# Patient Record
Sex: Male | Born: 1945
Health system: Southern US, Community
[De-identification: ages and names within clinical notes are randomized; demographics above are authoritative.]

## PROBLEM LIST (undated history)

## (undated) DIAGNOSIS — R059 Cough, unspecified: Secondary | ICD-10-CM

## (undated) DIAGNOSIS — K219 Gastro-esophageal reflux disease without esophagitis: Secondary | ICD-10-CM

## (undated) DIAGNOSIS — J45909 Unspecified asthma, uncomplicated: Secondary | ICD-10-CM

## (undated) DIAGNOSIS — K469 Unspecified abdominal hernia without obstruction or gangrene: Secondary | ICD-10-CM

## (undated) DIAGNOSIS — K2270 Barrett's esophagus without dysplasia: Secondary | ICD-10-CM

## (undated) DIAGNOSIS — G4730 Sleep apnea, unspecified: Secondary | ICD-10-CM

## (undated) DIAGNOSIS — D649 Anemia, unspecified: Secondary | ICD-10-CM

## (undated) DIAGNOSIS — C801 Malignant (primary) neoplasm, unspecified: Secondary | ICD-10-CM

## (undated) DIAGNOSIS — G7000 Myasthenia gravis without (acute) exacerbation: Secondary | ICD-10-CM

## (undated) DIAGNOSIS — I10 Essential (primary) hypertension: Secondary | ICD-10-CM

## (undated) DIAGNOSIS — Z8719 Personal history of other diseases of the digestive system: Secondary | ICD-10-CM

## (undated) DIAGNOSIS — R05 Cough: Secondary | ICD-10-CM

## (undated) DIAGNOSIS — R600 Localized edema: Secondary | ICD-10-CM

## (undated) DIAGNOSIS — H269 Unspecified cataract: Secondary | ICD-10-CM

## (undated) DIAGNOSIS — R0602 Shortness of breath: Secondary | ICD-10-CM

## (undated) HISTORY — DX: Localized edema: R60.0

## (undated) HISTORY — PX: BACK SURGERY: SHX140

## (undated) HISTORY — DX: Myasthenia gravis without (acute) exacerbation: G70.00

## (undated) HISTORY — DX: Sleep apnea, unspecified: G47.30

## (undated) HISTORY — PX: EYE SURGERY: SHX253

## (undated) HISTORY — DX: Barrett's esophagus without dysplasia: K22.70

## (undated) HISTORY — DX: Personal history of other diseases of the digestive system: Z87.19

## (undated) HISTORY — DX: Unspecified cataract: H26.9

## (undated) HISTORY — DX: Unspecified asthma, uncomplicated: J45.909

## (undated) HISTORY — DX: Cough, unspecified: R05.9

## (undated) HISTORY — PX: COLONOSCOPY W/ POLYPECTOMY: SHX1380

## (undated) HISTORY — DX: Cough: R05

## (undated) HISTORY — DX: Shortness of breath: R06.02

## (undated) HISTORY — PX: LUMBAR LAMINECTOMY: SHX95

## (undated) HISTORY — DX: Gastro-esophageal reflux disease without esophagitis: K21.9

---

## 2001-02-08 ENCOUNTER — Ambulatory Visit (HOSPITAL_COMMUNITY): Admission: RE | Admit: 2001-02-08 | Discharge: 2001-02-08 | Payer: Self-pay | Admitting: Gastroenterology

## 2001-02-08 ENCOUNTER — Encounter (INDEPENDENT_AMBULATORY_CARE_PROVIDER_SITE_OTHER): Payer: Self-pay | Admitting: *Deleted

## 2004-08-03 ENCOUNTER — Ambulatory Visit: Payer: Self-pay | Admitting: Gastroenterology

## 2006-03-09 ENCOUNTER — Ambulatory Visit: Payer: Self-pay | Admitting: Internal Medicine

## 2007-03-26 ENCOUNTER — Ambulatory Visit: Payer: Self-pay | Admitting: Gastroenterology

## 2007-04-04 ENCOUNTER — Ambulatory Visit: Payer: Self-pay | Admitting: Gastroenterology

## 2007-05-09 ENCOUNTER — Ambulatory Visit: Payer: Self-pay | Admitting: Gastroenterology

## 2007-05-09 LAB — CONVERTED CEMR LAB
Albumin: 3.7 g/dL (ref 3.5–5.2)
Basophils Relative: 1.1 % — ABNORMAL HIGH (ref 0.0–1.0)
Bilirubin, Direct: 0.1 mg/dL (ref 0.0–0.3)
Hemoglobin: 15 g/dL (ref 13.0–17.0)
Lymphocytes Relative: 28 % (ref 12.0–46.0)
MCHC: 35.2 g/dL (ref 30.0–36.0)
Monocytes Absolute: 0.5 10*3/uL (ref 0.2–0.7)
Monocytes Relative: 9.8 % (ref 3.0–11.0)
Neutro Abs: 3.2 10*3/uL (ref 1.4–7.7)
Neutrophils Relative %: 57.7 % (ref 43.0–77.0)
Total Bilirubin: 1 mg/dL (ref 0.3–1.2)

## 2007-05-10 ENCOUNTER — Ambulatory Visit: Payer: Self-pay | Admitting: Gastroenterology

## 2007-05-10 ENCOUNTER — Encounter: Payer: Self-pay | Admitting: Gastroenterology

## 2007-05-16 ENCOUNTER — Ambulatory Visit (HOSPITAL_COMMUNITY): Admission: RE | Admit: 2007-05-16 | Discharge: 2007-05-16 | Payer: Self-pay | Admitting: Gastroenterology

## 2007-06-01 ENCOUNTER — Ambulatory Visit: Payer: Self-pay | Admitting: Gastroenterology

## 2008-05-08 ENCOUNTER — Ambulatory Visit: Payer: Self-pay | Admitting: Gastroenterology

## 2008-05-13 ENCOUNTER — Encounter: Payer: Self-pay | Admitting: Gastroenterology

## 2008-05-13 ENCOUNTER — Ambulatory Visit: Payer: Self-pay | Admitting: Gastroenterology

## 2008-05-16 ENCOUNTER — Encounter: Payer: Self-pay | Admitting: Gastroenterology

## 2008-05-22 ENCOUNTER — Telehealth: Payer: Self-pay | Admitting: Gastroenterology

## 2009-05-08 ENCOUNTER — Ambulatory Visit: Payer: Self-pay | Admitting: Gastroenterology

## 2009-05-08 DIAGNOSIS — K219 Gastro-esophageal reflux disease without esophagitis: Secondary | ICD-10-CM | POA: Insufficient documentation

## 2009-05-08 DIAGNOSIS — R141 Gas pain: Secondary | ICD-10-CM | POA: Insufficient documentation

## 2009-05-08 DIAGNOSIS — K2270 Barrett's esophagus without dysplasia: Secondary | ICD-10-CM

## 2009-05-08 DIAGNOSIS — R143 Flatulence: Secondary | ICD-10-CM

## 2009-05-08 DIAGNOSIS — K5730 Diverticulosis of large intestine without perforation or abscess without bleeding: Secondary | ICD-10-CM | POA: Insufficient documentation

## 2009-05-08 DIAGNOSIS — R142 Eructation: Secondary | ICD-10-CM

## 2009-05-11 ENCOUNTER — Ambulatory Visit: Payer: Self-pay | Admitting: Gastroenterology

## 2009-05-14 ENCOUNTER — Encounter: Payer: Self-pay | Admitting: Gastroenterology

## 2009-05-18 ENCOUNTER — Ambulatory Visit (HOSPITAL_COMMUNITY): Admission: RE | Admit: 2009-05-18 | Discharge: 2009-05-18 | Payer: Self-pay | Admitting: Gastroenterology

## 2009-05-19 ENCOUNTER — Encounter: Payer: Self-pay | Admitting: Gastroenterology

## 2009-06-15 ENCOUNTER — Ambulatory Visit: Payer: Self-pay | Admitting: Gastroenterology

## 2009-06-16 ENCOUNTER — Telehealth: Payer: Self-pay | Admitting: Gastroenterology

## 2009-06-23 ENCOUNTER — Telehealth: Payer: Self-pay | Admitting: Gastroenterology

## 2010-06-01 NOTE — Progress Notes (Signed)
Summary: Medication refill  Medications Added DEXILANT 60 MG CPDR (DEXLANSOPRAZOLE) 1 by mouth once daily       Phone Note Call from Patient Call back at [phone]   Caller: Patient Call For: Dr. Arlyce Dice Reason for Call: Refill Medication Summary of Call: Pt is needing his Dexliant refilled Initial call taken by: Karna Christmas,  June 23, 2009 3:12 PM  Follow-up for Phone Call        Called pt to inform, he wanted 90 day supply sent to his mail order pharmacy Follow-up by: Merri Ray CMA [name]),  June 23, 2009 3:47 PM    New/Updated Medications: DEXILANT 60 MG CPDR (DEXLANSOPRAZOLE) 1 by mouth once daily Prescriptions: DEXILANT 60 MG CPDR (DEXLANSOPRAZOLE) 1 by mouth once daily  #90 x 4   Entered by:   Merri Ray CMA (AAMA)   Authorized by:   Louis Meckel MD   Signed by:   Merri Ray CMA (AAMA) on 06/23/2009   Method used:   Electronically to        [facility]* (mail-order)       [address] [place], [place]  [address]       Ph: [phone]       Fax: [phone]   RxID:   [id number]

## 2010-06-01 NOTE — Letter (Signed)
Summary: Generic Letter  [facility]  [address] [place], [place] [address]   Phone: [phone]  Fax: [phone]    05/19/2009  LELDON STEEGE [address] [place], [place]  [address]  Dear Mr. NGO,  Your gastric emptying scan did not show any remarkable findings.  Please continue with the recommendations previously discussed.  Should you have any further questions or immediate concers, feel free to contact me.  Sincerely,  Barbette Hair. Arlyce Dice, M.D., [facility]            Sincerely,   Melvia Heaps MD  Appended Document: Generic Letter letter mailed

## 2010-06-01 NOTE — Letter (Signed)
Summary: Patient Notice-Barrett's [facility]  [address] [place], [place] [address]   Phone: [phone]  Fax: [phone]        May 14, 2009 MRN: [id number]    Philip [name] [address] [place], [place]  [address]    Dear Mr. YOUNGE,  I am pleased to inform you that the biopsies taken during your recent endoscopic examination did not show any evidence of cancer upon pathologic examination.  However, your biopsies indicate you have a condition known as Barrett's esophagus. While not cancer, it is pre-cancerous (can progress to cancer) and needs to be monitored with repeat endoscopic examination and biopsies.  Fortunately, it is quite rare that this develops into cancer, but careful monitoring of the condition along with taking your medication as prescribed is important in reducing the risk of developing cancer.  It is my recommendation that you have a repeat upper gastrointestinal endoscopic examination in 2_ years.  Additional information/recommendations:  __Please call [phone] to schedule a return visit to further      evaluate your condition.  _x_Continue with treatment plan as outlined the day of your exam.  Please call us if you have or develop heartburn, reflux symptoms, any swallowing problems, or if you have questions about your condition that have not been fully answered at this time.  Sincerely,  Louis Meckel MD  This letter has been electronically signed by your physician.

## 2010-06-01 NOTE — Assessment & Plan Note (Signed)
Summary: F/U ABOUT MEDICATION--CH    History of Present Illness Visit Type: Follow-up Visit Primary GI MD: Melvia Heaps MD [facility] Primary Provider: Jackie Plum, MD Chief Complaint: Med refills/possible endo History of Present Illness:   Philip [name] has returned for followup of his Barrett's esophagus and  esophageal reflux.  Endoscopy one year ago did not demonstrate dysplasia.  His main complaint is chronic coughing due to regurgitation of gastric contents.  This occurs throughout the day.  He denies pyrosis, hoarseness or sore throat.  He remains on Nexium 40 mg QD.   He has requested followup endoscopy this year within the next 2 months since his insurance is going to run out and he will be uninsured for the next 2 years.   GI Review of Systems    Reports acid reflux.      Denies abdominal pain, belching, bloating, chest pain, dysphagia with liquids, dysphagia with solids, heartburn, loss of appetite, nausea, vomiting, vomiting blood, weight loss, and  weight gain.        Denies anal fissure, black tarry stools, change in bowel habit, constipation, diarrhea, diverticulosis, fecal incontinence, heme positive stool, hemorrhoids, irritable bowel syndrome, jaundice, light color stool, liver problems, rectal bleeding, and  rectal pain. Preventive Screening-Counseling & Management  Alcohol-Tobacco     Smoking Status: quit      Drug Use:  no.      Current Medications (verified): 1)  Nexium 40 Mg Cpdr (Esomeprazole Magnesium) .[name].. 1 Capsule By Mouth Once Daily 30 Minutes  Before 1st Meal 2)  Aspirin 81 Mg Tbec (Aspirin) .[name].. 1 By Mouth Once Daily 3)  Amitriptyline Hcl 25 Mg Tabs (Amitriptyline Hcl) .... Once Daily 4)  Fish Oil 1000 Mg Caps (Omega-3 Fatty Acids) .... Once Daily  Allergies (verified): No Known Drug Allergies  Past History:  Past Medical History: Reviewed history from 05/05/2009 and no changes required. Barretts Esophagus Diverticulosis  2002 erosive gastritis  1999  Past Surgical History: Reviewed history from 05/05/2009 and no changes required. Back Surgery  1988  Family History: Reviewed history from 05/05/2009 and no changes required. Family History of Heart Disease: mother  Social History: Occupation: Retired Patient is a former smoker.  Alcohol Use - yes Daily Caffeine Use Illicit Drug Use - no Smoking Status:  quit Drug Use:  no  Review of Systems       The patient complains of arthritis/joint pain, back pain, and cough.    Vital Signs:  Patient profile:   65 year old male Height:      72 inches Weight:      304.25 pounds BMI:     41.41 Pulse rate:   80 / minute Pulse rhythm:   irregular BP sitting:   138 / 76  (left arm) Cuff size:   large  Vitals Entered By: [name] CMA [name]) (May 08, 2009 1:43 PM)  Physical Exam  Additional Exam:  He is a well-developed well-nourished male  skin: anicteric HEENT: normocephalic; PEERLA; no nasal or pharyngeal abnormalities neck: supple nodes: no cervical lymphadenopathy chest: clear to ausculatation and percussion heart: no murmurs, gallops, or rubs abd: soft, nontender; BS normoactive; no abdominal masses, tenderness, organomegaly rectal: deferred ext: no cynanosis, clubbing, edema skeletal: no deformities neuro: oriented x 3; no focal abnormalities    Impression & Recommendations:  Problem # 1:  BARRETTS ESOPHAGUS (ICD-530.85)  Plan repeat endoscopy for surveillance at this time since he would not be able to afford procedure for the next 3 years  Orders: EGD (EGD)  Problem # 2:  ESOPHAGEAL REFLUX (ICD-530.81)  He  has a chronic cough which is likely related to reflux.  Recommendations #1 increase Nexium to b.i.d. #2 gastric emptying scan to rule out gastroparesis  Orders: EGD (EGD)  Other Orders: Gastric Emptying Scan (GES)  Patient Instructions: 1)  cc [name]

## 2010-06-01 NOTE — Progress Notes (Signed)
Summary: Epi-pen  Medications Added EPIPEN 2-PAK 0.3 MG/0.3ML DEVI (EPINEPHRINE) as directed       Phone Note Call from Patient Call back at [phone]   Caller: Patient Call For: Dr. Arlyce Dice Reason for Call: Refill Medication Summary of Call: pt says he was to have an Epipen called into his pharmacy per Dr. Arlyce Dice at yesterday's appt... [facility] [place] Initial call taken by: Vallarie Mare,  June 16, 2009 10:16 AM    New/Updated Medications: EPIPEN 2-PAK 0.3 MG/0.3ML DEVI (EPINEPHRINE) as directed Prescriptions: EPIPEN 2-PAK 0.3 MG/0.3ML DEVI (EPINEPHRINE) as directed  #2 x 0   Entered by:   Merri Ray CMA (AAMA)   Authorized by:   Louis Meckel MD   Signed by:   Merri Ray CMA (AAMA) on 06/16/2009   Method used:   [phone][facility] (retail)       [address]       [place], [place]  [address]       Ph: [phone]       Fax: [phone]   RxID:   [id number]  ---- Converted from flag ---- ---- 06/16/2009 11:27 AM, Louis Meckel MD wrote: I forgot.  Please call one in  ---- 06/16/2009 10:29 AM, Merri Ray CMA (AAMA) wrote: Pt calling saying he was to have an Eppipen called in yesterday?? ------------------------------

## 2010-06-01 NOTE — Procedures (Signed)
Summary: colonoscopy   Colonoscopy  Procedure date:  04/04/2007  Findings:      Results: Diverticulosis.       Location:  [facility].    Comments:      Repeat colonoscopy in 10 years.   Patient Name: Philip, [name] MRN: [id number] Procedure Procedures: Colonoscopy CPT: 29562.  Personnel: Endoscopist: Barbette Hair. Arlyce Dice, MD.  Exam Location: Outpatient  Patient Consent: Procedure, Alternatives, Risks and Benefits discussed, consent obtained, from patient.  Indications  Average Risk Screening Routine.  History  Current Medications: Patient is not currently taking Coumadin.  Pre-Exam Physical: Performed Apr 04, 2007. Cardio-pulmonary exam, HEENT exam , Abdominal exam, Mental status exam WNL.  Comments: Patient history reviewed/updated, physical performed prior to initiation of sedation?YES Exam Exam: Extent of exam reached: Cecum, extent intended: Cecum.  The cecum was identified by IC valve. Time to Cecum: 00:04:16. Time for Withdrawl: 00:11:32. Colon retroflexion performed. ASA Classification: II. Tolerance: good.  Monitoring: Pulse and BP monitoring, Oximetry used. Supplemental O2 given. at 2 Liters.  Colon Prep Used Moviprep for colon prep. Prep results: good.  Sedation Meds: Patient assessed and found to be appropriate for moderate (conscious) sedation. Sedation was managed by the Endoscopist. Fentanyl 100 mcg. given IV. Versed 10 mg. given IV.  Findings - DIVERTICULOSIS: Descending Colon to Sigmoid Colon. ICD9: Diverticulosis: 562.10. Comments: Diffuse diverticulosis.  - NORMAL EXAM: Cecum to Descending Colon.  NORMAL EXAM: Cecum.  - NORMAL EXAM: Sigmoid Colon to Rectum.   Assessment Abnormal examination, see findings above.  Diagnoses: 562.10: Diverticulosis.   Events  Unplanned Interventions: No intervention was required.  Unplanned Events: There were no complications. Plans  Post Exam Instructions: Post sedation instructions  given.  Patient Education: Patient given standard instructions for: Diverticulosis.  Disposition: After procedure patient sent to recovery. After recovery patient sent home.  Scheduling/Referral: Colonoscopy, to Barbette Hair. Arlyce Dice, MD, around Apr 03, 2017.    This report was created from the original endoscopy report, which was reviewed and signed by the above listed endoscopist.

## 2010-06-01 NOTE — Letter (Signed)
Summary: Patient Notice-Barrett's [facility]  [address] [place], [place] [address]   Phone: [phone]  Fax: [phone]        May 14, 2009 MRN: [id number]    [facility] [address] [place], [place]  [address]    Dear Mr. BADIE,  I am pleased to inform you that the biopsies taken during your recent endoscopic examination did not show any evidence of cancer upon pathologic examination.  However, your biopsies indicate you have a condition known as Barrett's esophagus. While not cancer, it is pre-cancerous (can progress to cancer) and needs to be monitored with repeat endoscopic examination and biopsies.  Fortunately, it is quite rare that this develops into cancer, but careful monitoring of the condition along with taking your medication as prescribed is important in reducing the risk of developing cancer.  It is my recommendation that you have a repeat upper gastrointestinal endoscopic examination in 3_ years.  Additional information/recommendations:  __Please call [phone] to schedule a return visit to further      evaluate your condition.  _x_Continue with treatment plan as outlined the day of your exam.  Please call us if you have or develop heartburn, reflux symptoms, any swallowing problems, or if you have questions about your condition that have not been fully answered at this time.  Sincerely,  Louis Meckel MD  This letter has been electronically signed by your physician.  Appended Document: Patient Notice-Barrett's Esopghagus Letter mailed 01.13.11

## 2010-06-01 NOTE — Procedures (Signed)
Summary: Upper Endoscopy  Patient: Philip [name] Note: All result statuses are Final unless otherwise noted.  Tests: (1) Upper Endoscopy (EGD)   EGD Upper Endoscopy       DONE     [facility]     [address]     [place], [place]  [address]           ENDOSCOPY PROCEDURE REPORT           PATIENT:  Philip [name], Philip [name]  MR#:  [id number]     BIRTHDATE:  24-Nov-1945, 63 yrs. old  GENDER:  male           ENDOSCOPIST:  Barbette Hair. Arlyce Dice, MD     Referred by:           PROCEDURE DATE:  05/11/2009     PROCEDURE:  EGD with biopsy     ASA CLASS:  Class II     INDICATIONS:  h/o Barrett's Esophagus           MEDICATIONS:   Fentanyl 75 mcg IV, Versed 7 mg IV, glycopyrrolate     (Robinal) 0.2 mg IV, 0.6cc simethancone 0.6 cc PO     TOPICAL ANESTHETIC:  Exactacain Spray           DESCRIPTION OF PROCEDURE:   After the risks benefits and     alternatives of the procedure were thoroughly explained, informed     consent was obtained.  The LB [id number]-H180 [id number] endoscope was     introduced through the mouth and advanced to the third portion of     the duodenum, without limitations.  The instrument was slowly     withdrawn as the mucosa was fully examined.     <<PROCEDUREIMAGES>>           There were multiple polyps identified. in the antrum. Multiple     5-51mm sessile polyps with erosions at tip (see image1 and     image2). Bx taken  Barrett's esophagus was found at the     gastroesophageal junction. Short segment Barrett's esophagus.     Multiple biopsies were obtained and sent to pathology (see image3     and image4).  Otherwise the examination was normal.    Retroflexed     views revealed no abnormalities.    The scope was then withdrawn     from the patient and the procedure completed.           COMPLICATIONS:  None           ENDOSCOPIC IMPRESSION:     1) Polyps, multiple in the antrum     2) Barrett's esophagus at the gastroesophageal junction     3) Otherwise normal examination    RECOMMENDATIONS:     1) await biopsy results     2) continue current medications     3) Call office next 2-3 days to schedule an office appointment     for 1 month           REPEAT EXAM:  In 3 year(s) for EGD.           ______________________________     Barbette Hair. Arlyce Dice, MD           CC:  Jackie Plum MD           n.     Rosalie DoctorBarbette Hair. Majel Giel at 05/11/2009 03:46 PM           Durrell, Laural Benes, [phone]  Note: An exclamation mark (!) indicates a result that was not dispersed into the flowsheet. Document Creation Date: 05/11/2009 3:46 PM _______________________________________________________________________  (1) Order result status: Final Collection or observation date-time: 05/11/2009 15:39 Requested date-time:  Receipt date-time:  Reported date-time:  Referring Physician:   Ordering Physician: Melvia Heaps [phone]) Specimen Source:  Source: Launa Grill Order Number: [id number] Lab site:   Appended Document: Upper Endoscopy     Procedures Next Due Date:    Colonoscopy: 05/2012

## 2010-06-01 NOTE — Assessment & Plan Note (Signed)
Summary: FOLLOW UP FROM EGD--CH.    History of Present Illness Visit Type: Follow-up Visit Primary GI MD: Melvia Heaps MD [facility] Primary Provider: Jackie Plum, MD Chief Complaint: F/U EGD, patient's  symptoms have  improved History of Present Illness:   Mr. Hollar past return for followup of his reflux.  On twice a day Nexium his cough is fairly well controlled.  Biopsies of his Barrett's esophagus did not reveal dysplasia.  Gastric emptying scan was normal.   GI Review of Systems      Denies abdominal pain, acid reflux, belching, bloating, chest pain, dysphagia with liquids, dysphagia with solids, heartburn, loss of appetite, nausea, vomiting, vomiting blood, weight loss, and  weight gain.        Denies anal fissure, black tarry stools, change in bowel habit, constipation, diarrhea, diverticulosis, fecal incontinence, heme positive stool, hemorrhoids, irritable bowel syndrome, jaundice, light color stool, liver problems, rectal bleeding, and  rectal pain.    Current Medications (verified): 1)  Nexium 40 Mg Cpdr (Esomeprazole Magnesium) .[name].. 1 Capsule By Mouth Two Times A Day 2)  Aspirin 81 Mg Tbec (Aspirin) .[name].. 1 By Mouth Once Daily 3)  Amitriptyline Hcl 25 Mg Tabs (Amitriptyline Hcl) .... Once Daily 4)  Fish Oil 1000 Mg Caps (Omega-3 Fatty Acids) .... Once Daily 5)  Doxycycline Hyclate 100 Mg Caps (Doxycycline Hyclate) .... Take 1 Capsule Two Times A Day For Seven Days 6)  Hydrochlorothiazide 25 Mg Tabs (Hydrochlorothiazide) .... Once Daily 7)  Hydrocodone-Acetaminophen 5-500 Mg Tabs (Hydrocodone-Acetaminophen) .... As Needed  Allergies (verified): No Known Drug Allergies  Past History:  Past Medical History: Reviewed history from 05/05/2009 and no changes required. Barretts Esophagus Diverticulosis  2002 erosive gastritis 1999  Past Surgical History: Reviewed history from 05/05/2009 and no changes required. Back Surgery  1988  Family History: Reviewed history  from 05/05/2009 and no changes required. Family History of Heart Disease: mother  Social History: Reviewed history from 05/08/2009 and no changes required. Occupation: Retired Patient is a former smoker.  Alcohol Use - yes Daily Caffeine Use Illicit Drug Use - no  Review of Systems       The patient complains of back pain, cough, muscle pains/cramps, shortness of breath, sore throat, and voice change.    Vital Signs:  Patient profile:   65 year old male Height:      72 inches Weight:      309 pounds BMI:     42.06 Pulse rate:   64 / minute Pulse rhythm:   irregular BP sitting:   112 / 68  (left arm) Cuff size:   regular  Vitals Entered By: [name] CMA [name]) (June 15, 2009 3:17 PM)    Impression & Recommendations:  Problem # 1:  ESOPHAGEAL REFLUX (ICD-530.81) Symptoms are well-controlled on b.i.d. Nexium.  Recommendations #1 I have given the patient samples of DEXILANT to try once daily in lieu of a twice a day PPI.  Patient will decide which he prefers.  Problem # 2:  BARRETTS ESOPHAGUS (ICD-530.85) Plan followup endoscopy in 3 years  Anticoagulation Management Assessment/Plan:            Patient Instructions: 1)  Dexilant samples given 2)  The medication list was reviewed and reconciled.  All changed / newly prescribed medications were explained.  A complete medication list was provided to the patient / caregiver. 3)  cc [name]

## 2010-06-01 NOTE — Letter (Signed)
Summary: EGD Instructions  [facility]  [address] [place], [place] [address]   Phone: [phone]  Fax: [phone]       Philip [name]    Oct 25, 1945    MRN: [id number]       Procedure Day /Date:MONDAY 05/11/2009     Arrival Time: 2:00PM     Procedure Time:3:00PM     Location of Procedure:                    [facility] (4th Floor)   PREPARATION FOR ENDOSCOPY   On 05/11/2009 THE DAY OF THE PROCEDURE:  1.   No solid foods, milk or milk products are allowed after midnight the night before your procedure.  2.   Do not drink anything colored red or purple.  Avoid juices with pulp.  No orange juice.  3.  You may drink clear liquids until1:00PM, which is 2 hours before your procedure.                                                                                                CLEAR LIQUIDS INCLUDE: Water Jello Ice Popsicles Tea (sugar ok, no milk/cream) Powdered fruit flavored drinks Coffee (sugar ok, no milk/cream) Gatorade Juice: apple, white grape, white cranberry  Lemonade Clear bullion, consomm, broth Carbonated beverages (any kind) Strained chicken noodle soup Hard Candy   MEDICATION INSTRUCTIONS  Unless otherwise instructed, you should take regular prescription medications with a small sip of water as early as possible the morning of your procedure.               OTHER INSTRUCTIONS  You will need a responsible adult at least 64 years of age to accompany you and drive you home.   This person must remain in the waiting room during your procedure.  Wear loose fitting clothing that is easily removed.  Leave jewelry and other valuables at home.  However, you may wish to bring a book to read or an iPod/MP3 player to listen to music as you wait for your procedure to start.  Remove all body piercing jewelry and leave at home.  Total time from sign-in until discharge is approximately 2-3 hours.  You should go home  directly after your procedure and rest.  You can resume normal activities the day after your procedure.  The day of your procedure you should not:   Drive   Make legal decisions   Operate machinery   Drink alcohol   Return to work  You will receive specific instructions about eating, activities and medications before you leave.    The above instructions have been reviewed and explained to me by   _______________________    I fully understand and can verbalize these instructions _____________________________ Date _________

## 2010-09-14 NOTE — Assessment & Plan Note (Signed)
[facility]                         GASTROENTEROLOGY OFFICE NOTE   NAME:Messinger, DAISHON CHUI                      MRN:          [id number]  DATE:05/09/2007                            DOB:          [birth year]    PROBLEM:  Abdominal pain.   REASON:  Mr. Goswami has returned for reevaluation.  He has been  complaining of right upper quadrant pain.  This is a fairly constant  problem.  It is lessened with eating.  He has frequent pyrosis unless he  takes his daily Nexium.  He occasionally has regurgitation of gastric  contents.  He denies nausea.  Recent colonoscopy demonstrated  diverticulosis.  He describes the pain as a fairly sharp pain without  radiation.   MEDICATIONS:  Amitriptyline, Nexium and baby aspirin.   PHYSICAL EXAMINATION:  Pulse 100, blood pressure 164/76, weight 306.  HEENT:  EOMI.  PERRLA.  Sclerae are anicteric.  Conjunctivae are pink.  NECK:  Supple without thyromegaly, adenopathy or carotid bruits.  CHEST:  Clear to auscultation and percussion without adventitious  sounds.  CARDIAC:  Regular rhythm; normal S1 S2.  There are no murmurs, gallops  or rubs.  ABDOMEN:  He has mild right upper quadrant tenderness to palpation  without guarding or rebound.  There are no abdominal masses or  organomegaly.  EXTREMITIES:  Full range of motion.  No cyanosis, clubbing or edema.  RECTAL:  Deferred.   IMPRESSION:  1. Persistent upper abdominal pain, mostly in the right upper      quadrant.  The pain could be due to chronic cholecystitis.  Ulcer      and non-ulcer dyspepsia are other considerations.  2. Gastroesophageal reflux disease.  Barrett's esophagus ought to be      ruled out.   RECOMMENDATION:  1. The patient is instructed to take his Nexium before dinner instead      of at bedtime.  2. Upper endoscopy.  3. Abdominal ultrasound, if endoscopy is not diagnostic for source for      pain.  4. Check LFTs.     Barbette Hair. Arlyce Dice,  MD,FACG  Electronically Signed    RDK/MedQ  DD: 05/09/2007  DT: 05/09/2007  Job #: [id number]   cc:   Dr. Maryella Shivers

## 2010-09-14 NOTE — Assessment & Plan Note (Signed)
[facility]                         GASTROENTEROLOGY OFFICE NOTE   NAME:Philip [name], Philip [name]                      MRN:          [id number]  DATE:06/01/2007                            DOB:          1945/07/17    PROBLEM:  Abdominal pain.   [name] has returned for scheduled GI followup.  Upper abdominal pain  has essentially subsided.  He is taking Nexium 40 mg a day.  Endoscopy  demonstrated erosive gastritis and changes in the esophagus that proved  to be Barrett's esophagus.  No dysplasia was seen.   ON EXAM:  Pulse 84, blood pressure 118/68, weight 306.   IMPRESSION:  1. Abdominal pain, likely secondary to erosive gastritis, resolved.  2. Barrett's esophagus.   RECOMMENDATIONS:  1. Continue PPI therapy indefinitely.  2. Followup endoscopy in approximately one year.     Barbette Hair. Arlyce Dice, MD,FACG  Electronically Signed    RDK/MedQ  DD: 06/01/2007  DT: 06/01/2007  Job #: [id number]

## 2010-09-17 NOTE — Assessment & Plan Note (Signed)
[facility]                               PULMONARY OFFICE NOTE   NAME:[name] [name] SIMPSON                      MRN:          [id number]  DATE:03/09/2006                            DOB:          07-19-1945    REFERRING PHYSICIAN:  Maryella Shivers, M.D.   CHIEF COMPLAINT:  Dyspnea.   HISTORY:  This is a 65 year old white male who quit smoking in 1978 and  weighed approximately 220 pounds and with no respiratory complaints. Since  that time, he has had a progressive weight gain, but over the last two years  especially, another 20 pounds onto has been added onto his baseline and is  up to 300 pounds now. He had an episode of severe dyspnea that occurred  while in [place] after doing some cave diving and carrying his heavy diving  equipment over a parking lot. This led to a hospitalization in [place] where  he was evaluated for clots in his legs, because he also at the same time had  a swollen left leg with chills, and turned he had a leg infection and  has been treated there. After a trip to [place], he was evaluated at [facility]. However, subsequently, he continues to be short of breath just  walking across a parking lot to work and this he feels is much different  than his baseline and therefore, is seen at Dr. Lynnea Ferrier request.   The patient also has noticed some minimal dry cough associated with dyspnea  with intermittent heartburn symptoms, no overt sinus symptoms or excess  sputum production. Chills, sweats, orthopnea PND and leg swelling is no  longer a problem.   PAST MEDICAL HISTORY:  Significant for obesity and gastroesophageal reflux  disease. He is also status post remote lumbar surgery.   ALLERGIES:  None known.   MEDICATIONS:  Amitriptyline 25 mg daily.   SOCIAL HISTORY:  He quit smoking in 1978. He works as a Naval architect.   FAMILY HISTORY:  Positive for heart disease in his mother and lung cancer in  his father.   REVIEW OF  SYSTEMS:  Taken in detail on the worksheet, significant for the  problems outlined above.   PHYSICAL EXAMINATION:  GENERAL: This is an obese, ambulatory white male in  no acute distress.  VITAL SIGNS: Stable.  HEENT: Reveals upper dentures, airway is adequate, nasal turbinates normal,  ear canals clear bilaterally.  NECK: Supple without cervical adenopathy or tenderness.  Trachea is midline.  No thyromegaly.  LUNGS: Fields reveal minimal pseudo-wheeze.  Air movement is adequate.  CARDIAC: Regular rate and rhythm without murmur, gallop or rub present.  ABDOMEN: Soft and benign, but obese with an end-inspiratory Hoover sign.  EXTREMITIES: Warm without calf tenderness, cyanosis, clubbing, or edema.   IMPRESSION:  Morbid obesity with approximately 80 pounds of weight gain  since he stopped smoking with no symptoms in 1978. Although he may well have  chronic obstructive pulmonary disease, I do not think it is his main problem  and doubt that aggressive treatment directed at chronic obstructive  pulmonary disease will really help  him. I do think that many patients with  morbid obesity develop secondary reflux and this is easily confused with  lower respiratory symptoms. Therefore, I recommended that he take his Nexium  perfectly regularly, 30 minutes to 60 minutes before breakfast and then  return here for pulmonary function tests at 4-6 weeks.   In the meantime, I have reviewed with him an exercise program with 30  minutes of walking or bicycling daily at a level where he is short of breath  but never out of breath. He says he does not think he can do this with his  schedule, but will take it into consideration.   I will note parenthetically, that the differential diagnosis does include  heart disease, as well as well deep vein thrombosis or pulmonary embolism,  but I assumed that when he said he had every test in the book at [facility],  he means it and will obtain, therefore, all the  studies done there before  considering additional studies here.    ______________________________  Charlaine Dalton. Sherene Sires, MD, [facility]    MBW/MedQ  DD: 03/09/2006  DT: 03/10/2006  Job #: [id number]

## 2011-02-09 ENCOUNTER — Telehealth: Payer: Self-pay | Admitting: Gastroenterology

## 2011-02-10 NOTE — Telephone Encounter (Signed)
Pt aware.

## 2011-02-10 NOTE — Telephone Encounter (Signed)
Pt is taking Nexium 40mg  daily and states it is to expensive for him and would like to start taking prilosec. He is wanting to take the OTC prilosec but wants to know how much/dosage he should take. Please advise.

## 2011-02-10 NOTE — Telephone Encounter (Signed)
He should take omeprazole 20mg  qd

## 2011-11-13 ENCOUNTER — Other Ambulatory Visit: Payer: Self-pay | Admitting: Gastroenterology

## 2012-05-10 ENCOUNTER — Encounter: Payer: Self-pay | Admitting: Gastroenterology

## 2012-06-01 ENCOUNTER — Encounter: Payer: Self-pay | Admitting: Gastroenterology

## 2012-06-12 ENCOUNTER — Telehealth: Payer: Self-pay

## 2012-06-12 ENCOUNTER — Ambulatory Visit (AMBULATORY_SURGERY_CENTER): Payer: Medicare Other

## 2012-06-12 DIAGNOSIS — K2270 Barrett's esophagus without dysplasia: Secondary | ICD-10-CM

## 2012-06-12 NOTE — Telephone Encounter (Signed)
Pt thinks he is also due for colonoscopy.  5 yr f/u.

## 2012-06-12 NOTE — Telephone Encounter (Signed)
Negative colon in 2008.  If no family history of colon ca and no symptoms he does not need routine f/u colo until 2018.

## 2012-06-26 ENCOUNTER — Ambulatory Visit (AMBULATORY_SURGERY_CENTER): Payer: Medicare Other | Admitting: Gastroenterology

## 2012-06-26 ENCOUNTER — Encounter: Payer: Self-pay | Admitting: Gastroenterology

## 2012-06-26 VITALS — BP 153/89 | HR 82 | Temp 98.2°F | Resp 18 | Ht 72.0 in | Wt 309.0 lb

## 2012-06-26 DIAGNOSIS — K319 Disease of stomach and duodenum, unspecified: Secondary | ICD-10-CM

## 2012-06-26 DIAGNOSIS — K2270 Barrett's esophagus without dysplasia: Secondary | ICD-10-CM

## 2012-06-26 DIAGNOSIS — K2970 Gastritis, unspecified, without bleeding: Secondary | ICD-10-CM

## 2012-06-26 MED ORDER — SODIUM CHLORIDE 0.9 % IV SOLN: 500.0000 mL | INTRAVENOUS | Status: DC

## 2012-06-26 NOTE — Op Note (Signed)
[facility] [address] [place], [address]   ENDOSCOPY PROCEDURE REPORT  PATIENT: Philip [name], Philip [name]  MR#: [id number] BIRTHDATE: 11/25/1945 , 67  yrs. old GENDER: Male ENDOSCOPIST: Louis Meckel, MD REFERRED BY:  Olivia Canter, M.D. PROCEDURE DATE:  06/26/2012 PROCEDURE:  EGD w/ biopsy ASA CLASS:     Class II INDICATIONS:  follow up of Barrett's esophagus. MEDICATIONS: MAC sedation, administered by CRNA, propofol (Diprivan) 150mg  IV, Robinul 0.2 mg IV, and Simethicone 0.6cc PO TOPICAL ANESTHETIC: Cetacaine Spray  DESCRIPTION OF PROCEDURE: After the risks benefits and alternatives of the procedure were thoroughly explained, informed consent was obtained.  The LB GIF-H180 [id number] endoscope was introduced through the mouth and advanced to the third portion of the duodenum. Without limitations.  The instrument was slowly withdrawn as the mucosa was fully examined.      In the second portion of the duodenal bulb there is a 4-a5 mm submucosal nodule with no normal overlying mucosa. Again noted in the distal esophagus wasn't irregular E.  junction with prior biopsies demonstrating Barrett's esophagus.  Multiple biopsies were taken to rule out dysplasia.   In the second portion of the duodenal bulb there is a 4-a5 mm submucosal nodule with no normal overlying mucosa. Again noted in the distal esophagus wasn't irregular E.  junction with prior biopsies demonstrating Barrett's esophagus.  Multiple biopsies were taken to rule out dysplasia.   The remainder of the upper endoscopy exam was otherwise normal.  Retroflexed views revealed no abnormalities.     The scope was then withdrawn from the patient and the procedure completed.  COMPLICATIONS: There were no complications. ENDOSCOPIC IMPRESSION: 1.  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  gastritis 3.  Barrett's esophagus  RECOMMENDATIONS: Await biopsy results and REPEAT  EXAM:  eSigned:  Louis Meckel, MD 06/26/2012 2:10 PM   CC:

## 2012-06-26 NOTE — Patient Instructions (Addendum)
YOU HAD AN ENDOSCOPIC PROCEDURE TODAY AT THE [facility]: Refer to the procedure report that was given to you for any specific questions about what was found during the examination.  If the procedure report does not answer your questions, please call your gastroenterologist to clarify.  If you requested that your care partner not be given the details of your procedure findings, then the procedure report has been included in a sealed envelope for you to review at your convenience later.  YOU SHOULD EXPECT: Some feelings of bloating in the abdomen. Passage of more gas than usual.  Walking can help get rid of the air that was put into your GI tract during the procedure and reduce the bloating. If you had a lower endoscopy (such as a colonoscopy or flexible sigmoidoscopy) you may notice spotting of blood in your stool or on the toilet paper. If you underwent a bowel prep for your procedure, then you may not have a normal bowel movement for a few days.  DIET: Your first meal following the procedure should be a light meal and then it is ok to progress to your normal diet.  A half-sandwich or bowl of soup is an example of a good first meal.  Heavy or fried foods are harder to digest and may make you feel nauseous or bloated.  Likewise meals heavy in dairy and vegetables can cause extra gas to form and this can also increase the bloating.  Drink plenty of fluids but you should avoid alcoholic beverages for 24 hours.  ACTIVITY: Your care partner should take you home directly after the procedure.  You should plan to take it easy, moving slowly for the rest of the day.  You can resume normal activity the day after the procedure however you should NOT DRIVE or use heavy machinery for 24 hours (because of the sedation medicines used during the test).    SYMPTOMS TO REPORT IMMEDIATELY: A gastroenterologist can be reached at any hour.  During normal business hours, 8:30 AM to 5:00 PM Monday through Friday,  call [phone].  After hours and on weekends, please call the GI answering service at [phone] who will take a message and have the physician on call contact you.  Following upper endoscopy (EGD)  Vomiting of blood or coffee ground material  New chest pain or pain under the shoulder blades  Painful or persistently difficult swallowing  New shortness of breath  Fever of 100F or higher  Black, tarry-looking stools  FOLLOW UP: If any biopsies were taken you will be contacted by phone or by letter within the next 1-3 weeks.  Call your gastroenterologist if you have not heard about the biopsies in 3 weeks.  Our staff will call the home number listed on your records the next business day following your procedure to check on you and address any questions or concerns that you may have at that time regarding the information given to you following your procedure. This is a courtesy call and so if there is no answer at the home number and we have not heard from you through the emergency physician on call, we will assume that you have returned to your regular daily activities without incident.  SIGNATURES/CONFIDENTIALITY: You and/or your care partner have signed paperwork which will be entered into your electronic medical record.  These signatures attest to the fact that that the information above on your After Visit Summary has been reviewed and is understood.  Full responsibility of   the confidentiality of this discharge information lies with you and/or your care-partner. 

## 2012-06-26 NOTE — Progress Notes (Signed)
Lidocaine-40mg IV prior to Propofol InductionPropofol given over incremental dosages 

## 2012-06-26 NOTE — Progress Notes (Addendum)
Patient did not have preoperative order for IV antibiotic SSI prophylaxis. [phone])  Patient did not experience any of the following events: a burn prior to discharge; a fall within the facility; wrong site/side/patient/procedure/implant event; or a hospital transfer or hospital admission upon discharge from the facility. [phone])  Kindle given to patient as well as dentures and glasses.

## 2012-06-27 ENCOUNTER — Telehealth: Payer: Self-pay

## 2012-06-27 NOTE — Telephone Encounter (Signed)
  Follow up Call-  Call back number 06/26/2012  Post procedure Call Back phone  # [phone]  Permission to leave phone message Yes     Patient questions:  Do you have a fever, pain , or abdominal swelling? no Pain Score  0 *  Have you tolerated food without any problems? yes  Have you been able to return to your normal activities? yes  Do you have any questions about your discharge instructions: Diet   no Medications  no Follow up visit  no  Do you have questions or concerns about your Care? no  Actions: * If pain score is 4 or above: No action needed, pain <4.

## 2012-07-04 ENCOUNTER — Encounter: Payer: Self-pay | Admitting: Gastroenterology

## 2014-12-11 ENCOUNTER — Encounter (HOSPITAL_BASED_OUTPATIENT_CLINIC_OR_DEPARTMENT_OTHER): Payer: Self-pay

## 2014-12-11 ENCOUNTER — Emergency Department (HOSPITAL_BASED_OUTPATIENT_CLINIC_OR_DEPARTMENT_OTHER)
Admission: EM | Admit: 2014-12-11 | Discharge: 2014-12-11 | Disposition: A | Discharge status: Home / Self Care | Payer: Medicare Other | Attending: Emergency Medicine | Admitting: Emergency Medicine

## 2014-12-11 DIAGNOSIS — Z8719 Personal history of other diseases of the digestive system: Secondary | ICD-10-CM | POA: Diagnosis not present

## 2014-12-11 DIAGNOSIS — I83891 Varicose veins of right lower extremities with other complications: Secondary | ICD-10-CM | POA: Diagnosis present

## 2014-12-11 DIAGNOSIS — Z79899 Other long term (current) drug therapy: Secondary | ICD-10-CM | POA: Diagnosis not present

## 2014-12-11 HISTORY — DX: Unspecified abdominal hernia without obstruction or gangrene: K46.9

## 2014-12-11 NOTE — ED Notes (Signed)
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

## 2014-12-11 NOTE — Discharge Instructions (Signed)
Bleeding Varicose Veins Varicose veins are veins that have become enlarged and twisted. Valves in the veins help return blood from the leg to the heart. If these valves are damaged, blood flows backwards and backs up into the veins in the leg near the skin. This causes the veins to become larger because of increased pressure within. Sometimes these veins bleed. CAUSES  Factors that can lead to bleeding varicose veins include:  Thinning of the skin that covers the veins. This skin is stretched as the veins enlarge.  Weak and thinning walls of the varicose veins. These thin walls are part of the reason why blood is not flowing normally to the heart.  Having high pressure in the veins. This high pressure occurs because the blood is not flowing freely back up to the heart.  Injury. Even a small injury to a varicose vein can cause bleeding.  Open wounds. A sore may develop near a varicose vein and not heal. This makes bleeding more likely.  Taking medicine that thins the blood. These medicines may include aspirin, anti-inflammatory medicine, and other blood thinners. SYMPTOMS  If bleeding is on the outside surface of the skin, blood can be seen. Sometimes, the bleeding stays under the skin. If this happens, the blue or purple area will spread beyond the vein. This discoloration may be visible. DIAGNOSIS  To decide if you have a bleeding varicose vein, your caregiver may:  Ask about your symptoms. This will include when you first saw bleeding.  Ask about how long you have had varicose veins and if they cause you problems.  Ask about your overall health.  Ask about possible causes, like recent cuts or if the area near the varicose veins was bumped or injured.  Examine the skin or leg that concerns you. Your caregiver will probably feel the veins.  Order imaging tests. These create detailed pictures of the veins. TREATMENT  The first goal of treating bleeding varicose veins is to stop the  bleeding. Then, the aim is to keep any bleeding from happening again. Treatment will depend on the cause of the bleeding and how bad it is. Ask your caregiver about what would be best for you. Options include:  Raising (elevating) your leg. Lie down with your leg propped up on a pillow or cushion. Your foot should be above your heart.  Applying pressure to the spot that is bleeding. The bleeding should stop in a short time.  Wearing elastic stocking that "compress" your legs (compression stockings). An elastic bandage may do the same thing.  Applying an antibiotic cream on sores that are not healing.  Surgically removing or closing off the bleeding varicose veins. HOME CARE INSTRUCTIONS   Apply any creams that your caregiver prescribed. Follow the directions carefully.  Wear compression stockings or any special wraps that were prescribed. Make sure you know:  If you should wear them every day.  How long you should wear them.  If veins were removed or closed, a bandage (dressing) will probably cover the area. Make sure you know:  How often the dressing should be changed.  Whether the area can get wet.  When you can leave the skin uncovered.  Check your skin every day. Look for new sores and signs of bleeding.  To prevent future bleeding:  Use extra care in situations where you could cut your legs. Shaving, for example, or working outside in the garden.  Try to keep your legs elevated as much as possible. Lie down  when you can. SEEK MEDICAL CARE IF:   You have any questions about how to wear compression stockings or elastic bandages.  Your veins continue to bleed.  Sores develop near your varicose veins.  You have a sore that does not heal or gets bigger.  Pain increases in your leg.  The area around a varicose vein becomes warm, red, or tender to the touch.  You notice a yellowish fluid that smells bad coming from a spot where there was bleeding.  You develop a  fever of more than 100.5 F (38.1 C). SEEK IMMEDIATE MEDICAL CARE IF:   You develop a fever of more than 102 F (38.9 C). Document Released: 09/04/2008 Document Revised: 07/11/2011 Document Reviewed: 08/20/2013 [facility] Patient Information 2015 [place], [place]. This information is not intended to replace advice given to you by your health care provider. Make sure you discuss any questions you have with your health care provider.

## 2014-12-11 NOTE — ED Provider Notes (Addendum)
CSN: [id number]     Arrival date & time 12/11/14  0446 History   First MD Initiated Contact with Patient 12/11/14 0501     Chief Complaint  Patient presents with  . Bleeding Varicose Vein      (Consider location/radiation/quality/duration/timing/severity/associated sxs/prior Treatment) HPI  This is a 69 year old male who was working in his shop this morning. When he finished and was taking off issues he noticed he had a bleeding varicose vein on his right lower shin. Bleeding was brisk and was flowing in a stream. EMS placed a pressure dressing at his house and he came to the ED by private vehicle. The wound has subsequently stopped bleeding. He denies injury or pain associated with this.  Past Medical History  Diagnosis Date  . Fluid retention in legs   . Abdominal hernia    Past Surgical History  Procedure Laterality Date  . Lumbar laminectomy      1980's   Family History  Problem Relation Age of Onset  . Colon cancer Neg Hx   . Heart disease Mother    Social History  Substance Use Topics  . Smoking status: Never Smoker   . Smokeless tobacco: Never Used  . Alcohol Use: Yes     Comment: GLASS WINE A NIGHT    Review of Systems  All other systems reviewed and are negative.   Allergies  Review of patient's allergies indicates no known allergies.  Home Medications   Prior to Admission medications   Medication Sig Start Date End Date Taking? Authorizing Provider  amitriptyline (ELAVIL) 25 MG tablet  04/01/12   Historical Provider, MD  diclofenac (VOLTAREN) 75 MG EC tablet  06/12/12   Historical Provider, MD  hydrochlorothiazide (HYDRODIURIL) 25 MG tablet Take 25 mg by mouth daily.    Historical Provider, MD  lisinopril-hydrochlorothiazide (PRINZIDE,ZESTORETIC) 20-25 MG per tablet  03/23/12   Historical Provider, MD  NEXIUM 40 MG capsule TAKE ONE CAPSULE BY MOUTH EVERY DAY 11/13/11   Inda Castle, MD  traMADol (ULTRAM) 50 MG tablet Take 50 mg by mouth every 6 (six)  hours as needed for pain.    Historical Provider, MD  VENTOLIN HFA 108 (90 BASE) MCG/ACT inhaler  03/23/12   Historical Provider, MD   BP 129/69 mmHg  Pulse 104  Temp(Src) 98.6 F (37 C) (Oral)  Resp 20  Ht 6' (1.829 m)  Wt 293 lb 9 oz (133.159 kg)  BMI 39.81 kg/m2  SpO2 99%   Physical Exam  General: Well-developed, well-nourished male in no acute distress; appearance consistent with age of record HENT: normocephalic; atraumatic Eyes: Normal appearance Neck: supple Heart: regular rate and rhythm Lungs: Normal respiratory effort and excursion Abdomen: soft; nondistended Extremities: No deformity; full range of motion; pulses normal Neurologic: Awake, alert and oriented; motor function intact in all extremities and symmetric; no facial droop Skin: Warm and dry; there are skinof lower legs, puncture wound of varicose vein right lower shin that is hemostatic Psychiatric: Normal mood and affect    ED Course  Procedures (including critical care time)  The puncture site was sealed with Dermabond and a pressure dressing applied. He was advised to keep his leg elevated for at least 24 hours.  MDM    Shanon Rosser, MD 12/11/14 [place], MD 12/11/14 [phone]

## 2015-04-29 ENCOUNTER — Encounter: Payer: Self-pay | Admitting: Gastroenterology

## 2015-06-12 ENCOUNTER — Encounter: Payer: Self-pay | Admitting: Gastroenterology

## 2015-07-14 ENCOUNTER — Ambulatory Visit (AMBULATORY_SURGERY_CENTER): Payer: Self-pay

## 2015-07-14 VITALS — Ht 72.0 in | Wt 298.6 lb

## 2015-07-14 DIAGNOSIS — K2270 Barrett's esophagus without dysplasia: Secondary | ICD-10-CM

## 2015-07-14 NOTE — Progress Notes (Signed)
Per pt, no allergies to soy or egg products.Pt not taking any weight loss meds or using  O2 at home. 

## 2015-07-29 ENCOUNTER — Ambulatory Visit (AMBULATORY_SURGERY_CENTER): Payer: Medicare Other | Admitting: Gastroenterology

## 2015-07-29 ENCOUNTER — Encounter: Payer: Self-pay | Admitting: Gastroenterology

## 2015-07-29 VITALS — BP 125/70 | HR 75 | Temp 97.6°F | Resp 15 | Ht 72.0 in | Wt 298.0 lb

## 2015-07-29 DIAGNOSIS — K2270 Barrett's esophagus without dysplasia: Secondary | ICD-10-CM

## 2015-07-29 MED ORDER — SODIUM CHLORIDE 0.9 % IV SOLN: 500.0000 mL | INTRAVENOUS | Status: DC

## 2015-07-29 NOTE — Patient Instructions (Signed)
Biopsies taken today. Handout given on Barrett's. Result letter will come to your mail in about 2-3 weeks.  Resume current medications. Call us with any questions or concerns. Thank you!!  YOU HAD AN ENDOSCOPIC PROCEDURE TODAY AT [facility]:   Refer to the procedure report that was given to you for any specific questions about what was found during the examination.  If the procedure report does not answer your questions, please call your gastroenterologist to clarify.  If you requested that your care partner not be given the details of your procedure findings, then the procedure report has been included in a sealed envelope for you to review at your convenience later.  YOU SHOULD EXPECT: Some feelings of bloating in the abdomen. Passage of more gas than usual.  Walking can help get rid of the air that was put into your GI tract during the procedure and reduce the bloating. If you had a lower endoscopy (such as a colonoscopy or flexible sigmoidoscopy) you may notice spotting of blood in your stool or on the toilet paper. If you underwent a bowel prep for your procedure, you may not have a normal bowel movement for a few days.  Please Note:  You might notice some irritation and congestion in your nose or some drainage.  This is from the oxygen used during your procedure.  There is no need for concern and it should clear up in a day or so.  SYMPTOMS TO REPORT IMMEDIATELY:    Following upper endoscopy (EGD)  Vomiting of blood or coffee ground material  New chest pain or pain under the shoulder blades  Painful or persistently difficult swallowing  New shortness of breath  Fever of 100F or higher  Black, tarry-looking stools  For urgent or emergent issues, a gastroenterologist can be reached at any hour by calling [phone].   DIET: Your first meal following the procedure should be a small meal and then it is ok to progress to your normal diet. Heavy or fried foods are  harder to digest and may make you feel nauseous or bloated.  Likewise, meals heavy in dairy and vegetables can increase bloating.  Drink plenty of fluids but you should avoid alcoholic beverages for 24 hours.  ACTIVITY:  You should plan to take it easy for the rest of today and you should NOT DRIVE or use heavy machinery until tomorrow (because of the sedation medicines used during the test).    FOLLOW UP: Our staff will call the number listed on your records the next business day following your procedure to check on you and address any questions or concerns that you may have regarding the information given to you following your procedure. If we do not reach you, we will leave a message.  However, if you are feeling well and you are not experiencing any problems, there is no need to return our call.  We will assume that you have returned to your regular daily activities without incident.  If any biopsies were taken you will be contacted by phone or by letter within the next 1-3 weeks.  Please call us at [phone] if you have not heard about the biopsies in 3 weeks.    SIGNATURES/CONFIDENTIALITY: You and/or your care partner have signed paperwork which will be entered into your electronic medical record.  These signatures attest to the fact that that the information above on your After Visit Summary has been reviewed and is understood.  Full responsibility of  the confidentiality of this discharge information lies with you and/or your care-partner. 

## 2015-07-29 NOTE — Progress Notes (Signed)
A/ox3, pleased with MAC, report to RN 

## 2015-07-29 NOTE — Op Note (Signed)
[facility] Patient Name: Philip [name] Procedure Date: 07/29/2015 2:22 PM MRN: [id number] Endoscopist: Mallie Mussel L. Loletha Carrow , MD Age: 70 Referring MD:  Date of Birth: February 21, 1946 Gender: Male Procedure:                Upper GI endoscopy Indications:              Follow-up of Barrett's esophagus, first diagnosed                            in 2008, always short segment. 2014 Bx was                            indeterminate for dysplasia Medicines:                Monitored Anesthesia Care Procedure:                Pre-Anesthesia Assessment:                           - Prior to the procedure, a History and Physical                            was performed, and patient medications and                            allergies were reviewed. The patient's tolerance of                            previous anesthesia was also reviewed. The risks                            and benefits of the procedure and the sedation                            options and risks were discussed with the patient.                            All questions were answered, and informed consent                            was obtained. Prior Anticoagulants: The patient has                            taken no previous anticoagulant or antiplatelet                            agents. ASA Grade Assessment: III - A patient with                            severe systemic disease. After reviewing the risks                            and benefits, the patient was deemed in  satisfactory condition to undergo the procedure.                           After obtaining informed consent, the endoscope was                            passed under direct vision. Throughout the                            procedure, the patient's blood pressure, pulse, and                            oxygen saturations were monitored continuously. The                            Model GIF-HQ190 [id number]) scope was introduced                         through the mouth, and advanced to the second part                            of duodenum. The upper GI endoscopy was                            accomplished without difficulty. The patient                            tolerated the procedure fairly well. Scope In: Scope Out: Findings:      Two islands of salmon-colored mucosa were present at 39 cm, with the EGJ       at 40 cm. No other visible abnormalities were present. Biopsies were       taken with a cold forceps for histology.      Multiple papules (nodules) were found in the gastric antrum. This was       present on prior EGD (at which time it was biopsied), and is stable by       description.      A single 8 mm submucosal nodule was found in the second portion of the       duodenum. This was present on prior EGD, and is stable by endoscopic       photo appearance.      The cardia and gastric fundus were normal on retroflexion. Complications:            No immediate complications. Estimated Blood Loss:     Estimated blood loss: none. Impression:               - Salmon-colored mucosa. Biopsied.                           - Multiple papules (nodules) found in the stomach.                           - Submucosal nodule found in the duodenum. Recommendation:           - Patient has a contact number available for  emergencies. The signs and symptoms of potential                            delayed complications were discussed with the                            patient. Return to normal activities tomorrow.                            Written discharge instructions were provided to the                            patient.                           - Resume previous diet.                           - Continue present medications.                           - Await pathology results.                           - Repeat upper endoscopy for surveillance based on                            pathology  results. Procedure Code(s):        --- Professional ---                           [phone], Esophagogastroduodenoscopy, flexible,                            transoral; with biopsy, single or multiple CPT copyright 2016 [employer]. All rights reserved. Esias Mory L. Loletha Carrow, MD 07/29/2015 2:49:47 PM This report has been signed electronically. Number of Addenda: 0 Referring MD:      Olin Hauser

## 2015-07-29 NOTE — Progress Notes (Signed)
Called to room to assist during endoscopic procedure.  Patient ID and intended procedure confirmed with present staff. Received instructions for my participation in the procedure from the performing physician.  

## 2015-07-30 ENCOUNTER — Telehealth: Payer: Self-pay | Admitting: Emergency Medicine

## 2015-07-30 NOTE — Telephone Encounter (Signed)
Left message, no identifier, f/u 

## 2015-08-05 ENCOUNTER — Encounter: Payer: Self-pay | Admitting: Gastroenterology

## 2016-10-09 ENCOUNTER — Emergency Department (INDEPENDENT_AMBULATORY_CARE_PROVIDER_SITE_OTHER)
Admission: EM | Admit: 2016-10-09 | Discharge: 2016-10-09 | Disposition: A | Discharge status: Home / Self Care | Payer: Medicare Other | Source: Home / Self Care | Attending: Family Medicine | Admitting: Family Medicine

## 2016-10-09 ENCOUNTER — Encounter: Payer: Self-pay | Admitting: Emergency Medicine

## 2016-10-09 ENCOUNTER — Emergency Department (INDEPENDENT_AMBULATORY_CARE_PROVIDER_SITE_OTHER): Payer: Medicare Other

## 2016-10-09 DIAGNOSIS — G8929 Other chronic pain: Secondary | ICD-10-CM

## 2016-10-09 DIAGNOSIS — M545 Low back pain, unspecified: Secondary | ICD-10-CM

## 2016-10-09 MED ORDER — PREDNISONE 20 MG PO TABS: ORAL_TABLET | ORAL | 0 refills | Status: DC

## 2016-10-09 NOTE — ED Provider Notes (Signed)
[name]    CSN: [id number] Arrival date & time: 10/09/16  1739     History   Chief Complaint Chief Complaint  Patient presents with  . Back Pain    HPI DEIONTAE Philip [name] is a 71 y.o. male.   Patient has a history of chronic lower back pain, followed by a pain clinic, and usually controlled with tramadol.  He has a past history of  L5 diskectomy.  He has had increased right back pain for about 3 days.  He recalls no injury.   He denies bowel or bladder dysfunction, and no saddle numbness.    The history is provided by the patient.  Back Pain  Location:  Lumbar spine Quality:  Aching Radiates to:  Does not radiate Pain severity:  Moderate Pain is:  Same all the time Onset quality:  Gradual Duration:  3 days Timing:  Constant Progression:  Unchanged Chronicity:  Chronic Context: not lifting heavy objects and not recent injury   Worsened by:  Ambulation Ineffective treatments: tramadol. Associated symptoms: no abdominal pain, no abdominal swelling, no bladder incontinence, no bowel incontinence, no dysuria, no fever, no leg pain, no numbness, no paresthesias, no perianal numbness, no tingling, no weakness and no weight loss   Risk factors: lack of exercise and obesity     Past Medical History:  Diagnosis Date  . Abdominal hernia   . Asthma   . Cough    over 2 weeks  . Fluid retention in legs   . H/O hiatal hernia   . SOB (shortness of breath)     Patient Active Problem List   Diagnosis Date Noted  . ESOPHAGEAL REFLUX 05/08/2009  . BARRETTS ESOPHAGUS 05/08/2009  . DIVERTICULOSIS OF COLON 05/08/2009  . ABDOMINAL BLOATING 05/08/2009    Past Surgical History:  Procedure Laterality Date  . LUMBAR LAMINECTOMY     1980's       Home Medications    Prior to Admission medications   Medication Sig Start Date End Date Taking? Authorizing Provider  amitriptyline (ELAVIL) 25 MG tablet Take 3-4 tablets prn 04/01/12  Yes [provider]    hydrochlorothiazide (HYDRODIURIL) 25 MG tablet Take 25 mg by mouth daily. Reported on 07/14/2015   Yes [provider]  lisinopril-hydrochlorothiazide (PRINZIDE,ZESTORETIC) 20-25 MG per tablet  03/23/12  Yes [provider]  Multiple Vitamin (MULTIVITAMIN) tablet Take 1 tablet by mouth daily.   Yes [provider]  traMADol (ULTRAM) 50 MG tablet Take 50 mg by mouth every 6 (six) hours as needed for pain.   Yes [provider]  predniSONE (DELTASONE) 20 MG tablet Take one tab by mouth twice daily for 5 days, then one daily for 3 days. Take with food. 10/09/16   Kandra Nicolas, MD    Family History Family History  Problem Relation Age of Onset  . Heart disease Mother   . Lung cancer Father   . Colon cancer Neg Hx     Social History Social History  Substance Use Topics  . Smoking status: Never Smoker  . Smokeless tobacco: Never Used  . Alcohol use 1.2 - 2.4 oz/week    2 - 4 Standard drinks or equivalent per week     Comment: GLASS WINE A NIGHT     Allergies   Patient has no known allergies.   Review of Systems Review of Systems  Constitutional: Negative for fever and weight loss.  Gastrointestinal: Negative for abdominal pain and bowel incontinence.  Genitourinary:  Negative for bladder incontinence and dysuria.  Musculoskeletal: Positive for back pain.  Neurological: Negative for tingling, weakness, numbness and paresthesias.     Physical Exam Triage Vital Signs ED Triage Vitals  Enc Vitals Group     BP 10/09/16 1753 127/78     Pulse Rate 10/09/16 1753 89     Resp --      Temp 10/09/16 1753 98.5 F (36.9 C)     Temp Source 10/09/16 1753 Oral     SpO2 10/09/16 1753 93 %     Weight 10/09/16 1754 280 lb (127 kg)     Height 10/09/16 1754 6' (1.829 m)     Head Circumference --      Peak Flow --      Pain Score 10/09/16 1754 2     Pain Loc --      Pain Edu? --      Excl. in [place]? --    No data found.   Updated Vital Signs BP  127/78 (BP Location: Left Arm)   Pulse 89   Temp 98.5 F (36.9 C) (Oral)   Ht 6' (1.829 m)   Wt 280 lb (127 kg)   SpO2 93%   BMI 37.97 kg/m   Visual Acuity Right Eye Distance:   Left Eye Distance:   Bilateral Distance:    Right Eye Near:   Left Eye Near:    Bilateral Near:     Physical Exam  Constitutional: He appears well-developed and well-nourished. No distress.  HENT:  Head: Normocephalic.  Right Ear: External ear normal.  Left Ear: External ear normal.  Nose: Nose normal.  Mouth/Throat: Oropharynx is clear and moist.  Eyes: EOM are normal. Pupils are equal, round, and reactive to light.  Neck: Normal range of motion.  Cardiovascular: Normal heart sounds.   Pulmonary/Chest: Breath sounds normal.  Abdominal: There is no tenderness.  Musculoskeletal: He exhibits no edema.       Back:  Back:    Can heel/toe walk and squat without difficulty.  Decreased forward flexion.  Tenderness in   right paraspinous muscles from L3 to L4.  Straight leg raising test is negative.  Sitting knee extension test is negative.  Strength and sensation in the lower extremities is normal.  Patellar and achilles reflexes are present     Neurological: He is alert.  Skin: Skin is warm and dry. No rash noted.  Nursing note and vitals reviewed.    UC Treatments / Results  Labs (all labs ordered are listed, but only abnormal results are displayed) Labs Reviewed - No data to display  EKG  EKG Interpretation None       Radiology Dg Lumbar Spine Complete  Result Date: 10/09/2016 CLINICAL DATA:  Chronic low back pain EXAM: LUMBAR SPINE - COMPLETE 4+ VIEW COMPARISON:  None. FINDINGS: Five lumbar-type vertebral bodies. Normal lumbar lordosis. No evidence of fracture or dislocation. Vertebral body heights are maintained. Moderate multilevel degenerative changes. Visualized bony pelvis appears intact. IMPRESSION: No fracture or dislocation is seen. Moderate multilevel degenerative changes.  Electronically Signed   By: Julian Hy M.D.   On: 10/09/2016 18:18    Procedures Procedures (including critical care time)  Medications Ordered in UC Medications - No data to display   Initial Impression / Assessment and Plan / UC Course  I have reviewed the triage vital signs and the nursing notes.  Pertinent labs & imaging results that were available during my care of the patient were reviewed by me  and considered in my medical decision making (see chart for details).    Begin prednisone burst/taper. Apply ice pack for 20 to 30 minutes, 3 to 4 times daily  Continue until pain and swelling decrease.  Followup with pain clinic early if not improved in 10 to 14 days.   Final Clinical Impressions(s) / UC Diagnoses   Final diagnoses:  Recurrent low back pain    New Prescriptions New Prescriptions   PREDNISONE (DELTASONE) 20 MG TABLET    Take one tab by mouth twice daily for 5 days, then one daily for 3 days. Take with food.     Kandra Nicolas, MD 10/10/16 1622

## 2016-10-09 NOTE — ED Triage Notes (Signed)
Patient presents to [facility] with complaint of lower back pain for 3 days.

## 2016-10-09 NOTE — Discharge Instructions (Signed)
Apply ice pack for 20 to 30 minutes, 3 to 4 times daily  Continue until pain and swelling decrease.  °

## 2016-10-17 ENCOUNTER — Encounter (HOSPITAL_BASED_OUTPATIENT_CLINIC_OR_DEPARTMENT_OTHER): Payer: Self-pay | Admitting: *Deleted

## 2016-10-17 ENCOUNTER — Emergency Department (HOSPITAL_BASED_OUTPATIENT_CLINIC_OR_DEPARTMENT_OTHER)
Admission: EM | Admit: 2016-10-17 | Discharge: 2016-10-18 | Disposition: A | Discharge status: Home / Self Care | Payer: Medicare Other | Attending: Emergency Medicine | Admitting: Emergency Medicine

## 2016-10-17 DIAGNOSIS — J45909 Unspecified asthma, uncomplicated: Secondary | ICD-10-CM | POA: Diagnosis not present

## 2016-10-17 DIAGNOSIS — R1312 Dysphagia, oropharyngeal phase: Secondary | ICD-10-CM | POA: Diagnosis not present

## 2016-10-17 DIAGNOSIS — K222 Esophageal obstruction: Secondary | ICD-10-CM | POA: Diagnosis present

## 2016-10-17 DIAGNOSIS — Z79899 Other long term (current) drug therapy: Secondary | ICD-10-CM | POA: Insufficient documentation

## 2016-10-17 LAB — CBC WITH DIFFERENTIAL/PLATELET
BASOS ABS: 0 10*3/uL (ref 0.0–0.1)
Basophils Relative: 0 %
Eosinophils Absolute: 0.1 10*3/uL (ref 0.0–0.7)
Eosinophils Relative: 1 %
HEMATOCRIT: 40 % (ref 39.0–52.0)
HEMOGLOBIN: 13.8 g/dL (ref 13.0–17.0)
Lymphocytes Relative: 10 %
Lymphs Abs: 1 10*3/uL (ref 0.7–4.0)
MCH: 32.5 pg (ref 26.0–34.0)
MCHC: 34.5 g/dL (ref 30.0–36.0)
MCV: 94.1 fL (ref 78.0–100.0)
Monocytes Absolute: 0.4 10*3/uL (ref 0.1–1.0)
Monocytes Relative: 4 %
NEUTROS ABS: 8.2 10*3/uL — AB (ref 1.7–7.7)
NEUTROS PCT: 85 %
Platelets: 206 10*3/uL (ref 150–400)
RBC: 4.25 MIL/uL (ref 4.22–5.81)
RDW: 13.8 % (ref 11.5–15.5)
WBC: 9.6 10*3/uL (ref 4.0–10.5)

## 2016-10-17 LAB — BASIC METABOLIC PANEL
ANION GAP: 7 (ref 5–15)
BUN: 18 mg/dL (ref 6–20)
CHLORIDE: 106 mmol/L (ref 101–111)
CO2: 25 mmol/L (ref 22–32)
Calcium: 10.8 mg/dL — ABNORMAL HIGH (ref 8.9–10.3)
Creatinine, Ser: 0.87 mg/dL (ref 0.61–1.24)
GFR calc non Af Amer: >60 mL/min (ref >60)
Glucose, Bld: 129 mg/dL — ABNORMAL HIGH (ref 65–99)
Potassium: 4.2 mmol/L (ref 3.5–5.1)
Sodium: 138 mmol/L (ref 135–145)

## 2016-10-17 NOTE — ED Triage Notes (Signed)
States his food gets stuck in his esophagus. Started 3 days ago.

## 2016-10-18 ENCOUNTER — Emergency Department (HOSPITAL_BASED_OUTPATIENT_CLINIC_OR_DEPARTMENT_OTHER): Payer: Medicare Other

## 2016-10-18 ENCOUNTER — Telehealth: Payer: Self-pay | Admitting: Gastroenterology

## 2016-10-18 ENCOUNTER — Ambulatory Visit (HOSPITAL_COMMUNITY)
Admission: RE | Admit: 2016-10-18 | Discharge: 2016-10-18 | Disposition: A | Discharge status: Home / Self Care | Payer: Medicare Other | Source: Ambulatory Visit | Attending: Emergency Medicine | Admitting: Emergency Medicine

## 2016-10-18 DIAGNOSIS — K224 Dyskinesia of esophagus: Secondary | ICD-10-CM

## 2016-10-18 DIAGNOSIS — R1310 Dysphagia, unspecified: Secondary | ICD-10-CM

## 2016-10-18 DIAGNOSIS — K449 Diaphragmatic hernia without obstruction or gangrene: Secondary | ICD-10-CM | POA: Insufficient documentation

## 2016-10-18 DIAGNOSIS — R1312 Dysphagia, oropharyngeal phase: Secondary | ICD-10-CM | POA: Diagnosis not present

## 2016-10-18 NOTE — ED Provider Notes (Signed)
[facility] Provider Note: Georgena Spurling, MD, FACEP  CSN: [id number] MRN: [id number] ARRIVAL: 10/17/16 at 2028 ROOM: MH12/MH12   CHIEF COMPLAINT  Esophageal Stricture   HISTORY OF PRESENT ILLNESS  Philip [name] is a 70 y.o. male with a history of obstructive sleep apnea BiPAP and Barrett's esophagus. He is here with a three-day history of difficulty swallowing. He is able to swallow liquids without difficulty. When he tries to swallow solids he feels like they sometimes get caught in his upper esophagus. He sometimes has to "cough out" the food and try swallowing again. He denies airway occlusion or difficulty breathing. He denies pain associated with this. He is a patient of [facility].   Past Medical History:  Diagnosis Date  . Abdominal hernia   . Asthma   . Cough    over 2 weeks  . Fluid retention in legs   . H/O hiatal hernia   . SOB (shortness of breath)     Past Surgical History:  Procedure Laterality Date  . LUMBAR LAMINECTOMY     1980's    Family History  Problem Relation Age of Onset  . Heart disease Mother   . Lung cancer Father   . Colon cancer Neg Hx     Social History  Substance Use Topics  . Smoking status: Never Smoker  . Smokeless tobacco: Never Used  . Alcohol use 1.2 - 2.4 oz/week    2 - 4 Standard drinks or equivalent per week     Comment: GLASS WINE A NIGHT    Prior to Admission medications   Medication Sig Start Date End Date Taking? Authorizing Provider  amitriptyline (ELAVIL) 25 MG tablet Take 3-4 tablets prn 04/01/12   [provider]  hydrochlorothiazide (HYDRODIURIL) 25 MG tablet Take 25 mg by mouth daily. Reported on 07/14/2015    [provider]  lisinopril-hydrochlorothiazide Reita May) 20-25 MG per tablet  03/23/12   [provider]  Multiple Vitamin (MULTIVITAMIN) tablet Take 1 tablet by mouth daily.    [provider]  predniSONE (DELTASONE) 20 MG tablet  Take one tab by mouth twice daily for 5 days, then one daily for 3 days. Take with food. 10/09/16   Kandra Nicolas, MD  traMADol (ULTRAM) 50 MG tablet Take 50 mg by mouth every 6 (six) hours as needed for pain.    [provider]    Allergies Patient has no known allergies.   REVIEW OF SYSTEMS  Negative except as noted here or in the History of Present Illness.   PHYSICAL EXAMINATION  Initial Vital Signs Blood pressure 139/90, pulse 76, temperature 97.5 F (36.4 C), temperature source Oral, resp. rate 20, height 6' (1.829 m), weight 127 kg (280 lb), SpO2 99 %.  Examination General: Well-developed, well-nourished male in no acute distress; appearance consistent with age of record HENT: normocephalic; atraumatic; pharynx normal; no stridor; no dysphonia Eyes: pupils equal, round and reactive to light; extraocular muscles intact Neck: supple Heart: regular rate and rhythm Lungs: clear to auscultation bilaterally Abdomen: soft; nondistended; nontender; bowel sounds present Extremities: No deformity; full range of motion; +1 edema of lower legs Neurologic: Awake, alert and oriented; motor function intact in all extremities and symmetric; no facial droop Skin: Warm and dry Psychiatric: Normal mood and affect   RESULTS  Summary of this visit's results, reviewed by myself:   EKG Interpretation  Date/Time:  Monday October 17 2016 21:01:09 EDT Ventricular Rate:  89 PR Interval:  188 QRS  Duration: 112 QT Interval:  360 QTC Calculation: 438 R Axis:   -25 Text Interpretation:  Normal sinus rhythm Incomplete right bundle branch block Borderline ECG No previous ECGs available Confirmed by Fredia Sorrow [phone]) on 10/17/2016 9:10:30 PM      Laboratory Studies: Results for orders placed or performed during the hospital encounter of 10/17/16 (from the past 24 hour(s))  CBC with Differential     Status: Abnormal   Collection Time: 10/17/16  9:29 PM  Result Value Ref Range    WBC 9.6 4.0 - 10.5 K/uL   RBC 4.25 4.22 - 5.81 MIL/uL   Hemoglobin 13.8 13.0 - 17.0 g/dL   HCT 40.0 39.0 - 52.0 %   MCV 94.1 78.0 - 100.0 fL   MCH 32.5 26.0 - 34.0 pg   MCHC 34.5 30.0 - 36.0 g/dL   RDW 13.8 11.5 - 15.5 %   Platelets 206 150 - 400 K/uL   Neutrophils Relative % 85 %   Neutro Abs 8.2 (H) 1.7 - 7.7 K/uL   Lymphocytes Relative 10 %   Lymphs Abs 1.0 0.7 - 4.0 K/uL   Monocytes Relative 4 %   Monocytes Absolute 0.4 0.1 - 1.0 K/uL   Eosinophils Relative 1 %   Eosinophils Absolute 0.1 0.0 - 0.7 K/uL   Basophils Relative 0 %   Basophils Absolute 0.0 0.0 - 0.1 K/uL  Basic metabolic panel     Status: Abnormal   Collection Time: 10/17/16  9:29 PM  Result Value Ref Range   Sodium 138 135 - 145 mmol/L   Potassium 4.2 3.5 - 5.1 mmol/L   Chloride 106 101 - 111 mmol/L   CO2 25 22 - 32 mmol/L   Glucose, Bld 129 (H) 65 - 99 mg/dL   BUN 18 6 - 20 mg/dL   Creatinine, Ser 0.87 0.61 - 1.24 mg/dL   Calcium 10.8 (H) 8.9 - 10.3 mg/dL   GFR calc non Af Amer >60 >60 mL/min   GFR calc Af Amer >60 >60 mL/min   Anion gap 7 5 - 15   Imaging Studies: Dg Neck Soft Tissue  Result Date: 10/18/2016 CLINICAL DATA:  50-year-old male with sensation of food getting stuck in throat for 3 days. No pain or swelling. EXAM: NECK SOFT TISSUES - 1+ VIEW COMPARISON:  None. FINDINGS: There is no evidence of retropharyngeal soft tissue swelling or epiglottic enlargement. The cervical airway is unremarkable and no radio-opaque foreign body identified. There is mild anterior ossified at C5-C6. IMPRESSION: Unremarkable soft tissues and patent airways. No radiopaque foreign object. Small anterior osteophyte at C5-C6. Electronically Signed   By: Anner [place] M.D.   On: 10/18/2016 01:43    ED COURSE  Nursing notes and initial vitals signs, including pulse oximetry, reviewed.  Vitals:   10/17/16 2033 10/17/16 2036 10/18/16 0020  BP:  (!) 143/80 139/90  Pulse:  92 76  Resp:  (!) 22 20  Temp:  97.5 F (36.4  C)   TempSrc:  Oral   SpO2:  100% 99%  Weight: 127 kg (280 lb)    Height: 6' (1.829 m)     1:46 AM We'll arrange for outpatient swallowing study and have him follow-up with [facility]ology.  PROCEDURES    ED DIAGNOSES     ICD-10-CM   1. Oropharyngeal dysphagia R13.12        Shanon Rosser, MD 10/18/16 [phone]

## 2016-10-18 NOTE — Discharge Instructions (Signed)
Call [facility] Radiology Scheduling [phone]) later this morning to schedule your swallowing study. An order for the study has already been entered into the computer.

## 2016-10-18 NOTE — Telephone Encounter (Signed)
Patient was seen in ED yesterday, having difficulty swallowing. Patient states he is able to drink liquids, swallow his medication. He takes OTC Nexium 40 mg day. He has an office visit with APP on 6/28. Patient wants to know what else he should be doing until appointment.

## 2016-10-18 NOTE — Telephone Encounter (Addendum)
I spoke to him this evening. He can get down liquids.  Cannot keep down solids.  Seemed to come on a few days ago. "maybe" having trouble prior to that to a lesser degree.  [facility] (where he was last evening) sent him for Barium swallow today.  Tablet hung up at EGJ. Limited study, no clear food impaction by my review.  I told him to ben NPO after MN except for his AM BP meds, which he should take by 6AM with water.  I will have to look at my partners' schedule tomorrow (esp hospital doc) and see if someone can do an EGD/dilation. I am not in the endo lab at all Wednesday.

## 2016-10-19 ENCOUNTER — Ambulatory Visit (HOSPITAL_COMMUNITY)
Admission: RE | Admit: 2016-10-19 | Discharge: 2016-10-19 | Disposition: A | Discharge status: Home / Self Care | Payer: Medicare Other | Source: Ambulatory Visit | Attending: Gastroenterology | Admitting: Gastroenterology

## 2016-10-19 ENCOUNTER — Ambulatory Visit (HOSPITAL_COMMUNITY): Payer: Medicare Other | Admitting: Anesthesiology

## 2016-10-19 ENCOUNTER — Encounter (HOSPITAL_COMMUNITY): Payer: Self-pay | Admitting: Anesthesiology

## 2016-10-19 ENCOUNTER — Other Ambulatory Visit: Payer: Self-pay

## 2016-10-19 ENCOUNTER — Encounter (HOSPITAL_COMMUNITY)
Admission: RE | Disposition: A | Discharge status: Home / Self Care | Payer: Self-pay | Source: Ambulatory Visit | Attending: Gastroenterology

## 2016-10-19 DIAGNOSIS — K222 Esophageal obstruction: Secondary | ICD-10-CM

## 2016-10-19 DIAGNOSIS — R933 Abnormal findings on diagnostic imaging of other parts of digestive tract: Secondary | ICD-10-CM

## 2016-10-19 DIAGNOSIS — K3189 Other diseases of stomach and duodenum: Secondary | ICD-10-CM | POA: Insufficient documentation

## 2016-10-19 DIAGNOSIS — K449 Diaphragmatic hernia without obstruction or gangrene: Secondary | ICD-10-CM | POA: Diagnosis not present

## 2016-10-19 DIAGNOSIS — K2270 Barrett's esophagus without dysplasia: Secondary | ICD-10-CM

## 2016-10-19 DIAGNOSIS — Z79899 Other long term (current) drug therapy: Secondary | ICD-10-CM | POA: Diagnosis not present

## 2016-10-19 DIAGNOSIS — Z6837 Body mass index (BMI) 37.0-37.9, adult: Secondary | ICD-10-CM | POA: Diagnosis not present

## 2016-10-19 DIAGNOSIS — I10 Essential (primary) hypertension: Secondary | ICD-10-CM | POA: Diagnosis not present

## 2016-10-19 DIAGNOSIS — R1310 Dysphagia, unspecified: Secondary | ICD-10-CM

## 2016-10-19 DIAGNOSIS — G4730 Sleep apnea, unspecified: Secondary | ICD-10-CM | POA: Diagnosis not present

## 2016-10-19 DIAGNOSIS — K2960 Other gastritis without bleeding: Secondary | ICD-10-CM

## 2016-10-19 DIAGNOSIS — Z9989 Dependence on other enabling machines and devices: Secondary | ICD-10-CM | POA: Diagnosis not present

## 2016-10-19 DIAGNOSIS — K219 Gastro-esophageal reflux disease without esophagitis: Secondary | ICD-10-CM | POA: Diagnosis not present

## 2016-10-19 HISTORY — PX: BALLOON DILATION: SHX5330

## 2016-10-19 HISTORY — PX: ESOPHAGOGASTRODUODENOSCOPY: SHX5428

## 2016-10-19 SURGERY — EGD (ESOPHAGOGASTRODUODENOSCOPY)
Anesthesia: Monitor Anesthesia Care

## 2016-10-19 MED ORDER — PROPOFOL 10 MG/ML IV BOLUS
INTRAVENOUS | Status: AC
  Filled 2016-10-19: qty 20

## 2016-10-19 MED ORDER — PROPOFOL 500 MG/50ML IV EMUL
INTRAVENOUS | Status: DC | PRN
  Administered 2016-10-19: 50 mg via INTRAVENOUS

## 2016-10-19 MED ORDER — SODIUM CHLORIDE 0.9 % IV SOLN
INTRAVENOUS | Status: DC
Start: 2016-10-19 — End: 2016-10-19

## 2016-10-19 MED ORDER — PROPOFOL 500 MG/50ML IV EMUL
INTRAVENOUS | Status: DC | PRN
  Administered 2016-10-19: 125 ug/kg/min via INTRAVENOUS

## 2016-10-19 MED ORDER — LACTATED RINGERS IV SOLN
INTRAVENOUS | Status: DC
  Administered 2016-10-19: 14:00:00 via INTRAVENOUS
  Administered 2016-10-19: 1000 mL via INTRAVENOUS

## 2016-10-19 NOTE — Anesthesia Preprocedure Evaluation (Signed)
Anesthesia Evaluation  Patient identified by MRN, date of birth, ID band Patient awake    Reviewed: Allergy & Precautions, NPO status , Patient's Chart, lab work & pertinent test results  History of Anesthesia Complications Negative for: history of anesthetic complications  Airway Mallampati: II  TM Distance: >3 FB Neck ROM: Full    Dental  (+) Teeth Intact   Pulmonary asthma , sleep apnea and Continuous Positive Airway Pressure Ventilation ,    breath sounds clear to auscultation       Cardiovascular hypertension, Pt. on medications (-) angina(-) Past MI and (-) CHF  Rhythm:Regular     Neuro/Psych negative neurological ROS  negative psych ROS   GI/Hepatic Neg liver ROS, hiatal hernia, GERD  Medicated,  Endo/Other  Morbid obesity  Renal/GU negative Renal ROS     Musculoskeletal   Abdominal   Peds  Hematology negative hematology ROS (+)   Anesthesia Other Findings   Reproductive/Obstetrics                             Anesthesia Physical Anesthesia Plan  ASA: III  Anesthesia Plan: MAC   Post-op Pain Management:    Induction:   PONV Risk Score and Plan: 1 and Treatment may vary due to age or medical condition  Airway Management Planned: Nasal Cannula, Simple Face Mask and Natural Airway  Additional Equipment: None  Intra-op Plan:   Post-operative Plan:   Informed Consent: I have reviewed the patients History and Physical, chart, labs and discussed the procedure including the risks, benefits and alternatives for the proposed anesthesia with the patient or authorized representative who has indicated his/her understanding and acceptance.   Dental advisory given  Plan Discussed with: Surgeon and CRNA  Anesthesia Plan Comments:         Anesthesia Quick Evaluation

## 2016-10-19 NOTE — Op Note (Signed)
[facility] Patient Name: Philip [name] Procedure Date: 10/19/2016 MRN: [id number] Attending MD: Ladene Artist , MD Date of Birth: 01-11-1946 CSN: [id number] Age: 71 Admit Type: Outpatient Procedure:                Upper GI endoscopy Indications:              Dysphagia, Abnormal cine-esophagram Providers:                Pricilla Riffle. Fuller Plan, MD, Elmer Ramp. Hinson, RN, Cherylynn Ridges, Technician, [employer], CRNA Referring MD:              Medicines:                Monitored Anesthesia Care Complications:            No immediate complications. Estimated Blood Loss:     Estimated blood loss was minimal. Procedure:                Pre-Anesthesia Assessment:                           - Prior to the procedure, a History and Physical                            was performed, and patient medications and                            allergies were reviewed. The patient's tolerance of                            previous anesthesia was also reviewed. The risks                            and benefits of the procedure and the sedation                            options and risks were discussed with the patient.                            All questions were answered, and informed consent                            was obtained. Prior Anticoagulants: The patient has                            taken no previous anticoagulant or antiplatelet                            agents. ASA Grade Assessment: II - A patient with                            mild systemic disease. After reviewing the risks  and benefits, the patient was deemed in                            satisfactory condition to undergo the procedure.                           After obtaining informed consent, the endoscope was                            passed under direct vision. Throughout the                            procedure, the patient's blood pressure, pulse, and                  oxygen saturations were monitored continuously. The                            Endoscope was introduced through the mouth, and                            advanced to the second part of duodenum. The upper                            GI endoscopy was accomplished without difficulty.                            The patient tolerated the procedure well. Scope In: Scope Out: Findings:      There were esophageal mucosal changes secondary to established       short-segment Barrett's disease present in the distal esophagus with 2       islands noted at 39 cm. The maximum longitudinal extent of these mucosal       changes was 1 cm in length. Mucosa was biopsied with a cold forceps for       histology. One specimen bottle was sent to pathology.      One mild benign-appearing, intrinsic stenosis was found at the       gastroesophageal junction. This measured 1.4 cm (inner diameter) and was       traversed. This area appear to be inflammed possibly following a recent       food or pill impaction. Mucosa was biopsied with a cold forcepts for       histology and submitted with adjancent Barrett's biopsies. A guidewire       was placed and the scope was withdrawn. Dilation was performed with a       Savary dilator with no resistance at 17 mm.      The exam of the esophagus was otherwise normal.      Diffuse moderate inflammation characterized by nodularity, friability,       granularity was found in the gastric antrum. Biopsies were taken with a       cold forceps for histology.      The exam of the stomach was otherwise normal.      There was a small submucosal nodule, possible lipoma, 8 mm in diameter,       in the second portion of the duodenum.      The exam  of the duodenum was otherwise normal. Impression:               - Esophageal mucosal changes secondary to                            established short-segment Barrett's disease.                            Biopsied.                            - Benign-appearing EGJ stenosis and inflammation.                            Dilated.                           - Nodular gastritis. Biopsied.                           - Duodenal submucosal nodule. Moderate Sedation:      N/A- Per Anesthesia Care Recommendation:           - Clear liquid diet for 2 hours, then advance as                            tolerated to full liquid diet today and soft diet                            as tolerated tomorrow.                           - Continue present medications including Nexium 40                            mg po qam.                           - Await pathology results.                           - Return to GI office in 1 month.                           - Patient has a contact number available for                            emergencies. The signs and symptoms of potential                            delayed complications were discussed with the                            patient. Return to normal activities tomorrow.                            Written discharge instructions were provided to the  patient. Procedure Code(s):        --- Professional ---                           [phone], Esophagogastroduodenoscopy, flexible,                            transoral; with insertion of guide wire followed by                            passage of dilator(s) through esophagus over guide                            wire                           [id number], Esophagogastroduodenoscopy, flexible,                            transoral; with biopsy, single or multiple Diagnosis Code(s):        --- Professional ---                           K22.70, Barrett's esophagus without dysplasia                           K22.2, Esophageal obstruction                           K29.60, Other gastritis without bleeding                           D17.5, Benign lipomatous neoplasm of                            intra-abdominal organs                            R13.10, Dysphagia, unspecified                           R93.3, Abnormal findings on diagnostic imaging of                            other parts of digestive tract CPT copyright 2016 American [employer] Association. All rights reserved. The codes documented in this report are preliminary and upon coder review may  be revised to meet current compliance requirements. Ladene Artist, MD 10/19/2016 2:40:56 PM This report has been signed electronically. Number of Addenda: 0

## 2016-10-19 NOTE — Telephone Encounter (Signed)
Spoke to patient he is able to come to [facility] hospital today, procedure scheduled for 2:00 today, arriving at hospital 12:30. Patient has not taken his BP med at 6:00 am as directed, instructed to take it now per Dr. Loletha Carrow. He will take with very small sip of water, after that nothing to eat or drink.

## 2016-10-19 NOTE — Transfer of Care (Signed)
Immediate Anesthesia Transfer of Care Note  Patient: Philip [name]  Procedure(s) Performed: Procedure(s): ESOPHAGOGASTRODUODENOSCOPY (EGD) (N/A) BALLOON DILATION (N/A)  Patient Location: PACU  Anesthesia Type:MAC  Level of Consciousness:  sedated, patient cooperative and responds to stimulation  Airway & Oxygen Therapy:Patient Spontanous Breathing and Patient connected to face mask oxgen  Post-op Assessment:  Report given to PACU RN and Post -op Vital signs reviewed and stable  Post vital signs:  Reviewed and stable  Last Vitals:  Vitals:   10/19/16 1245  BP: (!) 144/76  Pulse: 84  Resp: 17  Temp: 62.1 C    Complications: No apparent anesthesia complications

## 2016-10-19 NOTE — H&P (Signed)
ADMISSION NOTE  Primary Care Physician:  Olin Hauser, MD Primary Gastroenterologist:  Wilfrid Lund,  MD  CHIEF COMPLAINT:  dysphagia  HPI: Philip [name] is a 71 y.o. male with short segment Barrett's who noted solid food dysphagia for the past several days. Liquids are ok. Presented to [facility] yesterday and was sent home for barium esophagram and further outpatient follow up. Dr. Loletha Carrow asked me to perform his EGD today at [facility].   BA esophagram form 10/18/2016.   *Moderate esophageal dysmotility. *Short segment narrowing/ stricture at the distal esophagus just above the GE junction. A 13 mm barium tablet lodged at this location and did not pass. *Small hiatal hernia  Past Medical History:  Diagnosis Date  . Abdominal hernia   . Asthma   . Cough    over 2 weeks  . Fluid retention in legs   . H/O hiatal hernia   . SOB (shortness of breath)     Past Surgical History:  Procedure Laterality Date  . LUMBAR LAMINECTOMY     1980's    Prior to Admission medications   Medication Sig Start Date End Date Taking? Authorizing Provider  amitriptyline (ELAVIL) 25 MG tablet Take 3-4 tablets prn 04/01/12  Yes [provider]  lisinopril-hydrochlorothiazide (PRINZIDE,ZESTORETIC) 20-25 MG per tablet  03/23/12  Yes [provider]  Multiple Vitamin (MULTIVITAMIN) tablet Take 1 tablet by mouth daily.   Yes [provider]  predniSONE (DELTASONE) 20 MG tablet Take one tab by mouth twice daily for 5 days, then one daily for 3 days. Take with food. 10/09/16  Yes Kandra Nicolas, MD  traMADol (ULTRAM) 50 MG tablet Take 50 mg by mouth every 6 (six) hours as needed for pain.   Yes [provider]  hydrochlorothiazide (HYDRODIURIL) 25 MG tablet Take 25 mg by mouth daily. Reported on 07/14/2015    [provider]    Current Facility-Administered Medications  Medication Dose Route Frequency Provider Last Rate Last Dose  . 0.9 %  sodium chloride infusion    Intravenous Continuous Ladene Artist, MD      . lactated ringers infusion   Intravenous Continuous Ladene Artist, MD        Allergies as of 10/19/2016  . (No Known Allergies)    Family History  Problem Relation Age of Onset  . Heart disease Mother   . Lung cancer Father   . Colon cancer Neg Hx     Social History   Social History  . Marital status: Single    Spouse name: N/A  . Number of children: N/A  . Years of education: N/A   Occupational History  . Not on file.   Social History Main Topics  . Smoking status: Never Smoker  . Smokeless tobacco: Never Used  . Alcohol use 1.2 - 2.4 oz/week    2 - 4 Standard drinks or equivalent per week     Comment: GLASS WINE A NIGHT  . Drug use: No  . Sexual activity: Not on file   Other Topics Concern  . Not on file   Social History Narrative  . No narrative on file    Review of Systems:  All systems reviewed an negative except where noted in HPI.  Gen: Denies any fever, chills, sweats, anorexia, fatigue, weakness, malaise, weight loss, and sleep disorder CV: Denies chest pain, angina, palpitations, syncope, orthopnea, PND, peripheral edema, and claudication. Resp: Denies dyspnea at rest, dyspnea with exercise, cough, sputum, wheezing, coughing up  blood, and pleurisy. GI: Denies vomiting blood, jaundice, and fecal incontinence.    GU : Denies urinary burning, blood in urine, urinary frequency, urinary hesitancy, nocturnal urination, and urinary incontinence. MS: Denies joint pain, limitation of movement, and swelling, stiffness, low back pain, extremity pain. Denies muscle weakness, cramps, atrophy.  Derm: Denies rash, itching, dry skin, hives, moles, warts, or unhealing ulcers.  Psych: Denies depression, anxiety, memory loss, suicidal ideation, hallucinations, paranoia, and confusion. Heme: Denies bruising, bleeding, and enlarged lymph nodes. Neuro:  Denies any headaches, dizziness, paresthesias. Endo:  Denies any  problems with DM, thyroid, adrenal function.  Physical Exam: Vital signs in last 24 hours: Temp:  [98.2 F (36.8 C)] 98.2 F (36.8 C) (06/20 1245) Pulse Rate:  [84] 84 (06/20 1245) Resp:  [17] 17 (06/20 1245) BP: (144)/(76) 144/76 (06/20 1245) SpO2:  [97 %] 97 % (06/20 1245) Weight:  [280 lb (127 kg)] 280 lb (127 kg) (06/20 1245)   General:  Alert, well-developed, in NAD Head:  Normocephalic and atraumatic. Eyes:  Sclera clear, no icterus.   Conjunctiva pink. Ears:  Normal auditory acuity. Mouth:  No deformity or lesions.  Neck:  Supple; no masses . Lungs:  Clear throughout to auscultation.   No wheezes, crackles, or rhonchi. No acute distress. Heart:  Regular rate and rhythm; no murmurs. Abdomen:  Soft, nondistended, nontender. No masses, hepatomegaly. No obvious masses.  Normal bowel .    Rectal: not done Msk:  Symmetrical without gross deformities.. Pulses:  Normal pulses noted. Extremities:  Without edema. Neurologic:  Alert and  oriented x4;  grossly normal neurologically. Skin:  Intact without significant lesions or rashes. Cervical Nodes:  No significant cervical adenopathy. Psych:  Alert and cooperative. Normal mood and affect.  Lab Results:  Recent Labs  10/17/16 2129  WBC 9.6  HGB 13.8  HCT 40.0  PLT 206   BMET  Recent Labs  10/17/16 2129  NA 138  K 4.2  CL 106  CO2 25  GLUCOSE 129*  BUN 18  CREATININE 0.87  CALCIUM 10.8*    Studies/Results: Dg Neck Soft Tissue  Result Date: 10/18/2016 CLINICAL DATA:  56-year-old male with sensation of food getting stuck in throat for 3 days. No pain or swelling. EXAM: NECK SOFT TISSUES - 1+ VIEW COMPARISON:  None. FINDINGS: There is no evidence of retropharyngeal soft tissue swelling or epiglottic enlargement. The cervical airway is unremarkable and no radio-opaque foreign body identified. There is mild anterior ossified at C5-C6. IMPRESSION: Unremarkable soft tissues and patent airways. No radiopaque foreign  object. Small anterior osteophyte at C5-C6. Electronically Signed   By: Anner Crete M.D.   On: 10/18/2016 01:43   Dg Esophagus  Result Date: 10/18/2016 CLINICAL DATA:  History of gastroesophageal reflux, Barrett's esophagus, hiatal hernia, dysphagia x3 days, chicken salad lodged in throat yesterday EXAM: ESOPHOGRAM / BARIUM SWALLOW / BARIUM TABLET STUDY TECHNIQUE: Combined double contrast and single contrast examination performed using effervescent crystals, thick barium liquid, and thin barium liquid. The patient was observed with fluoroscopy swallowing a 13 mm barium sulphate tablet. FLUOROSCOPY TIME:  Fluoroscopy Time: 2 minutes low-dose pulsed fluoroscopy Radiation Exposure Index (if provided by the fluoroscopic device): 55.4 mGy Number of Acquired Spot Images: 13 COMPARISON:  None. FINDINGS: Moderate esophageal dysmotility. Short segment narrowing/ stricture at the distal esophagus just above the GE junction. A 13 mm barium tablet lodged at this location and did not pass. Small hiatal hernia. IMPRESSION: Short segment narrowing/ stricture of the distal esophagus just above the  GE junction. The 13 mm barium tablet lodged at this location and did not pass. Small hiatal hernia. Moderate esophageal dysmotility. Electronically Signed   By: Julian Hy M.D.   On: 10/18/2016 11:33    Impression / Plan:   1. Dysphagia and new esophageal stricture. EGD/possible dilation today for further evaluation. The risks (including bleeding, perforation, infection, missed lesions, medication reactions and possible hospitalization or surgery if complications occur), benefits, and alternatives to endoscopy with possible biopsy and possible dilation were discussed with the patient and they consent to proceed.     LOS: 0 days   Malcolm T. Fuller Plan MD 10/19/2016, 1:17 PM [phone] Mon-Fri 8a-5p  [phone] after 5p, weekends, holidays

## 2016-10-19 NOTE — Discharge Instructions (Signed)
YOU HAD AN ENDOSCOPIC PROCEDURE TODAY: Refer to the procedure report and other information in the discharge instructions given to you for any specific questions about what was found during the examination. If this information does not answer your questions, please call [facility] office at [phone] to clarify.  ° °YOU SHOULD EXPECT: Some feelings of bloating in the abdomen. Passage of more gas than usual. Walking can help get rid of the air that was put into your GI tract during the procedure and reduce the bloating. If you had a lower endoscopy (such as a colonoscopy or flexible sigmoidoscopy) you may notice spotting of blood in your stool or on the toilet paper. Some abdominal soreness may be present for a day or two, also. ° °DIET: Your first meal following the procedure should be a light meal and then it is ok to progress to your normal diet. A half-sandwich or bowl of soup is an example of a good first meal. Heavy or fried foods are harder to digest and may make you feel nauseous or bloated. Drink plenty of fluids but you should avoid alcoholic beverages for 24 hours. If you had a esophageal dilation, please see attached instructions for diet.   ° °ACTIVITY: Your care partner should take you home directly after the procedure. You should plan to take it easy, moving slowly for the rest of the day. You can resume normal activity the day after the procedure however YOU SHOULD NOT DRIVE, use power tools, machinery or perform tasks that involve climbing or major physical exertion for 24 hours (because of the sedation medicines used during the test).  ° °SYMPTOMS TO REPORT IMMEDIATELY: °A gastroenterologist can be reached at any hour. Please call [phone]  for any of the following symptoms:  °Following lower endoscopy (colonoscopy, flexible sigmoidoscopy) °Excessive amounts of blood in the stool  °Significant tenderness, worsening of abdominal pains  °Swelling of the abdomen that is new, acute  °Fever of 100° or  higher  °Following upper endoscopy (EGD, EUS, ERCP, esophageal dilation) °Vomiting of blood or coffee ground material  °New, significant abdominal pain  °New, significant chest pain or pain under the shoulder blades  °Painful or persistently difficult swallowing  °New shortness of breath  °Black, tarry-looking or red, bloody stools ° °FOLLOW UP:  °If any biopsies were taken you will be contacted by phone or by letter within the next 1-3 weeks. Call [phone]  if you have not heard about the biopsies in 3 weeks.  °Please also call with any specific questions about appointments or follow up tests. ° °

## 2016-10-24 ENCOUNTER — Encounter (HOSPITAL_COMMUNITY): Payer: Self-pay | Admitting: Gastroenterology

## 2016-10-24 NOTE — Anesthesia Postprocedure Evaluation (Signed)
Anesthesia Post Note  Patient: Philip [name]  Procedure(s) Performed: Procedure(s) (LRB): ESOPHAGOGASTRODUODENOSCOPY (EGD) (N/A) BALLOON DILATION (N/A)     Patient location during evaluation: Endoscopy Anesthesia Type: MAC Level of consciousness: awake Pain management: pain level controlled Vital Signs Assessment: post-procedure vital signs reviewed and stable Respiratory status: spontaneous breathing, nonlabored ventilation, respiratory function stable and patient connected to nasal cannula oxygen Cardiovascular status: stable and blood pressure returned to baseline Anesthetic complications: no    Last Vitals:  Vitals:   10/19/16 1500 10/19/16 1505  BP: 118/65   Pulse: 91 88  Resp: 19 18  Temp:      Last Pain:  Vitals:   10/20/16 1619  TempSrc:   PainSc: 0-No pain                 Tiawana Forgy

## 2016-10-27 ENCOUNTER — Ambulatory Visit (INDEPENDENT_AMBULATORY_CARE_PROVIDER_SITE_OTHER): Payer: Medicare Other | Admitting: Gastroenterology

## 2016-10-27 ENCOUNTER — Encounter: Payer: Self-pay | Admitting: Gastroenterology

## 2016-10-27 VITALS — BP 132/68 | HR 86 | Ht 72.0 in | Wt 291.0 lb

## 2016-10-27 DIAGNOSIS — K219 Gastro-esophageal reflux disease without esophagitis: Secondary | ICD-10-CM | POA: Diagnosis not present

## 2016-10-27 DIAGNOSIS — K22710 Barrett's esophagus with low grade dysplasia: Secondary | ICD-10-CM

## 2016-10-27 DIAGNOSIS — K222 Esophageal obstruction: Secondary | ICD-10-CM

## 2016-10-27 NOTE — Progress Notes (Signed)
10/27/2016 Philip [name] [id number] March 29, 1946   HISTORY OF PRESENT ILLNESS:  This is a pleasant 71 year old male who is known to Dr. Loletha Carrow. Has long-standing GERD and apparently a long-standing diagnosis of Barrett's esophagus as well. He was recently having dysphagia and an esophagram showed short segment narrowing/stricture at the distal esophagus just above the GE junction where a 13 mm barium tablet got lodged and did not pass. Also had a small hiatal hernia and moderate esophageal dysmotility. He underwent EGD the following day, June 20, by Dr. Fuller Plan. At that time he was found to have changes consistent with short segment Barrett's esophagus that was biopsied. He had a benign-appearing stenosis and inflammation at the GE junction that was dilated. Also had nodular gastritis that was biopsied and duodenal submucosal nodule. Biopsies of the esophagus confirmed Barrett's with low-grade dysplasia. Gastric biopsies showed reactive gastropathy without H. pylori. He is here today for follow-up. He says that since his dilation his dysphasia has completely resolved. He continues on Nexium 40 mg daily OTC. He says that he believes he is tried other PPIs in the past that just didn't work out as well for him.   Past Medical History:  Diagnosis Date  . Abdominal hernia   . Asthma   . Barrett's esophagus   . Cough    over 2 weeks  . Fluid retention in legs   . H/O hiatal hernia   . SOB (shortness of breath)    Past Surgical History:  Procedure Laterality Date  . BALLOON DILATION N/A 10/19/2016   Procedure: BALLOON DILATION;  Surgeon: Ladene Artist, MD;  Location: [facility];  Service: Endoscopy;  Laterality: N/A;  . ESOPHAGOGASTRODUODENOSCOPY N/A 10/19/2016   Procedure: ESOPHAGOGASTRODUODENOSCOPY (EGD);  Surgeon: Ladene Artist, MD;  Location: [facility];  Service: Endoscopy;  Laterality: N/A;  . LUMBAR LAMINECTOMY     1980's    reports that he has never smoked. He has never used  smokeless tobacco. He reports that he drinks about 1.2 - 2.4 oz of alcohol per week . He reports that he does not use drugs. family history includes Heart disease in his mother; Lung cancer in his father. No Known Allergies    Outpatient Encounter Prescriptions as of 10/27/2016  Medication Sig  . amitriptyline (ELAVIL) 25 MG tablet Take 3-4 tablets prn  . aspirin EC 81 MG tablet Take 81 mg by mouth daily.  [name] esomeprazole (NEXIUM) 40 MG capsule Take 40 mg by mouth daily at 12 noon.  [name] lisinopril-hydrochlorothiazide (PRINZIDE,ZESTORETIC) 20-25 MG per tablet   . Multiple Vitamin (MULTIVITAMIN) tablet Take 1 tablet by mouth daily.  . traMADol (ULTRAM) 50 MG tablet Take 50 mg by mouth every 6 (six) hours as needed for pain.  . [DISCONTINUED] hydrochlorothiazide (HYDRODIURIL) 25 MG tablet Take 25 mg by mouth daily. Reported on 07/14/2015  . [DISCONTINUED] predniSONE (DELTASONE) 20 MG tablet Take one tab by mouth twice daily for 5 days, then one daily for 3 days. Take with food.   No facility-administered encounter medications on file as of 10/27/2016.      REVIEW OF SYSTEMS  : All other systems reviewed and negative except where noted in the History of Present Illness.   PHYSICAL EXAM: BP 132/68 (BP Location: Right Arm, Patient Position: Sitting, Cuff Size: Normal)   Pulse 86   Ht 6' (1.829 m)   Wt 291 lb (132 kg)   BMI 39.47 kg/m  General: Well developed white male in no acute distress  Head: Normocephalic and atraumatic Eyes:  Sclerae anicteric, conjunctiva pink. Ears: Normal auditory acuity Lungs: Clear throughout to auscultation; no increased WOB. Heart: Regular rate and rhythm Abdomen: Soft, obese, non-distended. Normal bowel sounds.  Non-tender. Musculoskeletal: Symmetrical with no gross deformities  Skin: No lesions on visible extremities Extremities: No edema  Neurological: Alert oriented x 4, grossly non-focal Psychological:  Alert and cooperative. Normal mood and  affect  ASSESSMENT AND PLAN: -71 year old male with Barrett's esophagus with low-grade dysplasia. Also had an esophageal stricture that was recently dilated. The patient's dysphagia has resolved. He will continue Nexium 40 mg daily. He has been placed in for an endoscopy recall for 6 months. He appears he is due for colonoscopy later this year as well so hopefully this procedures can be performed together.  CC:  Olin Hauser, MD

## 2016-10-27 NOTE — Patient Instructions (Signed)
Continue your Nexium daily.

## 2016-10-29 NOTE — Progress Notes (Signed)
Thank you for sending this case to me. I have reviewed the entire note, and the outlined [name] is what we discussed. I reviewed Philip [name] EGD and the pathology report, and discussed the findings with Philip [name].  We will coordinate Philip [name]'s EGD and colonoscopy for the end of this year. Philip Lund, MD

## 2016-12-21 ENCOUNTER — Encounter (INDEPENDENT_AMBULATORY_CARE_PROVIDER_SITE_OTHER): Payer: Self-pay

## 2016-12-21 ENCOUNTER — Ambulatory Visit (INDEPENDENT_AMBULATORY_CARE_PROVIDER_SITE_OTHER): Payer: Medicare Other | Admitting: Diagnostic Neuroimaging

## 2016-12-21 ENCOUNTER — Encounter: Payer: Self-pay | Admitting: Diagnostic Neuroimaging

## 2016-12-21 VITALS — BP 104/67 | HR 86 | Ht 72.0 in | Wt 270.8 lb

## 2016-12-21 DIAGNOSIS — R471 Dysarthria and anarthria: Secondary | ICD-10-CM | POA: Diagnosis not present

## 2016-12-21 DIAGNOSIS — H532 Diplopia: Secondary | ICD-10-CM

## 2016-12-21 DIAGNOSIS — R1310 Dysphagia, unspecified: Secondary | ICD-10-CM

## 2016-12-21 DIAGNOSIS — R5383 Other fatigue: Secondary | ICD-10-CM | POA: Diagnosis not present

## 2016-12-21 MED ORDER — PYRIDOSTIGMINE BROMIDE 60 MG PO TABS: 30.0000 mg | ORAL_TABLET | Freq: Three times a day (TID) | ORAL | 3 refills | Status: DC

## 2016-12-21 NOTE — Progress Notes (Addendum)
[facility]  PATIENT: Philip [name] DOB: 06-08-1945  REFERRING CLINICIAN: Delman Cheadle, J HISTORY FROM: patient and chart review REASON FOR VISIT: new consult    HISTORICAL  CHIEF COMPLAINT:  Chief Complaint  Patient presents with  . Double vision    rm 7, "began 1 week ago    HISTORY OF PRESENT ILLNESS:   71 year old male here for evaluation of double vision.  Her past to 3 weeks patient has had intermittent double vision.  Symptoms present with both eyes open but improve if he closes one eye or uses an eye patch. He describes objects as being up and to the side from each other.  They slightly overlap.  This fluctuates.  Symptoms worse later in the day.  He also had some swallowing difficulties for the past 2 months without a specific cause to be found. He has some generalized fatigue as well.  Patient went to [facility] eye clinic, with no specific ocular pathology found.  Patient was referred here for consideration of possible neuromuscular, neuro inflammatory or neuro vascular cause of symptoms.  No unilateral numbness or weakness.  No sudden strokelike symptoms.  No headaches.  No prodromal factors.   REVIEW OF SYSTEMS: Full 14 system review of systems performed and negative with exception of: fatigue trouble swallowing double vision.  ALLERGIES: No Known Allergies  HOME MEDICATIONS: Outpatient Medications Prior to Visit  Medication Sig Dispense Refill  . amitriptyline (ELAVIL) 25 MG tablet Take 3-4 tablets prn    . aspirin EC 81 MG tablet Take 81 mg by mouth daily.    [name] esomeprazole (NEXIUM) 40 MG capsule Take 40 mg by mouth daily at 12 noon.    [name] lisinopril-hydrochlorothiazide (PRINZIDE,ZESTORETIC) 20-25 MG per tablet     . Multiple Vitamin (MULTIVITAMIN) tablet Take 1 tablet by mouth daily.    . traMADol (ULTRAM) 50 MG tablet Take 50 mg by mouth every 6 (six) hours as needed for pain.     No facility-administered medications prior to visit.     PAST  MEDICAL HISTORY: Past Medical History:  Diagnosis Date  . Abdominal hernia   . Asthma   . Barrett's esophagus   . Cough    over 2 weeks  . Fluid retention in legs   . H/O hiatal hernia   . SOB (shortness of breath)     PAST SURGICAL HISTORY: Past Surgical History:  Procedure Laterality Date  . BALLOON DILATION N/A 10/19/2016   Procedure: BALLOON DILATION;  Surgeon: Ladene Artist, MD;  Location: [facility];  Service: Endoscopy;  Laterality: N/A;  . ESOPHAGOGASTRODUODENOSCOPY N/A 10/19/2016   Procedure: ESOPHAGOGASTRODUODENOSCOPY (EGD);  Surgeon: Ladene Artist, MD;  Location: [facility];  Service: Endoscopy;  Laterality: N/A;  . LUMBAR LAMINECTOMY     1980's    FAMILY HISTORY: Family History  Problem Relation Age of Onset  . Heart disease Mother   . Lung cancer Father   . Colon cancer Neg Hx     SOCIAL HISTORY:  Social History   Social History  . Marital status: Single    Spouse name: N/A  . Number of children: 0  . Years of education: 12   Occupational History  .      retired   Social History Main Topics  . Smoking status: Former Smoker    Quit date: 12/21/1976  . Smokeless tobacco: Never Used  . Alcohol use 1.2 - 2.4 oz/week    2 - 4 Standard drinks or equivalent  per week     Comment: GLASS WINE A NIGHT, 12/21/16 1 oz /wk  . Drug use: No  . Sexual activity: Not on file   Other Topics Concern  . Not on file   Social History Narrative   Lives alone   Caffeine- coffee, tea, sodas 1/2 gallon total daily     PHYSICAL EXAM  GENERAL EXAM/CONSTITUTIONAL: Vitals:  Vitals:   12/21/16 1510  BP: 104/67  Pulse: 86  Weight: 270 lb 12.8 oz (122.8 kg)  Height: 6' (1.829 m)     Body mass index is 36.73 kg/m.  Visual Acuity Screening   Right eye Left eye Both eyes  Without correction:     With correction: 20/20 20/30      Patient is in no distress; well developed, nourished and groomed; neck is supple  CARDIOVASCULAR:  Examination of  carotid arteries is normal; no carotid bruits  Regular rate and rhythm, no murmurs  Examination of peripheral vascular system by observation and palpation is normal  EYES:  Ophthalmoscopic exam of optic discs and posterior segments is normal; no papilledema or hemorrhages  MUSCULOSKELETAL:  Gait, strength, tone, movements noted in Neurologic exam below  NEUROLOGIC: MENTAL STATUS:  No flowsheet data found.  awake, alert, oriented to person, place and time  recent and remote memory intact  normal attention and concentration  language fluent, comprehension intact, naming intact,   fund of knowledge appropriate  CRANIAL NERVE:   2nd - no papilledema on fundoscopic exam  2nd, 3rd, 4th, 6th - pupils equal and reactive to light, visual fields full to confrontation, extraocular muscles intact, no nystagmus; LEFT PTOSIS; LEFT PTOSIS INCREASES AFTER 1 MINUTE UPGAZE; NO DOUBLE VISION BEFORE OR AFTER EXERTION  RIGHT EYE PROPTOSIS; RIGHT EYE OPENED WIDER THAN LEFT; SIMILAR TO DRIVER LICENSE PHOTO FROM 08/04/99  5th - facial sensation symmetric  7th - facial strength symmetric  8th - hearing intact  9th - palate elevates symmetrically, uvula midline  11th - shoulder shrug symmetric  12th - tongue protrusion midline  HOARSE VOICE  MOTOR:   normal bulk and tone, full strength in the BUE, BLE; EXCEPT LIMITED IN RIGHT DELTOID DUE TO PAIN  SENSORY:   normal and symmetric to light touch, temperature  ABSENT VIBRATION IN TOES  COORDINATION:   finger-nose-finger, fine finger movements SLOW  REFLEXES:   deep tendon reflexes TRACE and symmetric; ABSENT AT ANKLES  GAIT/STATION:   ANTALGIC GAIT; USES SINGLE POINT CANE    DIAGNOSTIC DATA (LABS, IMAGING, TESTING) - I reviewed patient records, labs, notes, testing and imaging myself where available.  Lab Results  Component Value Date   WBC 9.6 10/17/2016   HGB 13.8 10/17/2016   HCT 40.0 10/17/2016   MCV 94.1  10/17/2016   PLT 206 10/17/2016      Component Value Date/Time   NA 138 10/17/2016 2129   K 4.2 10/17/2016 2129   CL 106 10/17/2016 2129   CO2 25 10/17/2016 2129   GLUCOSE 129 (H) 10/17/2016 2129   BUN 18 10/17/2016 2129   CREATININE 0.87 10/17/2016 2129   CALCIUM 10.8 (H) 10/17/2016 2129   PROT 6.2 05/09/2007 0937   ALBUMIN 3.7 05/09/2007 0937   AST 23 05/09/2007 0937   ALT 30 05/09/2007 0937   ALKPHOS 61 05/09/2007 0937   BILITOT 1.0 05/09/2007 0937   GFRNONAA >60 10/17/2016 2129   GFRAA >60 10/17/2016 2129   No results found for: CHOL, HDL, LDLCALC, LDLDIRECT, TRIG, CHOLHDL No results found for: HGBA1C No  results found for: VITAMINB12 No results found for: TSH      ASSESSMENT AND PLAN  71 y.o. year old male here with fatigable, fluctuating, intermittent double vision for past 2-3 weeks, with approximately 2 months of swallowing difficulty.  Also with generalized weakness intermittently.  Findings suspicious for neuromuscular junction disorder. We'll proceed with further workup.   Ddx: neuromuscular junction disorder (myasthenia gravis) vs cranial neuropathies vs brainstem lesion / stroke  1. Double vision   2. Dysarthria   3. Dysphagia, unspecified type   4. Other fatigue      PLAN: - check AchR antibodies, TSH, A1c, CK, aldolase - check MRI brain - empiric trial with pyridostigmine 30-60mg  (daily up to three times per day)  Orders Placed This Encounter  Procedures  . MR BRAIN W WO CONTRAST  . Acetylcholine Receptor, Binding  . Hemoglobin A1c  . TSH  . CK  . Aldolase   Meds ordered this encounter  Medications  . pyridostigmine (MESTINON) 60 MG tablet    Sig: Take 0.5-1 tablets (30-60 mg total) by mouth 3 (three) times daily.    Dispense:  60 tablet    Refill:  3   Return in about 1 month (around 01/21/2017).    Penni Bombard, MD 8/36/6294, 7:65 PM Certified in Neurology, Neurophysiology and Neuroimaging  [facility] [address] [place], [place] [address] [phone]

## 2016-12-21 NOTE — Patient Instructions (Signed)
Thank you for coming to see [place] at [facility]. I hope we have been able to provide you high quality care today.  You may receive a patient satisfaction survey over the next few weeks. We would appreciate your feedback and comments so that we may continue to improve ourselves and the health of our patients.  - check labs today   - check MRI brain  - try pyridostigmine 30-58m (daily up to three times per day)   ~~~~~~~~~~~~~~~~~~~~~~~~~~~~~~~~~~~~~~~~~~~~~~~~~~~~~~~~~~~~~~~~~  DR. PENUMALLI'S GUIDE TO HAPPY AND HEALTHY LIVING These are some of my general health and wellness recommendations. Some of them may apply to you better than others. Please use common sense as you try these suggestions and feel free to ask me any questions.   ACTIVITY/FITNESS Mental, social, emotional and physical stimulation are very important for brain and body health. Try learning a new activity (arts, music, language, sports, games).  Keep moving your body to the best of your abilities. You can do this at home, inside or outside, the park, community center, gym or anywhere you like. Consider a physical therapist or personal trainer to get started. Consider the app Sworkit. Fitness trackers such as smart-watches, smart-phones or Fitbits can help as well.   NUTRITION Eat more plants: colorful vegetables, nuts, seeds and berries.  Eat less sugar, salt, preservatives and processed foods.  Avoid toxins such as cigarettes and alcohol.  Drink water when you are thirsty. Warm water with a slice of lemon is an excellent morning drink to start the day.  Consider these websites for more information The Nutrition Source (hhttps://www.henry-hernandez.biz/ [employer] Nutrition (wWindowBlog.ch   RELAXATION Consider practicing mindfulness meditation or other relaxation techniques such as deep breathing, prayer, yoga, tai chi, massage. See website mindful.org  or the apps Headspace or Calm to help get started.   SLEEP Try to get at least 7-8+ hours sleep per day. Regular exercise and reduced caffeine will help you sleep better. Practice good sleep hygeine techniques. See website sleep.org for more information.   PLANNING Prepare estate planning, living will, healthcare POA documents. Sometimes this is best planned with the help of an attorney. Theconversationproject.org and agingwithdignity.org are excellent resources.

## 2016-12-26 LAB — CK: Total CK: 100 U/L (ref 24–204)

## 2016-12-26 LAB — TSH: TSH: 2.04 u[IU]/mL (ref 0.450–4.500)

## 2016-12-26 LAB — ACETYLCHOLINE RECEPTOR, BINDING: ACHR BINDING AB, SERUM: 65.6 nmol/L — AB (ref 0.00–0.24)

## 2016-12-26 LAB — ALDOLASE: Aldolase: 4.5 U/L (ref 3.3–10.3)

## 2016-12-26 LAB — HEMOGLOBIN A1C
Est. average glucose Bld gHb Est-mCnc: 108 mg/dL
Hgb A1c MFr Bld: 5.4 % (ref 4.8–5.6)

## 2016-12-27 ENCOUNTER — Telehealth: Payer: Self-pay | Admitting: *Deleted

## 2016-12-27 NOTE — Telephone Encounter (Signed)
Spoke with patient and informed him that his labs confirm myasthenia gravis. Advised he  continue pyridostigmine and follow up with Dr Leta Baptist on 01/18/17. Reminded him of his MRI on 9/111/18 and advised Dr Leta Baptist can review labs and MRI results at his follow up. Patient verbalized understanding, appreciation.

## 2017-01-04 ENCOUNTER — Emergency Department (HOSPITAL_COMMUNITY): Payer: Medicare Other

## 2017-01-04 ENCOUNTER — Encounter (HOSPITAL_COMMUNITY): Payer: Self-pay

## 2017-01-04 ENCOUNTER — Emergency Department (HOSPITAL_COMMUNITY)
Admission: EM | Admit: 2017-01-04 | Discharge: 2017-01-04 | Disposition: A | Discharge status: Home / Self Care | Payer: Medicare Other | Attending: Emergency Medicine | Admitting: Emergency Medicine

## 2017-01-04 DIAGNOSIS — J00 Acute nasopharyngitis [common cold]: Secondary | ICD-10-CM | POA: Diagnosis not present

## 2017-01-04 DIAGNOSIS — Z79899 Other long term (current) drug therapy: Secondary | ICD-10-CM | POA: Insufficient documentation

## 2017-01-04 DIAGNOSIS — Z87891 Personal history of nicotine dependence: Secondary | ICD-10-CM | POA: Insufficient documentation

## 2017-01-04 DIAGNOSIS — J45909 Unspecified asthma, uncomplicated: Secondary | ICD-10-CM | POA: Insufficient documentation

## 2017-01-04 DIAGNOSIS — Z7982 Long term (current) use of aspirin: Secondary | ICD-10-CM | POA: Insufficient documentation

## 2017-01-04 DIAGNOSIS — R05 Cough: Secondary | ICD-10-CM | POA: Diagnosis present

## 2017-01-04 LAB — BASIC METABOLIC PANEL
Anion gap: 8 (ref 5–15)
BUN: 20 mg/dL (ref 6–20)
CHLORIDE: 105 mmol/L (ref 101–111)
CO2: 25 mmol/L (ref 22–32)
CREATININE: 0.87 mg/dL (ref 0.61–1.24)
Calcium: 11.1 mg/dL — ABNORMAL HIGH (ref 8.9–10.3)
GFR calc Af Amer: >60 mL/min (ref >60)
GFR calc non Af Amer: >60 mL/min (ref >60)
Glucose, Bld: 124 mg/dL — ABNORMAL HIGH (ref 65–99)
Potassium: 4 mmol/L (ref 3.5–5.1)
SODIUM: 138 mmol/L (ref 135–145)

## 2017-01-04 LAB — I-STAT CHEM 8, ED
BUN: 20 mg/dL (ref 6–20)
CALCIUM ION: 1.4 mmol/L (ref 1.15–1.40)
CREATININE: 0.8 mg/dL (ref 0.61–1.24)
Chloride: 104 mmol/L (ref 101–111)
GLUCOSE: 122 mg/dL — AB (ref 65–99)
HCT: 37 % — ABNORMAL LOW (ref 39.0–52.0)
Hemoglobin: 12.6 g/dL — ABNORMAL LOW (ref 13.0–17.0)
Potassium: 3.9 mmol/L (ref 3.5–5.1)
Sodium: 138 mmol/L (ref 135–145)
TCO2: 25 mmol/L (ref 22–32)

## 2017-01-04 LAB — CBC WITH DIFFERENTIAL/PLATELET
Basophils Absolute: 0 10*3/uL (ref 0.0–0.1)
Basophils Relative: 0 %
EOS ABS: 0.2 10*3/uL (ref 0.0–0.7)
Eosinophils Relative: 2 %
HCT: 37.5 % — ABNORMAL LOW (ref 39.0–52.0)
HEMOGLOBIN: 12.8 g/dL — AB (ref 13.0–17.0)
LYMPHS ABS: 0.6 10*3/uL — AB (ref 0.7–4.0)
Lymphocytes Relative: 6 %
MCH: 31.8 pg (ref 26.0–34.0)
MCHC: 34.1 g/dL (ref 30.0–36.0)
MCV: 93.3 fL (ref 78.0–100.0)
MONOS PCT: 7 %
Monocytes Absolute: 0.6 10*3/uL (ref 0.1–1.0)
NEUTROS PCT: 85 %
Neutro Abs: 7.6 10*3/uL (ref 1.7–7.7)
Platelets: 178 10*3/uL (ref 150–400)
RBC: 4.02 MIL/uL — ABNORMAL LOW (ref 4.22–5.81)
RDW: 13.7 % (ref 11.5–15.5)
WBC: 9 10*3/uL (ref 4.0–10.5)

## 2017-01-04 LAB — I-STAT TROPONIN, ED: Troponin i, poc: 0.01 ng/mL (ref 0.00–0.08)

## 2017-01-04 LAB — I-STAT CG4 LACTIC ACID, ED: Lactic Acid, Venous: 1.5 mmol/L (ref 0.5–1.9)

## 2017-01-04 MED ORDER — PREDNISONE 20 MG PO TABS: ORAL_TABLET | ORAL | 0 refills | Status: DC

## 2017-01-04 MED ORDER — PREDNISONE 20 MG PO TABS
60.0000 mg | ORAL_TABLET | Freq: Once | ORAL | Status: AC
  Administered 2017-01-04: 60 mg via ORAL
  Filled 2017-01-04: qty 3

## 2017-01-04 MED ORDER — LIDOCAINE VISCOUS 2 % MT SOLN
15.0000 mL | Freq: Once | OROMUCOSAL | Status: AC
  Administered 2017-01-04: 15 mL via OROMUCOSAL
  Filled 2017-01-04: qty 15

## 2017-01-04 NOTE — Discharge Instructions (Signed)
Try Sudafed or Afrin. Both of these medications can mess with your blood pressure. Will use them for a couple days. Follow up with her family physician.  Take tylenol 2 pills 4 times a day and motrin 4 pills 3 times a day.  Drink plenty of fluids.  Return for worsening shortness of breath, headache, confusion. Follow up with your family doctor.

## 2017-01-04 NOTE — ED Triage Notes (Signed)
Patient is from home and transported via [facility]. Per EMS, patient is experiencing a productive cough, respiratory distress for the last 5 days, and unmeasured fever. EMS gave a DUONEB and initiated an IV. Patient is alert, oriented x 4, and appears in no acute distress. Respirations are even, regular, unlabored. Patient was able to get into a wheelchair until a triage room is available.

## 2017-01-04 NOTE — ED Triage Notes (Signed)
He states he has "laryngeal spasms". He is in no distress.

## 2017-01-04 NOTE — ED Notes (Signed)
Patient was alert, oriented and stable upon discharge. RN went over AVS and patient had no further questions.  

## 2017-01-04 NOTE — ED Notes (Signed)
Patient transported to X-ray 

## 2017-01-04 NOTE — ED Provider Notes (Signed)
[facility] DEPT Provider Note   CSN: [id number] Arrival date & time: 01/04/17  1536     History   Chief Complaint Chief Complaint  Patient presents with  . URI    HPI Philip [name] is a 71 y.o. male.  71 yo M with a chief complaint of cough. He describes it as a laryngeal spasm. He's been having this issue ongoing secondary to a likely neuro degenerative disorder. They think that he has myasthenia gravis. Has not had a MRI performed yet. Symptoms of been going on for the past week.He has been coughing and congested. Had a posterior nasal drip.   The history is provided by the patient.  Illness  This is a new problem. The current episode started more than 2 days ago. The problem occurs constantly. The problem has been gradually worsening. Pertinent negatives include no chest pain, no abdominal pain, no headaches and no shortness of breath. Nothing aggravates the symptoms. Nothing relieves the symptoms. He has tried nothing for the symptoms. The treatment provided no relief.    Past Medical History:  Diagnosis Date  . Abdominal hernia   . Asthma   . Barrett's esophagus   . Cough    over 2 weeks  . Fluid retention in legs   . H/O hiatal hernia   . SOB (shortness of breath)     Patient Active Problem List   Diagnosis Date Noted  . Esophageal stricture 10/27/2016  . Dysphagia   . Abnormal esophagram   . ESOPHAGEAL REFLUX 05/08/2009  . BARRETTS ESOPHAGUS 05/08/2009  . DIVERTICULOSIS OF COLON 05/08/2009  . ABDOMINAL BLOATING 05/08/2009    Past Surgical History:  Procedure Laterality Date  . BALLOON DILATION N/A 10/19/2016   Procedure: BALLOON DILATION;  Surgeon: Ladene Artist, MD;  Location: [facility];  Service: Endoscopy;  Laterality: N/A;  . ESOPHAGOGASTRODUODENOSCOPY N/A 10/19/2016   Procedure: ESOPHAGOGASTRODUODENOSCOPY (EGD);  Surgeon: Ladene Artist, MD;  Location: [facility];  Service: Endoscopy;  Laterality: N/A;  . LUMBAR LAMINECTOMY     1980's         Home Medications    Prior to Admission medications   Medication Sig Start Date End Date Taking? Authorizing Provider  amitriptyline (ELAVIL) 25 MG tablet Take 75-100 mg by mouth 4 (four) times daily as needed for sleep.  04/01/12  Yes [provider]  aspirin EC 81 MG tablet Take 81 mg by mouth daily.   Yes [provider]  atorvastatin (LIPITOR) 10 MG tablet Take 10 mg by mouth daily.  03/22/16  Yes [provider]  diclofenac (VOLTAREN) 75 MG EC tablet Take 75 mg by mouth daily. 09/22/16  Yes [provider]  esomeprazole (NEXIUM) 40 MG capsule Take 40 mg by mouth daily at 12 noon.   Yes [provider]  etodolac (LODINE) 500 MG tablet Take 500 mg by mouth daily.  11/07/13  Yes [provider]  fluticasone furoate-vilanterol (BREO ELLIPTA) 200-25 MCG/INH AEPB Inhale 1 puff into the lungs daily.  06/26/15  Yes [provider]  lisinopril-hydrochlorothiazide (PRINZIDE,ZESTORETIC) 20-25 MG per tablet Take 1 tablet by mouth daily.  03/23/12  Yes [provider]  Multiple Vitamin (MULTIVITAMIN) tablet Take 1 tablet by mouth daily.   Yes [provider]  pyridostigmine (MESTINON) 60 MG tablet Take 0.5-1 tablets (30-60 mg total) by mouth 3 (three) times daily. 12/21/16  Yes Penumalli, Earlean Polka, MD  traMADol (ULTRAM) 50 MG tablet Take 50 mg by mouth every 6 (six) hours  as needed for pain.   Yes [provider]  predniSONE (DELTASONE) 20 MG tablet 2 tabs po daily x 4 days 01/04/17   [name], DO    Family History Family History  Problem Relation Age of Onset  . Heart disease Mother   . Lung cancer Father   . Colon cancer Neg Hx     Social History Social History  Substance Use Topics  . Smoking status: Former Smoker    Quit date: 12/21/1976  . Smokeless tobacco: Never Used  . Alcohol use 1.2 - 2.4 oz/week    2 - 4 Standard drinks or equivalent per week     Comment: GLASS WINE A NIGHT, 12/21/16 1 oz  /wk     Allergies   Patient has no known allergies.   Review of Systems Review of Systems  Constitutional: Positive for fever. Negative for chills.  HENT: Positive for congestion. Negative for facial swelling.   Eyes: Negative for discharge and visual disturbance.  Respiratory: Positive for cough. Negative for shortness of breath.   Cardiovascular: Negative for chest pain and palpitations.  Gastrointestinal: Negative for abdominal pain, diarrhea and vomiting.  Musculoskeletal: Negative for arthralgias and myalgias.  Skin: Negative for color change and rash.  Neurological: Negative for tremors, syncope and headaches.  Psychiatric/Behavioral: Negative for confusion and dysphoric mood.     Physical Exam Updated Vital Signs BP 137/72 (BP Location: Right Arm)   Pulse (!) 110   Temp 98.4 F (36.9 C) (Oral)   Resp (!) 21   SpO2 100%   Physical Exam  Constitutional: He is oriented to person, place, and time. He appears well-developed and well-nourished.  HENT:  Head: Normocephalic and atraumatic.  Swollen turbinates, posterior nasal drip, no noted sinus ttp, tm normal bilaterally.    Eyes: Pupils are equal, round, and reactive to light. EOM are normal.  Neck: Normal range of motion. Neck supple. No JVD present.  Cardiovascular: Regular rhythm.  Tachycardia present.  Exam reveals no gallop and no friction rub.   No murmur heard. Pulmonary/Chest: No respiratory distress. He has no wheezes.  Abdominal: He exhibits no distension. There is no rebound and no guarding.  Musculoskeletal: Normal range of motion.  Neurological: He is alert and oriented to person, place, and time.  Skin: No rash noted. No pallor.  Psychiatric: He has a normal mood and affect. His behavior is normal.  Nursing note and vitals reviewed.    ED Treatments / Results  Labs (all labs ordered are listed, but only abnormal results are displayed) Labs Reviewed  CBC WITH DIFFERENTIAL/PLATELET - Abnormal;  Notable for the following:       Result Value   RBC 4.02 (*)    Hemoglobin 12.8 (*)    HCT 37.5 (*)    Lymphs Abs 0.6 (*)    All other components within normal limits  BASIC METABOLIC PANEL - Abnormal; Notable for the following:    Glucose, Bld 124 (*)    Calcium 11.1 (*)    All other components within normal limits  I-STAT CHEM 8, ED - Abnormal; Notable for the following:    Glucose, Bld 122 (*)    Hemoglobin 12.6 (*)    HCT 37.0 (*)    All other components within normal limits  I-STAT TROPONIN, ED  I-STAT CG4 LACTIC ACID, ED    EKG  EKG Interpretation  Date/Time:  Wednesday January 04 2017 17:16:48 EDT Ventricular Rate:  112 PR Interval:    QRS Duration: 120  QT Interval:  319 QTC Calculation: 436 R Axis:   -36 Text Interpretation:  Sinus tachycardia Nonspecific IVCD with LAD Abnormal T, consider ischemia, lateral leads Baseline wander in lead(s) V3 V4 flipped t waves in I and AVL not seen on prior Otherwise no significant change Confirmed by [name] [phone]) on 01/04/2017 5:41:06 PM       Radiology Dg Chest 2 View  Result Date: 01/04/2017 CLINICAL DATA:  Productive cough. EXAM: CHEST  2 VIEW COMPARISON:  None. FINDINGS: The heart size and mediastinal contours are within normal limits. Both lungs are clear. No pneumothorax or pleural effusion is noted. The visualized skeletal structures are unremarkable. IMPRESSION: No active cardiopulmonary disease. Electronically Signed   By: Marijo Conception, M.D.   On: 01/04/2017 17:45    Procedures Procedures (including critical care time)  Medications Ordered in ED Medications  lidocaine (XYLOCAINE) 2 % viscous mouth solution 15 mL (15 mLs Mouth/Throat Given 01/04/17 1741)  predniSONE (DELTASONE) tablet 60 mg (60 mg Oral Given 01/04/17 1740)     Initial Impression / Assessment and Plan / ED Course  I have reviewed the triage vital signs and the nursing notes.  Pertinent labs & imaging results that were available during my care  of the patient were reviewed by me and considered in my medical decision making (see chart for details).     71 yo M with a chief complaints of URI like symptoms. Patient has had a cough for the past 5 days. Subjective fevers and chills at home. He is given a breathing treatment en route due to wheezing. He is tachycardic on arrival. Will obtain labs chest x-ray. ECG with changes, though symptoms completely atypical of acs.  Will obtain trop.   Patient had significant improvement of his symptoms with viscous lidocaine gargle. I suspect most of his symptoms are caused by posterior nasal drip. Will have him follow-up with his family physician.  6:27 PM:  I have discussed the diagnosis/risks/treatment options with the patient and believe the pt to be eligible for discharge home to follow-up with PCP. We also discussed returning to the ED immediately if new or worsening sx occur. We discussed the sx which are most concerning (e.g., sudden worsening pain, fever, inability to tolerate by mouth) that necessitate immediate return. Medications administered to the patient during their visit and any new prescriptions provided to the patient are listed below.  Medications given during this visit Medications  lidocaine (XYLOCAINE) 2 % viscous mouth solution 15 mL (15 mLs Mouth/Throat Given 01/04/17 1741)  predniSONE (DELTASONE) tablet 60 mg (60 mg Oral Given 01/04/17 1740)     The patient appears reasonably screen and/or stabilized for discharge and I doubt any other medical condition or other [facility] requiring further screening, evaluation, or treatment in the ED at this time prior to discharge.    Final Clinical Impressions(s) / ED Diagnoses   Final diagnoses:  Acute nasopharyngitis    New Prescriptions New Prescriptions   PREDNISONE (DELTASONE) 20 MG TABLET    2 tabs po daily x 4 days     [name], DO 01/04/17 1827

## 2017-01-10 ENCOUNTER — Ambulatory Visit
Admission: RE | Admit: 2017-01-10 | Discharge: 2017-01-10 | Disposition: A | Discharge status: Home / Self Care | Payer: Medicare Other | Source: Ambulatory Visit | Attending: Diagnostic Neuroimaging | Admitting: Diagnostic Neuroimaging

## 2017-01-10 DIAGNOSIS — H532 Diplopia: Secondary | ICD-10-CM

## 2017-01-10 DIAGNOSIS — R5383 Other fatigue: Secondary | ICD-10-CM

## 2017-01-10 DIAGNOSIS — R1310 Dysphagia, unspecified: Secondary | ICD-10-CM

## 2017-01-10 DIAGNOSIS — R471 Dysarthria and anarthria: Secondary | ICD-10-CM

## 2017-01-10 MED ORDER — GADOBENATE DIMEGLUMINE 529 MG/ML IV SOLN
20.0000 mL | Freq: Once | INTRAVENOUS | Status: AC | PRN
  Administered 2017-01-10: 20 mL via INTRAVENOUS

## 2017-01-18 ENCOUNTER — Encounter: Payer: Self-pay | Admitting: Diagnostic Neuroimaging

## 2017-01-18 ENCOUNTER — Ambulatory Visit (INDEPENDENT_AMBULATORY_CARE_PROVIDER_SITE_OTHER): Payer: Medicare Other | Admitting: Diagnostic Neuroimaging

## 2017-01-18 VITALS — BP 111/68 | HR 93 | Ht 72.0 in | Wt 269.8 lb

## 2017-01-18 DIAGNOSIS — Z9989 Dependence on other enabling machines and devices: Secondary | ICD-10-CM | POA: Diagnosis not present

## 2017-01-18 DIAGNOSIS — G4733 Obstructive sleep apnea (adult) (pediatric): Secondary | ICD-10-CM | POA: Diagnosis not present

## 2017-01-18 DIAGNOSIS — R531 Weakness: Secondary | ICD-10-CM

## 2017-01-18 DIAGNOSIS — G7000 Myasthenia gravis without (acute) exacerbation: Secondary | ICD-10-CM

## 2017-01-18 MED ORDER — PYRIDOSTIGMINE BROMIDE 60 MG PO TABS: 30.0000 mg | ORAL_TABLET | Freq: Three times a day (TID) | ORAL | 4 refills | Status: DC

## 2017-01-18 NOTE — Patient Instructions (Signed)
MYASTHENIA GRAVIS  - continue pyridostigmine 60mg  twice a day   - start IVIG to treat current generalized weakness symptoms  - advised to complete vaccinations for this season per PCP (except no live vaccines)  - at next visit, will consider to start cellcept for longer term immunosuppression   - chest CT chest to rule out thymoma

## 2017-01-18 NOTE — Progress Notes (Signed)
[facility]  PATIENT: Philip [name] DOB: [birth year]  REFERRING CLINICIAN: Delman Cheadle, J HISTORY FROM: patient and chart review REASON FOR VISIT: follow up    HISTORICAL  CHIEF COMPLAINT:  Chief Complaint  Patient presents with  . Follow-up  . Double vision, dysphagia, and dysarthria    these sx are gone.  Had cortisone inj 2 days ago for back pain, and is helping.      HISTORY OF PRESENT ILLNESS:   UPDATE (01/17/17, VRP): Since last visit, doing well on pyridostigmine. It worked the next day. AchR ab are positive. Some generalized fatigue and weakness are noted and new since last visit. Went to ER for laryngospasm (?upper resp infx) and tx'd with 4 days prednisone and oral lidocaine.    PRIOR HPI (12/21/16): 71 year old male here for evaluation of double vision. Her past to 3 weeks patient has had intermittent double vision.  Symptoms present with both eyes open but improve if he closes one eye or uses an eye patch. He describes objects as being up and to the side from each other.  They slightly overlap.  This fluctuates.  Symptoms worse later in the day.  He also had some swallowing difficulties for the past 2 months without a specific cause to be found. He has some generalized fatigue as well. Patient went to [facility] eye clinic, with no specific ocular pathology found.  Patient was referred here for consideration of possible neuromuscular, neuro inflammatory or neuro vascular cause of symptoms. No unilateral numbness or weakness.  No sudden strokelike symptoms.  No headaches.  No prodromal factors.   REVIEW OF SYSTEMS: Full 14 system review of systems performed and negative with exception of: cough fatigue trouble swallowing diff.   ALLERGIES: No Known Allergies  HOME MEDICATIONS: Outpatient Medications Prior to Visit  Medication Sig Dispense Refill  . amitriptyline (ELAVIL) 25 MG tablet Take 75-100 mg by mouth 4 (four) times daily as needed for sleep.     [name]  aspirin EC 81 MG tablet Take 81 mg by mouth daily.    [name] atorvastatin (LIPITOR) 10 MG tablet Take 10 mg by mouth daily.     . diclofenac (VOLTAREN) 75 MG EC tablet Take 75 mg by mouth daily as needed.     [name] esomeprazole (NEXIUM) 40 MG capsule Take 40 mg by mouth daily at 12 noon.    . etodolac (LODINE) 500 MG tablet Take 500 mg by mouth daily as needed.     . fluticasone furoate-vilanterol (BREO ELLIPTA) 200-25 MCG/INH AEPB Inhale 1 puff into the lungs daily as needed.     [name] lisinopril-hydrochlorothiazide (PRINZIDE,ZESTORETIC) 20-25 MG per tablet Take 1 tablet by mouth daily.     . Multiple Vitamin (MULTIVITAMIN) tablet Take 1 tablet by mouth daily.    . predniSONE (DELTASONE) 20 MG tablet 2 tabs po daily x 4 days 8 tablet 0  . pyridostigmine (MESTINON) 60 MG tablet Take 0.5-1 tablets (30-60 mg total) by mouth 3 (three) times daily. 60 tablet 3  . traMADol (ULTRAM) 50 MG tablet Take 50 mg by mouth every 6 (six) hours as needed for pain.     No facility-administered medications prior to visit.     PAST MEDICAL HISTORY: Past Medical History:  Diagnosis Date  . Abdominal hernia   . Asthma   . Barrett's esophagus   . Cough    over 2 weeks  . Fluid retention in legs   . H/O hiatal hernia   . SOB (  shortness of breath)     PAST SURGICAL HISTORY: Past Surgical History:  Procedure Laterality Date  . BALLOON DILATION N/A 10/19/2016   Procedure: BALLOON DILATION;  Surgeon: Ladene Artist, MD;  Location: [facility];  Service: Endoscopy;  Laterality: N/A;  . ESOPHAGOGASTRODUODENOSCOPY N/A 10/19/2016   Procedure: ESOPHAGOGASTRODUODENOSCOPY (EGD);  Surgeon: Ladene Artist, MD;  Location: [facility];  Service: Endoscopy;  Laterality: N/A;  . LUMBAR LAMINECTOMY     1980's    FAMILY HISTORY: Family History  Problem Relation Age of Onset  . Heart disease Mother   . Lung cancer Father   . Colon cancer Neg Hx     SOCIAL HISTORY:  Social History   Social History  . Marital status:  Single    Spouse name: N/A  . Number of children: 0  . Years of education: 12   Occupational History  .      retired   Social History Main Topics  . Smoking status: Former Smoker    Quit date: 12/21/1976  . Smokeless tobacco: Never Used  . Alcohol use 1.2 - 2.4 oz/week    2 - 4 Standard drinks or equivalent per week     Comment: GLASS WINE A NIGHT, 12/21/16 1 oz /wk  . Drug use: No  . Sexual activity: Not on file   Other Topics Concern  . Not on file   Social History Narrative   Lives alone   Caffeine- coffee, tea, sodas 1/2 gallon total daily     PHYSICAL EXAM  GENERAL EXAM/CONSTITUTIONAL: Vitals:  Vitals:   01/18/17 1402  BP: 111/68  Pulse: 93  Weight: 269 lb 12.8 oz (122.4 kg)  Height: 6' (1.829 m)   Body mass index is 36.59 kg/m.  Visual Acuity Screening   Right eye Left eye Both eyes  Without correction:     With correction: 20/30 20/50     Patient is in no distress; well developed, nourished and groomed; neck is supple  CARDIOVASCULAR:  Examination of carotid arteries is normal; no carotid bruits  Regular rate and rhythm, no murmurs  Examination of peripheral vascular system by observation and palpation is normal  EYES:  Ophthalmoscopic exam of optic discs and posterior segments is normal; no papilledema or hemorrhages  MUSCULOSKELETAL:  Gait, strength, tone, movements noted in Neurologic exam below  NEUROLOGIC: MENTAL STATUS:  No flowsheet data found.  awake, alert, oriented to person, place and time  recent and remote memory intact  normal attention and concentration  language fluent, comprehension intact, naming intact,   fund of knowledge appropriate  CRANIAL NERVE:   2nd - no papilledema on fundoscopic exam  2nd, 3rd, 4th, 6th - pupils equal and reactive to light, visual fields full to confrontation, extraocular muscles intact, no nystagmus; NO PTOSIS; NO DOUBLE VISION; ABLE TO COUNT UP TO 40 IN 1 BREATH  5th - facial  sensation symmetric  7th - facial strength symmetric  8th - hearing intact  9th - palate elevates symmetrically, uvula midline  11th - shoulder shrug symmetric  12th - tongue protrusion midline  HOARSE VOICE  MOTOR:   normal bulk and tone, full strength in the BUE, BLE; EXCEPT RIGHT DELTOID 3/5  SENSORY:   normal and symmetric to light touch, temperature  ABSENT VIBRATION IN TOES  COORDINATION:   finger-nose-finger, fine finger movements SLOW  REFLEXES:   deep tendon reflexes TRACE and symmetric; ABSENT AT ANKLES  GAIT/STATION:   ANTALGIC GAIT    DIAGNOSTIC DATA (LABS, IMAGING,  TESTING) - I reviewed patient records, labs, notes, testing and imaging myself where available.  Lab Results  Component Value Date   WBC 9.0 01/04/2017   HGB 12.6 (L) 01/04/2017   HCT 37.0 (L) 01/04/2017   MCV 93.3 01/04/2017   PLT 178 01/04/2017      Component Value Date/Time   NA 138 01/04/2017 1742   K 3.9 01/04/2017 1742   CL 104 01/04/2017 1742   CO2 25 01/04/2017 1727   GLUCOSE 122 (H) 01/04/2017 1742   BUN 20 01/04/2017 1742   CREATININE 0.80 01/04/2017 1742   CALCIUM 11.1 (H) 01/04/2017 1727   PROT 6.2 05/09/2007 0937   ALBUMIN 3.7 05/09/2007 0937   AST 23 05/09/2007 0937   ALT 30 05/09/2007 0937   ALKPHOS 61 05/09/2007 0937   BILITOT 1.0 05/09/2007 0937   GFRNONAA >60 01/04/2017 1727   GFRAA >60 01/04/2017 1727   No results found for: CHOL, HDL, LDLCALC, LDLDIRECT, TRIG, CHOLHDL Lab Results  Component Value Date   HGBA1C 5.4 12/21/2016   No results found for: [id number] Lab Results  Component Value Date   TSH 2.040 12/21/2016    AChR Binding Ab, Serum  Date Value Ref Range Status  12/21/2016 65.60 (H) 0.00 - 0.24 nmol/L Final    Comment:    **Results verified by repeat testing**                                Negative:   0.00 - 0.24                                Borderline: 0.25 - 0.40                                Positive:        > 0.40      01/10/17 MRI brain  - Abnormal MRI scan of the brain showing mild changes of chronic microvascular ischemia and generalized cerebral atrophy. Incidental changes of chronic paranasal sinusitis are noted. 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     ASSESSMENT AND PLAN  71 y.o. year old male here with fatigable, fluctuating, intermittent double vision for past 2-3 weeks, with approximately 2 months of swallowing difficulty.  Also with generalized weakness intermittently.  Has positive AchR antibodies and good response to pyridostigmine.    Dx: myasthenia gravis  1. Myasthenia gravis ([place])   2. Generalized weakness   3. OSA on CPAP       PLAN:  MYASTHENIA GRAVIS  - continue pyridostigmine 60mg  twice a day   - start IVIG (0.4g/kg daily x 5 days) to treat current MG flare (generalized weakness symptoms)  - advised patient to complete vaccinations for this season per PCP (except no live vaccines)  - at next visit, will consider to start cellcept 1000mg  twice a day for longer term immunosuppression (will avoid prednisone due to potential side effects of weight gait, HTN, hyperglycemia)  - chest CT chest to rule out thymoma   Orders Placed This Encounter  Procedures  . CT CHEST W CONTRAST   Meds ordered this encounter  Medications  . pyridostigmine (MESTINON) 60 MG tablet    Sig: Take 0.5-1 tablets (30-60 mg total) by mouth 3 (three) times daily.    Dispense:  180  tablet    Refill:  4   Return in about 1 month (around 02/17/2017).  I reviewed images, labs, notes, records myself. I summarized findings and reviewed with patient, for this high risk condition (myasthenia gravis flare) requiring high complexity decision making.     Penni Bombard, MD 3/73/5789, 7:84 PM Certified in Neurology, Neurophysiology and Neuroimaging  [facility] [address] [place], [place] [address] [phone]

## 2017-01-19 ENCOUNTER — Encounter (HOSPITAL_BASED_OUTPATIENT_CLINIC_OR_DEPARTMENT_OTHER): Payer: Self-pay

## 2017-01-19 ENCOUNTER — Ambulatory Visit (HOSPITAL_BASED_OUTPATIENT_CLINIC_OR_DEPARTMENT_OTHER)
Admission: RE | Admit: 2017-01-19 | Discharge: 2017-01-19 | Disposition: A | Discharge status: Home / Self Care | Payer: Medicare Other | Source: Ambulatory Visit | Attending: Diagnostic Neuroimaging | Admitting: Diagnostic Neuroimaging

## 2017-01-19 DIAGNOSIS — G7000 Myasthenia gravis without (acute) exacerbation: Secondary | ICD-10-CM | POA: Insufficient documentation

## 2017-01-19 DIAGNOSIS — I700 Atherosclerosis of aorta: Secondary | ICD-10-CM | POA: Diagnosis not present

## 2017-01-19 DIAGNOSIS — R911 Solitary pulmonary nodule: Secondary | ICD-10-CM | POA: Diagnosis not present

## 2017-01-19 DIAGNOSIS — M47814 Spondylosis without myelopathy or radiculopathy, thoracic region: Secondary | ICD-10-CM | POA: Diagnosis not present

## 2017-01-19 DIAGNOSIS — R531 Weakness: Secondary | ICD-10-CM | POA: Diagnosis present

## 2017-01-19 MED ORDER — IOPAMIDOL (ISOVUE-300) INJECTION 61%
80.0000 mL | Freq: Once | INTRAVENOUS | Status: AC | PRN
  Administered 2017-01-19: 100 mL via INTRAVENOUS

## 2017-01-23 ENCOUNTER — Telehealth: Payer: Self-pay | Admitting: Diagnostic Neuroimaging

## 2017-01-23 NOTE — Telephone Encounter (Signed)
Patient calling for CT results. 

## 2017-01-25 ENCOUNTER — Telehealth: Payer: Self-pay | Admitting: *Deleted

## 2017-01-25 NOTE — Telephone Encounter (Signed)
-----   Message from Penni Bombard, MD sent at 01/24/2017  2:55 PM EDT ----- No evidence of a thymoma. Incidental lung nodule; follow up with PCP for possible repeat scan in 12 months. Please call patient. Continue current plan. -VRP

## 2017-01-25 NOTE — Telephone Encounter (Signed)
Spoke to pt and relayed that the CT Chest did not show evidence of thymoma, but did have incidental finding of lung nodule.  Instructed to f/u with pcp, for possible repeat scan in a yr.  He verbalized understanding.  Continue with current plan.

## 2017-01-25 NOTE — Telephone Encounter (Signed)
Spoke to pt and relayed that the CT Chest did not show evidence of thymoma, but did have incidental finding of lung nodule.  Instructed to f/u with pcp, for possible repeat scan in a yr.  He verbalized understanding.  Continue with current plan.  Faxed to Dr. Claiborne Billings, and [facility].

## 2017-03-13 ENCOUNTER — Ambulatory Visit (INDEPENDENT_AMBULATORY_CARE_PROVIDER_SITE_OTHER): Payer: Medicare Other | Admitting: Diagnostic Neuroimaging

## 2017-03-13 ENCOUNTER — Encounter: Payer: Self-pay | Admitting: Diagnostic Neuroimaging

## 2017-03-13 VITALS — BP 130/73 | HR 83 | Ht 72.0 in | Wt 281.6 lb

## 2017-03-13 DIAGNOSIS — R5383 Other fatigue: Secondary | ICD-10-CM

## 2017-03-13 DIAGNOSIS — G7000 Myasthenia gravis without (acute) exacerbation: Secondary | ICD-10-CM | POA: Diagnosis not present

## 2017-03-13 DIAGNOSIS — Z9989 Dependence on other enabling machines and devices: Secondary | ICD-10-CM | POA: Diagnosis not present

## 2017-03-13 DIAGNOSIS — G4733 Obstructive sleep apnea (adult) (pediatric): Secondary | ICD-10-CM

## 2017-03-13 MED ORDER — MYCOPHENOLATE MOFETIL 500 MG PO TABS: 1000.0000 mg | ORAL_TABLET | Freq: Two times a day (BID) | ORAL | 12 refills | Status: DC

## 2017-03-13 NOTE — Progress Notes (Signed)
[facility]  PATIENT: Philip [name] DOB: 01/10/1946  REFERRING CLINICIAN: Delman Cheadle, J HISTORY FROM: patient and chart review REASON FOR VISIT: follow up    HISTORICAL  CHIEF COMPLAINT:  Chief Complaint  Patient presents with  . Follow-up  . MG    doing better.    HISTORY OF PRESENT ILLNESS:   UPDATE (03/13/17, VRP): Since last visit, doing well. IVIG helped quite a bit. Tolerating pyridostigmine. No alleviating or aggravating factors. No generalized weakness but notes some non-specific "tired" feeling. No double vision or drooping eyelids.   UPDATE (01/17/17, VRP): Since last visit, doing well on pyridostigmine. It worked the next day. AchR ab are positive. Some generalized fatigue and weakness are noted and new since last visit. Went to ER for laryngospasm (?upper resp infx) and tx'd with 4 days prednisone and oral lidocaine.    PRIOR HPI (12/21/16): 72 year old male here for evaluation of double vision. Her past to 3 weeks patient has had intermittent double vision.  Symptoms present with both eyes open but improve if he closes one eye or uses an eye patch. He describes objects as being up and to the side from each other.  They slightly overlap.  This fluctuates.  Symptoms worse later in the day.  He also had some swallowing difficulties for the past 2 months without a specific cause to be found. He has some generalized fatigue as well. Patient went to [facility] eye clinic, with no specific ocular pathology found.  Patient was referred here for consideration of possible neuromuscular, neuro inflammatory or neuro vascular cause of symptoms. No unilateral numbness or weakness.  No sudden strokelike symptoms.  No headaches.  No prodromal factors.   REVIEW OF SYSTEMS: Full 14 system review of systems performed and negative with exception of: cough fatigue trouble swallowing diff.   ALLERGIES: No Known Allergies  HOME MEDICATIONS: Outpatient Medications Prior to Visit    Medication Sig Dispense Refill  . amitriptyline (ELAVIL) 25 MG tablet Take 75-100 mg by mouth 4 (four) times daily as needed for sleep.     [name] aspirin EC 81 MG tablet Take 81 mg by mouth daily.    [name] atorvastatin (LIPITOR) 10 MG tablet Take 10 mg by mouth daily.     . diclofenac (VOLTAREN) 75 MG EC tablet Take 75 mg by mouth daily as needed.     [name] esomeprazole (NEXIUM) 40 MG capsule Take 40 mg by mouth daily at 12 noon.    . etodolac (LODINE) 500 MG tablet Take 500 mg by mouth daily as needed.     . fluticasone furoate-vilanterol (BREO ELLIPTA) 200-25 MCG/INH AEPB Inhale 1 puff into the lungs daily as needed.     [name] lisinopril-hydrochlorothiazide (PRINZIDE,ZESTORETIC) 20-25 MG per tablet Take 1 tablet by mouth daily.     . Multiple Vitamin (MULTIVITAMIN) tablet Take 1 tablet by mouth daily.    . predniSONE (DELTASONE) 20 MG tablet 2 tabs po daily x 4 days (Patient taking differently: Take 20 mg daily with breakfast by mouth. 2 tabs po daily x 4 days) 8 tablet 0  . pyridostigmine (MESTINON) 60 MG tablet Take 0.5-1 tablets (30-60 mg total) by mouth 3 (three) times daily. 180 tablet 4  . traMADol (ULTRAM) 50 MG tablet Take 50 mg by mouth every 6 (six) hours as needed for pain.     No facility-administered medications prior to visit.     PAST MEDICAL HISTORY: Past Medical History:  Diagnosis Date  . Abdominal hernia   .  Asthma   . Barrett's esophagus   . Cough    over 2 weeks  . Fluid retention in legs   . H/O hiatal hernia   . SOB (shortness of breath)     PAST SURGICAL HISTORY: Past Surgical History:  Procedure Laterality Date  . LUMBAR LAMINECTOMY     1980's    FAMILY HISTORY: Family History  Problem Relation Age of Onset  . Heart disease Mother   . Lung cancer Father   . Colon cancer Neg Hx     SOCIAL HISTORY:  Social History   Socioeconomic History  . Marital status: Single    Spouse name: Not on file  . Number of children: 0  . Years of education: 27  . Highest  education level: Not on file  Social Needs  . Financial resource strain: Not on file  . Food insecurity - worry: Not on file  . Food insecurity - inability: Not on file  . Transportation needs - medical: Not on file  . Transportation needs - non-medical: Not on file  Occupational History    Comment: retired  Tobacco Use  . Smoking status: Former Smoker    Last attempt to quit: 12/21/1976    Years since quitting: 40.2  . Smokeless tobacco: Never Used  Substance and Sexual Activity  . Alcohol use: Yes    Alcohol/week: 1.2 - 2.4 oz    Types: 2 - 4 Standard drinks or equivalent per week    Comment: GLASS WINE A NIGHT, 12/21/16 1 oz /wk  . Drug use: No  . Sexual activity: Not on file  Other Topics Concern  . Not on file  Social History Narrative   Lives alone   Caffeine- coffee, tea, sodas 1/2 gallon total daily     PHYSICAL EXAM  GENERAL EXAM/CONSTITUTIONAL: Vitals:  Vitals:   03/13/17 1511  BP: 130/73  Pulse: 83  Weight: 281 lb 9.6 oz (127.7 kg)  Height: 6' (1.829 m)   Body mass index is 38.19 kg/m. No exam data present  Patient is in no distress; well developed, nourished and groomed; neck is supple  CARDIOVASCULAR:  Examination of carotid arteries is normal; no carotid bruits  IRREGULAR rate and rhythm, no murmurs  Examination of peripheral vascular system by observation and palpation is normal  EYES:  Ophthalmoscopic exam of optic discs and posterior segments is normal; no papilledema or hemorrhages  MUSCULOSKELETAL:  Gait, strength, tone, movements noted in Neurologic exam below  NEUROLOGIC: MENTAL STATUS:  No flowsheet data found.  awake, alert, oriented to person, place and time  recent and remote memory intact  normal attention and concentration  language fluent, comprehension intact, naming intact,   fund of knowledge appropriate  CRANIAL NERVE:   2nd - no papilledema on fundoscopic exam  2nd, 3rd, 4th, 6th - pupils equal and reactive  to light, visual fields full to confrontation, extraocular muscles intact, no nystagmus; NO PTOSIS; NO DOUBLE VISION; ABLE TO COUNT UP TO 36 IN 1 BREATH  5th - facial sensation symmetric  7th - facial strength symmetric  8th - hearing intact  9th - palate elevates symmetrically, uvula midline  11th - shoulder shrug symmetric  12th - tongue protrusion midline  HOARSE VOICE  MOTOR:   normal bulk and tone, full strength in the BUE, BLE; EXCEPT RIGHT DELTOID 4/5  SENSORY:   normal and symmetric to light touch, temperature  ABSENT VIBRATION IN TOES  COORDINATION:   finger-nose-finger, fine finger movements SLOW  REFLEXES:  deep tendon reflexes TRACE and symmetric; ABSENT AT ANKLES  GAIT/STATION:   SMOOTH GAIT    DIAGNOSTIC DATA (LABS, IMAGING, TESTING) - I reviewed patient records, labs, notes, testing and imaging myself where available.  Lab Results  Component Value Date   WBC 9.0 01/04/2017   HGB 12.6 (L) 01/04/2017   HCT 37.0 (L) 01/04/2017   MCV 93.3 01/04/2017   PLT 178 01/04/2017      Component Value Date/Time   NA 138 01/04/2017 1742   K 3.9 01/04/2017 1742   CL 104 01/04/2017 1742   CO2 25 01/04/2017 1727   GLUCOSE 122 (H) 01/04/2017 1742   BUN 20 01/04/2017 1742   CREATININE 0.80 01/04/2017 1742   CALCIUM 11.1 (H) 01/04/2017 1727   PROT 6.2 05/09/2007 0937   ALBUMIN 3.7 05/09/2007 0937   AST 23 05/09/2007 0937   ALT 30 05/09/2007 0937   ALKPHOS 61 05/09/2007 0937   BILITOT 1.0 05/09/2007 0937   GFRNONAA >60 01/04/2017 1727   GFRAA >60 01/04/2017 1727   No results found for: CHOL, HDL, LDLCALC, LDLDIRECT, TRIG, CHOLHDL Lab Results  Component Value Date   HGBA1C 5.4 12/21/2016   No results found for: [id number] Lab Results  Component Value Date   TSH 2.040 12/21/2016    AChR Binding Ab, Serum  Date Value Ref Range Status  12/21/2016 65.60 (H) 0.00 - 0.24 nmol/L Final    Comment:    **Results verified by repeat testing**                                 Negative:   0.00 - 0.24                                Borderline: 0.25 - 0.40                                Positive:        > 0.40     01/10/17 MRI brain  - Abnormal MRI scan of the brain showing mild changes of chronic microvascular ischemia and generalized cerebral atrophy. Incidental changes of chronic paranasal sinusitis are noted. 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     ASSESSMENT AND PLAN  71 y.o. year old male here with fatigable, fluctuating, intermittent double vision for past 2-3 weeks, with approximately 2 months of swallowing difficulty.  Also with generalized weakness intermittently.  Has positive AchR antibodies and good response to pyridostigmine and IVIG.   Dx: myasthenia gravis  No diagnosis found.    PLAN:  MYASTHENIA GRAVIS  - continue pyridostigmine 60mg  2-3 times a day  - start cellcept 500mg  twice a day for longer term immunosuppression (will avoid prednisone due to potential side effects of weight gait, HTN, hyperglycemia); after 1-2 months increase to 1000mg  twice a day   - repeat CBC, CMP in 3 months  Meds ordered this encounter  Medications  . mycophenolate (CELLCEPT) 500 MG tablet    Sig: Take 2 tablets (1,000 mg total) 2 (two) times daily by mouth.    Dispense:  120 tablet    Refill:  12   Return in about 4 months (around 07/11/2017).    Penni Bombard, MD 67/04/4579, 9:98 PM Certified in Neurology, Neurophysiology and Neuroimaging  [facility] [phone]  [address] [place], [place] [address] [phone]

## 2017-03-13 NOTE — Patient Instructions (Addendum)
-   continue pyridostigmine 60mg  2-3 times a day  - start cellcept 500mg  twice a day; after 1-2 months increase to 1000mg  twice a day   - repeat labs in 3 months

## 2017-03-28 ENCOUNTER — Telehealth: Payer: Self-pay | Admitting: *Deleted

## 2017-03-28 NOTE — Telephone Encounter (Signed)
Received fax from [facility] for  ICD 10 medical necessity for Service of HGB Glycated.  Hanley Seamen [id number] other longterm current drug therapy. Faxed back signed by Dr. Leta Baptist [phone] (with confirmation).

## 2017-04-03 ENCOUNTER — Emergency Department (HOSPITAL_BASED_OUTPATIENT_CLINIC_OR_DEPARTMENT_OTHER)
Admission: EM | Admit: 2017-04-03 | Discharge: 2017-04-03 | Disposition: A | Discharge status: Home / Self Care | Payer: Medicare Other | Attending: Emergency Medicine | Admitting: Emergency Medicine

## 2017-04-03 ENCOUNTER — Other Ambulatory Visit: Payer: Self-pay

## 2017-04-03 ENCOUNTER — Emergency Department (HOSPITAL_BASED_OUTPATIENT_CLINIC_OR_DEPARTMENT_OTHER): Payer: Medicare Other

## 2017-04-03 ENCOUNTER — Encounter (HOSPITAL_BASED_OUTPATIENT_CLINIC_OR_DEPARTMENT_OTHER): Payer: Self-pay | Admitting: Emergency Medicine

## 2017-04-03 DIAGNOSIS — Z87891 Personal history of nicotine dependence: Secondary | ICD-10-CM | POA: Insufficient documentation

## 2017-04-03 DIAGNOSIS — J45909 Unspecified asthma, uncomplicated: Secondary | ICD-10-CM | POA: Diagnosis not present

## 2017-04-03 DIAGNOSIS — W19XXXA Unspecified fall, initial encounter: Secondary | ICD-10-CM

## 2017-04-03 DIAGNOSIS — Z23 Encounter for immunization: Secondary | ICD-10-CM | POA: Diagnosis not present

## 2017-04-03 DIAGNOSIS — W108XXA Fall (on) (from) other stairs and steps, initial encounter: Secondary | ICD-10-CM | POA: Diagnosis not present

## 2017-04-03 DIAGNOSIS — Y9389 Activity, other specified: Secondary | ICD-10-CM | POA: Diagnosis not present

## 2017-04-03 DIAGNOSIS — Y929 Unspecified place or not applicable: Secondary | ICD-10-CM | POA: Diagnosis not present

## 2017-04-03 DIAGNOSIS — S0101XA Laceration without foreign body of scalp, initial encounter: Secondary | ICD-10-CM

## 2017-04-03 DIAGNOSIS — Z79899 Other long term (current) drug therapy: Secondary | ICD-10-CM | POA: Insufficient documentation

## 2017-04-03 DIAGNOSIS — Z7982 Long term (current) use of aspirin: Secondary | ICD-10-CM | POA: Diagnosis not present

## 2017-04-03 DIAGNOSIS — Y998 Other external cause status: Secondary | ICD-10-CM | POA: Diagnosis not present

## 2017-04-03 HISTORY — DX: Essential (primary) hypertension: I10

## 2017-04-03 LAB — CBC WITH DIFFERENTIAL/PLATELET
BASOS ABS: 0.1 10*3/uL (ref 0.0–0.1)
BASOS PCT: 1 %
EOS PCT: 4 %
Eosinophils Absolute: 0.3 10*3/uL (ref 0.0–0.7)
HCT: 38.3 % — ABNORMAL LOW (ref 39.0–52.0)
Hemoglobin: 12.9 g/dL — ABNORMAL LOW (ref 13.0–17.0)
LYMPHS PCT: 20 %
Lymphs Abs: 1.5 10*3/uL (ref 0.7–4.0)
MCH: 32.7 pg (ref 26.0–34.0)
MCHC: 33.7 g/dL (ref 30.0–36.0)
MCV: 97.2 fL (ref 78.0–100.0)
MONO ABS: 0.7 10*3/uL (ref 0.1–1.0)
Monocytes Relative: 9 %
NEUTROS ABS: 4.9 10*3/uL (ref 1.7–7.7)
Neutrophils Relative %: 66 %
PLATELETS: 200 10*3/uL (ref 150–400)
RBC: 3.94 MIL/uL — AB (ref 4.22–5.81)
RDW: 13.5 % (ref 11.5–15.5)
WBC: 7.4 10*3/uL (ref 4.0–10.5)

## 2017-04-03 LAB — BASIC METABOLIC PANEL
ANION GAP: 8 (ref 5–15)
BUN: 22 mg/dL — ABNORMAL HIGH (ref 6–20)
CALCIUM: 10.6 mg/dL — AB (ref 8.9–10.3)
CO2: 22 mmol/L (ref 22–32)
Chloride: 104 mmol/L (ref 101–111)
Creatinine, Ser: 0.81 mg/dL (ref 0.61–1.24)
Glucose, Bld: 100 mg/dL — ABNORMAL HIGH (ref 65–99)
POTASSIUM: 4 mmol/L (ref 3.5–5.1)
SODIUM: 134 mmol/L — AB (ref 135–145)

## 2017-04-03 MED ORDER — TETANUS-DIPHTH-ACELL PERTUSSIS 5-2.5-18.5 LF-MCG/0.5 IM SUSP
0.5000 mL | Freq: Once | INTRAMUSCULAR | Status: AC
  Administered 2017-04-03: 0.5 mL via INTRAMUSCULAR
  Filled 2017-04-03: qty 0.5

## 2017-04-03 NOTE — ED Notes (Signed)
Patient transported to CT 

## 2017-04-03 NOTE — ED Provider Notes (Signed)
[facility] Provider Note   CSN: [id number] Arrival date & time: 04/03/17  1921     History   Chief Complaint Chief Complaint  Patient presents with  . Fall    HPI Philip [name] is a 71 y.o. male presents to ED for evaluation of laceration to occipital head sustained today after fall. States he was trying to lift a heavy object up the stairs when he lost his balance and fell forward down the steps, thinks he turned as he was going down causing him to struck the back of his head. He denies preceding CP, SOB, palpitations, dizziness. No anticoagulants. Denies LOC, vision changes, nausea, vomiting, numbness/tingling to extremities. Has chronic back pain, unchanged.   HPI  Past Medical History:  Diagnosis Date  . Abdominal hernia   . Asthma   . Barrett's esophagus   . Cough    over 2 weeks  . Fluid retention in legs   . H/O hiatal hernia   . Hypertension   . SOB (shortness of breath)     Patient Active Problem List   Diagnosis Date Noted  . Myasthenia gravis ([place]) 01/18/2017  . OSA on CPAP 01/18/2017  . Esophageal stricture 10/27/2016  . Dysphagia   . Abnormal esophagram   . ESOPHAGEAL REFLUX 05/08/2009  . BARRETTS ESOPHAGUS 05/08/2009  . DIVERTICULOSIS OF COLON 05/08/2009  . ABDOMINAL BLOATING 05/08/2009    Past Surgical History:  Procedure Laterality Date  . BALLOON DILATION N/A 10/19/2016   Procedure: BALLOON DILATION;  Surgeon: Ladene Artist, MD;  Location: [facility];  Service: Endoscopy;  Laterality: N/A;  . ESOPHAGOGASTRODUODENOSCOPY N/A 10/19/2016   Procedure: ESOPHAGOGASTRODUODENOSCOPY (EGD);  Surgeon: Ladene Artist, MD;  Location: [facility];  Service: Endoscopy;  Laterality: N/A;  . LUMBAR LAMINECTOMY     1980's       Home Medications    Prior to Admission medications   Medication Sig Start Date End Date Taking? Authorizing Provider  amitriptyline (ELAVIL) 25 MG tablet Take 75-100 mg by mouth 4 (four)  times daily as needed for sleep.  04/01/12   [provider]  aspirin EC 81 MG tablet Take 81 mg by mouth daily.    [provider]  atorvastatin (LIPITOR) 10 MG tablet Take 10 mg by mouth daily.  03/22/16   [provider]  diclofenac (VOLTAREN) 75 MG EC tablet Take 75 mg by mouth daily as needed.  09/22/16   [provider]  esomeprazole (NEXIUM) 40 MG capsule Take 40 mg by mouth daily at 12 noon.    [provider]  etodolac (LODINE) 500 MG tablet Take 500 mg by mouth daily as needed.  11/07/13   [provider]  fluticasone furoate-vilanterol (BREO ELLIPTA) 200-25 MCG/INH AEPB Inhale 1 puff into the lungs daily as needed.  06/26/15   [provider]  lisinopril-hydrochlorothiazide (PRINZIDE,ZESTORETIC) 20-25 MG per tablet Take 1 tablet by mouth daily.  03/23/12   [provider]  Multiple Vitamin (MULTIVITAMIN) tablet Take 1 tablet by mouth daily.    [provider]  mycophenolate (CELLCEPT) 500 MG tablet Take 2 tablets (1,000 mg total) 2 (two) times daily by mouth. 03/13/17   Penumalli, Earlean Polka, MD  predniSONE (DELTASONE) 20 MG tablet 2 tabs po daily x 4 days Patient taking differently: Take 20 mg daily with breakfast by mouth. 2 tabs po daily x 4 days 01/04/17   [name], DO  pyridostigmine (MESTINON) 60 MG tablet Take 0.5-1 tablets (30-60  mg total) by mouth 3 (three) times daily. 01/18/17   Penumalli, Earlean Polka, MD  traMADol (ULTRAM) 50 MG tablet Take 50 mg by mouth every 6 (six) hours as needed for pain.    [provider]    Family History Family History  Problem Relation Age of Onset  . Heart disease Mother   . Lung cancer Father   . Colon cancer Neg Hx     Social History Social History   Tobacco Use  . Smoking status: Former Smoker    Last attempt to quit: 12/21/1976    Years since quitting: 40.3  . Smokeless tobacco: Never Used  Substance Use Topics  . Alcohol use: Yes    Alcohol/week: 1.2  - 2.4 oz    Types: 2 - 4 Standard drinks or equivalent per week    Comment: GLASS WINE A NIGHT, 12/21/16 1 oz /wk  . Drug use: No     Allergies   Patient has no known allergies.   Review of Systems Review of Systems  Skin: Positive for wound.  All other systems reviewed and are negative.    Physical Exam Updated Vital Signs BP (!) 117/59   Pulse 83   Temp 98.3 F (36.8 C) (Oral)   Resp 20   Ht 6' (1.829 m)   Wt 127.5 kg (281 lb)   SpO2 98%   BMI 38.11 kg/m   Physical Exam  Constitutional: He is oriented to person, place, and time. He appears well-developed and well-nourished. No distress.  NAD.  HENT:  Head: Normocephalic and atraumatic.  Right Ear: External ear normal.  Left Ear: External ear normal.  Nose: Nose normal.  Mouth/Throat: Oropharynx is clear and moist. No oropharyngeal exudate.  4 cm laceration to occipital scalp, actively bleeding. Mild hematoma with appropriate tenderness to this area. No facial, nasal or jaw bone tenderness. Tympanic membranes normal bilaterally.  Eyes: Conjunctivae and EOM are normal. Pupils are equal, round, and reactive to light. No scleral icterus.  Neck: Normal range of motion. Neck supple.  No midline C-spine tenderness. Full PROM of neck without pain. Trachea midline   Cardiovascular: Normal rate, regular rhythm, normal heart sounds and intact distal pulses.  No murmur heard. Pulmonary/Chest: Effort normal and breath sounds normal. He has no wheezes.  Abdominal: Soft. Bowel sounds are normal. There is no tenderness.  Musculoskeletal: Normal range of motion. He exhibits no deformity.  No CTL midline spine tenderness PROM of upper and lower extremities without pain  Neurological: He is alert and oriented to person, place, and time.  Skin: Skin is warm and dry. Capillary refill takes less than 2 seconds.  Scalp laceration as above, no galea visualized.  Psychiatric: He has a normal mood and affect. His behavior is normal.  Judgment and thought content normal.  Nursing note and vitals reviewed.    ED Treatments / Results  Labs (all labs ordered are listed, but only abnormal results are displayed) Labs Reviewed  CBC WITH DIFFERENTIAL/PLATELET - Abnormal; Notable for the following components:      Result Value   RBC 3.94 (*)    Hemoglobin 12.9 (*)    HCT 38.3 (*)    All other components within normal limits  BASIC METABOLIC PANEL - Abnormal; Notable for the following components:   Sodium 134 (*)    Glucose, Bld 100 (*)    BUN 22 (*)    Calcium 10.6 (*)    All other components within normal limits    EKG  EKG Interpretation None       Radiology Ct Head Wo Contrast  Result Date: 04/03/2017 CLINICAL DATA:  Patient fell down some steps with laceration to back of head. Head and neck pain. EXAM: CT HEAD WITHOUT CONTRAST CT CERVICAL SPINE WITHOUT CONTRAST TECHNIQUE: Multidetector CT imaging of the head and cervical spine was performed following the standard protocol without intravenous contrast. Multiplanar CT image reconstructions of the cervical spine were also generated. COMPARISON:  None. FINDINGS: CT HEAD FINDINGS Brain: There is no evidence for acute hemorrhage, hydrocephalus, mass lesion, or abnormal extra-axial fluid collection. No definite CT evidence for acute infarction. Diffuse loss of parenchymal volume is consistent with atrophy. Patchy low attenuation in the deep hemispheric and periventricular white matter is nonspecific, but likely reflects chronic microvascular ischemic demyelination. Vascular: No hyperdense vessel or unexpected calcification. Skull: No evidence for fracture. No worrisome lytic or sclerotic lesion. Sinuses/Orbits: The visualized paranasal sinuses and mastoid air cells are clear. Visualized portions of the globes and intraorbital fat are unremarkable. Other: Left high parietal scalp contusion/ laceration evident. CT CERVICAL SPINE FINDINGS Alignment: Straightening of normal  cervical lordosis. Skull base and vertebrae: No acute fracture. No primary bone lesion or focal pathologic process. Soft tissues and spinal canal: No prevertebral fluid or swelling. No visible canal hematoma. Disc levels: Mild diffuse loss of intervertebral disc height. Advanced facet osteoarthritis is seen on the left from C2-3 down to C4-5. Upper chest: Unremarkable. Other: None. IMPRESSION: 1. No acute intracranial abnormality. 2. Atrophy with chronic small vessel white matter ischemic disease. 3. Degenerative changes in the cervical spine without fracture. 4. Loss of cervical lordosis. This can be related to patient positioning, muscle spasm or soft tissue injury. Electronically Signed   By: Misty Stanley M.D.   On: 04/03/2017 20:37   Ct Cervical Spine Wo Contrast  Result Date: 04/03/2017 CLINICAL DATA:  Patient fell down some steps with laceration to back of head. Head and neck pain. EXAM: CT HEAD WITHOUT CONTRAST CT CERVICAL SPINE WITHOUT CONTRAST TECHNIQUE: Multidetector CT imaging of the head and cervical spine was performed following the standard protocol without intravenous contrast. Multiplanar CT image reconstructions of the cervical spine were also generated. COMPARISON:  None. FINDINGS: CT HEAD FINDINGS Brain: There is no evidence for acute hemorrhage, hydrocephalus, mass lesion, or abnormal extra-axial fluid collection. No definite CT evidence for acute infarction. Diffuse loss of parenchymal volume is consistent with atrophy. Patchy low attenuation in the deep hemispheric and periventricular white matter is nonspecific, but likely reflects chronic microvascular ischemic demyelination. Vascular: No hyperdense vessel or unexpected calcification. Skull: No evidence for fracture. No worrisome lytic or sclerotic lesion. Sinuses/Orbits: The visualized paranasal sinuses and mastoid air cells are clear. Visualized portions of the globes and intraorbital fat are unremarkable. Other: Left high parietal  scalp contusion/ laceration evident. CT CERVICAL SPINE FINDINGS Alignment: Straightening of normal cervical lordosis. Skull base and vertebrae: No acute fracture. No primary bone lesion or focal pathologic process. Soft tissues and spinal canal: No prevertebral fluid or swelling. No visible canal hematoma. Disc levels: Mild diffuse loss of intervertebral disc height. Advanced facet osteoarthritis is seen on the left from C2-3 down to C4-5. Upper chest: Unremarkable. Other: None. IMPRESSION: 1. No acute intracranial abnormality. 2. Atrophy with chronic small vessel white matter ischemic disease. 3. Degenerative changes in the cervical spine without fracture. 4. Loss of cervical lordosis. This can be related to patient positioning, muscle spasm or soft tissue injury. Electronically Signed   By: Verda Cumins.D.  On: 04/03/2017 20:37    Procedures .Marland KitchenLaceration Repair Date/Time: 04/03/2017 9:44 PM Performed by: Kinnie Feil, PA-C Authorized by: Kinnie Feil, PA-C   Consent:    Consent obtained:  Verbal   Consent given by:  Patient   Risks discussed:  Infection, pain, nerve damage and poor wound healing   Alternatives discussed:  Delayed treatment Anesthesia (see MAR for exact dosages):    Anesthesia method:  Local infiltration   Local anesthetic:  Lidocaine 2% WITH epi Laceration details:    Location:  Scalp   Scalp location:  Occipital   Length (cm):  4 Repair type:    Repair type:  Simple Pre-procedure details:    Preparation:  Patient was prepped and draped in usual sterile fashion Exploration:    Hemostasis achieved with:  Epinephrine and direct pressure   Wound exploration: entire depth of wound probed and visualized   Treatment:    Area cleansed with:  Betadine   Amount of cleaning:  Standard Skin repair:    Repair method:  Staples   Number of staples:  8 Approximation:    Approximation:  Close   Verm[place] border: well-aligned   Post-procedure details:     Dressing:  Open (no dressing)   Patient tolerance of procedure:  Tolerated well, no immediate complications   (including critical care time)  Medications Ordered in ED Medications  Tdap (BOOSTRIX) injection 0.5 mL (0.5 mLs Intramuscular Given 04/03/17 2138)     Initial Impression / Assessment and Plan / ED Course  I have reviewed the triage vital signs and the nursing notes.  Pertinent labs & imaging results that were available during my care of the patient were reviewed by me and considered in my medical decision making (see chart for details).    71 year old male presents with typical scalp laceration sustained after mechanical fall. No preceding cardiac or neurological of symptoms to raise suspicion for occult emergency. Mechanical fall. Repeat evaluation unchanged from initial w/o signs of severe head, c-spine, MSK injury. Neuro intact. I personally ambulated patient. Lab work and CT scans today unremarkable. Will d/c with wound care instructions. Discussed return precautions. Pt shared with supervising physician who agrees with ED tx and plan.  Final Clinical Impressions(s) / ED Diagnoses   Final diagnoses:  Fall, initial encounter  Laceration of scalp, initial encounter    ED Discharge Orders    None       Arlean Hopping 04/03/17 2149    Drenda Freeze, MD 04/03/17 [phone]

## 2017-04-03 NOTE — Discharge Instructions (Addendum)
You CT scan and lab work were normal.  Your wound was repaired with 8 staples. Keep laceration clean. Can wash after 12-24 hours with clean water and soap. Pat dry. Can apply thin layer of antibiotic ointment. Use dressing to prevent dirt and bleeding. Can take tylenol for pain as needed. Follow up with primary care doctor in 7-10 days for staple removal. Monitor for signs of infection including redness, swelling, fevers, yellow drainage.

## 2017-04-03 NOTE — ED Triage Notes (Signed)
Patient states that he fell down 2 -3 steps  - denies any LOC - he is on blood thinners

## 2017-05-04 ENCOUNTER — Encounter: Payer: Self-pay | Admitting: Gastroenterology

## 2017-06-05 ENCOUNTER — Encounter: Payer: Self-pay | Admitting: Diagnostic Neuroimaging

## 2017-08-21 ENCOUNTER — Ambulatory Visit: Payer: PRIVATE HEALTH INSURANCE | Admitting: Diagnostic Neuroimaging

## 2017-11-14 ENCOUNTER — Encounter: Payer: Self-pay | Admitting: Gastroenterology

## 2017-12-04 ENCOUNTER — Ambulatory Visit (INDEPENDENT_AMBULATORY_CARE_PROVIDER_SITE_OTHER): Payer: Medicare Other | Admitting: Diagnostic Neuroimaging

## 2017-12-04 ENCOUNTER — Encounter: Payer: Self-pay | Admitting: Diagnostic Neuroimaging

## 2017-12-04 VITALS — BP 119/70 | HR 75 | Ht 72.0 in | Wt 289.8 lb

## 2017-12-04 DIAGNOSIS — G7000 Myasthenia gravis without (acute) exacerbation: Secondary | ICD-10-CM | POA: Diagnosis not present

## 2017-12-04 DIAGNOSIS — Z79899 Other long term (current) drug therapy: Secondary | ICD-10-CM

## 2017-12-04 MED ORDER — MYCOPHENOLATE MOFETIL 500 MG PO TABS: 1000.0000 mg | ORAL_TABLET | Freq: Two times a day (BID) | ORAL | 12 refills | Status: DC

## 2017-12-04 MED ORDER — PYRIDOSTIGMINE BROMIDE 60 MG PO TABS: 30.0000 mg | ORAL_TABLET | Freq: Three times a day (TID) | ORAL | 4 refills | Status: DC

## 2017-12-04 NOTE — Progress Notes (Signed)
[facility]  PATIENT: Philip [name] DOB: [birth year]  REFERRING CLINICIAN: Delman Cheadle, J HISTORY FROM: patient and chart review REASON FOR VISIT: follow up    HISTORICAL  CHIEF COMPLAINT:  Chief Complaint  Patient presents with  . Myasthenia gravis    rm 7, "my vision is back to normal after starting that medicine"  . Follow-up    HISTORY OF PRESENT ILLNESS:   UPDATE (12/04/17, VRP): Since last visit, doing well. Symptoms are resolved. Tolerating cellcept and mestinon. No more double vision. No generalized fatigue.   UPDATE (03/13/17, VRP): Since last visit, doing well. IVIG helped quite a bit. Tolerating pyridostigmine. No alleviating or aggravating factors. No generalized weakness but notes some non-specific "tired" feeling. No double vision or drooping eyelids.   UPDATE (01/17/17, VRP): Since last visit, doing well on pyridostigmine. It worked the next day. AchR ab are positive. Some generalized fatigue and weakness are noted and new since last visit. Went to ER for laryngospasm (?upper resp infx) and tx'd with 4 days prednisone and oral lidocaine.    PRIOR HPI (12/21/16): 72 year old male here for evaluation of double vision. Her past to 3 weeks patient has had intermittent double vision.  Symptoms present with both eyes open but improve if he closes one eye or uses an eye patch. He describes objects as being up and to the side from each other.  They slightly overlap.  This fluctuates.  Symptoms worse later in the day.  He also had some swallowing difficulties for the past 2 months without a specific cause to be found. He has some generalized fatigue as well. Patient went to [facility] eye clinic, with no specific ocular pathology found.  Patient was referred here for consideration of possible neuromuscular, neuro inflammatory or neuro vascular cause of symptoms. No unilateral numbness or weakness.  No sudden strokelike symptoms.  No headaches.  No prodromal  factors.   REVIEW OF SYSTEMS: Full 14 system review of systems performed and negative with exception of: freq urination joint pain.   ALLERGIES: No Known Allergies  HOME MEDICATIONS: Outpatient Medications Prior to Visit  Medication Sig Dispense Refill  . aspirin EC 81 MG tablet Take 81 mg by mouth daily.    [name] atorvastatin (LIPITOR) 10 MG tablet Take 10 mg by mouth daily.     . diclofenac (VOLTAREN) 75 MG EC tablet Take 75 mg by mouth daily as needed.     [name] esomeprazole (NEXIUM) 40 MG capsule Take 40 mg by mouth daily at 12 noon.    . etodolac (LODINE) 500 MG tablet Take 500 mg by mouth daily as needed.     . fluticasone furoate-vilanterol (BREO ELLIPTA) 200-25 MCG/INH AEPB Inhale 1 puff into the lungs daily as needed.     [name] lisinopril-hydrochlorothiazide (PRINZIDE,ZESTORETIC) 20-25 MG per tablet Take 1 tablet by mouth daily.     . Multiple Vitamin (MULTIVITAMIN) tablet Take 1 tablet by mouth daily.    . mycophenolate (CELLCEPT) 500 MG tablet Take 2 tablets (1,000 mg total) 2 (two) times daily by mouth. 120 tablet 12  . pyridostigmine (MESTINON) 60 MG tablet Take 0.5-1 tablets (30-60 mg total) by mouth 3 (three) times daily. 180 tablet 4  . traMADol (ULTRAM) 50 MG tablet Take 50 mg by mouth every 6 (six) hours as needed for pain.    . predniSONE (DELTASONE) 20 MG tablet 2 tabs po daily x 4 days (Patient taking differently: Take 20 mg daily with breakfast by mouth. 2 tabs po  daily x 4 days) 8 tablet 0  . amitriptyline (ELAVIL) 25 MG tablet Take 75-100 mg by mouth 4 (four) times daily as needed for sleep.      No facility-administered medications prior to visit.     PAST MEDICAL HISTORY: Past Medical History:  Diagnosis Date  . Abdominal hernia   . Asthma   . Barrett's esophagus   . Cough    over 2 weeks  . Fluid retention in legs   . H/O hiatal hernia   . Hypertension   . SOB (shortness of breath)     PAST SURGICAL HISTORY: Past Surgical History:  Procedure Laterality Date   . BALLOON DILATION N/A 10/19/2016   Procedure: BALLOON DILATION;  Surgeon: Ladene Artist, MD;  Location: [facility];  Service: Endoscopy;  Laterality: N/A;  . ESOPHAGOGASTRODUODENOSCOPY N/A 10/19/2016   Procedure: ESOPHAGOGASTRODUODENOSCOPY (EGD);  Surgeon: Ladene Artist, MD;  Location: [facility];  Service: Endoscopy;  Laterality: N/A;  . LUMBAR LAMINECTOMY     1980's    FAMILY HISTORY: Family History  Problem Relation Age of Onset  . Heart disease Mother   . Lung cancer Father   . Colon cancer Neg Hx     SOCIAL HISTORY:  Social History   Socioeconomic History  . Marital status: Single    Spouse name: Not on file  . Number of children: 0  . Years of education: 7  . Highest education level: Not on file  Occupational History    Comment: retired  Scientific laboratory technician  . Financial resource strain: Not on file  . Food insecurity:    Worry: Not on file    Inability: Not on file  . Transportation needs:    Medical: Not on file    Non-medical: Not on file  Tobacco Use  . Smoking status: Former Smoker    Last attempt to quit: 12/21/1976    Years since quitting: 40.9  . Smokeless tobacco: Never Used  Substance and Sexual Activity  . Alcohol use: Yes    Alcohol/week: 1.2 - 2.4 oz    Types: 2 - 4 Standard drinks or equivalent per week    Comment: GLASS WINE A NIGHT, 12/21/16 1 oz /wk  . Drug use: No  . Sexual activity: Not on file  Lifestyle  . Physical activity:    Days per week: Not on file    Minutes per session: Not on file  . Stress: Not on file  Relationships  . Social connections:    Talks on phone: Not on file    Gets together: Not on file    Attends religious service: Not on file    Active member of club or organization: Not on file    Attends meetings of clubs or organizations: Not on file    Relationship status: Not on file  . Intimate partner violence:    Fear of current or ex partner: Not on file    Emotionally abused: Not on file    Physically  abused: Not on file    Forced sexual activity: Not on file  Other Topics Concern  . Not on file  Social History Narrative   Lives alone   Caffeine- coffee, tea, sodas 1/2 gallon total daily     PHYSICAL EXAM  GENERAL EXAM/CONSTITUTIONAL: Vitals:  Vitals:   12/04/17 1625  BP: 119/70  Pulse: 75  Weight: 289 lb 12.8 oz (131.5 kg)  Height: 6' (1.829 m)   Body mass index is 39.3 kg/m. No exam  data present  Patient is in no distress; well developed, nourished and groomed; neck is supple  CARDIOVASCULAR:  Examination of carotid arteries is normal; no carotid bruits  IRREGULAR rate and rhythm, no murmurs  Examination of peripheral vascular system by observation and palpation is normal  EYES:  Ophthalmoscopic exam of optic discs and posterior segments is normal; no papilledema or hemorrhages  MUSCULOSKELETAL:  Gait, strength, tone, movements noted in Neurologic exam below  NEUROLOGIC: MENTAL STATUS:  No flowsheet data found.  awake, alert, oriented to person, place and time  recent and remote memory intact  normal attention and concentration  language fluent, comprehension intact, naming intact,   fund of knowledge appropriate  CRANIAL NERVE:   2nd - no papilledema on fundoscopic exam  2nd, 3rd, 4th, 6th - pupils equal and reactive to light, visual fields full to confrontation, extraocular muscles intact, no nystagmus; NO PTOSIS; NO DOUBLE VISION  5th - facial sensation symmetric  7th - facial strength symmetric  8th - hearing intact  9th - palate elevates symmetrically, uvula midline  11th - shoulder shrug symmetric  12th - tongue protrusion midline  HOARSE VOICE  MOTOR:   normal bulk and tone, full strength in the BUE, BLE  SENSORY:   normal and symmetric to light touch, temperature  ABSENT VIBRATION IN TOES  COORDINATION:   finger-nose-finger, fine finger movements SLOW  REFLEXES:   deep tendon reflexes TRACE and symmetric; ABSENT  AT ANKLES  GAIT/STATION:   SMOOTH GAIT    DIAGNOSTIC DATA (LABS, IMAGING, TESTING) - I reviewed patient records, labs, notes, testing and imaging myself where available.  Lab Results  Component Value Date   WBC 7.4 04/03/2017   HGB 12.9 (L) 04/03/2017   HCT 38.3 (L) 04/03/2017   MCV 97.2 04/03/2017   PLT 200 04/03/2017      Component Value Date/Time   NA 134 (L) 04/03/2017 2040   K 4.0 04/03/2017 2040   CL 104 04/03/2017 2040   CO2 22 04/03/2017 2040   GLUCOSE 100 (H) 04/03/2017 2040   BUN 22 (H) 04/03/2017 2040   CREATININE 0.81 04/03/2017 2040   CALCIUM 10.6 (H) 04/03/2017 2040   PROT 6.2 05/09/2007 0937   ALBUMIN 3.7 05/09/2007 0937   AST 23 05/09/2007 0937   ALT 30 05/09/2007 0937   ALKPHOS 61 05/09/2007 0937   BILITOT 1.0 05/09/2007 0937   GFRNONAA >60 04/03/2017 2040   GFRAA >60 04/03/2017 2040   No results found for: CHOL, HDL, LDLCALC, LDLDIRECT, TRIG, CHOLHDL Lab Results  Component Value Date   HGBA1C 5.4 12/21/2016   No results found for: EPPIRJJO84 Lab Results  Component Value Date   TSH 2.040 12/21/2016    AChR Binding Ab, Serum  Date Value Ref Range Status  12/21/2016 65.60 (H) 0.00 - 0.24 nmol/L Final    Comment:    **Results verified by repeat testing**                                Negative:   0.00 - 0.24                                Borderline: 0.25 - 0.40                                Positive:        >  0.40     01/10/17 MRI brain  - Abnormal MRI scan of the brain showing mild changes of chronic microvascular ischemia and generalized cerebral atrophy. Incidental changes of chronic paranasal sinusitis are noted. 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     ASSESSMENT AND PLAN  72 y.o. year old male here with fatigable, fluctuating, intermittent double vision for past 2-3 weeks, with approximately 2 months of swallowing difficulty.  Also with generalized weakness intermittently.  Has positive AchR  antibodies and good response to pyridostigmine and IVIG.  Dx: myasthenia gravis  1. Myasthenia gravis in remission ([place])   2. Long-term use of high-risk medication      PLAN:  I spent 25 minutes of face to face time with patient. Greater than 50% of time was spent in counseling and coordination of care with patient. This is necessary because patient had multiple questions regarding diagnosis, prognosis, treatment options. In summary we discussed: - Diagnostic results, impressions, or recommended diagnostic studies: myasthenia gravis pathophysiology and treatments - Prognosis: fair - good - Risks and benefits of management (treatment) options: medication mgmt - Instructions for management (treatment) or follow-up: follow up lab testing - Importance of compliance with treatment options: immunosuppressant medication  MYASTHENIA GRAVIS  - continue pyridostigmine 60mg  2-3 times a day - continue cellcept 1000mg  twice a day for longer term immunosuppression (will avoid prednisone due to potential side effects of weight gait, HTN, hyperglycemia) - repeat CBC, CMP every 3-6 months (for cellcept monitoring)  Meds ordered this encounter  Medications  . pyridostigmine (MESTINON) 60 MG tablet    Sig: Take 0.5-1 tablets (30-60 mg total) by mouth 3 (three) times daily.    Dispense:  180 tablet    Refill:  4  . mycophenolate (CELLCEPT) 500 MG tablet    Sig: Take 2 tablets (1,000 mg total) by mouth 2 (two) times daily.    Dispense:  120 tablet    Refill:  12   Orders Placed This Encounter  Procedures  . CBC with Differential/Platelet  . Comprehensive metabolic panel   Return in about 6 months (around 06/06/2018).    Penni Bombard, MD 01/01/1193, 1:74 PM Certified in Neurology, Neurophysiology and Neuroimaging  [facility] [address] [place], [place] [address] [phone]

## 2017-12-04 NOTE — Patient Instructions (Signed)
-   continue cellcept long term  - may consider to stop pyridostigmine, to see if necessary

## 2017-12-05 ENCOUNTER — Telehealth: Payer: Self-pay | Admitting: *Deleted

## 2017-12-05 LAB — CBC WITH DIFFERENTIAL/PLATELET
BASOS ABS: 0 10*3/uL (ref 0.0–0.2)
Basos: 1 %
EOS (ABSOLUTE): 0.1 10*3/uL (ref 0.0–0.4)
Eos: 2 %
HEMOGLOBIN: 12.9 g/dL — AB (ref 13.0–17.7)
Hematocrit: 38 % (ref 37.5–51.0)
Immature Grans (Abs): 0 10*3/uL (ref 0.0–0.1)
Immature Granulocytes: 0 %
LYMPHS ABS: 1.5 10*3/uL (ref 0.7–3.1)
LYMPHS: 23 %
MCH: 30.5 pg (ref 26.6–33.0)
MCHC: 33.9 g/dL (ref 31.5–35.7)
MCV: 90 fL (ref 79–97)
MONOCYTES: 10 %
Monocytes Absolute: 0.7 10*3/uL (ref 0.1–0.9)
NEUTROS ABS: 4.4 10*3/uL (ref 1.4–7.0)
Neutrophils: 64 %
Platelets: 217 10*3/uL (ref 150–450)
RBC: 4.23 x10E6/uL (ref 4.14–5.80)
RDW: 14.3 % (ref 12.3–15.4)
WBC: 6.8 10*3/uL (ref 3.4–10.8)

## 2017-12-05 LAB — COMPREHENSIVE METABOLIC PANEL
ALBUMIN: 4 g/dL (ref 3.5–4.8)
ALT: 17 IU/L (ref 0–44)
AST: 16 IU/L (ref 0–40)
Albumin/Globulin Ratio: 2 (ref 1.2–2.2)
Alkaline Phosphatase: 75 IU/L (ref 39–117)
BUN / CREAT RATIO: 22 (ref 10–24)
BUN: 19 mg/dL (ref 8–27)
Bilirubin Total: 0.6 mg/dL (ref 0.0–1.2)
CALCIUM: 11.3 mg/dL — AB (ref 8.6–10.2)
CO2: 21 mmol/L (ref 20–29)
CREATININE: 0.88 mg/dL (ref 0.76–1.27)
Chloride: 103 mmol/L (ref 96–106)
GFR calc non Af Amer: 86 mL/min/{1.73_m2} (ref >59)
GFR, EST AFRICAN AMERICAN: 100 mL/min/{1.73_m2} (ref >59)
GLUCOSE: 97 mg/dL (ref 65–99)
Globulin, Total: 2 g/dL (ref 1.5–4.5)
Potassium: 4.6 mmol/L (ref 3.5–5.2)
Sodium: 138 mmol/L (ref 134–144)
TOTAL PROTEIN: 6 g/dL (ref 6.0–8.5)

## 2017-12-05 NOTE — Telephone Encounter (Signed)
Spoke with patient and informed him that his labs are stable. Advised he continue his current meds, and his labs will continue to be monitored. He stated that before he saw Dr Leta Baptist he was having swallowing difficulties. Since starting pyridostigmine and cellcept he has not had swallowing problems. He is asking which of those medicines has helped his swallowing. He stated Dr Leta Baptist told him to wean off pyridostigmine. He said that he wants to know if that's the medicine which improved his swallowing. This RN reviewed Dr Gladstone Lighter plan per office note and informed patient that there was no mention of coming off pyridostigmine. The patient again stated he was told he would be coming off that medicine. He would like his question answered about which medicine helped him.  This RN advised will discuss with Dr Leta Baptist and call him back. He verbalized understanding, appreciation.

## 2017-12-05 NOTE — Telephone Encounter (Signed)
Spoke with patient and advised per Dr Leta Baptist he may try stopping pyridostigmine. If he develops double vision or trouble swallowing he may restart it. Advised he continue Cellcept for long term maintenance. He asked if pyridostigmine has long term side effects. This RN advised he read literature and discuss with his pharmacist as well. He verbalized understanding, appreciation of call back.

## 2017-12-05 NOTE — Telephone Encounter (Signed)
Ok to try stopping pyridostigmine to see if still necessary. May restart if he has double vision or swallowing issues.   Continue cellcept.  -VRP

## 2017-12-18 ENCOUNTER — Telehealth: Payer: Self-pay | Admitting: *Deleted

## 2017-12-18 NOTE — Telephone Encounter (Signed)
Dr Loletha Carrow,  Mr Philip [name] is scheduled for a colon with your, 10 yr recall, on 01-12-18 Friday.  He had an EGD 10-19-2016 for some dysphagia and [facility] with Dr Fuller Plan- He saw Alonza Bogus 10-27-2016 and in her note she stated he needed a 6 month follow up EGD- and that he was due for a colon and to possibly schedule them together - he did not have another EGD- he is only scheduled for a colon-  Does he need to have ECL at this time?  Please advise and thanks for your time, Marijean Niemann

## 2017-12-19 NOTE — Telephone Encounter (Signed)
Called patient and explained need for ECL. He was agreeable. Changed appointment date and time to accommodate ECL.

## 2017-12-19 NOTE — Telephone Encounter (Signed)
Thanks very much for the chart review. Yes, he does need an EGD.  He had Barrett's with low grade dysplasia in June 2018 and was supposed to have had an EGD by the end of 2019.  So EGD and colonoscopy needed now, yes they can and should be done together.

## 2017-12-28 ENCOUNTER — Ambulatory Visit (AMBULATORY_SURGERY_CENTER): Payer: Self-pay

## 2017-12-28 VITALS — Ht 72.0 in | Wt 294.6 lb

## 2017-12-28 DIAGNOSIS — Z1211 Encounter for screening for malignant neoplasm of colon: Secondary | ICD-10-CM

## 2017-12-28 DIAGNOSIS — K2270 Barrett's esophagus without dysplasia: Secondary | ICD-10-CM

## 2017-12-28 MED ORDER — PEG 3350-KCL-NA BICARB-NACL 420 G PO SOLR: 4000.0000 mL | Freq: Once | ORAL | 0 refills | Status: AC

## 2017-12-28 MED ORDER — NA SULFATE-K SULFATE-MG SULF 17.5-3.13-1.6 GM/177ML PO SOLN: 1.0000 | Freq: Once | ORAL | 0 refills | Status: AC

## 2017-12-28 NOTE — Progress Notes (Signed)
Per pt, no allergies to soy or egg products.Pt not taking any weight loss meds or using  O2 at home.  Pt refused emmi video. 

## 2017-12-29 ENCOUNTER — Telehealth: Payer: Self-pay | Admitting: Gastroenterology

## 2017-12-29 ENCOUNTER — Encounter: Payer: Self-pay | Admitting: *Deleted

## 2017-12-29 NOTE — Telephone Encounter (Signed)
Returned patients call and noted that Myasthenia Gravis was added to his medical history.   Riki Sheer, LPN ( [facility] )

## 2018-01-08 ENCOUNTER — Telehealth: Payer: Self-pay | Admitting: Gastroenterology

## 2018-01-08 NOTE — Telephone Encounter (Signed)
Called pt again with no answer- [facility] Tuesday

## 2018-01-08 NOTE — Telephone Encounter (Signed)
Called pt, no answer, left message for patient to call us back tomorrow with his questions. Explained the phones turn off at 5 pm.

## 2018-01-09 NOTE — Telephone Encounter (Signed)
Patient wanted to verify what prep he was to use for his procedure. Pt aware to pick up Suprep.

## 2018-01-12 ENCOUNTER — Encounter: Payer: Medicare Other | Admitting: Gastroenterology

## 2018-01-22 ENCOUNTER — Encounter: Payer: Self-pay | Admitting: Gastroenterology

## 2018-01-22 ENCOUNTER — Ambulatory Visit (AMBULATORY_SURGERY_CENTER): Payer: Medicare Other | Admitting: Gastroenterology

## 2018-01-22 VITALS — BP 99/63 | HR 78 | Temp 97.8°F | Resp 12 | Ht 72.0 in | Wt 294.0 lb

## 2018-01-22 DIAGNOSIS — Z1211 Encounter for screening for malignant neoplasm of colon: Secondary | ICD-10-CM

## 2018-01-22 DIAGNOSIS — K22710 Barrett's esophagus with low grade dysplasia: Secondary | ICD-10-CM | POA: Diagnosis not present

## 2018-01-22 DIAGNOSIS — C160 Malignant neoplasm of cardia: Secondary | ICD-10-CM | POA: Diagnosis not present

## 2018-01-22 MED ORDER — SODIUM CHLORIDE 0.9 % IV SOLN: 500.0000 mL | Freq: Once | INTRAVENOUS | Status: DC

## 2018-01-22 NOTE — Progress Notes (Signed)
A and O x3. Report to RN. Tolerated MAC anesthesia well.

## 2018-01-22 NOTE — Op Note (Signed)
[facility] Patient Name: Philip [name] Procedure Date: 01/22/2018 3:30 PM MRN: [id number] Endoscopist: Mallie Mussel L. Loletha Carrow , MD Age: 72 Referring MD:  Date of Birth: 11-04-1945 Gender: Male Account #: [id number] Procedure:                Colonoscopy Indications:              Screening for colorectal malignant neoplasm (last                            colonoscopy 04/2007) Medicines:                Monitored Anesthesia Care Procedure:                Pre-Anesthesia Assessment:                           - Prior to the procedure, a History and Physical                            was performed, and patient medications and                            allergies were reviewed. The patient's tolerance of                            previous anesthesia was also reviewed. The risks                            and benefits of the procedure and the sedation                            options and risks were discussed with the patient.                            All questions were answered, and informed consent                            was obtained. Anticoagulants: The patient has taken                            aspirin. It was decided not to withhold this                            medication prior to the procedure. ASA Grade                            Assessment: III - A patient with severe systemic                            disease. After reviewing the risks and benefits,                            the patient was deemed in satisfactory condition to  undergo the procedure.                           After obtaining informed consent, the colonoscope                            was passed under direct vision. Throughout the                            procedure, the patient's blood pressure, pulse, and                            oxygen saturations were monitored continuously. The                            Colonoscope was introduced through the anus and              advanced to the the cecum, identified by                            appendiceal orifice and ileocecal valve. The                            colonoscopy was performed with moderate difficulty                            due to a redundant colon, significant looping and                            the patient's body habitus. Successful completion                            of the procedure was aided by using manual                            pressure. The patient tolerated the procedure                            fairly well. The quality of the bowel preparation                            was good. The quality of the bowel preparation was                            evaluated using the BBPS [facility] Bowel Preparation                            Scale) with scores of: Right Colon = 2, Transverse                            Colon = 2 and Left Colon = 2. The total BBPS score                            equals  6 (after lavage). Scope In: 4:00:16 PM Scope Out: 4:23:18 PM Scope Withdrawal Time: 0 hours 7 minutes 53 seconds  Total Procedure Duration: 0 hours 23 minutes 2 seconds  Findings:                 The perianal and digital rectal examinations were                            normal.                           The colon (entire examined portion) was                            significantly redundant.                           The exam was otherwise without abnormality on                            direct and retroflexion views. Complications:            No immediate complications. Estimated Blood Loss:     Estimated blood loss: none. Impression:               - Redundant colon.                           - The examination was otherwise normal on direct                            and retroflexion views.                           - No specimens collected. Recommendation:           - Patient has a contact number available for                            emergencies. The signs and symptoms of  potential                            delayed complications were discussed with the                            patient. Return to normal activities tomorrow.                            Written discharge instructions were provided to the                            patient.                           - Resume previous diet.                           - Continue present medications.                           -  Based on current guidelines, no repeat screening                            colonoscopy due to age. Henry L. Loletha Carrow, MD 01/22/2018 4:35:54 PM This report has been signed electronically.

## 2018-01-22 NOTE — Patient Instructions (Signed)
YOU HAD AN ENDOSCOPIC PROCEDURE TODAY: Refer to the procedure report and other information in the discharge instructions given to you for any specific questions about what was found during the examination. If this information does not answer your questions, please call [facility] office at [phone] to clarify.  ° °YOU SHOULD EXPECT: Some feelings of bloating in the abdomen. Passage of more gas than usual. Walking can help get rid of the air that was put into your GI tract during the procedure and reduce the bloating. If you had a lower endoscopy (such as a colonoscopy or flexible sigmoidoscopy) you may notice spotting of blood in your stool or on the toilet paper. Some abdominal soreness may be present for a day or two, also. ° °DIET: Your first meal following the procedure should be a light meal and then it is ok to progress to your normal diet. A half-sandwich or bowl of soup is an example of a good first meal. Heavy or fried foods are harder to digest and may make you feel nauseous or bloated. Drink plenty of fluids but you should avoid alcoholic beverages for 24 hours. If you had a esophageal dilation, please see attached instructions for diet.   ° °ACTIVITY: Your care partner should take you home directly after the procedure. You should plan to take it easy, moving slowly for the rest of the day. You can resume normal activity the day after the procedure however YOU SHOULD NOT DRIVE, use power tools, machinery or perform tasks that involve climbing or major physical exertion for 24 hours (because of the sedation medicines used during the test).  ° °SYMPTOMS TO REPORT IMMEDIATELY: °A gastroenterologist can be reached at any hour. Please call [phone]  for any of the following symptoms:  °Following lower endoscopy (colonoscopy, flexible sigmoidoscopy) °Excessive amounts of blood in the stool  °Significant tenderness, worsening of abdominal pains  °Swelling of the abdomen that is new, acute  °Fever of 100° or  higher  °Following upper endoscopy (EGD, EUS, ERCP, esophageal dilation) °Vomiting of blood or coffee ground material  °New, significant abdominal pain  °New, significant chest pain or pain under the shoulder blades  °Painful or persistently difficult swallowing  °New shortness of breath  °Black, tarry-looking or red, bloody stools ° °FOLLOW UP:  °If any biopsies were taken you will be contacted by phone or by letter within the next 1-3 weeks. Call [phone]  if you have not heard about the biopsies in 3 weeks.  °Please also call with any specific questions about appointments or follow up tests. ° °

## 2018-01-22 NOTE — Progress Notes (Signed)
Called to room to assist during endoscopic procedure.  Patient ID and intended procedure confirmed with present staff. Received instructions for my participation in the procedure from the performing physician.  

## 2018-01-22 NOTE — Progress Notes (Signed)
Pt's states no medical or surgical changes since previsit or office visit. 

## 2018-01-22 NOTE — Op Note (Signed)
[name] Procedure Date: 01/22/2018 3:31 PM MRN: [id number] Endoscopist: Mallie Mussel L. Loletha Carrow , MD Age: 72 Referring MD:  Date of Birth: 07-09-1945 Gender: Male Account #: [id number] Procedure:                Upper GI endoscopy Indications:              Barrett's low grade dysplasia Medicines:                Monitored Anesthesia Care Procedure:                Pre-Anesthesia Assessment:                           - Prior to the procedure, a History and Physical                            was performed, and patient medications and                            allergies were reviewed. The patient's tolerance of                            previous anesthesia was also reviewed. The risks                            and benefits of the procedure and the sedation                            options and risks were discussed with the patient.                            All questions were answered, and informed consent                            was obtained. Anticoagulants: The patient has taken                            aspirin. It was decided not to withhold this                            medication prior to the procedure. ASA Grade                            Assessment: III - A patient with severe systemic                            disease. After reviewing the risks and benefits,                            the patient was deemed in satisfactory condition to                            undergo the procedure.  After obtaining informed consent, the endoscope was                            passed under direct vision. Throughout the                            procedure, the patient's blood pressure, pulse, and                            oxygen saturations were monitored continuously. The                            Endoscope was introduced through the mouth, and                            advanced to the second part of duodenum. The upper                             GI endoscopy was accomplished without difficulty.                            The patient tolerated the procedure fairly well. Scope In: Scope Out: Findings:                 There were esophageal mucosal changes secondary to                            established short-segment Barrett's disease present                            at the gastroesophageal junction. The maximum                            longitudinal extent of these mucosal changes was 1                            cm in length.                           A single small, inflamed mucosal nodule was found                            at the gastroesophageal junction within the                            Barrett's esophagus. Biopsies were taken with a                            cold forceps for histology.                           Patchy mildly erythematous mucosa without bleeding                            was found in the prepyloric region  of the stomach.                           The cardia and gastric fundus were normal on                            retroflexion.                           A single 12 mm submucosal nodule was found in the                            second portion of the duodenum.                           The exam of the duodenum was otherwise normal. Complications:            No immediate complications. Estimated Blood Loss:     Estimated blood loss was minimal. Impression:               - Esophageal mucosal changes secondary to                            established short-segment Barrett's disease.                           - Mucosal nodule found in the esophagus. Biopsied.                           - Erythematous mucosa in the prepyloric region of                            the stomach.                           - Submucosal nodule found in the duodenum. Recommendation:           - Patient has a contact number available for                            emergencies. The signs and  symptoms of potential                            delayed complications were discussed with the                            patient. Return to normal activities tomorrow.                            Written discharge instructions were provided to the                            patient.                           - Resume previous diet.                           -  Continue present medications.                           - Await pathology results.                           - See the other procedure note for documentation of                            additional recommendations. [name] L. Loletha Carrow, MD 01/22/2018 4:32:47 PM This report has been signed electronically.

## 2018-01-23 ENCOUNTER — Telehealth: Payer: Self-pay | Admitting: *Deleted

## 2018-01-23 NOTE — Telephone Encounter (Signed)
  Follow up Call-  Call back number 01/22/2018 07/29/2015  Post procedure Call Back phone  # [phone]  Permission to leave phone message Yes Yes  Some recent data might be hidden     Patient questions:  Do you have a fever, pain , or abdominal swelling? No. Pain Score  0 *  Have you tolerated food without any problems? Yes.    Have you been able to return to your normal activities? Yes.    Do you have any questions about your discharge instructions: Diet   No. Medications  No. Follow up visit  No.  Do you have questions or concerns about your Care? No.  Actions: * If pain score is 4 or above: No action needed, pain <4.

## 2018-01-25 ENCOUNTER — Other Ambulatory Visit: Payer: Self-pay

## 2018-01-25 DIAGNOSIS — C159 Malignant neoplasm of esophagus, unspecified: Secondary | ICD-10-CM

## 2018-01-30 ENCOUNTER — Telehealth: Payer: Self-pay | Admitting: Nurse Practitioner

## 2018-01-30 NOTE — Telephone Encounter (Signed)
Pt has been scheduled to see Wilnette Kales on 10/3 at 130pm. Pt aware to arrive 30 minutes early.

## 2018-01-31 ENCOUNTER — Other Ambulatory Visit (INDEPENDENT_AMBULATORY_CARE_PROVIDER_SITE_OTHER): Payer: Medicare Other

## 2018-01-31 DIAGNOSIS — C159 Malignant neoplasm of esophagus, unspecified: Secondary | ICD-10-CM | POA: Diagnosis not present

## 2018-01-31 LAB — CREATININE, SERUM: CREATININE: 0.87 mg/dL (ref 0.40–1.50)

## 2018-01-31 LAB — BUN: BUN: 17 mg/dL (ref 6–23)

## 2018-02-01 ENCOUNTER — Encounter: Payer: Self-pay | Admitting: Nurse Practitioner

## 2018-02-01 ENCOUNTER — Telehealth: Payer: Self-pay | Admitting: Nurse Practitioner

## 2018-02-01 ENCOUNTER — Inpatient Hospital Stay: Payer: Medicare Other | Attending: Nurse Practitioner | Admitting: Nurse Practitioner

## 2018-02-01 VITALS — BP 144/81 | HR 93 | Temp 98.6°F | Resp 18 | Ht 72.0 in | Wt 288.1 lb

## 2018-02-01 DIAGNOSIS — Z79899 Other long term (current) drug therapy: Secondary | ICD-10-CM | POA: Diagnosis not present

## 2018-02-01 DIAGNOSIS — Z9049 Acquired absence of other specified parts of digestive tract: Secondary | ICD-10-CM | POA: Insufficient documentation

## 2018-02-01 DIAGNOSIS — D649 Anemia, unspecified: Secondary | ICD-10-CM | POA: Insufficient documentation

## 2018-02-01 DIAGNOSIS — K219 Gastro-esophageal reflux disease without esophagitis: Secondary | ICD-10-CM | POA: Insufficient documentation

## 2018-02-01 DIAGNOSIS — Z87891 Personal history of nicotine dependence: Secondary | ICD-10-CM | POA: Diagnosis not present

## 2018-02-01 DIAGNOSIS — Z85828 Personal history of other malignant neoplasm of skin: Secondary | ICD-10-CM | POA: Diagnosis not present

## 2018-02-01 DIAGNOSIS — K449 Diaphragmatic hernia without obstruction or gangrene: Secondary | ICD-10-CM | POA: Insufficient documentation

## 2018-02-01 DIAGNOSIS — C160 Malignant neoplasm of cardia: Secondary | ICD-10-CM | POA: Insufficient documentation

## 2018-02-01 DIAGNOSIS — K2270 Barrett's esophagus without dysplasia: Secondary | ICD-10-CM | POA: Insufficient documentation

## 2018-02-01 DIAGNOSIS — I10 Essential (primary) hypertension: Secondary | ICD-10-CM | POA: Insufficient documentation

## 2018-02-01 DIAGNOSIS — J45909 Unspecified asthma, uncomplicated: Secondary | ICD-10-CM | POA: Insufficient documentation

## 2018-02-01 DIAGNOSIS — Z7982 Long term (current) use of aspirin: Secondary | ICD-10-CM | POA: Insufficient documentation

## 2018-02-01 DIAGNOSIS — G4733 Obstructive sleep apnea (adult) (pediatric): Secondary | ICD-10-CM | POA: Insufficient documentation

## 2018-02-01 NOTE — Progress Notes (Addendum)
[facility]  [phone] Fax:([phone]) [place] Note   Patient Care Team: [name], MD as PCP - General (Family Medicine) 02/01/2018  CHIEF COMPLAINTS/PURPOSE OF CONSULTATION:  Esophagus cancer; consult requested by Dr. Wilfrid Lund   SUMMARY OF ONCOLOGY HISTORY   Malignant neoplasm of gastroesophageal junction ([facility])   01/22/2018 Initial Biopsy    Diagnosis Surgical [P], GE junction nodule BX - POORLY 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    01/22/2018 Procedure    COLONOSCOPY IMPRESSION - Redundant colon. - The examination was otherwise normal on direct and retroflexion views. - No specimens collected.  UPPER ENDOSCOPY IMPRESSION: - Esophageal mucosal changes secondary to established short-segment Barrett's disease. - Mucosal nodule found in the esophagus. Biopsied. - Erythematous mucosa in the prepyloric region of the stomach. - Submucosal nodule found in the duodenum.     02/01/2018 Initial Diagnosis    Malignant neoplasm of gastroesophageal junction ([facility])     HISTORY OF PRESENTING ILLNESS:  Philip [name] 72 y.o. male with history of barrett's esophagus is here because of newly diagnosed esophageal cancer. Had been getting routine upper endoscopies every 1-3 years, biopsy in 2014 indefinite for dysplasia. Last endoscopy in 09/2016. Presented for overdue routine colonoscopy and f/u endoscopy and found to have a single small inflamed mucosal nodule at the GE junction within the barrett's esophagus. 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 He was referred to our office; staging work up is pending for 10/7.   Past medical history is significant for barrett's esophagus, GERD, diverticulosis, OSA on CPAP, and myasthenia gravis on mycophenolate, followed by neurologist Dr. Leta Baptist. He has chronic  pain syndrome with low back pain related to falling out of a helicopter while serving in the [employer] in [place], previously followed by [facility] in [place].   Socially he is single, no children. He has a few cousins nearby who may be available for support. He drives himself and is independent of ADLs. He smoked 1 PPD x7 years before quitting cigarettes in 1978. Drinks wine occasionally. Denies other drug use. He has previous personal history of non-melanoma skin cancer removed from his nose. Family history is significant for lung cancer in his father who was a smoker. Denies other colon, breast, GYN, or extracolonic malignancies.   Today he feels well. Denies dysphagia, decreased appetite, weight loss, fatigue, or n/v/c/d. Has had GERD for over 20 years, on nexium. He is asymptomatic of MG. Has chronic low back pain and numbness in his feet. Mild DOE at baseline. Denies cough, chest pain, fever, or chills.   MEDICAL HISTORY:  Past Medical History:  Diagnosis Date  . Abdominal hernia   . Asthma   . Barrett's esophagus   . Cataract    Bil surgery  . Cough    over 2 weeks  . Fluid retention in legs   . GERD (gastroesophageal reflux disease)   . H/O hiatal hernia   . Hypertension   . Myasthenia gravis ([place])   . Sleep apnea    don't use c-pap at home/ tried for several years  . SOB (shortness of breath)     SURGICAL HISTORY: Past Surgical History:  Procedure Laterality Date  . BALLOON DILATION N/A 10/19/2016   Procedure: BALLOON DILATION;  Surgeon: Ladene Artist, MD;  Location: [facility];  Service: Endoscopy;  Laterality: N/A;  . ESOPHAGOGASTRODUODENOSCOPY N/A 10/19/2016  Procedure: ESOPHAGOGASTRODUODENOSCOPY (EGD);  Surgeon: Ladene Artist, MD;  Location: [facility];  Service: Endoscopy;  Laterality: N/A;  . LUMBAR LAMINECTOMY     1980's    SOCIAL HISTORY: Social History   Socioeconomic History  . Marital status: Single    Spouse name: Not on file  .  Number of children: 0  . Years of education: 32  . Highest education level: Not on file  Occupational History    Comment: retired  Scientific laboratory technician  . Financial resource strain: Not on file  . Food insecurity:    Worry: Not on file    Inability: Not on file  . Transportation needs:    Medical: Not on file    Non-medical: Not on file  Tobacco Use  . Smoking status: Former Smoker    Packs/day: 1.00    Years: 7.00    Pack years: 7.00    Last attempt to quit: 12/21/1976    Years since quitting: 41.1  . Smokeless tobacco: Never Used  Substance and Sexual Activity  . Alcohol use: Yes    Alcohol/week: 2.0 - 4.0 standard drinks    Types: 2 - 4 Standard drinks or equivalent per week    Comment: GLASS WINE A NIGHT, 12/21/16 1 oz /wk  . Drug use: No  . Sexual activity: Not on file  Lifestyle  . Physical activity:    Days per week: Not on file    Minutes per session: Not on file  . Stress: Not on file  Relationships  . Social connections:    Talks on phone: Not on file    Gets together: Not on file    Attends religious service: Not on file    Active member of club or organization: Not on file    Attends meetings of clubs or organizations: Not on file    Relationship status: Not on file  . Intimate partner violence:    Fear of current or ex partner: Not on file    Emotionally abused: Not on file    Physically abused: Not on file    Forced sexual activity: Not on file  Other Topics Concern  . Not on file  Social History Narrative   Lives alone   Caffeine- coffee, tea, sodas 1/2 gallon total daily    FAMILY HISTORY: Family History  Problem Relation Age of Onset  . Heart disease Mother   . Lung cancer Father   . Colon cancer Neg Hx     ALLERGIES:  has No Known Allergies.  MEDICATIONS:  Current Outpatient Medications  Medication Sig Dispense Refill  . ALBUTEROL IN Inhale into the lungs as needed.    [name] amitriptyline (ELAVIL) 25 MG tablet Take 75-100 mg by mouth 4 (four)  times daily as needed for sleep.     [name] aspirin EC 81 MG tablet Take 81 mg by mouth daily.    [name] atorvastatin (LIPITOR) 10 MG tablet Take 10 mg by mouth daily.     . diclofenac (VOLTAREN) 75 MG EC tablet Take 75 mg by mouth daily as needed.     [name] esomeprazole (NEXIUM) 40 MG capsule Take 40 mg by mouth daily at 12 noon.    [name] lisinopril-hydrochlorothiazide (PRINZIDE,ZESTORETIC) 20-25 MG per tablet Take 1 tablet by mouth daily.     . Multiple Vitamin (MULTIVITAMIN) tablet Take 1 tablet by mouth daily.    . mycophenolate (CELLCEPT) 500 MG tablet Take 2 tablets (1,000 mg total) by mouth 2 (two) times daily. [place]  tablet 12  . traMADol (ULTRAM) 50 MG tablet Take 50 mg by mouth every 6 (six) hours as needed for pain.    [name] etodolac (LODINE) 500 MG tablet Take 500 mg by mouth daily as needed.      No current facility-administered medications for this visit.     REVIEW OF SYSTEMS:   Constitutional: Denies fevers, chills, weight loss, or abnormal night sweats Eyes: Denies blurriness of vision, double vision or watery eyes Ears, nose, mouth, throat, and face: Denies mucositis or sore throat Respiratory: Denies cough or wheezes (+) DOE at baseline  Cardiovascular: Denies palpitation, chest discomfort or lower extremity swelling Gastrointestinal:  Denies nausea, vomiting, constipation, diarrhea, dysphagia (+) GERD  Skin: Denies abnormal skin rashes Lymphatics: Denies new lymphadenopathy or easy bruising Neurological:Denies tingling or new weaknesses (+) numbness in feet  Behavioral/Psych: Mood is stable, no new changes  MSK: (+) chronic low back pain  All other systems were reviewed with the patient and are negative.  PHYSICAL EXAMINATION: ECOG PERFORMANCE STATUS: 0 - Asymptomatic  Vitals:   02/01/18 1341  BP: (!) 144/81  Pulse: 93  Resp: 18  Temp: 98.6 F (37 C)  SpO2: 96%   Filed Weights   02/01/18 1341  Weight: 288 lb 1.6 oz (130.7 kg)    GENERAL:alert, no distress and  comfortable SKIN: no rashes or significant lesions EYES: sclera clear OROPHARYNX:no thrush or ulcers  LYMPH:  no palpable cervical, supraclavicular, or axillary lymphadenopathy LUNGS: clear to auscultation with normal breathing effort HEART: regular rate & rhythm, no lower extremity edema ABDOMEN:abdomen soft, round, non-tender and normal bowel sounds Musculoskeletal:no cyanosis of digits and no clubbing  PSYCH: alert & oriented x 3 with fluent speech NEURO: no focal motor deficits  LABORATORY DATA:  I have reviewed the data as listed CBC Latest Ref Rng & Units 12/04/2017 04/03/2017 01/04/2017  WBC 3.4 - 10.8 x10E3/uL 6.8 7.4 -  Hemoglobin 13.0 - 17.7 g/dL 12.9(L) 12.9(L) 12.6(L)  Hematocrit 37.5 - 51.0 % 38.0 38.3(L) 37.0(L)  Platelets 150 - 450 x10E3/uL 217 200 -   CMP Latest Ref Rng & Units 01/31/2018 12/04/2017 04/03/2017  Glucose 65 - 99 mg/dL - 97 100(H)  BUN 6 - 23 mg/dL 17 19 22(H)  Creatinine 0.40 - 1.50 mg/dL 0.87 0.88 0.81  Sodium 134 - 144 mmol/L - 138 134(L)  Potassium 3.5 - 5.2 mmol/L - 4.6 4.0  Chloride 96 - 106 mmol/L - 103 104  CO2 20 - 29 mmol/L - 21 22  Calcium 8.6 - 10.2 mg/dL - 11.3(H) 10.6(H)  Total Protein 6.0 - 8.5 g/dL - 6.0 -  Total Bilirubin 0.0 - 1.2 mg/dL - 0.6 -  Alkaline Phos 39 - 117 IU/L - 75 -  AST 0 - 40 IU/L - 16 -  ALT 0 - 44 IU/L - 17 -     PATHOLOGY  Diagnosis 01/22/18 Surgical [P], GE junction nodule BX - POORLY 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  PROCEDURE:  01/22/18  COLONOSCOPY IMPRESSION - Redundant colon. - The examination was otherwise normal on direct and retroflexion views. - No specimens collected.  UPPER ENDOSCOPY IMPRESSION: - Esophageal mucosal changes secondary to established short-segment Barrett's disease. - Mucosal nodule found in the esophagus. Biopsied. - Erythematous mucosa in the prepyloric region of the stomach. - Submucosal nodule found in the  duodenum.   RADIOGRAPHIC STUDIES: I have personally reviewed the radiological images as listed and agreed with the findings in the report. No  results found.  ASSESSMENT & PLAN: 72 year old caucasian male with barrett's esophagus found to have mass at GE junction  1. Poorly differentiated adenocarcinoma of GE junction -we reviewed his medical record with the patient in detail. He has a small mass at the GE junction in the setting of barrett's esophagus; he is asymptomatic.  -His staging work up is pending, CT CAP to be done on 10/7. He is aware of the appointments.  -Dr. Burr Medico discussed treatment options will depend on staging. If CT is negative for metastasis, he would then undergo EUS for staging. If T1 or T2 lesion, he would likely require surgery only to cure his cancer. If T3 or greater, or positive lymph nodes, his treatment would likely include neoadjuvant chemoradiation then surgery. He is being referred to Dr. Servando Snare. Will hold on referring to radiation oncology for now, pending work up. He may be interested in getting treatment in [place], which is closer to home. -Dr. Burr Medico reviewed the aggressive nature of GE junction cancer, with a high risk of recurrence even after complete resection.  -He does not have significant comorbidities and a good performance status, he would likely tolerate aggressive treatment well.  -The patient has limited social support, transportation is a concern. I have referred him to social work. -we will f/u by phone after CT and EUS; we plan to see him back in 3 weeks hopefully after he sees Dr. Servando Snare, to finalize his treatment plan.   2. Anemia -He has mild normocytic anemia from 12/2016. Hgb 12.9 on 12/04/17.  -will check ferritin at next visit.   PLAN: -Medical record reviewed -CT CAP 10/7, EUS pending next week  -Referral to Dr. Servando Snare and social work -F/u via phone after CT, EUS -F/u in 3 weeks to finalize treatment plan  Orders Placed This  Encounter  Procedures  . CBC with Differential ([facility] Only)    Standing Status:   Standing    Number of Occurrences:   20    Standing Expiration Date:   02/02/2019  . CMP ([facility] only)    Standing Status:   Standing    Number of Occurrences:   20    Standing Expiration Date:   02/02/2019  . CEA (IN HOUSE-[facility])    Standing Status:   Standing    Number of Occurrences:   20    Standing Expiration Date:   02/02/2019  . Iron and TIBC    Standing Status:   Standing    Number of Occurrences:   10    Standing Expiration Date:   02/02/2019  . Ferritin    Standing Status:   Standing    Number of Occurrences:   10    Standing Expiration Date:   02/02/2019  . Ambulatory referral to Cardiothoracic Surgery    Referral Priority:   Routine    Referral Type:   Surgical    Referral Reason:   Specialty Services Required    Requested Specialty:   Cardiothoracic Surgery    Number of Visits Requested:   1  . Ambulatory referral to Social Work    Referral Priority:   Urgent    Referral Type:   Consultation    Referral Reason:   Specialty Services Required    Number of Visits Requested:   1    All questions were answered. The patient knows to call the clinic with any problems, questions or concerns.   [name], NP 02/01/2018   Addendum I  have seen the patient, examined him. I agree with the assessment and and plan and have edited the notes.   Philip [name] is a 72 yo Caucasian male, with long history of Barrett's esophagus, was found to have a small ulcerative lesion in the GE junction on his routine surveillance EGD.  He is asymptomatic.  His biopsy unfortunately showed adenocarcinoma.  He is scheduled for staging CT scan next Monday.  This is likely early stage cancer, he is tolerate asymptomatic.  We discussed the indication of neoadjuvant chemoradiation, for locally advanced disease (T3/4 or node positive), followed by esophagectomy and partial gastrostomy.  I will refer him to  see cardiothoracic surgeon Dr. Servando Snare.  If his EUS shows T1 or T2 lesion, he will likely have surgery alone.  We also discussed the aggressive nature of esophageal cancer, high risk of recurrence, and surveillance after surgery.  I will call him next week after his staging CT scan.  If scan does not show distant metastasis or obvious nodal metastasis, Dr. Ardis Hughs will perform EUS for staging.  And will make a final decision on treatment plan after that.   If he needs neoadjuvant therapy, we will schedule him to see our social worker, dietitian, and supportive team.  He lives alone, has limited social support.  All questions were answered.  Truitt Merle  02/01/2018

## 2018-02-01 NOTE — Telephone Encounter (Signed)
Pt scheduled with [facility] due to YF avail. Pt requested afternoon appts.

## 2018-02-05 ENCOUNTER — Ambulatory Visit (INDEPENDENT_AMBULATORY_CARE_PROVIDER_SITE_OTHER)
Admission: RE | Admit: 2018-02-05 | Discharge: 2018-02-05 | Disposition: A | Discharge status: Home / Self Care | Payer: Medicare Other | Source: Ambulatory Visit | Attending: Gastroenterology | Admitting: Gastroenterology

## 2018-02-05 DIAGNOSIS — C159 Malignant neoplasm of esophagus, unspecified: Secondary | ICD-10-CM

## 2018-02-05 MED ORDER — IOPAMIDOL (ISOVUE-300) INJECTION 61%
100.0000 mL | Freq: Once | INTRAVENOUS | Status: AC | PRN
  Administered 2018-02-05: 100 mL via INTRAVENOUS

## 2018-02-07 ENCOUNTER — Telehealth: Payer: Self-pay

## 2018-02-07 NOTE — Telephone Encounter (Signed)
-----   Message from Milus Banister, MD sent at 02/07/2018  7:28 AM EDT ----- All We'll get him in for EUS staging.  Thanks  Natia Fahmy, He needs upper EUS, radial, MAC sedation for esophageal cancer staging, first available time with myself or Dr. Rush Landmark.  Thanks  Chester Holstein, [place]   ----- Message ----- From: Truitt Merle, MD Sent: 02/07/2018   7:12 AM EDT To: Milus Banister, MD, [name], NP, #  Dan,  His CT scan was negative for node or distant mets, could you please get him in for EUS esophageal ca staging?  Thanks  Krista Blue

## 2018-02-08 ENCOUNTER — Other Ambulatory Visit: Payer: Self-pay

## 2018-02-08 ENCOUNTER — Encounter (HOSPITAL_COMMUNITY): Payer: Self-pay | Admitting: *Deleted

## 2018-02-08 DIAGNOSIS — C159 Malignant neoplasm of esophagus, unspecified: Secondary | ICD-10-CM

## 2018-02-08 NOTE — Telephone Encounter (Signed)
Left message on machine to call back EUS scheduled on 02/12/18 at 730 am [facility] Dr Rush Landmark

## 2018-02-08 NOTE — Telephone Encounter (Signed)
EUS scheduled, pt instructed and medications reviewed.  Patient instructions mailed to home.  Patient to call with any questions or concerns.  

## 2018-02-08 NOTE — Progress Notes (Addendum)
Mr Sorter denies chest pain or shortness of breath.  Patient has had a history of swelling in his legs, that is not present at this time, per patient and HCTZ has been stopped.  Patient was diagnosed with Myasthenia Gravis in 2018 and started on Cellcept by his neurologist , Dr Leta Baptist.  Patient said that he had blurred vision, but vision is better than every now.

## 2018-02-11 NOTE — Anesthesia Preprocedure Evaluation (Addendum)
Anesthesia Evaluation  Patient identified by MRN, date of birth, ID band Patient awake    Reviewed: Allergy & Precautions, NPO status , Patient's Chart, lab work & pertinent test results  History of Anesthesia Complications Negative for: history of anesthetic complications  Airway Mallampati: II  TM Distance: >3 FB Neck ROM: Full    Dental  (+) Dental Advisory Given, Upper Dentures   Pulmonary asthma , sleep apnea , former smoker,    breath sounds clear to auscultation       Cardiovascular hypertension, Pt. on medications  Rhythm:Regular Rate:Normal     Neuro/Psych  Myasthenia gravis   Neuromuscular disease negative psych ROS   GI/Hepatic Neg liver ROS, hiatal hernia, GERD  Medicated and Controlled, Esophageal cancer    Endo/Other   Obesity   Renal/GU negative Renal ROS  negative genitourinary   Musculoskeletal negative musculoskeletal ROS (+)   Abdominal   Peds  Hematology negative hematology ROS (+)   Anesthesia Other Findings   Reproductive/Obstetrics                            Anesthesia Physical Anesthesia Plan  ASA: III  Anesthesia Plan: MAC   Post-op Pain Management:    Induction: Intravenous  PONV Risk Score and Plan: 1 and Propofol infusion and Treatment may vary due to age or medical condition  Airway Management Planned: Nasal Cannula and Natural Airway  Additional Equipment: None  Intra-op Plan:   Post-operative Plan:   Informed Consent: I have reviewed the patients History and Physical, chart, labs and discussed the procedure including the risks, benefits and alternatives for the proposed anesthesia with the patient or authorized representative who has indicated his/her understanding and acceptance.     Plan Discussed with: CRNA and Anesthesiologist  Anesthesia Plan Comments:        Anesthesia Quick Evaluation

## 2018-02-12 ENCOUNTER — Encounter (HOSPITAL_COMMUNITY): Payer: Self-pay | Admitting: *Deleted

## 2018-02-12 ENCOUNTER — Ambulatory Visit (HOSPITAL_COMMUNITY): Payer: Medicare Other | Admitting: Anesthesiology

## 2018-02-12 ENCOUNTER — Ambulatory Visit (HOSPITAL_COMMUNITY)
Admission: RE | Admit: 2018-02-12 | Discharge: 2018-02-12 | Disposition: A | Discharge status: Home / Self Care | Payer: Medicare Other | Source: Ambulatory Visit | Attending: Gastroenterology | Admitting: Gastroenterology

## 2018-02-12 ENCOUNTER — Encounter (HOSPITAL_COMMUNITY)
Admission: RE | Disposition: A | Discharge status: Home / Self Care | Payer: Self-pay | Source: Ambulatory Visit | Attending: Gastroenterology

## 2018-02-12 DIAGNOSIS — Z6839 Body mass index (BMI) 39.0-39.9, adult: Secondary | ICD-10-CM | POA: Insufficient documentation

## 2018-02-12 DIAGNOSIS — K449 Diaphragmatic hernia without obstruction or gangrene: Secondary | ICD-10-CM | POA: Diagnosis not present

## 2018-02-12 DIAGNOSIS — E669 Obesity, unspecified: Secondary | ICD-10-CM | POA: Diagnosis not present

## 2018-02-12 DIAGNOSIS — K3189 Other diseases of stomach and duodenum: Secondary | ICD-10-CM | POA: Insufficient documentation

## 2018-02-12 DIAGNOSIS — Z86010 Personal history of colonic polyps: Secondary | ICD-10-CM | POA: Diagnosis not present

## 2018-02-12 DIAGNOSIS — C159 Malignant neoplasm of esophagus, unspecified: Secondary | ICD-10-CM | POA: Insufficient documentation

## 2018-02-12 DIAGNOSIS — G7000 Myasthenia gravis without (acute) exacerbation: Secondary | ICD-10-CM | POA: Diagnosis not present

## 2018-02-12 DIAGNOSIS — J45909 Unspecified asthma, uncomplicated: Secondary | ICD-10-CM | POA: Diagnosis not present

## 2018-02-12 DIAGNOSIS — G4730 Sleep apnea, unspecified: Secondary | ICD-10-CM | POA: Diagnosis not present

## 2018-02-12 DIAGNOSIS — K2210 Ulcer of esophagus without bleeding: Secondary | ICD-10-CM | POA: Diagnosis not present

## 2018-02-12 DIAGNOSIS — K228 Other specified diseases of esophagus: Secondary | ICD-10-CM | POA: Insufficient documentation

## 2018-02-12 DIAGNOSIS — Z87891 Personal history of nicotine dependence: Secondary | ICD-10-CM | POA: Diagnosis not present

## 2018-02-12 DIAGNOSIS — I10 Essential (primary) hypertension: Secondary | ICD-10-CM | POA: Insufficient documentation

## 2018-02-12 HISTORY — PX: EUS: SHX5427

## 2018-02-12 HISTORY — DX: Anemia, unspecified: D64.9

## 2018-02-12 HISTORY — DX: Malignant (primary) neoplasm, unspecified: C80.1

## 2018-02-12 HISTORY — PX: ESOPHAGOGASTRODUODENOSCOPY (EGD) WITH PROPOFOL: SHX5813

## 2018-02-12 SURGERY — UPPER ENDOSCOPIC ULTRASOUND (EUS) RADIAL
Anesthesia: Monitor Anesthesia Care

## 2018-02-12 MED ORDER — LIDOCAINE 2% (20 MG/ML) 5 ML SYRINGE
INTRAMUSCULAR | Status: DC | PRN
  Administered 2018-02-12: 100 mg via INTRAVENOUS

## 2018-02-12 MED ORDER — PROPOFOL 500 MG/50ML IV EMUL
INTRAVENOUS | Status: DC | PRN
  Administered 2018-02-12: 70 ug/kg/min via INTRAVENOUS

## 2018-02-12 MED ORDER — SODIUM CHLORIDE 0.9 % IV SOLN: INTRAVENOUS | Status: DC

## 2018-02-12 MED ORDER — LACTATED RINGERS IV SOLN
INTRAVENOUS | Status: DC | PRN
  Administered 2018-02-12: 08:00:00 via INTRAVENOUS

## 2018-02-12 MED ORDER — PROPOFOL 10 MG/ML IV BOLUS
INTRAVENOUS | Status: DC | PRN
  Administered 2018-02-12: 50 mg via INTRAVENOUS
  Administered 2018-02-12: 20 mg via INTRAVENOUS
  Administered 2018-02-12: 10 mg via INTRAVENOUS
  Administered 2018-02-12: 20 mg via INTRAVENOUS
  Administered 2018-02-12: 30 mg via INTRAVENOUS
  Administered 2018-02-12 (×2): 10 mg via INTRAVENOUS

## 2018-02-12 NOTE — H&P (Signed)
GASTROENTEROLOGY OUTPATIENT PROCEDURE H&P NOTE   Primary Care Physician: [name], MD  HPI: Philip [name] is a 72 y.o. male who presents for an outpatient EUS to evaluate for staging newly diagnosed esophageal cancer.  Past Medical History:  Diagnosis Date  . Abdominal hernia   . Anemia   . Asthma   . Barrett's esophagus   . Cancer (HCC)    Esophagel cancer  . Cataract    Bil surgery  . Cough    over 2 weeks  . Fluid retention in legs   . GERD (gastroesophageal reflux disease)   . H/O hiatal hernia   . Hypertension   . Myasthenia gravis ([place])   . Sleep apnea    don't use c-pap at home/ tried for several years  . SOB (shortness of breath)    Past Surgical History:  Procedure Laterality Date  . BALLOON DILATION N/A 10/19/2016   Procedure: BALLOON DILATION;  Surgeon: Ladene Artist, MD;  Location: [facility];  Service: Endoscopy;  Laterality: N/A;  . COLONOSCOPY W/ POLYPECTOMY    . ESOPHAGOGASTRODUODENOSCOPY N/A 10/19/2016   Procedure: ESOPHAGOGASTRODUODENOSCOPY (EGD);  Surgeon: Ladene Artist, MD;  Location: [facility];  Service: Endoscopy;  Laterality: N/A;  . EYE SURGERY Bilateral    cataract  . LUMBAR LAMINECTOMY     1980's   Current Facility-Administered Medications  Medication Dose Route Frequency Provider Last Rate Last Dose  . 0.9 %  sodium chloride infusion   Intravenous Continuous Mansouraty, [name], MD       No Known Allergies Family History  Problem Relation Age of Onset  . Heart disease Mother   . Lung cancer Father   . Colon cancer Neg Hx    Social History   Socioeconomic History  . Marital status: Single    Spouse name: Not on file  . Number of children: 0  . Years of education: 65  . Highest education level: Not on file  Occupational History    Comment: retired  Scientific laboratory technician  . Financial resource strain: Not on file  . Food insecurity:    Worry: Not on file    Inability: Not on file  . Transportation needs:   Medical: Not on file    Non-medical: Not on file  Tobacco Use  . Smoking status: Former Smoker    Packs/day: 1.00    Years: 7.00    Pack years: 7.00    Last attempt to quit: 12/21/1976    Years since quitting: 41.1  . Smokeless tobacco: Never Used  Substance and Sexual Activity  . Alcohol use: Not Currently    Comment: ocassional  . Drug use: No  . Sexual activity: Not on file  Lifestyle  . Physical activity:    Days per week: Not on file    Minutes per session: Not on file  . Stress: Not on file  Relationships  . Social connections:    Talks on phone: Not on file    Gets together: Not on file    Attends religious service: Not on file    Active member of club or organization: Not on file    Attends meetings of clubs or organizations: Not on file    Relationship status: Not on file  . Intimate partner violence:    Fear of current or ex partner: Not on file    Emotionally abused: Not on file    Physically abused: Not on file    Forced sexual activity: Not  on file  Other Topics Concern  . Not on file  Social History Narrative   Lives alone   Caffeine- coffee, tea, sodas 1/2 gallon total daily    Physical Exam: Vital signs in last 24 hours: Temp:  [99 F (37.2 C)] 99 F (37.2 C) (10/14 0627) Pulse Rate:  [84] 84 (10/14 0627) Resp:  [21] 21 (10/14 0627) BP: (146)/(78) 146/78 (10/14 0627) SpO2:  [95 %] 95 % (10/14 0627)   GEN: NAD EYE: Sclerae anicteric ENT: MMM CV: RR without R/Gs  RESP: CTAB posteriorly GI: Soft, NT/ND NEURO:  Alert & Oriented x 3  Lab Results: No results for input(s): WBC, HGB, HCT, PLT in the last 72 hours. BMET No results for input(s): NA, K, CL, CO2, GLUCOSE, BUN, CREATININE, CALCIUM in the last 72 hours. LFT No results for input(s): PROT, ALBUMIN, AST, ALT, ALKPHOS, BILITOT, BILIDIR, IBILI in the last 72 hours. PT/INR No results for input(s): LABPROT, INR in the last 72 hours.   Impression / Plan: This is a 72 y.o.male who presents  for EUS for staging of newly diagnosed esophageal cancer.  The risks of EUS including bleeding, infection, aspiration pneumonia and intestinal perforation were discussed as was the possibility it may not give a definitive diagnosis.  If a biopsy of the pancreas is done as part of the EUS, there is an additional risk of pancreatitis at the rate of about 1%.  It was explained that procedure related pancreatitis is typically mild, although can be severe and even life threatening, which is why we do not perform random pancreatic biopsies and only biopsy a lesion we feel is concerning enough to warrant the risk.   The risks and benefits of endoscopic evaluation were discussed with the patient; these include but are not limited to the risk of perforation, infection, bleeding, missed lesions, lack of diagnosis, severe illness requiring hospitalization, as well as anesthesia and sedation related illnesses.  The patient is agreeable to proceed.    Justice Britain, MD [facility] Office # [phone]

## 2018-02-12 NOTE — Discharge Instructions (Signed)
YOU HAD AN ENDOSCOPIC PROCEDURE TODAY: Refer to the procedure report and other information in the discharge instructions given to you for any specific questions about what was found during the examination. If this information does not answer your questions, please call [facility] office at [phone] to clarify.  ° °YOU SHOULD EXPECT: Some feelings of bloating in the abdomen. Passage of more gas than usual. Walking can help get rid of the air that was put into your GI tract during the procedure and reduce the bloating. If you had a lower endoscopy (such as a colonoscopy or flexible sigmoidoscopy) you may notice spotting of blood in your stool or on the toilet paper. Some abdominal soreness may be present for a day or two, also. ° °DIET: Your first meal following the procedure should be a light meal and then it is ok to progress to your normal diet. A half-sandwich or bowl of soup is an example of a good first meal. Heavy or fried foods are harder to digest and may make you feel nauseous or bloated. Drink plenty of fluids but you should avoid alcoholic beverages for 24 hours. If you had a esophageal dilation, please see attached instructions for diet.   ° °ACTIVITY: Your care partner should take you home directly after the procedure. You should plan to take it easy, moving slowly for the rest of the day. You can resume normal activity the day after the procedure however YOU SHOULD NOT DRIVE, use power tools, machinery or perform tasks that involve climbing or major physical exertion for 24 hours (because of the sedation medicines used during the test).  ° °SYMPTOMS TO REPORT IMMEDIATELY: °A gastroenterologist can be reached at any hour. Please call [phone]  for any of the following symptoms:  °Following lower endoscopy (colonoscopy, flexible sigmoidoscopy) °Excessive amounts of blood in the stool  °Significant tenderness, worsening of abdominal pains  °Swelling of the abdomen that is new, acute  °Fever of 100° or  higher  °Following upper endoscopy (EGD, EUS, ERCP, esophageal dilation) °Vomiting of blood or coffee ground material  °New, significant abdominal pain  °New, significant chest pain or pain under the shoulder blades  °Painful or persistently difficult swallowing  °New shortness of breath  °Black, tarry-looking or red, bloody stools ° °FOLLOW UP:  °If any biopsies were taken you will be contacted by phone or by letter within the next 1-3 weeks. Call [phone]  if you have not heard about the biopsies in 3 weeks.  °Please also call with any specific questions about appointments or follow up tests. ° °

## 2018-02-12 NOTE — Transfer of Care (Signed)
Immediate Anesthesia Transfer of Care Note  Patient: Tevan Marian Butkus  Procedure(s) Performed: UPPER ENDOSCOPIC ULTRASOUND (EUS) RADIAL (N/A ) ESOPHAGOGASTRODUODENOSCOPY (EGD) WITH PROPOFOL (N/A )  Patient Location: PACU and Endoscopy Unit  Anesthesia Type:MAC  Level of Consciousness: awake, drowsy and patient cooperative  Airway & Oxygen Therapy: Patient Spontanous Breathing and Patient connected to nasal cannula oxygen  Post-op Assessment: Report given to RN, Post -op Vital signs reviewed and stable and Patient moving all extremities X 4  Post vital signs: Reviewed and stable  Last Vitals:  Vitals Value Taken Time  BP 110/55 02/12/2018  8:26 AM  Temp    Pulse 85 02/12/2018  8:26 AM  Resp 20 02/12/2018  8:26 AM  SpO2 97 % 02/12/2018  8:26 AM  Vitals shown include unvalidated device data.  Last Pain:  Vitals:   02/12/18 0826  TempSrc: Oral  PainSc:       Patients Stated Pain Goal: 2 (06/01/41 8887)  Complications: No apparent anesthesia complications

## 2018-02-12 NOTE — Op Note (Signed)
[facility] Patient Name: Philip [name] Procedure Date : 02/12/2018 MRN: [id number] Attending MD: Justice Britain , MD Date of Birth: 1945-05-19 CSN: [id number] Age: 72 Admit Type: Outpatient Procedure:                Upper EUS Indications:              Esophageal mucosal mass/polyp found on endoscopy,                            Staging of esophageal adenocarcinoma, Normal CT-CAP                            but PET-CT not completed Providers:                Justice Britain, MD, Carlyn Reichert, RN, Elspeth Cho Tech., Technician Referring MD:             Truitt Merle, [place]ar Freddi Che Loletha Carrow, MD Medicines:                Monitored Anesthesia Care Complications:            No immediate complications. Estimated Blood Loss:     Estimated blood loss: none. Procedure:                Pre-Anesthesia Assessment:                           - Prior to the procedure, a History and Physical                            was performed, and patient medications and                            allergies were reviewed. The patient's tolerance of                            previous anesthesia was also reviewed. The risks                            and benefits of the procedure and the sedation                            options and risks were discussed with the patient.                            All questions were answered, and informed consent                            was obtained. Prior Anticoagulants: The patient has                            taken aspirin. ASA Grade Assessment: II - A patient  with mild systemic disease. After reviewing the                            risks and benefits, the patient was deemed in                            satisfactory condition to undergo the procedure.                           After obtaining informed consent, the endoscope was                            passed under direct vision.  Throughout the                            procedure, the patient's blood pressure, pulse, and                            oxygen saturations were monitored continuously. The                            Endoscope was introduced through the mouth, and                            advanced to the second part of duodenum. The                            [id number] ([id number]) Olympus Radial EUS was                            introduced through the mouth, and advanced to the                            stomach for ultrasound examination from the                            esophagus and stomach. The upper EUS was                            accomplished without difficulty. The patient                            tolerated the procedure. Scope In: Scope Out: Findings:      ENDOSCOPIC FINDING: :      No gross lesions were noted in the proximal esophagus and in the mid       esophagus.      Localized severe mucosal changes characterized by friability (with       spontaneous bleeding), granularity, inflammation and ulceration were       found in the distal esophagus extending to the gastroesophageal junction       and into the proximal cardia. This began at 44.5 cm and extended into       the cardia at 46.5 cm.      Esophagogastric landmarks were identified: the Z-line was found at 45  cm       and the upper extent of the gastric folds was found at 46 cm from the       incisors.      Patchy nodular mucosa was found in the gastric antrum.      No other gross lesions were noted in the entire examined stomach.      A single 8 mm submucosal nodule was found in the second portion of the       duodenum.      ENDOSONOGRAPHIC FINDING: :      A hypoechoic mass was found in the distal espohagus/gastroesophageal       junction. The mass was encountered at 44 cm from the incisors and       extended to 46 cm. The lesion was partially circumferential (involving       25% of the lumen). The endosonographic borders  were irregular. The mass       measured up to 19 mm in thickness. There was sonographic evidence       suggesting invasion into the luminal interface/superficial mucosa (Layer       1), the deep mucosa (Layer 2), the submucosa (Layer 3) and the       muscularis propria (Layer 4).      No malignant-appearing lymph nodes were visualized in the anterior       mediastinum (level 6), subcarinal mediastinum (level 7), middle       paraesophageal mediastinum (level 11M), lower paraesophageal mediastinum       (level 8L), diaphragmatic region (level 15), mediastinal periaortic       region, paracardial region (level 16), left gastric region (level 17)       and gastrohepatic ligament (level 18).      Endosonographic imaging in the visualized portion of the liver showed no       mass.      The celiac region was visualized. Impression:               EGD Impression:                           - No gross lesions in proximal/middle esophagus.                           - Friable (with spontaneous bleeding), granular,                            inflamed, ulcerated mucosa in the esophagus -                            consistent with previously noted biopsies with                            adenocarcinoma at the distal                            esophagus/GEJxn/proximal cardia.                           - Nodular mucosa in the gastric antrum.                           -  No other gross lesions in the stomach.                           - Submucosal nodule found in the duodenum.                           EUS Impression:                           - A mass was found in the distal esophagus into the                            gastroesophageal junction and extending to proximal                            cardia. A tissue diagnosis was obtained prior to                            this exam consistent with adenocarcinoma. This was                            staged at T2N0Mx, because of a loss of interface of                             the muscularis propria as the distal GE Junction                            becomes the cardia and the mass moves into this                            region, but there are many more regions and images                            that suggest a T1 lesion into the submucosa.                           - No malignant-appearing lymph nodes were                            visualized in the anterior mediastinum (level 6),                            subcarinal mediastinum (level 7), middle                            paraesophageal mediastinum (level 40M), lower                            paraesophageal mediastinum (level 8L),                            diaphragmatic region (level 15), mediastinal  periaortic region, paracardial region (level 16),                            left gastric region (level 17) and gastrohepatic                            ligament (level 18). Recommendation:           - The patient will be observed post-procedure,                            until all discharge criteria are met.                           - Discharge patient to home.                           - Patient has a contact number available for                            emergencies. The signs and symptoms of potential                            delayed complications were discussed with the                            patient. Return to normal activities tomorrow.                            Written discharge instructions were provided to the                            patient.                           - Observe patient's clinical course.                           - Follow up with Surgery and Oncology for next                            steps in Diagnotic and Therapeutic options.                           - The findings and recommendations were discussed                            with the patient.                           - The findings and recommendations were  discussed                            with the designated responsible adult. Procedure Code(s):        --- Professional ---  80321, Esophagogastroduodenoscopy, flexible,                            transoral; with endoscopic ultrasound examination                            limited to the esophagus, stomach or duodenum, and                            adjacent structures Diagnosis Code(s):        --- Professional ---                           K22.8, Other specified diseases of esophagus                           K20.9, Esophagitis, unspecified                           K22.10, Ulcer of esophagus without bleeding                           K31.89, Other diseases of stomach and duodenum                           I89.9, Noninfective disorder of lymphatic vessels                            and lymph nodes, unspecified                           C15.9, Malignant neoplasm of esophagus, unspecified CPT copyright 2018 [employer]. All rights reserved. The codes documented in this report are preliminary and upon coder review may  be revised to meet current compliance requirements. Justice Britain, MD 02/12/2018 8:50:08 AM Number of Addenda: 0

## 2018-02-12 NOTE — Anesthesia Postprocedure Evaluation (Signed)
Anesthesia Post Note  Patient: Philip [name]  Procedure(s) Performed: UPPER ENDOSCOPIC ULTRASOUND (EUS) RADIAL (N/A ) ESOPHAGOGASTRODUODENOSCOPY (EGD) WITH PROPOFOL (N/A )     Patient location during evaluation: PACU Anesthesia Type: MAC Level of consciousness: awake and alert Pain management: pain level controlled Vital Signs Assessment: post-procedure vital signs reviewed and stable Respiratory status: spontaneous breathing, nonlabored ventilation and respiratory function stable Cardiovascular status: stable and blood pressure returned to baseline Anesthetic complications: no    Last Vitals:  Vitals:   02/12/18 0835 02/12/18 0845  BP: 137/86 135/80  Pulse: 83 72  Resp: 18 17  Temp:    SpO2: 100% 100%    Last Pain:  Vitals:   02/12/18 0845  TempSrc:   PainSc: 0-No pain                 Audry Pili

## 2018-02-12 NOTE — Anesthesia Procedure Notes (Signed)
Procedure Name: MAC Date/Time: 02/12/2018 7:40 AM Performed by: Renato Shin, CRNA Pre-anesthesia Checklist: Patient identified, Emergency Drugs available, Suction available and Patient being monitored Patient Re-evaluated:Patient Re-evaluated prior to induction Oxygen Delivery Method: Nasal cannula Preoxygenation: Pre-oxygenation with 100% oxygen Induction Type: IV induction Placement Confirmation: positive ETCO2,  CO2 detector and breath sounds checked- equal and bilateral Dental Injury: Teeth and Oropharynx as per pre-operative assessment

## 2018-02-13 ENCOUNTER — Encounter (HOSPITAL_COMMUNITY): Payer: Self-pay | Admitting: Gastroenterology

## 2018-02-14 NOTE — Progress Notes (Signed)
[name]       [place] [address]             [facility] Record #[id number] Date of Birth: 07/17/1945  Referring: [name], NP Primary Care: [name], MD Primary Cardiologist: No primary care provider on file.  Chief Complaint:    Chief Complaint  Patient presents with  . New Patient (Initial Visit)    mass gastroesophageal junction, EUS10/14/19, upper GI 01/22/18, CT chest/abd/pelvis 02/05/2018    History of Present Illness:    Philip [name] 72 y.o. 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  Patient has had a known history of Barrett's esophagus from previous endoscopies follow-up endoscopy done recently led to a positive biopsy for adenocarcinoma.  Subsequent EUS was performed.  Patient was seen by medical oncology and referred for surgical evaluation. He is a distant smoker briefly and quit at the age of 56.   In office today it appears that he is somewhat limited in his physical ability he notes that he is unable to climb stairs without getting short of breath he has no known cardiac history.  Currently on CellCept  for myasthenia gravis.    Followed by Neurology for recently diagnosed myasthenia gravis-January 2019 UPDATE (12/04/17, VRP): Since last visit, doing well. Symptoms are resolved. Tolerating cellcept and mestinon. No more double vision. No generalized fatigue.   UPDATE (03/13/17, VRP): Since last visit, doing well. IVIG helped quite a bit. Tolerating pyridostigmine. No alleviating or aggravating factors. No generalized weakness but notes some non-specific "tired" feeling. No double vision or drooping eyelids.   UPDATE (01/17/17, VRP): Since last visit, doing well on pyridostigmine. It worked the next day. AchR ab are positive. Some generalized fatigue and weakness are noted and new since last visit. Went to ER  for laryngospasm (?upper resp infx) and tx'd with 4 days prednisone and oral lidocaine.    PRIOR HPI (12/21/16): 72 year old male here for evaluation of double vision. Her past to 3 weeks patient has had intermittent double vision.  Symptoms present with both eyes open but improve if he closes one eye or uses an eye patch. He describes objects as being up and to the side from each other.  They slightly overlap.  This fluctuates.  Symptoms worse later in the day.  He also had some swallowing difficulties for the past 2 months without a specific cause to be found. He has some generalized fatigue as well. Patient went to [facility] eye clinic, with no specific ocular pathology found.  Patient was referred here for consideration of possible neuromuscular, neuro inflammatory or neuro vascular cause of symptoms. No unilateral numbness or weakness.  No sudden strokelike symptoms.  No headaches.  No prodromal factors.     Current Activity/ Functional Status:  Patient is independent with mobility/ambulation, transfers, ADL's, IADL's.   Zubrod Score: At the time of surgery this patient's most appropriate activity status/level should be described as: []     0    Normal activity, no symptoms [x]     1    Restricted in physical strenuous activity but ambulatory, able to do out light work []     2    Ambulatory and capable of self care, unable to do work activities,  up and about               >50 % of waking hours                              []     3    Only limited self care, in bed greater than 50% of waking hours []     4    Completely disabled, no self care, confined to bed or chair []     5    Moribund   Past Medical History:  Diagnosis Date  . Abdominal hernia   . Anemia   . Asthma   . Barrett's esophagus   . Cancer (HCC)    Esophagel cancer  . Cataract    Bil surgery  . Cough    over 2 weeks  . Fluid retention in legs   . GERD (gastroesophageal reflux disease)   . H/O hiatal hernia   .  Hypertension   . Myasthenia gravis ([place])   . Sleep apnea    don't use c-pap at home/ tried for several years  . SOB (shortness of breath)     Past Surgical History:  Procedure Laterality Date  . BALLOON DILATION N/A 10/19/2016   Procedure: BALLOON DILATION;  Surgeon: Ladene Artist, MD;  Location: [facility];  Service: Endoscopy;  Laterality: N/A;  . COLONOSCOPY W/ POLYPECTOMY    . ESOPHAGOGASTRODUODENOSCOPY N/A 10/19/2016   Procedure: ESOPHAGOGASTRODUODENOSCOPY (EGD);  Surgeon: Ladene Artist, MD;  Location: [facility];  Service: Endoscopy;  Laterality: N/A;  . ESOPHAGOGASTRODUODENOSCOPY (EGD) WITH PROPOFOL N/A 02/12/2018   Procedure: ESOPHAGOGASTRODUODENOSCOPY (EGD) WITH PROPOFOL;  Surgeon: [name], MD;  Location: [facility];  Service: Gastroenterology;  Laterality: N/A;  . EUS N/A 02/12/2018   Procedure: UPPER ENDOSCOPIC ULTRASOUND (EUS) RADIAL;  Surgeon: [name], MD;  Location: [facility];  Service: Gastroenterology;  Laterality: N/A;  . EYE SURGERY Bilateral    cataract  . LUMBAR LAMINECTOMY     1980's    Family History  Problem Relation Age of Onset  . Heart disease Mother   . Lung cancer Father   . Colon cancer Neg Hx      Social History   Tobacco Use  Smoking Status Former Smoker  . Packs/day: 1.00  . Years: 7.00  . Pack years: 7.00  . Last attempt to quit: 12/21/1976  . Years since quitting: 41.1  Smokeless Tobacco Never Used    Social History   Substance and Sexual Activity  Alcohol Use Not Currently   Comment: ocassional     No Known Allergies  Current Outpatient Medications  Medication Sig Dispense Refill  . ALBUTEROL IN Inhale 2 puffs into the lungs every 4 (four) hours as needed (shortness of breath or wheezing).     [name] amitriptyline (ELAVIL) 25 MG tablet Take 25 mg by mouth daily.     [name] aspirin EC 81 MG tablet Take 81 mg by mouth daily.    [name] atorvastatin (LIPITOR) 10 MG tablet Take 10 mg by mouth at  bedtime.     . diclofenac (VOLTAREN) 75 MG EC tablet Take 75 mg by mouth daily as needed.     . Esomeprazole Magnesium 20 MG TBEC Take 40 mg by mouth daily at 12 noon.     [name] lisinopril (PRINIVIL,ZESTRIL) 20 MG tablet Take 20 mg by mouth daily.    . Multiple Vitamin (MULTIVITAMIN) tablet Take 1 tablet by  mouth daily.    . mycophenolate (CELLCEPT) 500 MG tablet Take 2 tablets (1,000 mg total) by mouth 2 (two) times daily. 120 tablet 12  . traMADol (ULTRAM) 50 MG tablet Take 50 mg by mouth every 6 (six) hours as needed for pain.     No current facility-administered medications for this visit.     Pertinent items are noted in HPI.   Review of Systems:     Cardiac Review of Systems: [Y] = yes  or   [ N ] = no   Chest Pain [ n  ]  Resting SOB [ y ] Exertional SOB  [ y ]  Vertell Limber Florencio.Farrier  ]   Pedal Edema [ y  ]    Palpitations [ n ] Syncope  [n  ]   Presyncope [ n  ]   General Review of Systems: [Y] = yes [  ]=no Constitional: recent weight change [n  ];  Wt loss over the last 3 months [   ] anorexia [  ]; fatigue [  ]; nausea [  ]; night sweats [  ]; fever [  ]; or chills [  ];           Eye : blurred vision [  ]; diplopia [   ]; vision changes [  ];  Amaurosis fugax[  ]; Resp: cough [  ];  wheezing[  ];  hemoptysis[  ]; shortness of breath[  ]; paroxysmal nocturnal dyspnea[  ]; dyspnea on exertion[y ]; or orthopnea[  ];  GI:  gallstones[  ], vomiting[  ];  dysphagia[  ]; melena[  ];  hematochezia [  ]; heartburn[  ];   Hx of  Colonoscopy[  ]; GU: kidney stones [  ]; hematuria[  ];   dysuria [  ];  nocturia[  ];  history of     obstruction [  ]; urinary frequency [  ]             Skin: rash, swelling[  ];, hair loss[  ];  peripheral edema[  ];  or itching[  ]; Musculosketetal: myalgias[  ];  joint swelling[  ];  joint erythema[  ];  joint pain[  ];  back pain[  ];  Heme/Lymph: bruising[  ];  bleeding[  ];  anemia[  ];  Neuro: TIA[  ];  headaches[  ];  stroke[  ];  vertigo[  ];  seizures[  ];    paresthesias[  ];  difficulty walking[ y ]; double vision   Psych:depression[  ]; anxiety[  ];  Endocrine: diabetes[  ];  thyroid dysfunction[  ];  Immunizations: Flu up to date [  ]; Pneumococcal up to date [  ];  Other:     PHYSICAL EXAMINATION: BP 138/80   Pulse 90   Resp 20   Ht 6\' 3"  (1.905 m)   Wt 299 lb (135.6 kg)   SpO2 95% Comment: RA  BMI 37.37 kg/m  General appearance: alert, cooperative, appears older than stated age and moderately obese Head: Normocephalic, without obvious abnormality, atraumatic Neck: no adenopathy, no carotid bruit, no JVD, supple, symmetrical, trachea midline and thyroid not enlarged, symmetric, no tenderness/mass/nodules Lymph nodes: Cervical, supraclavicular, and axillary nodes normal. Resp: diminished breath sounds bibasilar Back: symmetric, no curvature. ROM normal. No CVA tenderness. Cardio: regular rate and rhythm, S1, S2 normal, no murmur, click, rub or gallop GI: soft, non-tender; bowel sounds normal; no masses,  no organomegaly Extremities: extremities normal, atraumatic, no cyanosis  or edema and Homans sign is negative, no sign of DVT Neurologic: Grossly normal  Diagnostic Studies & Laboratory data:     Recent Radiology Findings:     Ct chest  Abdomen Pelvis W Contrast  Result Date: 02/06/2018 CLINICAL DATA:  Esophageal adenocarcinoma. Biopsy on 01/22/2018 demonstrating poorly differentiated adenocarcinoma at GE junction. EXAM: CT CHEST, ABDOMEN, AND PELVIS WITH CONTRAST TECHNIQUE: Multidetector CT imaging of the chest, abdomen and pelvis was performed following the standard protocol during bolus administration of intravenous contrast. CONTRAST:  126mL ISOVUE-300 IOPAMIDOL (ISOVUE-300) INJECTION 61% COMPARISON:  Chest CT 01/19/2017.  No prior abdominopelvic CTs. FINDINGS: CT CHEST FINDINGS Cardiovascular: Bovine arch. Normal heart size, without pericardial effusion. Lipomatous hypertrophy of the interatrial septum. No central pulmonary  embolism, on this non-dedicated study. Mediastinum/Nodes: No supraclavicular adenopathy. No mediastinal or hilar adenopathy. Normal appearance of the esophagus. Lungs/Pleura: No pleural fluid. The previously described 4 mm lingular nodule is no longer identified. Musculoskeletal: No acute osseous abnormality. CT ABDOMEN PELVIS FINDINGS Hepatobiliary: Normal liver. Normal gallbladder, without biliary ductal dilatation. Pancreas: Normal, without mass or ductal dilatation. Spleen: Normal in size, without focal abnormality. Adrenals/Urinary Tract: Normal adrenal glands. Normal kidneys, without hydronephrosis. Normal urinary bladder. Stomach/Bowel: Normal stomach, without wall thickening. Scattered colonic diverticula. Normal terminal ileum. Nonobstructive 12 mm lipoma within the inferior genu of the duodenum on coronal image 92 and transverse image 80/2. Otherwise normal small bowel. Vascular/Lymphatic: Aortic atherosclerosis. No abdominopelvic adenopathy. Reproductive: Normal prostate. Other: No significant free fluid. A tiny fat containing left inguinal hernia. No evidence of omental or peritoneal disease. Musculoskeletal: No acute osseous abnormality. Thoracolumbar spondylosis. IMPRESSION: 1. No esophageal primary identified. No findings of metastatic disease in the chest, abdomen, or pelvis. 2.  Aortic Atherosclerosis (ICD10-I70.0). Electronically Signed   By: Abigail Miyamoto M.D.   On: 02/06/2018 11:13     I have independently reviewed the above radiology studies  and reviewed the findings with the patient.   Recent Lab Findings: Lab Results  Component Value Date   WBC 6.8 12/04/2017   HGB 12.9 (L) 12/04/2017   HCT 38.0 12/04/2017   PLT 217 12/04/2017   GLUCOSE 97 12/04/2017   ALT 17 12/04/2017   AST 16 12/04/2017   NA 138 12/04/2017   K 4.6 12/04/2017   CL 103 12/04/2017   CREATININE 0.87 01/31/2018   BUN 17 01/31/2018   CO2 21 12/04/2017   TSH 2.040 12/21/2016   HGBA1C 5.4 12/21/2016   EGD: There were esophageal mucosal changes secondary to established short-segment Barrett's disease present at the gastroesophageal junction. The maximum longitudinal extent of these mucosal changes was 1 cm in length. Findings: - A single small, inflamed mucosal nodule was found at the gastroesophageal junction within the Barrett's esophagus. Biopsies were taken with a cold forceps for histology. - Patchy mildly erythematous mucosa without bleeding was found in the prepyloric region of the stomach. - The cardia and gastric fundus were normal on retroflexion. - A single 12 mm submucosal nodule was found in the second portion of the duodenum. - The exam of the duodenum was otherwise normal.  Esophageal mucosal changes secondary to established short-segment Barrett's disease. - Mucosal nodule found in the esophagus. Biopsied. - Erythematous mucosa in the prepyloric region of the stomach. - Submucosal nodule found in the duodenum. Impression: - Patient has a contact number available for emergencies. The signs and symptoms of potential delayed complications were discussed with the patient. Return to normal activities tomorrow. Written discharge instructions were provided to the patient. -  Resume previous diet. - Continue present medications. - Await pathology results. - See the other procedure note for documentation of additional recommendations.     EUS: by Dr Justice Britain  EGD Impression: - No gross lesions in proximal/middle esophagus. - Friable (with spontaneous bleeding), granular, inflamed, ulcerated mucosa in the esophagus - consistent with previously noted biopsies with adenocarcinoma at the distal esophagus/GEJxn/proximal cardia. - Nodular mucosa in the gastric antrum. - No other gross lesions in the stomach. - Submucosal nodule found in the duodenum. EUS Impression: - A mass was found in the distal esophagus into the gastroesophageal junction and extending to  proximal cardia. A tissue diagnosis was obtained prior to this exam consistent with adenocarcinoma. This was staged at T2N0Mx, because of a loss of interface of the muscularis propria as the distal GE Junction becomes the cardia and the mass moves into this region, but there are many more regions and images that suggest a T1 lesion into the submucosa. - No malignant-appearing lymph nodes were visualized in the anterior mediastinum (level 6), subcarinal mediastinum (level 7), middle paraesophageal mediastinum (level 24M), lower paraesophageal mediastinum (level 8L), diaphragmatic region (level 15), mediastinal periaortic region, paracardial region (level 16), left gastric region (level 17) and gastrohepatic ligament (level 18). Impression: esophagus/GEJxn/proximal cardia. - Nodular mucosa in the gastric antrum. - No other gross lesions in the stomach. - Submucosal nodule found in the duodenum. EUS Impression: - A mass was found in the distal esophagus into the gastroesophageal junction and extending to proximal cardia. A tissue diagnosis was obtained prior to this exam consistent with adenocarcinoma. This was staged at T2N0Mx, because of a loss of interface of the muscularis propria as the distal GE Junction becomes the cardia and the mass moves into this region, but there are many more regions and images that suggest a T1 lesion into the submucosa. - No malignant-appearing lymph nodes were visualized in the anterior mediastinum (level 6), subcarinal mediastinum (level 7), middle paraesophageal mediastinum (level 24M), lower paraesophageal mediastinum (level 8L), diaphragmatic region (level 15), mediastinal periaortic region, paracardial region (level 16), left gastric region (level 17) and  Diagnosis [id number] [facility]   Surgical [P], GE junction nodule BX - POORLY 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  6 min walk  test- Walked 784 feet Borg 3 sat to 93%    Assessment / Plan:   Patient with short segment Barrett's  With small area of  T1 adenocarcinoma , question area of T2 invasion on EUS.  No evidence of disease on CT of chest or abdomen.  The  patient's pulmonary status, significant shortness of breath, history of myasthenia gravis all  making surgical resection carry an increase risk. I will  asked Dr. Rayne Du Shaheen/ [facility] to review the current studies and offer an option as to possible less invasive treatment for the patient. The patient is will to go to [facility] for evaluation.   Will forward copies of endo report , eus report and ct scan for review .      I  spent 60 minutes with  the patient face to face and greater then 50% of the time was spent in counseling and coordination of care.    Grace Isaac MD      [address] [place] [address] Office [phone]   Beeper [phone]  02/18/2018 4:58 PM

## 2018-02-15 ENCOUNTER — Other Ambulatory Visit: Payer: Self-pay

## 2018-02-15 ENCOUNTER — Encounter: Payer: Self-pay | Admitting: Cardiothoracic Surgery

## 2018-02-15 ENCOUNTER — Institutional Professional Consult (permissible substitution) (INDEPENDENT_AMBULATORY_CARE_PROVIDER_SITE_OTHER): Payer: Medicare Other | Admitting: Cardiothoracic Surgery

## 2018-02-15 ENCOUNTER — Other Ambulatory Visit: Payer: Self-pay | Admitting: *Deleted

## 2018-02-15 VITALS — BP 138/80 | HR 90 | Resp 20 | Ht 75.0 in | Wt 299.0 lb

## 2018-02-15 DIAGNOSIS — C155 Malignant neoplasm of lower third of esophagus: Secondary | ICD-10-CM

## 2018-02-15 DIAGNOSIS — C160 Malignant neoplasm of cardia: Secondary | ICD-10-CM

## 2018-02-15 DIAGNOSIS — Z01811 Encounter for preprocedural respiratory examination: Secondary | ICD-10-CM

## 2018-02-15 NOTE — Progress Notes (Signed)
6 Minute Walk Test Results  Patient: Philip [name] Date:  02/15/2018   Supplemental O2 during test? NO     End Baseline  Time  1532 1526     Heartrate 108 90     Dyspnea yes no     Fatigue yes no     O2 sat  93% 94%     Blood pressure 190/ 98  138/ 80       Patient ambulated at a slow/steady pace for a total distance of  784 feet with 0 stops.  Ambulation was limited primarily due to n/a  Overall the test was tolerated fairly well.  Some shortness of breath and fatigue.  Borg Scale- 0.5 baseline, 3 end

## 2018-02-20 ENCOUNTER — Ambulatory Visit (HOSPITAL_COMMUNITY)
Admission: RE | Admit: 2018-02-20 | Discharge: 2018-02-20 | Disposition: A | Discharge status: Home / Self Care | Payer: Medicare Other | Source: Ambulatory Visit | Attending: Cardiothoracic Surgery | Admitting: Cardiothoracic Surgery

## 2018-02-20 DIAGNOSIS — Z01811 Encounter for preprocedural respiratory examination: Secondary | ICD-10-CM

## 2018-02-20 DIAGNOSIS — C160 Malignant neoplasm of cardia: Secondary | ICD-10-CM | POA: Diagnosis present

## 2018-02-20 DIAGNOSIS — R942 Abnormal results of pulmonary function studies: Secondary | ICD-10-CM | POA: Diagnosis not present

## 2018-02-20 LAB — PULMONARY FUNCTION TEST
DL/VA % pred: 81 %
DL/VA: 3.85 ml/min/mmHg/L
DLCO unc % pred: 49 %
DLCO unc: 17.48 ml/min/mmHg
FEF 25-75 Post: 3.59 L/sec
FEF 25-75 Pre: 3.38 L/sec
FEF2575-%Change-Post: 6 %
FEF2575-%Pred-Post: 137 %
FEF2575-%Pred-Pre: 129 %
FEV1-%Change-Post: 0 %
FEV1-%Pred-Post: 79 %
FEV1-%Pred-Pre: 79 %
FEV1-Post: 2.75 L
FEV1-Pre: 2.77 L
FEV1FVC-%Change-Post: 4 %
FEV1FVC-%Pred-Pre: 115 %
FEV6-%Change-Post: -4 %
FEV6-%Pred-Post: 69 %
FEV6-%Pred-Pre: 72 %
FEV6-Post: 3.1 L
FEV6-Pre: 3.26 L
FEV6FVC-%Pred-Post: 105 %
FEV6FVC-%Pred-Pre: 105 %
FVC-%Change-Post: -4 %
FVC-%Pred-Post: 65 %
FVC-%Pred-Pre: 68 %
FVC-Post: 3.1 L
FVC-Pre: 3.26 L
Post FEV1/FVC ratio: 89 %
Post FEV6/FVC ratio: 100 %
Pre FEV1/FVC ratio: 85 %
Pre FEV6/FVC Ratio: 100 %
RV % pred: 76 %
RV: 1.99 L
TLC % pred: 70 %
TLC: 5.2 L

## 2018-02-20 MED ORDER — ALBUTEROL SULFATE (2.5 MG/3ML) 0.083% IN NEBU
2.5000 mg | INHALATION_SOLUTION | Freq: Once | RESPIRATORY_TRACT | Status: AC
  Administered 2018-02-20: 2.5 mg via RESPIRATORY_TRACT

## 2018-02-21 ENCOUNTER — Telehealth: Payer: Self-pay | Admitting: Nurse Practitioner

## 2018-02-21 NOTE — Telephone Encounter (Signed)
Patient called to reschedule  °

## 2018-02-22 ENCOUNTER — Encounter: Payer: Self-pay | Admitting: Internal Medicine

## 2018-02-22 ENCOUNTER — Inpatient Hospital Stay: Payer: Medicare Other

## 2018-02-22 ENCOUNTER — Ambulatory Visit (INDEPENDENT_AMBULATORY_CARE_PROVIDER_SITE_OTHER): Payer: Medicare Other | Admitting: Internal Medicine

## 2018-02-22 ENCOUNTER — Inpatient Hospital Stay: Payer: Medicare Other | Admitting: Nurse Practitioner

## 2018-02-22 VITALS — BP 120/58 | HR 88 | Ht 72.0 in | Wt 288.2 lb

## 2018-02-22 DIAGNOSIS — R9431 Abnormal electrocardiogram [ECG] [EKG]: Secondary | ICD-10-CM | POA: Diagnosis not present

## 2018-02-22 DIAGNOSIS — R0602 Shortness of breath: Secondary | ICD-10-CM | POA: Diagnosis not present

## 2018-02-22 DIAGNOSIS — Z01810 Encounter for preprocedural cardiovascular examination: Secondary | ICD-10-CM | POA: Diagnosis not present

## 2018-02-22 NOTE — Progress Notes (Signed)
OFFICE CONSULT NOTE  Chief Complaint:  Preoperative cardiac risk assessment  Primary Care Physician: Philip Anchors, MD  HPI:  Philip [name] is a 72 y.o. male who is being seen today for the evaluation of preoperative risk assessment at the request of Philip Isaac, MD.  This is a pleasant 72 year old male patient who was seen by Dr. Servando [name] for evaluation of esophageal cancer.  Recently he was found to have Barrett's esophagus with cancerous features on EGD.  He has a long-standing history of reflux disease.  He also has a history of asthma but no COPD and a remote smoking history.  Other cardiac risk factors include dyslipidemia and hypertension, obesity and age.  He does report shortness of breath with moderate exertion.  He denies any chest pain, palpitations or other associated symptoms.  He is able to walk up a flight of stairs, but it sounds like barely with shortness of breath.  He is also complaining of a burning sensation in the upper mid epigastrium, thought to be related to his esophageal cancer.  PMHx:  Past Medical History:  Diagnosis Date  . Abdominal hernia   . Anemia   . Asthma   . Barrett's esophagus   . Cancer (HCC)    Esophagel cancer  . Cataract    Bil surgery  . Cough    over 2 weeks  . Fluid retention in legs   . GERD (gastroesophageal reflux disease)   . H/O hiatal hernia   . Hypertension   . Myasthenia gravis ([place])   . Sleep apnea    don't use c-pap at home/ tried for several years  . SOB (shortness of breath)     Past Surgical History:  Procedure Laterality Date  . BALLOON DILATION N/A 10/19/2016   Procedure: BALLOON DILATION;  Surgeon: Philip Artist, MD;  Location: [facility];  Service: Endoscopy;  Laterality: N/A;  . COLONOSCOPY W/ POLYPECTOMY    . ESOPHAGOGASTRODUODENOSCOPY N/A 10/19/2016   Procedure: ESOPHAGOGASTRODUODENOSCOPY (EGD);  Surgeon: Philip Artist, MD;  Location: [facility];  Service: Endoscopy;  Laterality: N/A;    . ESOPHAGOGASTRODUODENOSCOPY (EGD) WITH PROPOFOL N/A 02/12/2018   Procedure: ESOPHAGOGASTRODUODENOSCOPY (EGD) WITH PROPOFOL;  Surgeon: Philip Landmark [name], MD;  Location: [facility];  Service: Gastroenterology;  Laterality: N/A;  . EUS N/A 02/12/2018   Procedure: UPPER ENDOSCOPIC ULTRASOUND (EUS) RADIAL;  Surgeon: Philip Landmark [name], MD;  Location: [facility];  Service: Gastroenterology;  Laterality: N/A;  . EYE SURGERY Bilateral    cataract  . LUMBAR LAMINECTOMY     1980's    FAMHx:  Family History  Problem Relation Age of Onset  . Heart disease Mother   . Lung cancer Father   . Colon cancer Neg Hx     SOCHx:   reports that he quit smoking about 41 years ago. He has a 7.00 pack-year smoking history. He has never used smokeless tobacco. He reports that he drank alcohol. He reports that he does not use drugs.  ALLERGIES:  No Known Allergies  ROS: Pertinent items noted in HPI and remainder of comprehensive ROS otherwise negative.  HOME MEDS: Current Outpatient Medications on File Prior to Visit  Medication Sig Dispense Refill  . ALBUTEROL IN Inhale 2 puffs into the lungs every 4 (four) hours as needed (shortness of breath or wheezing).     [name] amitriptyline (ELAVIL) 25 MG tablet Take 25 mg by mouth daily.     [name] aspirin EC 81 MG tablet Take 81 mg  by mouth daily.    [name] atorvastatin (LIPITOR) 10 MG tablet Take 10 mg by mouth at bedtime.     . diclofenac (VOLTAREN) 75 MG EC tablet Take 75 mg by mouth daily as needed.     . Esomeprazole Magnesium 20 MG TBEC Take 40 mg by mouth daily at 12 noon.     [name] lisinopril (PRINIVIL,ZESTRIL) 20 MG tablet Take 20 mg by mouth daily.    . Multiple Vitamin (MULTIVITAMIN) tablet Take 1 tablet by mouth daily.    . mycophenolate (CELLCEPT) 500 MG tablet Take 2 tablets (1,000 mg total) by mouth 2 (two) times daily. 120 tablet 12  . traMADol (ULTRAM) 50 MG tablet Take 50 mg by mouth every 6 (six) hours as needed for pain.     No current  facility-administered medications on file prior to visit.     LABS/IMAGING: No results found for this or any previous visit (from the past 48 hour(s)). No results found.  LIPID PANEL: No results found for: CHOL, TRIG, HDL, CHOLHDL, VLDL, LDLCALC, LDLDIRECT  WEIGHTS: Wt Readings from Last 3 Encounters:  02/22/18 288 lb 3.2 oz (130.7 kg)  02/15/18 299 lb (135.6 kg)  02/01/18 288 lb 1.6 oz (130.7 kg)    VITALS: BP (!) 120/58   Pulse 88   Ht 6' (1.829 m)   Wt 288 lb 3.2 oz (130.7 kg)   BMI 39.09 kg/m   EXAM: General appearance: alert and no distress Neck: no carotid bruit, no JVD and thyroid not enlarged, symmetric, no tenderness/mass/nodules Lungs: clear to auscultation bilaterally Heart: regular rate and rhythm Abdomen: soft, non-tender; bowel sounds normal; no masses,  no organomegaly Extremities: extremities normal, atraumatic, no cyanosis or edema Pulses: 2+ and symmetric Skin: Skin color, texture, turgor normal. No rashes or lesions Neurologic: Grossly normal Psych: Pleasant  EKG: Normal sinus rhythm at 88, incomplete right bundle branch block- personally reviewed  ASSESSMENT: 1. Indeterminate preoperative risk 2. Esophageal cancer 3. Hypertension 4. Dyslipidemia 5. Abnormal EKG with right bundle branch block 6. Dyspnea on exertion  PLAN: 1.   [name] has shortness of breath and right bundle branch block on EKG.  He has a history of hypertension and dyslipidemia with esophageal cancer.  He has a remote smoker.  He denies any anginal symptoms however shortness of breath could be further evaluated.  Especially given his EKG abnormalities.  I recommend an echocardiogram.  If there are no significant abnormalities, then he can likely proceed with surgery at acceptable risk.  Thanks for the kind referral.  Follow-up with me as needed based on echo findings.  Philip Casino, MD, [facility], [place] Director of the Advanced Lipid  Disorders &  Cardiovascular Risk Reduction Clinic Diplomate of the American Board of Clinical Lipidology Attending Cardiologist  Direct Dial: [phone]  Fax: [phone]  Website:  [email or web address] 02/22/2018, 2:10 PM

## 2018-02-22 NOTE — Patient Instructions (Signed)
Medication Instructions:  Continue current medications If you need a refill on your cardiac medications before your next appointment, please call your pharmacy.   Lab work: NONE  Testing/Procedures: Your physician has requested that you have an echocardiogram. Echocardiography is a painless test that uses sound waves to create images of your heart. It provides your doctor with information about the size and shape of your heart and how well your heart's chambers and valves are working. This procedure takes approximately one hour. There are no restrictions for this procedure. -- done at [address] - [address]rd Floor  Follow-Up: At [facility], you and your health needs are our priority.  As part of our continuing mission to provide you with exceptional heart care, we have created designated Provider Care Teams.  These Care Teams include your primary Cardiologist (physician) and Advanced Practice Providers (APPs -  Physician Assistants and Nurse Practitioners) who all work together to provide you with the care you need, when you need it. You will need a follow up appointment AS NEEDED with Dr. Debara Pickett.  The following Advanced Practice Providers are  on your designated Care Team: Almyra Deforest, [place] . Fabian Sharp, PA-C  Any Other Special Instructions Will Be Listed Below (If Applicable).

## 2018-02-25 NOTE — Progress Notes (Signed)
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    01/22/2018 Procedure    COLONOSCOPY IMPRESSION - Redundant colon. - The examination was otherwise normal on direct and retroflexion views. - No specimens collected.  UPPER ENDOSCOPY IMPRESSION: - Esophageal mucosal changes secondary to established short-segment Barrett's disease. - Mucosal nodule found in the esophagus. Biopsied. - Erythematous mucosa in the prepyloric region of the stomach. - Submucosal nodule found in the duodenum.     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 No esophageal primary identified. No findings of metastatic disease in the chest, abdomen, or pelvis. 2.  Aortic Atherosclerosis (ICD10-I70.0).    02/12/2018 Procedure    EUS Impression: - A mass was found in the distal esophagus into the gastroesophageal junction and extending to proximal cardia. A tissue diagnosis was obtained prior to this exam consistent with adenocarcinoma. This was staged at T2N0Mx, because of a loss of interface of the muscularis propria as the distal GE Junction becomes the cardia and the mass moves into this region, but there are many more regions and images that suggest a T1 lesion into the submucosa.    02/26/2018 Cancer Staging    Staging form: Esophagus - Adenocarcinoma, AJCC 8th Edition - Clinical stage from 02/26/2018: Stage IIB (cT2, cN0, cM0, G3) - Signed by  [name], NP on 02/26/2018     INTERVAL HISTORY: Philip [name] was last seen in consult on 02/01/18 for esophagus cancer. He subsequently underwent CT CAP on 10/7 which was negative for evidence of metastatic disease in the chest, abdomen, or pelvis. EUS on 10/14 by Dr. Rush Landmark staged this as T2N0 disease. He was then seen by Dr. Servando Snare for surgical consult on 10/17. He was felt to be a high risk surgical candidate due to his pulmonary status and h/o myasthenia gravis; he was referred to [facility] GI Dr. Renford Dills for second opinion.   Today, he returns by himself. He denies changes in his health since last visit. Energy level is fair. Appetite is normal. He has lost 10 lbs intentionally by eating less. He is not very active due to moderate dyspnea on exertion and asthma. Denies dysphagia, n/v/c/d. No recent fever, chills, cough, chest pain.   He is currently undergoing cardiac clearance, next f/u with Dr. Servando Snare is 10/31, his surgical appt at [facility] is not set.    MEDICAL HISTORY:  Past Medical History:  Diagnosis Date  . Abdominal hernia   . Anemia   . Asthma   . Barrett's esophagus   . Cancer (HCC)    Esophagel cancer  . Cataract    Bil surgery  . Cough    over 2 weeks  . Fluid retention in legs   . GERD (gastroesophageal reflux disease)   . H/O hiatal hernia   . Hypertension   . Myasthenia gravis ([place])   . Sleep apnea    don't use c-pap at home/ tried for several years  . SOB (shortness of breath)  SURGICAL HISTORY: Past Surgical History:  Procedure Laterality Date  . BALLOON DILATION N/A 10/19/2016   Procedure: BALLOON DILATION;  Surgeon: Ladene Artist, MD;  Location: [facility];  Service: Endoscopy;  Laterality: N/A;  . COLONOSCOPY W/ POLYPECTOMY    . ESOPHAGOGASTRODUODENOSCOPY N/A 10/19/2016   Procedure: ESOPHAGOGASTRODUODENOSCOPY (EGD);  Surgeon: Ladene Artist, MD;  Location: [facility];  Service: Endoscopy;  Laterality: N/A;  .  ESOPHAGOGASTRODUODENOSCOPY (EGD) WITH PROPOFOL N/A 02/12/2018   Procedure: ESOPHAGOGASTRODUODENOSCOPY (EGD) WITH PROPOFOL;  Surgeon: [name], MD;  Location: [facility];  Service: Gastroenterology;  Laterality: N/A;  . EUS N/A 02/12/2018   Procedure: UPPER ENDOSCOPIC ULTRASOUND (EUS) RADIAL;  Surgeon: [name], MD;  Location: [facility];  Service: Gastroenterology;  Laterality: N/A;  . EYE SURGERY Bilateral    cataract  . LUMBAR LAMINECTOMY     1980's    I have reviewed the social history and family history with the patient and they are unchanged from previous note.  ALLERGIES:  has No Known Allergies.  MEDICATIONS:  Current Outpatient Medications  Medication Sig Dispense Refill  . ALBUTEROL IN Inhale 2 puffs into the lungs every 4 (four) hours as needed (shortness of breath or wheezing).     [name] amitriptyline (ELAVIL) 25 MG tablet Take 25 mg by mouth daily.     [name] aspirin EC 81 MG tablet Take 81 mg by mouth daily.    [name] atorvastatin (LIPITOR) 10 MG tablet Take 10 mg by mouth at bedtime.     . diclofenac (VOLTAREN) 75 MG EC tablet Take 75 mg by mouth daily as needed.     . Esomeprazole Magnesium 20 MG TBEC Take 40 mg by mouth daily at 12 noon.     [name] lisinopril (PRINIVIL,ZESTRIL) 20 MG tablet Take 20 mg by mouth daily.    . Multiple Vitamin (MULTIVITAMIN) tablet Take 1 tablet by mouth daily.    . mycophenolate (CELLCEPT) 500 MG tablet Take 2 tablets (1,000 mg total) by mouth 2 (two) times daily. 120 tablet 12  . traMADol (ULTRAM) 50 MG tablet Take 50 mg by mouth every 6 (six) hours as needed for pain.     No current facility-administered medications for this visit.     PHYSICAL EXAMINATION: ECOG PERFORMANCE STATUS: 0 - Asymptomatic  Vitals:   02/26/18 1422  BP: 133/77  Pulse: 97  Resp: 18  Temp: 97.9 F (36.6 C)  SpO2: 96%   Filed Weights   02/26/18 1422  Weight: 289 lb 6.4 oz (131.3 kg)    GENERAL:alert, no distress and comfortable SKIN:   no rashes or significant lesions EYES: sclera clear OROPHARYNX:no thrush or ulcers LYMPH:  no palpable cervical, supraclavicular, or axillary lymphadenopathy LUNGS: distant breath sounds with normal breathing effort HEART: regular rate & rhythm, trace lower extremity edema ABDOMEN:abdomen soft, round, non-tender and normal bowel sounds Musculoskeletal:no cyanosis of digits and no clubbing  NEURO: alert & oriented x 3 with fluent speech, no focal motor deficits  LABORATORY DATA:  I have reviewed the data as listed CBC Latest Ref Rng & Units 02/26/2018 12/04/2017 04/03/2017  WBC 4.0 - 10.5 K/uL 7.2 6.8 7.4  Hemoglobin 13.0 - 17.0 g/dL 14.2 12.9(L) 12.9(L)  Hematocrit 39.0 - 52.0 % 42.3 38.0 38.3(L)  Platelets 150 - 400 K/uL 216 217 200     CMP Latest Ref Rng & Units 02/26/2018 01/31/2018 12/04/2017  Glucose 70 - 99 mg/dL 100(H) - 97  BUN 8 - 23 mg/dL 16 17 19   Creatinine 0.61 -  1.24 mg/dL 0.93 0.87 0.88  Sodium 135 - 145 mmol/L 138 - 138  Potassium 3.5 - 5.1 mmol/L 4.0 - 4.6  Chloride 98 - 111 mmol/L 107 - 103  CO2 22 - 32 mmol/L 24 - 21  Calcium 8.9 - 10.3 mg/dL 11.5(H) - 11.3(H)  Total Protein 6.5 - 8.1 g/dL 6.7 - 6.0  Total Bilirubin 0.3 - 1.2 mg/dL 0.8 - 0.6  Alkaline Phos 38 - 126 U/L 80 - 75  AST 15 - 41 U/L 16 - 16  ALT 0 - 44 U/L 16 - 17    PROCEDURE:  Staging EUS Impression: 02/12/18  - A mass was found in the distal esophagus into the gastroesophageal junction and extending to proximal cardia. A tissue diagnosis was obtained prior to this exam consistent with adenocarcinoma. This was staged at T2N0Mx, because of a loss of interface of the muscularis propria as the distal GE Junction becomes the cardia and the mass moves into this region, but there are many more regions and images that suggest a T1 lesion into the submucosa.  RADIOGRAPHIC STUDIES: I have personally reviewed the radiological images as listed and agreed with the findings in the report. No results found.    STAGING CT CAP 02/05/18  IMPRESSION: 1. No esophageal primary identified. No findings of metastatic disease in the chest, abdomen, or pelvis. 2.  Aortic Atherosclerosis (ICD10-I70.0).  ASSESSMENT & PLAN: 72 year old caucasian male with barrett's esophagus found to have mass at GE junction  1. 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 He has a small mass at the GE junction in the setting of barrett's esophagus; he is asymptomatic.  -His staging CT CAP on 10/7 was negative for metastatic disease  -Staging EUS per Dr. Rush Landmark on 10/14 suggests T1 lesion with question of T2 due to loss of interface of the muscularis propria as the distal GE junction becomes the cardia and the mass moves into that region  -He was seen by Dr. Servando Snare on 10/17 and felt to be a high risk surgical candidate due to pulmonary status, dyspnea, and h/o MG. Next f/u 10/31 -He has been referred to [facility] Dr. Adria Devon but has not had the appointment yet. -he is undergoing cardiac clearance for surgery, echo this week  -Given his EUS findings, Dr. Burr Medico does not recommend neoadjuvant chemotherapy.  -his surgical plan is not final yet, I suggest to f/u in 4 weeks after he has cardiac clearance and f/u with Dr. Servando Snare again. Certainly if he gets clearance for surgery and a date is set, he can call us to postpone f/u and we will see him after surgery. He agrees.  -if the patient requires chemotherapy at any point he is interested in getting treatment in [place], but is aware they do not administer radiation therapy there.  -lab, f/u with Dr. Burr Medico in 4 weeks  2. Anemia -He has mild normocytic anemia from 12/2016. Hgb 12.9 on 12/04/17.  -His anemia resolved, Hgb 14.2 today; iron studies are adequate, ferritin is pending  3. Hypercalcemia  -This dates back to 10/17/16, fluctuates -I recommend he avoid calcium supplement and increase  oral hydration, he agrees   PLAN: -Labs, imaging, EUS reviewed  -No chemotherapy recommended at this time per Dr. Burr Medico  -continue cardiac clearance for surgery -f/u with Dr. Runell Gess 10/31, referral to [facility] pending -lab, f/u in 4 weeks    All questions were answered. The patient  knows to call the clinic with any problems, questions or concerns. No barriers to learning was detected. I spent 20 minutes counseling the patient face to face. The total time spent in the appointment was 25 minutes and more than 50% was on counseling and review of test results     [name], NP 02/26/18

## 2018-02-26 ENCOUNTER — Encounter: Payer: Self-pay | Admitting: Nurse Practitioner

## 2018-02-26 ENCOUNTER — Telehealth: Payer: Self-pay

## 2018-02-26 ENCOUNTER — Inpatient Hospital Stay (HOSPITAL_BASED_OUTPATIENT_CLINIC_OR_DEPARTMENT_OTHER): Payer: Medicare Other | Admitting: Nurse Practitioner

## 2018-02-26 ENCOUNTER — Inpatient Hospital Stay: Payer: Medicare Other

## 2018-02-26 VITALS — BP 133/77 | HR 97 | Temp 97.9°F | Resp 18 | Ht 72.0 in | Wt 289.4 lb

## 2018-02-26 DIAGNOSIS — C160 Malignant neoplasm of cardia: Secondary | ICD-10-CM

## 2018-02-26 DIAGNOSIS — K2270 Barrett's esophagus without dysplasia: Secondary | ICD-10-CM | POA: Diagnosis not present

## 2018-02-26 DIAGNOSIS — Z79899 Other long term (current) drug therapy: Secondary | ICD-10-CM

## 2018-02-26 DIAGNOSIS — D649 Anemia, unspecified: Secondary | ICD-10-CM

## 2018-02-26 LAB — CMP (CANCER CENTER ONLY)
ALT: 16 U/L (ref 0–44)
AST: 16 U/L (ref 15–41)
Albumin: 3.8 g/dL (ref 3.5–5.0)
Alkaline Phosphatase: 80 U/L (ref 38–126)
Anion gap: 7 (ref 5–15)
BUN: 16 mg/dL (ref 8–23)
CHLORIDE: 107 mmol/L (ref 98–111)
CO2: 24 mmol/L (ref 22–32)
CREATININE: 0.93 mg/dL (ref 0.61–1.24)
Calcium: 11.5 mg/dL — ABNORMAL HIGH (ref 8.9–10.3)
GFR, Est AFR Am: >60 mL/min (ref >60)
GFR, Estimated: >60 mL/min (ref >60)
Glucose, Bld: 100 mg/dL — ABNORMAL HIGH (ref 70–99)
Potassium: 4 mmol/L (ref 3.5–5.1)
SODIUM: 138 mmol/L (ref 135–145)
Total Bilirubin: 0.8 mg/dL (ref 0.3–1.2)
Total Protein: 6.7 g/dL (ref 6.5–8.1)

## 2018-02-26 LAB — CEA (IN HOUSE-CHCC): CEA (CHCC-In House): 2.45 ng/mL (ref 0.00–5.00)

## 2018-02-26 LAB — CBC WITH DIFFERENTIAL (CANCER CENTER ONLY)
Abs Immature Granulocytes: 0.04 10*3/uL (ref 0.00–0.07)
BASOS ABS: 0.1 10*3/uL (ref 0.0–0.1)
Basophils Relative: 1 %
Eosinophils Absolute: 0.2 10*3/uL (ref 0.0–0.5)
Eosinophils Relative: 3 %
HEMATOCRIT: 42.3 % (ref 39.0–52.0)
HEMOGLOBIN: 14.2 g/dL (ref 13.0–17.0)
IMMATURE GRANULOCYTES: 1 %
LYMPHS ABS: 1.5 10*3/uL (ref 0.7–4.0)
LYMPHS PCT: 21 %
MCH: 30.9 pg (ref 26.0–34.0)
MCHC: 33.6 g/dL (ref 30.0–36.0)
MCV: 92 fL (ref 80.0–100.0)
Monocytes Absolute: 0.9 10*3/uL (ref 0.1–1.0)
Monocytes Relative: 12 %
NRBC: 0 % (ref 0.0–0.2)
Neutro Abs: 4.6 10*3/uL (ref 1.7–7.7)
Neutrophils Relative %: 62 %
Platelet Count: 216 10*3/uL (ref 150–400)
RBC: 4.6 MIL/uL (ref 4.22–5.81)
RDW: 13.5 % (ref 11.5–15.5)
WBC Count: 7.2 10*3/uL (ref 4.0–10.5)

## 2018-02-26 LAB — IRON AND TIBC
Iron: 92 ug/dL (ref 42–163)
Saturation Ratios: 27 % — ABNORMAL LOW (ref 42–163)
TIBC: 348 ug/dL (ref 202–409)
UIBC: 255 ug/dL

## 2018-02-26 LAB — FERRITIN: Ferritin: 97 ng/mL (ref 24–336)

## 2018-02-26 NOTE — Telephone Encounter (Signed)
Printed avs and calender of upcoming appointment. Per 10/28 los 

## 2018-02-27 ENCOUNTER — Ambulatory Visit (HOSPITAL_COMMUNITY): Payer: Medicare Other | Attending: Cardiovascular Disease

## 2018-02-27 ENCOUNTER — Other Ambulatory Visit: Payer: Self-pay

## 2018-02-27 DIAGNOSIS — R0602 Shortness of breath: Secondary | ICD-10-CM | POA: Insufficient documentation

## 2018-02-27 DIAGNOSIS — Z01810 Encounter for preprocedural cardiovascular examination: Secondary | ICD-10-CM | POA: Diagnosis present

## 2018-02-27 MED ORDER — PERFLUTREN LIPID MICROSPHERE
1.0000 mL | INTRAVENOUS | Status: AC | PRN
  Administered 2018-02-27: 3 mL via INTRAVENOUS

## 2018-03-01 ENCOUNTER — Other Ambulatory Visit: Payer: Self-pay | Admitting: *Deleted

## 2018-03-01 ENCOUNTER — Ambulatory Visit (INDEPENDENT_AMBULATORY_CARE_PROVIDER_SITE_OTHER): Payer: Medicare Other | Admitting: Cardiothoracic Surgery

## 2018-03-01 VITALS — BP 131/79 | HR 90 | Resp 20 | Ht 72.0 in | Wt 294.0 lb

## 2018-03-01 DIAGNOSIS — Z01818 Encounter for other preprocedural examination: Secondary | ICD-10-CM

## 2018-03-01 DIAGNOSIS — C160 Malignant neoplasm of cardia: Secondary | ICD-10-CM | POA: Diagnosis not present

## 2018-03-01 NOTE — Progress Notes (Signed)
[address]       [place] [address]             [facility] Record #[id number] Date of Birth: 1945/06/23  Referring: [name], NP Primary Care: [name], MD Primary Cardiologist: No primary care provider on file.  Chief Complaint:    Chief Complaint  Patient presents with  . Esophageal Cancer    2 wk f/u, ECHO 02/27/18    History of Present Illness:    Philip [name] 72 y.o. 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  Patient has had a known history of Barrett's esophagus from previous endoscopies follow-up endoscopy done recently led to a positive biopsy for adenocarcinoma.  Subsequent EUS was performed.  Patient was seen by medical oncology and referred for surgical evaluation. He is a distant smoker briefly and quit at the age of 35.      Currently on CellCept  for myasthenia gravis.   Patient has seen cardiology     followed by Neurology for recently diagnosed myasthenia gravis-January 2019 UPDATE (12/04/17, VRP): Since last visit, doing well. Symptoms are resolved. Tolerating cellcept and mestinon. No more double vision. No generalized fatigue.   UPDATE (03/13/17, VRP): Since last visit, doing well. IVIG helped quite a bit. Tolerating pyridostigmine. No alleviating or aggravating factors. No generalized weakness but notes some non-specific "tired" feeling. No double vision or drooping eyelids.   UPDATE (01/17/17, VRP): Since last visit, doing well on pyridostigmine. It worked the next day. AchR ab are positive. Some generalized fatigue and weakness are noted and new since last visit. Went to ER for laryngospasm (?upper resp infx) and tx'd with 4 days prednisone and oral lidocaine.    PRIOR HPI (12/21/16): 72 year old male here for evaluation of double vision. Her past to 3 weeks patient has had intermittent double  vision.  Symptoms present with both eyes open but improve if he closes one eye or uses an eye patch. He describes objects as being up and to the side from each other.  They slightly overlap.  This fluctuates.  Symptoms worse later in the day.  He also had some swallowing difficulties for the past 2 months without a specific cause to be found. He has some generalized fatigue as well. Patient went to [facility] eye clinic, with no specific ocular pathology found.  Patient was referred here for consideration of possible neuromuscular, neuro inflammatory or neuro vascular cause of symptoms. No unilateral numbness or weakness.  No sudden strokelike symptoms.  No headaches.  No prodromal factors.     Current Activity/ Functional Status:  Patient is independent with mobility/ambulation, transfers, ADL's, IADL's.   Zubrod Score: At the time of surgery this patient's most appropriate activity status/level should be described as: []     0    Normal activity, no symptoms [x]     1    Restricted in physical strenuous activity but ambulatory, able to do out light work []     2    Ambulatory and capable of self care, unable to do work activities, up and about               >50 % of waking hours                              []   3    Only limited self care, in bed greater than 50% of waking hours []     4    Completely disabled, no self care, confined to bed or chair []     5    Moribund   Past Medical History:  Diagnosis Date  . Abdominal hernia   . Anemia   . Asthma   . Barrett's esophagus   . Cancer (HCC)    Esophagel cancer  . Cataract    Bil surgery  . Cough    over 2 weeks  . Fluid retention in legs   . GERD (gastroesophageal reflux disease)   . H/O hiatal hernia   . Hypertension   . Myasthenia gravis ([place])   . Sleep apnea    don't use c-pap at home/ tried for several years  . SOB (shortness of breath)     Past Surgical History:  Procedure Laterality Date  . BALLOON DILATION N/A 10/19/2016     Procedure: BALLOON DILATION;  Surgeon: Ladene Artist, MD;  Location: [facility];  Service: Endoscopy;  Laterality: N/A;  . COLONOSCOPY W/ POLYPECTOMY    . ESOPHAGOGASTRODUODENOSCOPY N/A 10/19/2016   Procedure: ESOPHAGOGASTRODUODENOSCOPY (EGD);  Surgeon: Ladene Artist, MD;  Location: [facility];  Service: Endoscopy;  Laterality: N/A;  . ESOPHAGOGASTRODUODENOSCOPY (EGD) WITH PROPOFOL N/A 02/12/2018   Procedure: ESOPHAGOGASTRODUODENOSCOPY (EGD) WITH PROPOFOL;  Surgeon: [name], MD;  Location: [facility];  Service: Gastroenterology;  Laterality: N/A;  . EUS N/A 02/12/2018   Procedure: UPPER ENDOSCOPIC ULTRASOUND (EUS) RADIAL;  Surgeon: [name], MD;  Location: [facility];  Service: Gastroenterology;  Laterality: N/A;  . EYE SURGERY Bilateral    cataract  . LUMBAR LAMINECTOMY     1980's    Family History  Problem Relation Age of Onset  . Heart disease Mother   . Lung cancer Father   . Colon cancer Neg Hx      Social History   Tobacco Use  Smoking Status Former Smoker  . Packs/day: 1.00  . Years: 7.00  . Pack years: 7.00  . Last attempt to quit: 12/21/1976  . Years since quitting: 41.2  Smokeless Tobacco Never Used    Social History   Substance and Sexual Activity  Alcohol Use Not Currently   Comment: ocassional     No Known Allergies  Current Outpatient Medications  Medication Sig Dispense Refill  . ALBUTEROL IN Inhale 2 puffs into the lungs every 4 (four) hours as needed (shortness of breath or wheezing).     [name] amitriptyline (ELAVIL) 25 MG tablet Take 25 mg by mouth daily.     [name] aspirin EC 81 MG tablet Take 81 mg by mouth daily.    [name] atorvastatin (LIPITOR) 10 MG tablet Take 10 mg by mouth at bedtime.     . diclofenac (VOLTAREN) 75 MG EC tablet Take 75 mg by mouth daily as needed.     . Esomeprazole Magnesium 20 MG TBEC Take 40 mg by mouth daily at 12 noon.     [name] lisinopril (PRINIVIL,ZESTRIL) 20 MG tablet Take 20 mg by mouth  daily.    . Multiple Vitamin (MULTIVITAMIN) tablet Take 1 tablet by mouth daily.    . mycophenolate (CELLCEPT) 500 MG tablet Take 2 tablets (1,000 mg total) by mouth 2 (two) times daily. 120 tablet 12  . traMADol (ULTRAM) 50 MG tablet Take 50 mg by mouth every 6 (six) hours as needed for pain.     No current facility-administered medications  for this visit.     Pertinent items are noted in HPI.   Review of Systems:     Cardiac Review of Systems: [Y] = yes  or   [ N ] = no   Chest Pain [ n  ]  Resting SOB [ y ] Exertional SOB  [ y ]  Vertell Limber Florencio.Farrier  ]   Pedal Edema [ y  ]    Palpitations [ n ] Syncope  [n  ]   Presyncope [ n  ]   General Review of Systems: [Y] = yes [  ]=no Constitional: recent weight change [n  ];  Wt loss over the last 3 months [   ] anorexia [  ]; fatigue [  ]; nausea [  ]; night sweats [  ]; fever [  ]; or chills [  ];           Eye : blurred vision [  ]; diplopia [   ]; vision changes [  ];  Amaurosis fugax[  ]; Resp: cough [  ];  wheezing[  ];  hemoptysis[  ]; shortness of breath[  ]; paroxysmal nocturnal dyspnea[  ]; dyspnea on exertion[y ]; or orthopnea[  ];  GI:  gallstones[  ], vomiting[  ];  dysphagia[  ]; melena[  ];  hematochezia [  ]; heartburn[  ];   Hx of  Colonoscopy[  ]; GU: kidney stones [  ]; hematuria[  ];   dysuria [  ];  nocturia[  ];  history of     obstruction [  ]; urinary frequency [  ]             Skin: rash, swelling[  ];, hair loss[  ];  peripheral edema[  ];  or itching[  ]; Musculosketetal: myalgias[  ];  joint swelling[  ];  joint erythema[  ];  joint pain[  ];  back pain[  ];  Heme/Lymph: bruising[  ];  bleeding[  ];  anemia[  ];  Neuro: TIA[  ];  headaches[  ];  stroke[  ];  vertigo[  ];  seizures[  ];   paresthesias[  ];  difficulty walking[ y]; double vision   Psych:depression[  ]; anxiety[  ];  Endocrine: diabetes[  ];  thyroid dysfunction[  ];  Immunizations: Flu up to date [  ]; Pneumococcal up to date [  ];  Other:     PHYSICAL  EXAMINATION: BP 131/79   Pulse 90   Resp 20   Ht 6' (1.829 m)   Wt 294 lb (133.4 kg)   SpO2 98% Comment: RA  BMI 39.87 kg/m  General appearance: alert and cooperative Head: Normocephalic, without obvious abnormality, atraumatic Neck: no adenopathy, no carotid bruit, no JVD, supple, symmetrical, trachea midline and thyroid not enlarged, symmetric, no tenderness/mass/nodules Lymph nodes: Cervical, supraclavicular, and axillary nodes normal. Resp: clear to auscultation bilaterally Back: symmetric, no curvature. ROM normal. No CVA tenderness. Cardio: regular rate and rhythm, S1, S2 normal, no murmur, click, rub or gallop GI: soft, non-tender; bowel sounds normal; no masses,  no organomegaly Extremities: extremities normal, atraumatic, no cyanosis or edema and Homans sign is negative, no sign of DVT Neurologic: Grossly normal  Diagnostic Studies & Laboratory data:     Recent Radiology Findings:     Ct chest  Abdomen Pelvis W Contrast  Result Date: 02/06/2018 CLINICAL DATA:  Esophageal adenocarcinoma. Biopsy on 01/22/2018 demonstrating poorly differentiated adenocarcinoma at GE junction. EXAM: CT CHEST, ABDOMEN, AND PELVIS  WITH CONTRAST TECHNIQUE: Multidetector CT imaging of the chest, abdomen and pelvis was performed following the standard protocol during bolus administration of intravenous contrast. CONTRAST:  180mL ISOVUE-300 IOPAMIDOL (ISOVUE-300) INJECTION 61% COMPARISON:  Chest CT 01/19/2017.  No prior abdominopelvic CTs. FINDINGS: CT CHEST FINDINGS Cardiovascular: Bovine arch. Normal heart size, without pericardial effusion. Lipomatous hypertrophy of the interatrial septum. No central pulmonary embolism, on this non-dedicated study. Mediastinum/Nodes: No supraclavicular adenopathy. No mediastinal or hilar adenopathy. Normal appearance of the esophagus. Lungs/Pleura: No pleural fluid. The previously described 4 mm lingular nodule is no longer identified. Musculoskeletal: No acute osseous  abnormality. CT ABDOMEN PELVIS FINDINGS Hepatobiliary: Normal liver. Normal gallbladder, without biliary ductal dilatation. Pancreas: Normal, without mass or ductal dilatation. Spleen: Normal in size, without focal abnormality. Adrenals/Urinary Tract: Normal adrenal glands. Normal kidneys, without hydronephrosis. Normal urinary bladder. Stomach/Bowel: Normal stomach, without wall thickening. Scattered colonic diverticula. Normal terminal ileum. Nonobstructive 12 mm lipoma within the inferior genu of the duodenum on coronal image 92 and transverse image 80/2. Otherwise normal small bowel. Vascular/Lymphatic: Aortic atherosclerosis. No abdominopelvic adenopathy. Reproductive: Normal prostate. Other: No significant free fluid. A tiny fat containing left inguinal hernia. No evidence of omental or peritoneal disease. Musculoskeletal: No acute osseous abnormality. Thoracolumbar spondylosis. IMPRESSION: 1. No esophageal primary identified. No findings of metastatic disease in the chest, abdomen, or pelvis. 2.  Aortic Atherosclerosis (ICD10-I70.0). Electronically Signed   By: Abigail Miyamoto M.D.   On: 02/06/2018 11:13     I have independently reviewed the above radiology studies  and reviewed the findings with the patient.   Recent Lab Findings: Lab Results  Component Value Date   WBC 7.2 02/26/2018   HGB 14.2 02/26/2018   HCT 42.3 02/26/2018   PLT 216 02/26/2018   GLUCOSE 100 (H) 02/26/2018   ALT 16 02/26/2018   AST 16 02/26/2018   NA 138 02/26/2018   K 4.0 02/26/2018   CL 107 02/26/2018   CREATININE 0.93 02/26/2018   BUN 16 02/26/2018   CO2 24 02/26/2018   TSH 2.040 12/21/2016   HGBA1C 5.4 12/21/2016  EGD: There were esophageal mucosal changes secondary to established short-segment Barrett's disease present at the gastroesophageal junction. The maximum longitudinal extent of these mucosal changes was 1 cm in length. Findings: - A single small, inflamed mucosal nodule was found at the  gastroesophageal junction within the Barrett's esophagus. Biopsies were taken with a cold forceps for histology. - Patchy mildly erythematous mucosa without bleeding was found in the prepyloric region of the stomach. - The cardia and gastric fundus were normal on retroflexion. - A single 12 mm submucosal nodule was found in the second portion of the duodenum. - The exam of the duodenum was otherwise normal.  Esophageal mucosal changes secondary to established short-segment Barrett's disease. - Mucosal nodule found in the esophagus. Biopsied. - Erythematous mucosa in the prepyloric region of the stomach. - Submucosal nodule found in the duodenum. Impression: - Patient has a contact number available for emergencies. The signs and symptoms of potential delayed complications were discussed with the patient. Return to normal activities tomorrow. Written discharge instructions were provided to the patient. - Resume previous diet. - Continue present medications. - Await pathology results. - See the other procedure note for documentation of additional recommendations.     EUS: by Dr Justice Britain  EGD Impression: - No gross lesions in proximal/middle esophagus. - Friable (with spontaneous bleeding), granular, inflamed, ulcerated mucosa in the esophagus - consistent with previously noted biopsies with adenocarcinoma at  the distal esophagus/GEJxn/proximal cardia. - Nodular mucosa in the gastric antrum. - No other gross lesions in the stomach. - Submucosal nodule found in the duodenum. EUS Impression: - A mass was found in the distal esophagus into the gastroesophageal junction and extending to proximal cardia. A tissue diagnosis was obtained prior to this exam consistent with adenocarcinoma. This was staged at T2N0Mx, because of a loss of interface of the muscularis propria as the distal GE Junction becomes the cardia and the mass moves into this region, but there are many more  regions and images that suggest a T1 lesion into the submucosa. - No malignant-appearing lymph nodes were visualized in the anterior mediastinum (level 6), subcarinal mediastinum (level 7), middle paraesophageal mediastinum (level 458M), lower paraesophageal mediastinum (level 8L), diaphragmatic region (level 15), mediastinal periaortic region, paracardial region (level 16), left gastric region (level 17) and gastrohepatic ligament (level 18). Impression: esophagus/GEJxn/proximal cardia. - Nodular mucosa in the gastric antrum. - No other gross lesions in the stomach. - Submucosal nodule found in the duodenum. EUS Impression: - A mass was found in the distal esophagus into the gastroesophageal junction and extending to proximal cardia. A tissue diagnosis was obtained prior to this exam consistent with adenocarcinoma. This was staged at T2N0Mx, because of a loss of interface of the muscularis propria as the distal GE Junction becomes the cardia and the mass moves into this region, but there are many more regions and images that suggest a T1 lesion into the submucosa. - No malignant-appearing lymph nodes were visualized in the anterior mediastinum (level 6), subcarinal mediastinum (level 7), middle paraesophageal mediastinum (level 458M), lower paraesophageal mediastinum (level 8L), diaphragmatic region (level 15), mediastinal periaortic region, paracardial region (level 16), left gastric region (level 17) and  Diagnosis [id number] [facility]   Surgical [P], GE junction nodule BX - POORLY 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  6 min walk test- Walked 784 feet Borg 3 sat to 93%    Assessment / Plan:   Patient with short segment Barrett's  With small area of  T1 adenocarcinoma , question area of T2 invasion on EUS.  No evidence of disease on CT of chest or abdomen.  The  patient's pulmonary status, significant shortness of  breath, history of myasthenia gravis all  making surgical resection carry an increase risk.  We have sent appropriate studies to Dr. Rayne Du Shaheen/ [facility] to review the current studies and offer an option as to possible less invasive treatment for the patient.   Patient has been seen by cardiology  Will obtain CPX testing to evaluate his decreased diffusion capacity and overall pulmonary function exercise tolerance.  Plan to see him back in 1 to 2 weeks after CPX testing and review of his clinical information in regards to possible endoscopic mucosal resection.      I  spent 60 minutes with  the patient face to face and greater then 50% of the time was spent in counseling and coordination of care.    Grace Isaac MD      [address] ,[place] [address] Office [phone]   Beeper [phone]  03/01/2018 4:31 PM

## 2018-03-28 ENCOUNTER — Inpatient Hospital Stay (HOSPITAL_BASED_OUTPATIENT_CLINIC_OR_DEPARTMENT_OTHER): Payer: Medicare Other | Admitting: Hematology

## 2018-03-28 ENCOUNTER — Telehealth (HOSPITAL_COMMUNITY): Payer: Self-pay | Admitting: *Deleted

## 2018-03-28 ENCOUNTER — Inpatient Hospital Stay: Payer: Medicare Other | Attending: Nurse Practitioner

## 2018-03-28 VITALS — BP 159/76 | HR 91 | Temp 98.2°F | Resp 20 | Ht 73.0 in | Wt 299.2 lb

## 2018-03-28 DIAGNOSIS — I10 Essential (primary) hypertension: Secondary | ICD-10-CM | POA: Insufficient documentation

## 2018-03-28 DIAGNOSIS — Z7982 Long term (current) use of aspirin: Secondary | ICD-10-CM | POA: Diagnosis not present

## 2018-03-28 DIAGNOSIS — D649 Anemia, unspecified: Secondary | ICD-10-CM

## 2018-03-28 DIAGNOSIS — Z79899 Other long term (current) drug therapy: Secondary | ICD-10-CM | POA: Insufficient documentation

## 2018-03-28 DIAGNOSIS — C160 Malignant neoplasm of cardia: Secondary | ICD-10-CM

## 2018-03-28 LAB — CBC WITH DIFFERENTIAL (CANCER CENTER ONLY)
Abs Immature Granulocytes: 0.04 10*3/uL (ref 0.00–0.07)
BASOS PCT: 1 %
Basophils Absolute: 0.1 10*3/uL (ref 0.0–0.1)
EOS ABS: 0.2 10*3/uL (ref 0.0–0.5)
Eosinophils Relative: 3 %
HCT: 41.8 % (ref 39.0–52.0)
Hemoglobin: 13.8 g/dL (ref 13.0–17.0)
Immature Granulocytes: 1 %
Lymphocytes Relative: 19 %
Lymphs Abs: 1.6 10*3/uL (ref 0.7–4.0)
MCH: 30.6 pg (ref 26.0–34.0)
MCHC: 33 g/dL (ref 30.0–36.0)
MCV: 92.7 fL (ref 80.0–100.0)
MONO ABS: 0.7 10*3/uL (ref 0.1–1.0)
Monocytes Relative: 9 %
NEUTROS PCT: 67 %
Neutro Abs: 5.5 10*3/uL (ref 1.7–7.7)
PLATELETS: 209 10*3/uL (ref 150–400)
RBC: 4.51 MIL/uL (ref 4.22–5.81)
RDW: 13.3 % (ref 11.5–15.5)
WBC: 8.1 10*3/uL (ref 4.0–10.5)
nRBC: 0 % (ref 0.0–0.2)

## 2018-03-28 LAB — CMP (CANCER CENTER ONLY)
ALT: 18 U/L (ref 0–44)
AST: 16 U/L (ref 15–41)
Albumin: 3.7 g/dL (ref 3.5–5.0)
Alkaline Phosphatase: 72 U/L (ref 38–126)
Anion gap: 9 (ref 5–15)
BUN: 17 mg/dL (ref 8–23)
CO2: 25 mmol/L (ref 22–32)
Calcium: 11 mg/dL — ABNORMAL HIGH (ref 8.9–10.3)
Chloride: 109 mmol/L (ref 98–111)
Creatinine: 0.82 mg/dL (ref 0.61–1.24)
GFR, Est AFR Am: >60 mL/min (ref >60)
GFR, Estimated: >60 mL/min (ref >60)
Glucose, Bld: 108 mg/dL — ABNORMAL HIGH (ref 70–99)
Potassium: 4.4 mmol/L (ref 3.5–5.1)
Sodium: 143 mmol/L (ref 135–145)
Total Bilirubin: 0.8 mg/dL (ref 0.3–1.2)
Total Protein: 6.4 g/dL — ABNORMAL LOW (ref 6.5–8.1)

## 2018-03-28 NOTE — Telephone Encounter (Addendum)
Patient contacted per Delsa Sale at Dr. Everrett Coombe office ([facility]) to schedule CPX. Attempted to schedule CPX this week with patient's limited availability. Patient is having back procedure on Monday, further limiting his availability as time for recovery and restrictions are unknown. CPX requiring clearance from [facility]. Patient provided myself with Physician's name (Dr. Maia Petties) and office contact number. Left message on nurse line at [facility] specialist regarding this matter, with request for returned call for clearance. Waiting for response before scheduling patient for CPX ordered by Dr. Servando Snare. Patient aware and agrees to the above.    Landis Martins, MS, ACSM-RCEP Clinical Exercise Physiologist

## 2018-03-28 NOTE — Progress Notes (Signed)
[place]   [phone] [phone]) [phone]   Clinic Follow up Note   Patient Care Team: [name], MD as PCP - General (Family Medicine) Mansouraty, [name], MD as Consulting Physician (Gastroenterology)  Date of Service:  03/28/2018  CHIEF COMPLAINT: Follow up esophageal cancer  SUMMARY OF ONCOLOGIC HISTORY: Oncology History   Cancer Staging Malignant neoplasm of gastroesophageal junction [facility]) Staging form: Esophagus - Adenocarcinoma, AJCC 8th Edition - Clinical stage from 02/26/2018: Stage IIB (cT2, cN0, cM0, G3) - Signed by [name], NP on 02/26/2018      Malignant neoplasm of gastroesophageal junction ([place])   01/22/2018 Initial Biopsy    Diagnosis Surgical [P], GE junction nodule BX - POORLY 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    01/22/2018 Procedure    COLONOSCOPY IMPRESSION - Redundant colon. - The examination was otherwise normal on direct and retroflexion views. - No specimens collected.  UPPER ENDOSCOPY IMPRESSION: - Esophageal mucosal changes secondary to established short-segment Barrett's disease. - Mucosal nodule found in the esophagus. Biopsied. - Erythematous mucosa in the prepyloric region of the stomach. - Submucosal nodule found in the duodenum.     02/01/2018 Initial Diagnosis    Malignant neoplasm of gastroesophageal junction ([place])    02/05/2018 Imaging    CT CAP IMPRESSION: 1. No esophageal primary identified. No findings of metastatic disease in the chest, abdomen, or pelvis. 2.  Aortic Atherosclerosis (ICD10-I70.0).    02/12/2018 Procedure    EUS Impression: - A mass was found in the distal esophagus into the gastroesophageal junction and extending to proximal cardia. A tissue diagnosis was obtained prior to this exam consistent with adenocarcinoma. This was staged at T2N0Mx, because of a loss of interface of the muscularis propria as the  distal GE Junction becomes the cardia and the mass moves into this region, but there are many more regions and images that suggest a T1 lesion into the submucosa.    02/26/2018 Cancer Staging    Staging form: Esophagus - Adenocarcinoma, AJCC 8th Edition - Clinical stage from 02/26/2018: Stage IIB (cT2, cN0, cM0, G3) - Signed by [name], NP on 02/26/2018      CURRENT THERAPY:  PENDING Surgery   INTERVAL HISTORY:  Philip [name] is here for a follow up to discuss surgical clearance. He presents to the clinic today by himself. He notes he saw Dr. Eston Mould 2 days ago at [facility]. He notes if Dr. Servando Snare is not able to do surgery Dr. Eston Mould can. He is waiting to complete stress test with cardiologist, Dr. Debara Pickett, to be cleared for surgery. He plans to have back surgery next week.  He notes he is getting help traveling to visit to visit. He denies dysphagia and otherwise remains asymptomatic.   REVIEW OF SYSTEMS:   Constitutional: Denies fevers, chills or abnormal weight loss Eyes: Denies blurriness of vision Ears, nose, mouth, throat, and face: Denies mucositis or sore throat Respiratory: Denies cough, dyspnea or wheezes Cardiovascular: Denies palpitation, chest discomfort or lower extremity swelling Gastrointestinal: Denies nausea, heartburn or change in bowel habits Skin: Denies abnormal skin rashes Lymphatics: Denies new lymphadenopathy or easy bruising Neurological:Denies numbness, tingling or new weaknesses Behavioral/Psych: Mood is stable, no new changes  All other systems were reviewed with the patient and are negative.  MEDICAL HISTORY:  Past Medical History:  Diagnosis Date  . Abdominal hernia   . Anemia   . Asthma   . Barrett's esophagus   .  Cancer (HCC)    Esophagel cancer  . Cataract    Bil surgery  . Cough    over 2 weeks  . Fluid retention in legs   . GERD (gastroesophageal reflux disease)   . H/O hiatal hernia   . Hypertension   . Myasthenia gravis ([place])   .  Sleep apnea    don't use c-pap at home/ tried for several years  . SOB (shortness of breath)     SURGICAL HISTORY: Past Surgical History:  Procedure Laterality Date  . BALLOON DILATION N/A 10/19/2016   Procedure: BALLOON DILATION;  Surgeon: Ladene Artist, MD;  Location: [facility];  Service: Endoscopy;  Laterality: N/A;  . COLONOSCOPY W/ POLYPECTOMY    . ESOPHAGOGASTRODUODENOSCOPY N/A 10/19/2016   Procedure: ESOPHAGOGASTRODUODENOSCOPY (EGD);  Surgeon: Ladene Artist, MD;  Location: [facility];  Service: Endoscopy;  Laterality: N/A;  . ESOPHAGOGASTRODUODENOSCOPY (EGD) WITH PROPOFOL N/A 02/12/2018   Procedure: ESOPHAGOGASTRODUODENOSCOPY (EGD) WITH PROPOFOL;  Surgeon: [name], MD;  Location: [facility];  Service: Gastroenterology;  Laterality: N/A;  . EUS N/A 02/12/2018   Procedure: UPPER ENDOSCOPIC ULTRASOUND (EUS) RADIAL;  Surgeon: [name], MD;  Location: [place];  Service: Gastroenterology;  Laterality: N/A;  . EYE SURGERY Bilateral    cataract  . LUMBAR LAMINECTOMY     1980's    I have reviewed the social history and family history with the patient and they are unchanged from previous note.  ALLERGIES:  has No Known Allergies.  MEDICATIONS:  Current Outpatient Medications  Medication Sig Dispense Refill  . ALBUTEROL IN Inhale 2 puffs into the lungs every 4 (four) hours as needed (shortness of breath or wheezing).     [name] amitriptyline (ELAVIL) 25 MG tablet Take 25 mg by mouth daily.     [name] aspirin EC 81 MG tablet Take 81 mg by mouth daily.    [name] atorvastatin (LIPITOR) 10 MG tablet Take 10 mg by mouth at bedtime.     . diclofenac (VOLTAREN) 75 MG EC tablet Take 75 mg by mouth daily as needed.     . Esomeprazole Magnesium 20 MG TBEC Take 40 mg by mouth daily at 12 noon.     [name] lisinopril (PRINIVIL,ZESTRIL) 20 MG tablet Take 20 mg by mouth daily.    . Multiple Vitamin (MULTIVITAMIN) tablet Take 1 tablet by mouth daily.    . mycophenolate  (CELLCEPT) 500 MG tablet Take 2 tablets (1,000 mg total) by mouth 2 (two) times daily. 120 tablet 12  . traMADol (ULTRAM) 50 MG tablet Take 50 mg by mouth every 6 (six) hours as needed for pain.     No current facility-administered medications for this visit.     PHYSICAL EXAMINATION: ECOG PERFORMANCE STATUS: 1 - Symptomatic but completely ambulatory  Vitals:   03/28/18 1529  BP: (!) 159/76  Pulse: 91  Resp: 20  Temp: 98.2 F (36.8 C)  SpO2: 98%   Filed Weights   03/28/18 1529  Weight: 299 lb 3.2 oz (135.7 kg)    GENERAL:alert, no distress and comfortable SKIN: skin color, texture, turgor are normal, no rashes or significant lesions EYES: normal, Conjunctiva are pink and non-injected, sclera clear OROPHARYNX:no exudate, no erythema and lips, buccal mucosa, and tongue normal  NECK: supple, thyroid normal size, non-tender, without nodularity LYMPH:  no palpable lymphadenopathy in the cervical, axillary or inguinal LUNGS: clear to auscultation and percussion with normal breathing effort HEART: regular rate & rhythm and no murmurs and no lower extremity edema  ABDOMEN:abdomen soft, non-tender and normal bowel sounds Musculoskeletal:no cyanosis of digits and no clubbing  NEURO: alert & oriented with fluent speech, no focal motor/sensory deficits  LABORATORY DATA:  I have reviewed the data as listed CBC Latest Ref Rng & Units 03/28/2018 02/26/2018 12/04/2017  WBC 4.0 - 10.5 K/uL 8.1 7.2 6.8  Hemoglobin 13.0 - 17.0 g/dL 13.8 14.2 12.9(L)  Hematocrit 39.0 - 52.0 % 41.8 42.3 38.0  Platelets 150 - 400 K/uL 209 216 217     CMP Latest Ref Rng & Units 03/28/2018 02/26/2018 01/31/2018  Glucose 70 - 99 mg/dL 108(H) 100(H) -  BUN 8 - 23 mg/dL 17 16 17   Creatinine 0.61 - 1.24 mg/dL 0.82 0.93 0.87  Sodium 135 - 145 mmol/L 143 138 -  Potassium 3.5 - 5.1 mmol/L 4.4 4.0 -  Chloride 98 - 111 mmol/L 109 107 -  CO2 22 - 32 mmol/L 25 24 -  Calcium 8.9 - 10.3 mg/dL 11.0(H) 11.5(H) -  Total  Protein 6.5 - 8.1 g/dL 6.4(L) 6.7 -  Total Bilirubin 0.3 - 1.2 mg/dL 0.8 0.8 -  Alkaline Phos 38 - 126 U/L 72 80 -  AST 15 - 41 U/L 16 16 -  ALT 0 - 44 U/L 18 16 -      RADIOGRAPHIC STUDIES: I have personally reviewed the radiological images as listed and agreed with the findings in the report. No results found.   ASSESSMENT & PLAN:  Philip [name] is a 73 y.o. male with   1. 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 He has a small mass at the GE junction in the setting of barrett's esophagus; he is asymptomatic.  -He was seen by thoracic surgeon Dr. Servando Snare, and felt to be not great candidate for surgical resection due to these medical comorbidities, and he was referred to Dr. Eston Mould at [facility] for second opinion on endoscopical resection. Dr. Eston Mould feels due to  T2 disease, surgical resection is better, if no surgery then she will consider endoscopical resection and close monitoring  -In order to proceed with surgery he needs stress test and for his cardiologist Dr. Debara Pickett to clear. This has not been set. I strongly encouraged him to make sure this process moves forward as his cancer will keep growing.  -for his early stage esophageal cancer, chemoradiation is not recommended if he can undergo resection.  -Labs reviewed and normal, except BG at 108 and Ca at 11. CEA is still pending. -I will follow up with him after surgery. F/u open     2. Anemia -He had mild normocytic anemia at initial visit. Currently resolved.    PLAN: -F/u open, I will see him back as needed.    No problem-specific Assessment & Plan notes found for this encounter.   No orders of the defined types were placed in this encounter.  All questions were answered. The patient knows to call the clinic with any problems, questions or concerns. No barriers to learning was detected. I spent 15 minutes counseling the patient  face to face. The total time spent in the appointment was 20 minutes and more than 50% was on counseling and review of test results     Truitt Merle, MD 03/28/2018   I, Joslyn Devon, am acting as scribe for Truitt Merle, MD.   I have reviewed the above documentation for accuracy and completeness, and I agree with the above.

## 2018-03-30 ENCOUNTER — Encounter: Payer: Self-pay | Admitting: Hematology

## 2018-03-30 LAB — CEA (IN HOUSE-CHCC): CEA (CHCC-In House): 3.19 ng/mL (ref 0.00–5.00)

## 2018-04-02 ENCOUNTER — Telehealth: Payer: Self-pay

## 2018-04-02 ENCOUNTER — Telehealth (HOSPITAL_COMMUNITY): Payer: Self-pay | Admitting: *Deleted

## 2018-04-02 NOTE — Telephone Encounter (Signed)
Spoke with patient per Philip Rue NP notified him tumor marker is normal.  He has questions regarding treatment specially how long he would have to have chemotherapy.

## 2018-04-02 NOTE — Telephone Encounter (Signed)
Contacted Dr. Henriette Combs office at [facility] regarding clearance for CPX study after back procedure scheduled 04/02/18---- information provided by patient. Left two voicemails at this office for restrictions and recovery from unspecified procedure scheduled. No returned phone call. Attempted call again at [facility] on 04/02/18 at 0830 with returned call at 11:00 with instructions to proceed with exercise study (CPX) with 3 days between back procedure and exercise. Patient contacted with plan to schedule CPX fro 04/06/18 at 11am or 04/10/18 at 11am. Patient stated physician has no restrictions for exercise, therefore patient will be scheduled for CPX 04/04/18 at 1400, with review and risk stratification prior to exercise on this day. Patient was provided instructions to arrive at [facility] and proceed through the main entrance to admitting. Exercise study specifications and how to prepare shared with patient. Patient's questions were answered and agreed with the stated plan above.    Landis Martins, MS, ACSM-RCEP Clinical Exercise Physiologist

## 2018-04-02 NOTE — Telephone Encounter (Signed)
-----   Message from [name], NP sent at 03/30/2018  3:38 PM EST ----- Please let him know tumor marker is normal. Thanks, Regan Rakers

## 2018-04-03 NOTE — Telephone Encounter (Signed)
It's my understanding he is still being worked up/cleared for surgery. He has f/u with Dr. Servando Snare on 12/9. Please have him call us after that. If he is not a surgical candidate, we can discuss other treatment options such as chemoradiation.  Thanks, Regan Rakers

## 2018-04-04 ENCOUNTER — Encounter (HOSPITAL_COMMUNITY): Payer: Medicare Other

## 2018-04-06 ENCOUNTER — Ambulatory Visit: Payer: Medicare Other | Admitting: Cardiothoracic Surgery

## 2018-04-06 ENCOUNTER — Ambulatory Visit (HOSPITAL_COMMUNITY): Payer: Medicare Other | Attending: Cardiothoracic Surgery

## 2018-04-06 ENCOUNTER — Other Ambulatory Visit (HOSPITAL_COMMUNITY): Payer: Self-pay | Admitting: *Deleted

## 2018-04-06 DIAGNOSIS — C160 Malignant neoplasm of cardia: Secondary | ICD-10-CM | POA: Diagnosis present

## 2018-04-06 DIAGNOSIS — Z01818 Encounter for other preprocedural examination: Secondary | ICD-10-CM

## 2018-04-06 DIAGNOSIS — R0602 Shortness of breath: Secondary | ICD-10-CM | POA: Insufficient documentation

## 2018-04-06 DIAGNOSIS — Z01810 Encounter for preprocedural cardiovascular examination: Secondary | ICD-10-CM | POA: Insufficient documentation

## 2018-04-09 ENCOUNTER — Encounter: Payer: Self-pay | Admitting: Cardiothoracic Surgery

## 2018-04-09 ENCOUNTER — Other Ambulatory Visit: Payer: Self-pay

## 2018-04-09 ENCOUNTER — Ambulatory Visit (INDEPENDENT_AMBULATORY_CARE_PROVIDER_SITE_OTHER): Payer: Medicare Other | Admitting: Cardiothoracic Surgery

## 2018-04-09 VITALS — BP 123/73 | HR 100 | Resp 18 | Ht 73.0 in | Wt 299.0 lb

## 2018-04-09 DIAGNOSIS — C160 Malignant neoplasm of cardia: Secondary | ICD-10-CM | POA: Diagnosis not present

## 2018-04-09 DIAGNOSIS — Z01811 Encounter for preprocedural respiratory examination: Secondary | ICD-10-CM

## 2018-04-09 NOTE — Progress Notes (Signed)
[address]       [place] [address]             [place] Record #[id number] Date of Birth: Sep 24, 1945  Referring: Shellia Carwin, PA-C Primary Care: [name], MD Primary Cardiologist: No primary care provider on file.  Chief Complaint:    Chief Complaint  Patient presents with  . Esophageal Cancer    f/u after Cardiopulmonary exercise test 04/06/18    History of Present Illness:    Philip [name] 72 y.o. 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  Patient has had a known history of Barrett's esophagus from previous endoscopies follow-up endoscopy done recently led to a positive biopsy for adenocarcinoma.  Subsequent EUS was performed.  Patient was seen by medical oncology and referred for surgical evaluation. He has also been seen by [facility]. He is a distant smoker briefly and quit at the age of 84.   The patient returns to the office today to further discuss risks and options of surgical treatment.  He was also referred to [facility] for consideration of endoscopic submucosal resection.   "PLAN - Recommend surgical therapy given tumor stage and poor differentiation - Please follow-up with Dr. Servando Snare regarding candidacy for surgical therapy. If not deemed to be a surgical candidate will evaluate proceeding with endoscopic therapy although this may not be curative  Renford Dills, MD [facility]"        Currently on CellCept  for myasthenia gravis.   Patient has seen cardiology   followed by Neurology for recently diagnosed myasthenia gravis-January 2019 UPDATE (12/04/17, VRP): Since last visit, doing well. Symptoms are resolved. Tolerating cellcept and mestinon. No more double vision. No generalized fatigue.   UPDATE (03/13/17, VRP): Since last visit, doing well. IVIG helped quite a bit. Tolerating pyridostigmine. No  alleviating or aggravating factors. No generalized weakness but notes some non-specific "tired" feeling. No double vision or drooping eyelids.   UPDATE (01/17/17, VRP): Since last visit, doing well on pyridostigmine. It worked the next day. AchR ab are positive. Some generalized fatigue and weakness are noted and new since last visit. Went to ER for laryngospasm (?upper resp infx) and tx'd with 4 days prednisone and oral lidocaine.    PRIOR HPI (12/21/16): 72 year old male here for evaluation of double vision. Her past to 3 weeks patient has had intermittent double vision.  Symptoms present with both eyes open but improve if he closes one eye or uses an eye patch. He describes objects as being up and to the side from each other.  They slightly overlap.  This fluctuates.  Symptoms worse later in the day.  He also had some swallowing difficulties for the past 2 months without a specific cause to be found. He has some generalized fatigue as well. Patient went to [facility] eye clinic, with no specific ocular pathology found.  Patient was referred here for consideration of possible neuromuscular, neuro inflammatory or neuro vascular cause of symptoms. No unilateral numbness or weakness.  No sudden strokelike symptoms.  No headaches.  No prodromal factors.     Current Activity/ Functional Status:  Patient is independent with mobility/ambulation, transfers, ADL's, IADL's.   Zubrod Score: At the time of surgery this patient's most  appropriate activity status/level should be described as: []     0    Normal activity, no symptoms [x]     1    Restricted in physical strenuous activity but ambulatory, able to do out light work []     2    Ambulatory and capable of self care, unable to do work activities, up and about               >50 % of waking hours                              []     3    Only limited self care, in bed greater than 50% of waking hours []     4    Completely disabled, no self care, confined to bed  or chair []     5    Moribund   Past Medical History:  Diagnosis Date  . Abdominal hernia   . Anemia   . Asthma   . Barrett's esophagus   . Cancer (HCC)    Esophagel cancer  . Cataract    Bil surgery  . Cough    over 2 weeks  . Fluid retention in legs   . GERD (gastroesophageal reflux disease)   . H/O hiatal hernia   . Hypertension   . Myasthenia gravis ([place])   . Sleep apnea    don't use c-pap at home/ tried for several years  . SOB (shortness of breath)     Past Surgical History:  Procedure Laterality Date  . BALLOON DILATION N/A 10/19/2016   Procedure: BALLOON DILATION;  Surgeon: Ladene Artist, MD;  Location: [facility];  Service: Endoscopy;  Laterality: N/A;  . COLONOSCOPY W/ POLYPECTOMY    . ESOPHAGOGASTRODUODENOSCOPY N/A 10/19/2016   Procedure: ESOPHAGOGASTRODUODENOSCOPY (EGD);  Surgeon: Ladene Artist, MD;  Location: [facility];  Service: Endoscopy;  Laterality: N/A;  . ESOPHAGOGASTRODUODENOSCOPY (EGD) WITH PROPOFOL N/A 02/12/2018   Procedure: ESOPHAGOGASTRODUODENOSCOPY (EGD) WITH PROPOFOL;  Surgeon: [name], MD;  Location: [facility];  Service: Gastroenterology;  Laterality: N/A;  . EUS N/A 02/12/2018   Procedure: UPPER ENDOSCOPIC ULTRASOUND (EUS) RADIAL;  Surgeon: [name], MD;  Location: [facility];  Service: Gastroenterology;  Laterality: N/A;  . EYE SURGERY Bilateral    cataract  . LUMBAR LAMINECTOMY     1980's    Family History  Problem Relation Age of Onset  . Heart disease Mother   . Lung cancer Father   . Colon cancer Neg Hx      Social History   Tobacco Use  Smoking Status Former Smoker  . Packs/day: 1.00  . Years: 7.00  . Pack years: 7.00  . Last attempt to quit: 12/21/1976  . Years since quitting: 41.3  Smokeless Tobacco Never Used    Social History   Substance and Sexual Activity  Alcohol Use Not Currently   Comment: ocassional     No Known Allergies  Current Outpatient Medications    Medication Sig Dispense Refill  . ALBUTEROL IN Inhale 2 puffs into the lungs every 4 (four) hours as needed (shortness of breath or wheezing).     [name] amitriptyline (ELAVIL) 25 MG tablet Take 25 mg by mouth daily.     [name] aspirin EC 81 MG tablet Take 81 mg by mouth daily.    [name] atorvastatin (LIPITOR) 10 MG tablet Take 10 mg by mouth at bedtime.     . diclofenac (  VOLTAREN) 75 MG EC tablet Take 75 mg by mouth daily as needed.     . Esomeprazole Magnesium 20 MG TBEC Take 40 mg by mouth daily at 12 noon.     [name] lisinopril (PRINIVIL,ZESTRIL) 20 MG tablet Take 20 mg by mouth daily.    . Multiple Vitamin (MULTIVITAMIN) tablet Take 1 tablet by mouth daily.    . mycophenolate (CELLCEPT) 500 MG tablet Take 2 tablets (1,000 mg total) by mouth 2 (two) times daily. 120 tablet 12  . traMADol (ULTRAM) 50 MG tablet Take 50 mg by mouth every 6 (six) hours as needed for pain.     No current facility-administered medications for this visit.     Pertinent items are noted in HPI.   Review of Systems:     Cardiac Review of Systems: [Y] = yes  or   [ N ] = no   Chest Pain Aqua.Slicker  ]  Resting SOB Jazmín.Cullens ] Exertional SOB  [ Y ]  Orthopnea Aqua.Slicker ]   Pedal Edema [ Y  ]    Palpitations [ N] Syncope  Aqua.Slicker ]   Presyncope [ N ]   General Review of Systems: [Y] = yes [  ]=no Constitional: recent weight change [n  ];  Wt loss over the last 3 months [   ] anorexia [  ]; fatigue [  ]; nausea [  ]; night sweats [  ]; fever [  ]; or chills [  ];           Eye : blurred vision [  ]; diplopia [   ]; vision changes [  ];  Amaurosis fugax[  ]; Resp: cough [  ];  wheezing[  ];  hemoptysis[  ]; shortness of breath[  ]; paroxysmal nocturnal dyspnea[  ]; dyspnea on exertion[y ]; or orthopnea[  ];  GI:  gallstones[  ], vomiting[  ];  dysphagia[  ]; melena[  ];  hematochezia [  ]; heartburn[  ];   Hx of  Colonoscopy[  ]; GU: kidney stones [  ]; hematuria[  ];   dysuria [  ];  nocturia[  ];  history of     obstruction [  ]; urinary frequency [  ]              Skin: rash, swelling[  ];, hair loss[  ];  peripheral edema[  ];  or itching[  ]; Musculosketetal: myalgias[  ];  joint swelling[  ];  joint erythema[  ];  joint pain[  ];  back pain[  ];  Heme/Lymph: bruising[  ];  bleeding[  ];  anemia[  ];  Neuro: TIA[  ];  headaches[  ];  stroke[  ];  vertigo[  ];  seizures[  ];   paresthesias[  ];  difficulty walking[ Y; double vision   Psych:depression[  ]; anxiety[  ];  Endocrine: diabetes[  ];  thyroid dysfunction[  ];  Immunizations: Flu up to date [  ]; Pneumococcal up to date [  ];  Other:     PHYSICAL EXAMINATION: BP 123/73 (BP Location: Left Arm, Patient Position: Sitting, Cuff Size: Large)   Pulse 100   Resp 18   Ht 6\' 1"  (1.854 m)   Wt 299 lb (135.6 kg)   SpO2 96% Comment: RA  BMI 39.45 kg/m  General appearance: alert, cooperative and no distress Head: Normocephalic, without obvious abnormality, atraumatic Neck: no adenopathy, no carotid bruit, no JVD, supple, symmetrical, trachea midline and thyroid  not enlarged, symmetric, no tenderness/mass/nodules Lymph nodes: Cervical, supraclavicular, and axillary nodes normal. Resp: clear to auscultation bilaterally Back: symmetric, no curvature. ROM normal. No CVA tenderness. Cardio: regular rate and rhythm, S1, S2 normal, no murmur, click, rub or gallop GI: soft, non-tender; bowel sounds normal; no masses,  no organomegaly Extremities: extremities normal, atraumatic, no cyanosis or edema and Homans sign is negative, no sign of DVT Neurologic: Grossly normal  Diagnostic Studies & Laboratory data:     Recent Radiology Findings:     Ct chest  Abdomen Pelvis W Contrast  Result Date: 02/06/2018 CLINICAL DATA:  Esophageal adenocarcinoma. Biopsy on 01/22/2018 demonstrating poorly differentiated adenocarcinoma at GE junction. EXAM: CT CHEST, ABDOMEN, AND PELVIS WITH CONTRAST TECHNIQUE: Multidetector CT imaging of the chest, abdomen and pelvis was performed following the standard protocol  during bolus administration of intravenous contrast. CONTRAST:  160mL ISOVUE-300 IOPAMIDOL (ISOVUE-300) INJECTION 61% COMPARISON:  Chest CT 01/19/2017.  No prior abdominopelvic CTs. FINDINGS: CT CHEST FINDINGS Cardiovascular: Bovine arch. Normal heart size, without pericardial effusion. Lipomatous hypertrophy of the interatrial septum. No central pulmonary embolism, on this non-dedicated study. Mediastinum/Nodes: No supraclavicular adenopathy. No mediastinal or hilar adenopathy. Normal appearance of the esophagus. Lungs/Pleura: No pleural fluid. The previously described 4 mm lingular nodule is no longer identified. Musculoskeletal: No acute osseous abnormality. CT ABDOMEN PELVIS FINDINGS Hepatobiliary: Normal liver. Normal gallbladder, without biliary ductal dilatation. Pancreas: Normal, without mass or ductal dilatation. Spleen: Normal in size, without focal abnormality. Adrenals/Urinary Tract: Normal adrenal glands. Normal kidneys, without hydronephrosis. Normal urinary bladder. Stomach/Bowel: Normal stomach, without wall thickening. Scattered colonic diverticula. Normal terminal ileum. Nonobstructive 12 mm lipoma within the inferior genu of the duodenum on coronal image 92 and transverse image 80/2. Otherwise normal small bowel. Vascular/Lymphatic: Aortic atherosclerosis. No abdominopelvic adenopathy. Reproductive: Normal prostate. Other: No significant free fluid. A tiny fat containing left inguinal hernia. No evidence of omental or peritoneal disease. Musculoskeletal: No acute osseous abnormality. Thoracolumbar spondylosis. IMPRESSION: 1. No esophageal primary identified. No findings of metastatic disease in the chest, abdomen, or pelvis. 2.  Aortic Atherosclerosis (ICD10-I70.0). Electronically Signed   By: Abigail Miyamoto M.D.   On: 02/06/2018 11:13     I have independently reviewed the above radiology studies  and reviewed the findings with the patient.   Recent Lab Findings: Lab Results  Component Value  Date   WBC 8.1 03/28/2018   HGB 13.8 03/28/2018   HCT 41.8 03/28/2018   PLT 209 03/28/2018   GLUCOSE 108 (H) 03/28/2018   ALT 18 03/28/2018   AST 16 03/28/2018   NA 143 03/28/2018   K 4.4 03/28/2018   CL 109 03/28/2018   CREATININE 0.82 03/28/2018   BUN 17 03/28/2018   CO2 25 03/28/2018   TSH 2.040 12/21/2016   HGBA1C 5.4 12/21/2016  EGD: There were esophageal mucosal changes secondary to established short-segment Barrett's disease present at the gastroesophageal junction. The maximum longitudinal extent of these mucosal changes was 1 cm in length. Findings: - A single small, inflamed mucosal nodule was found at the gastroesophageal junction within the Barrett's esophagus. Biopsies were taken with a cold forceps for histology. - Patchy mildly erythematous mucosa without bleeding was found in the prepyloric region of the stomach. - The cardia and gastric fundus were normal on retroflexion. - A single 12 mm submucosal nodule was found in the second portion of the duodenum. - The exam of the duodenum was otherwise normal.  Esophageal mucosal changes secondary to established short-segment Barrett's disease. - Mucosal nodule  found in the esophagus. Biopsied. - Erythematous mucosa in the prepyloric region of the stomach. - Submucosal nodule found in the duodenum. Impression: - Patient has a contact number available for emergencies. The signs and symptoms of potential delayed complications were discussed with the patient. Return to normal activities tomorrow. Written discharge instructions were provided to the patient. - Resume previous diet. - Continue present medications. - Await pathology results. - See the other procedure note for documentation of additional recommendations.     EUS: by Dr Justice Britain  EGD Impression: - No gross lesions in proximal/middle esophagus. - Friable (with spontaneous bleeding), granular, inflamed, ulcerated mucosa in the esophagus -  consistent with previously noted biopsies with adenocarcinoma at the distal esophagus/GEJxn/proximal cardia. - Nodular mucosa in the gastric antrum. - No other gross lesions in the stomach. - Submucosal nodule found in the duodenum. EUS Impression: - A mass was found in the distal esophagus into the gastroesophageal junction and extending to proximal cardia. A tissue diagnosis was obtained prior to this exam consistent with adenocarcinoma. This was staged at T2N0Mx, because of a loss of interface of the muscularis propria as the distal GE Junction becomes the cardia and the mass moves into this region, but there are many more regions and images that suggest a T1 lesion into the submucosa. - No malignant-appearing lymph nodes were visualized in the anterior mediastinum (level 6), subcarinal mediastinum (level 7), middle paraesophageal mediastinum (level 30M), lower paraesophageal mediastinum (level 8L), diaphragmatic region (level 15), mediastinal periaortic region, paracardial region (level 16), left gastric region (level 17) and gastrohepatic ligament (level 18). Impression: esophagus/GEJxn/proximal cardia. - Nodular mucosa in the gastric antrum. - No other gross lesions in the stomach. - Submucosal nodule found in the duodenum. EUS Impression: - A mass was found in the distal esophagus into the gastroesophageal junction and extending to proximal cardia. A tissue diagnosis was obtained prior to this exam consistent with adenocarcinoma. This was staged at T2N0Mx, because of a loss of interface of the muscularis propria as the distal GE Junction becomes the cardia and the mass moves into this region, but there are many more regions and images that suggest a T1 lesion into the submucosa. - No malignant-appearing lymph nodes were visualized in the anterior mediastinum (level 6), subcarinal mediastinum (level 7), middle paraesophageal mediastinum (level 30M), lower paraesophageal mediastinum  (level 8L), diaphragmatic region (level 15), mediastinal periaortic region, paracardial region (level 16), left gastric region (level 17) and  Diagnosis [id number] [facility]   Surgical [P], GE junction nodule BX - POORLY 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  6 min walk test- Walked 784 feet Borg 3 sat to 93%    Referred for: Risk Stratification- lung resection CPX testing  Procedure: This patient underwent staged symptom-limited exercise testing using a individualized bike protocol with expired gas analysis metabolic evaluation during exercise.  Demographics  Age: 78 Ht. (in.) 49 Wt. (lb) 299 BMI: 39.4   Predicted Peak VO2: 17.6  Gender: Male Ht (cm) 185.6 Wt. (kg) 135.6    Results  Pre-Exercise PFTs   FVC 2.73 (58%)     FEV1 2.30 (65%)      FEV1/FVC 84 (111%)      MVV 104 (75%)      Exercise Time:  9:15   Watts: 108  RPE: 17  Reason stopped: dyspnea (9/10)  Additional symptoms: None reported  Resting HR: 95 Peak HR: 132  (89% age predicted max HR)  BP rest:  142/70 BP peak: 216/74  Peak VO2: 15.1 (86% predicted peak VO2)  VE/VCO2 slope: 33  OUES: 2.39  Peak RER: 1.09  Ventilatory Threshold: 11.0 (63% predicted or 73% measured peak VO2)  Peak RR 40  Peak Ventilation: 76.5  VE/MVV: 74%  PETCO2 at peak: 32  O2pulse: 12  (75% predicted O2pulse)   Interpretation  Notes: Patient gave a very good effort. Pulse-oximetry remained 96-98% for the duration of exercise. Exercise was performed on a cycle ergometer starting at [facility] and increasing by 12W/min.  ECG: Resting ECG in sinus rhythm with incomplete RBBB. HR response appropriate. There were no sustained arrhythmias or ST-T changes noted. BP hypertensive at peak exercise.   PFT: Pre-exercise spirometry demonstrates moderate restrictive properties. MVV normal  CPX: Exercise testing with gas exchange  demonstrates a low normal peak VO2 of 15.1 ml/kg/min (86% of the age/gender/weight matched sedentary norms). The RER of 1.09 indicates a near maximal effort. When adjusted to the patient's ideal body weight of 189.1lb (85.8 kg) the peak VO2 is 23.9 ml/kg (ibw)/min (98% of the ibw-adjusted predicted). The VE/VCO2 slope is mildly elevated and indicates increased dead space ventilation. The oxygen uptake efficiency slope (OUES) is normal. The VO2 at the ventilatory threshold was normal at 63% of the predicted peak VO2 and 73% measured PVO2. At peak exercise, the ventilation reached 74% of the measured MVV indicating ventilatory reserve was nearly depleted. The O2pulse (a surrogate for stroke volume) increased with incremental exercise, reaching blunted peak of 12 ml/beat (75% predicted).    Conclusion: Exercise testing with gas exchange demonstrates normal functional capacity when compared to matched sedentary norms. There is no obvious cardiopulmonary limitations. PVO2 indicates minimal risk for peri-operative complications. Note, there was a hypertensive response to exercise, with moderate restrictive properties measured duriing pre-exercise spirometry indicating patient is ventilatory limited to exercise due to his body habitus.    Test, report and preliminary impression by: Landis Martins, MS, ACSM-RCEP 04/06/2018 3:57 PM  Agree with above. Low normal functional capacity when compared to matched controls. No obvious cardiac limitation. When corrected for the patient's obesity the functional capacity is normal. Resting spirometry shows moderate restriction and at peak exercise the patient is limited by his obesity and restricted ventilation. There was a hypertensive response to exercise.    Glori Bickers, MD  7:43 AM  Transthoracic Echocardiography  Patient:    Philip [name], Philip [name] MR #:       [id number] Study Date: 02/27/2018 Gender:     M Age:        64 Height:     182.9 cm Weight:     131.3  kg BSA:        2.64 m^2 Pt. Status: Room:   SONOGRAPHER  Blondell Reveal  ATTENDING    Lyman Bishop MD  [place] MD  REFERRING    Lyman Bishop MD  PERFORMING   [facility], Outpatient  cc:  ------------------------------------------------------------------- LV EF: 60% -   65%  ------------------------------------------------------------------- Indications:      (R06.02).  ------------------------------------------------------------------- History:   PMH:  Pre-op evaluation. Acquired from the patient and from the patient&'s chart.  Dyspnea.  Risk factors:  Hypertension. Obese.  ------------------------------------------------------------------- Study Conclusions  - Left ventricle: The cavity size was normal. Wall thickness was   increased in a pattern of moderate LVH. Systolic function was   normal. The estimated ejection fraction was in the range of 60%   to 65%. Wall motion was normal; there were no  regional wall   motion abnormalities. Doppler parameters are consistent with   abnormal left ventricular relaxation (grade 1 diastolic   dysfunction).  Impressions:  - The echo is technically difficult with poor images.   The valves are not seen well.  ------------------------------------------------------------------- Study data:  No prior study was available for comparison.  Study status:  Routine.  Procedure:  The patient reported no pain pre or post test. Transthoracic echocardiography for left ventricular function evaluation. Image quality was poor. The study was technically difficult, as a result of poor acoustic windows, poor sound wave transmission, and body habitus. Intravenous contrast (Definity) was administered to opacify the LV.  Study completion: There were no complications.          Transthoracic echocardiography.  M-mode, complete 2D, spectral Doppler, and color Doppler.  Birthdate:  Patient birthdate: Nov 30, 1945.  Age:  Patient is  72 yr old.  Sex:  Gender: male.    BMI: 39.2 kg/m^2.  Blood pressure:     120/58  Patient status:  Outpatient.  Study date: Study date: 02/27/2018. Study time: 01:09 PM.  Location:  [facility]  -------------------------------------------------------------------  ------------------------------------------------------------------- Left ventricle:  The cavity size was normal. Wall thickness was increased in a pattern of moderate LVH. Systolic function was normal. The estimated ejection fraction was in the range of 60% to 65%. Wall motion was normal; there were no regional wall motion abnormalities. Doppler parameters are consistent with abnormal left ventricular relaxation (grade 1 diastolic dysfunction).  ------------------------------------------------------------------- Aortic valve:  Poorly visualized.  Doppler:  There was no regurgitation.  ------------------------------------------------------------------- Mitral valve:  Poorly visualized.  Doppler:  There was no significant regurgitation.    Valve area by pressure half-time: 2.93 cm^2. Indexed valve area by pressure half-time: 1.11 cm^2/m^2.   ------------------------------------------------------------------- Left atrium:  The atrium was normal in size.  ------------------------------------------------------------------- Right ventricle:  The cavity size was normal. Systolic function was normal.  ------------------------------------------------------------------- Pulmonic valve:    The valve appears to be grossly normal. Doppler:  There was no significant regurgitation.  ------------------------------------------------------------------- Pericardium:  There was no pericardial effusion.  ------------------------------------------------------------------- Measurements   Left ventricle                         Value          Reference  LV ID, ED, PLAX chordal                44    mm       43 - 52  LV ID,  ES, PLAX chordal                31    mm       23 - 38  LV fx shortening, PLAX chordal         30    %        >=29  LV PW thickness, ED                    16    mm       ---------  IVS/LV PW ratio, ED                    0.88           <=1.3  Stroke volume, 2D                      104   ml       ---------  Stroke volume/bsa, 2D                  39    ml/m^2   ---------  LV e&', lateral                         10    cm/s     ---------  LV E/e&', lateral                       6.6            ---------  LV e&', medial                          6.85  cm/s     ---------  LV E/e&', medial                        9.64           ---------  LV e&', average                         8.43  cm/s     ---------  LV E/e&', average                       7.83           ---------    Ventricular septum                     Value          Reference  IVS thickness, ED                      14    mm       ---------    LVOT                                   Value          Reference  LVOT ID, S                             25    mm       ---------  LVOT area                              4.91  cm^2     ---------  LVOT ID                                25    mm       ---------  LVOT peak velocity, S                  118   cm/s     ---------  LVOT mean velocity, S                  77.3  cm/s     ---------  LVOT VTI, S                            21.2  cm       ---------  LVOT peak gradient, S                  6     mm Hg    ---------  Stroke volume (SV), LVOT DP            104.1 ml       ---------  Stroke index (SV/bsa), LVOT DP         39.5  ml/m^2   ---------    Aorta                                  Value          Reference  Aortic root ID, ED                     38    mm       ---------  Ascending aorta ID, A-P, S             37    mm       ---------    Left atrium                            Value          Reference  LA ID, A-P, ES                         39    mm       ---------  LA ID/bsa, A-P                          1.48  cm/m^2   <=2.2  LA volume, S                           50.8  ml       ---------  LA volume/bsa, S                       19.3  ml/m^2   ---------  LA volume, ES, 1-p A4C                 52.5  ml       ---------  LA volume/bsa, ES, 1-p A4C             19.9  ml/m^2   ---------  LA volume, ES, 1-p A2C                 45.7  ml       ---------  LA volume/bsa, ES, 1-p A2C             17.3  ml/m^2   ---------    Mitral valve                           Value          Reference  Mitral E-wave peak velocity            66    cm/s     ---------  Mitral A-wave peak velocity            89.1  cm/s     ---------  Mitral deceleration  time       (H)     256   ms       150 - 230  Mitral pressure half-time              75    ms       ---------  Mitral E/A ratio, peak                 0.7            ---------  Mitral valve area, PHT, DP             2.93  cm^2     ---------  Mitral valve area/bsa, PHT, DP         1.11  cm^2/m^2 ---------    Pulmonary arteries                     Value          Reference  PA pressure, S, DP                     20    mm Hg    <=30    Tricuspid valve                        Value          Reference  Tricuspid regurg peak velocity         208   cm/s     ---------  Tricuspid peak RV-RA gradient          17    mm Hg    ---------    Systemic veins                         Value          Reference  Estimated CVP                          3     mm Hg    ---------    Right ventricle                        Value          Reference  RV pressure, S, DP                     20    mm Hg    <=30  Legend: (L)  and  (H)  mark values outside specified reference range.  ------------------------------------------------------------------- Prepared and Electronically Authenticated by  Mertie Moores, M.D. 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  No evidence of disease on CT of chest or abdomen    CPX testing done to evaluate his decreased diffusion capacity and overall pulmonary function exercise tolerance. He  Did better then expectedl on his CPX testing, peak VO2- 23  I further discussed with the patient the risks and options of of esophagectomy.  Although he would be at increased risk for surgery, but is not prohibitive.  However after discussing with patient esophageal resection,  he points out at his age he does not wish to proceed with surgical resection of the  esophagus.  I have explained to him that submucosal resection may not be curative depending on the final pathology.    He may be willing to reconsider depending on the final pathology, a submucosal resection  I will plan to see him back in follow-up after he is seen at [facility].    Grace Isaac MD      [address] [place] [address] Office [phone]   Beeper [phone]  04/09/2018 8:35 PM

## 2018-04-17 ENCOUNTER — Telehealth: Payer: Self-pay

## 2018-04-17 NOTE — Telephone Encounter (Signed)
Patient contacted to advise him to contact our office after his appointment with [facility] which he stated weas on 04/23/2018@ 1030 am.  Will continue to monitor.

## 2018-04-17 NOTE — Telephone Encounter (Signed)
-----   Message from Grace Isaac, MD sent at 04/16/2018  5:36 PM EST ----- Ok good, keep note on that so we can follow up with him ----- Message ----- From: Donnella Sham, RN Sent: 04/16/2018   4:16 PM EST To: Grace Isaac, MD  Patient contacted the office stating you wanted to know when his appointment was at [facility].  He said it was schedule for 04/23/2018 @ 10:30 am.  Just passing the message along.  Thanks,  Caryl Pina

## 2018-04-23 ENCOUNTER — Telehealth: Payer: Self-pay | Admitting: Neurology

## 2018-04-23 NOTE — Telephone Encounter (Signed)
Dr Gladstone Lighter patient was seen today for a GI procedure and aneasthesiologist Dr Belenda Cruise  was concerned about him not taken Pyridostigmine at all.  He is on cellcept- he has not taken mestinon for 2 month or longer, stating he did not need it. He will see his [place] neurologist earlier in 2020 for follow up. The procedure is too take place still today.

## 2018-05-01 ENCOUNTER — Telehealth: Payer: Self-pay

## 2018-05-01 NOTE — Telephone Encounter (Signed)
Patient contacted and made aware of follow-up appointment 05/14/2018 @ 3:30pm.  Patient also advised to bring documentation and pictures with him.  Patient acknowledged receipt.

## 2018-05-01 NOTE — Telephone Encounter (Signed)
-----   Message from Grace Isaac, MD sent at 05/01/2018  8:38 AM EST ----- Yes have him come back and soon ----- Message ----- From: Donnella Sham, RN Sent: 04/30/2018   3:40 PM EST To: Grace Isaac, MD  Patient contacted the office again.  He has had his appointment with GI in [facility].  They suggested f/u with you.  Notes are in Care everywhere from that visit.  Would you like for me to set up an appointment with you?  Please advise.  Thanks, Caryl Pina

## 2018-05-14 ENCOUNTER — Ambulatory Visit: Payer: Medicare Other | Admitting: Cardiothoracic Surgery

## 2018-05-15 ENCOUNTER — Other Ambulatory Visit: Payer: Self-pay

## 2018-05-15 ENCOUNTER — Encounter: Payer: Self-pay | Admitting: Cardiothoracic Surgery

## 2018-05-15 ENCOUNTER — Telehealth: Payer: Self-pay

## 2018-05-15 ENCOUNTER — Ambulatory Visit (INDEPENDENT_AMBULATORY_CARE_PROVIDER_SITE_OTHER): Payer: Medicare Other | Admitting: Cardiothoracic Surgery

## 2018-05-15 ENCOUNTER — Other Ambulatory Visit: Payer: Self-pay | Admitting: Radiation Oncology

## 2018-05-15 VITALS — BP 108/66 | HR 83 | Resp 18 | Ht 73.0 in | Wt 297.8 lb

## 2018-05-15 DIAGNOSIS — C160 Malignant neoplasm of cardia: Secondary | ICD-10-CM

## 2018-05-15 NOTE — Telephone Encounter (Signed)
  Oncology Nurse Navigator Documentation     Called and VM introducing my role as GI navigator and letting patient know that rad onc had ordered a PET scan that will be needed prior to starting chemo/radiation. Provided my contact information for questions or concerns.

## 2018-05-15 NOTE — Progress Notes (Signed)
[name]       [name] [address]             [name] Record #[id number] Date of Birth: 23-Sep-1945  Referring: Shellia Carwin, PA-C Primary Care: [name], MD Primary Cardiologist: No primary care provider on file.  Chief Complaint:    Esophageal carcinoma  History of Present Illness:    Philip [name] 73 y.o. 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  Patient has had a known history of Barrett's esophagus from previous endoscopies follow-up endoscopy done recently led to a positive biopsy for adenocarcinoma.  Subsequent EUS was performed.  Patient was seen by medical oncology and referred for surgical evaluation. He has also been seen by [facility]. He is a distant smoker briefly and quit at the age of 21.   The patient returns to the office today to further discuss risks and options of surgical treatment.  He was also referred to [facility] for consideration of endoscopic submucosal resection.  December 23 he was seen in rescoped but a submucosal resection could not be done, the report from [facility] has been scanned in under media.   I have had several discussions with the patient about surgical resection, he has been very concerned about doing this and comes into the office today wanting to consider radiation and chemotherapy in place of surgery, or at least to delay surgery.  He originally presented with ocular myasthenia but it also involved swallowing choking symptoms, the symptoms have improved though he still notes generalized weakness, has trouble getting from sitting to standing position, and gets tired after 2 to 3 hours of physical activity.      Currently on CellCept  for myasthenia gravis.   Patient has seen cardiology   followed by Neurology for recently diagnosed myasthenia  gravis-January 2019 UPDATE (12/04/17, VRP): Since last visit, doing well. Symptoms are resolved. Tolerating cellcept and mestinon. No more double vision. No generalized fatigue.   UPDATE (03/13/17, VRP): Since last visit, doing well. IVIG helped quite a bit. Tolerating pyridostigmine. No alleviating or aggravating factors. No generalized weakness but notes some non-specific "tired" feeling. No double vision or drooping eyelids.   UPDATE (01/17/17, VRP): Since last visit, doing well on pyridostigmine. It worked the next day. AchR ab are positive. Some generalized fatigue and weakness are noted and new since last visit. Went to ER for laryngospasm (?upper resp infx) and tx'd with 4 days prednisone and oral lidocaine.    PRIOR HPI (12/21/16): 73 year old male here for evaluation of double vision. Her past to 3 weeks patient has had intermittent double vision.  Symptoms present with both eyes open but improve if he closes one eye or uses an eye patch. He describes objects as being up and to the side from each other.  They slightly overlap.  This fluctuates.  Symptoms worse later in the day.  He also had some swallowing difficulties for the past 2 months without a specific cause to be found. He has some generalized fatigue as well. Patient went to [facility] eye clinic, with no specific ocular pathology found.  Patient was referred here for consideration of possible neuromuscular, neuro inflammatory or neuro vascular cause of symptoms. No  unilateral numbness or weakness.  No sudden strokelike symptoms.  No headaches.  No prodromal factors.     Current Activity/ Functional Status:  Patient is independent with mobility/ambulation, transfers, ADL's, IADL's.   Zubrod Score: At the time of surgery this patient's most appropriate activity status/level should be described as: '[]'     0    Normal activity, no symptoms '[x]'     1    Restricted in physical strenuous activity but ambulatory, able to do out light work '[]'      2    Ambulatory and capable of self care, unable to do work activities, up and about               >50 % of waking hours                              '[]'     3    Only limited self care, in bed greater than 50% of waking hours '[]'     4    Completely disabled, no self care, confined to bed or chair '[]'     5    Moribund   Past Medical History:  Diagnosis Date  . Abdominal hernia   . Anemia   . Asthma   . Barrett's esophagus   . Cancer (HCC)    Esophagel cancer  . Cataract    Bil surgery  . Cough    over 2 weeks  . Fluid retention in legs   . GERD (gastroesophageal reflux disease)   . H/O hiatal hernia   . Hypertension   . Myasthenia gravis ([facility])   . Sleep apnea    don't use c-pap at home/ tried for several years  . SOB (shortness of breath)     Past Surgical History:  Procedure Laterality Date  . BALLOON DILATION N/A 10/19/2016   Procedure: BALLOON DILATION;  Surgeon: Ladene Artist, MD;  Location: [facility];  Service: Endoscopy;  Laterality: N/A;  . COLONOSCOPY W/ POLYPECTOMY    . ESOPHAGOGASTRODUODENOSCOPY N/A 10/19/2016   Procedure: ESOPHAGOGASTRODUODENOSCOPY (EGD);  Surgeon: Ladene Artist, MD;  Location: [facility];  Service: Endoscopy;  Laterality: N/A;  . ESOPHAGOGASTRODUODENOSCOPY (EGD) WITH PROPOFOL N/A 02/12/2018   Procedure: ESOPHAGOGASTRODUODENOSCOPY (EGD) WITH PROPOFOL;  Surgeon: [name], MD;  Location: [place];  Service: Gastroenterology;  Laterality: N/A;  . EUS N/A 02/12/2018   Procedure: UPPER ENDOSCOPIC ULTRASOUND (EUS) RADIAL;  Surgeon: [name], MD;  Location: [place];  Service: Gastroenterology;  Laterality: N/A;  . EYE SURGERY Bilateral    cataract  . LUMBAR LAMINECTOMY     1980's    Family History  Problem Relation Age of Onset  . Heart disease Mother   . Lung cancer Father   . Colon cancer Neg Hx      Social History   Tobacco Use  Smoking Status Former Smoker  . Packs/day: 1.00  . Years: 7.00   . Pack years: 7.00  . Last attempt to quit: 12/21/1976  . Years since quitting: 41.4  Smokeless Tobacco Never Used    Social History   Substance and Sexual Activity  Alcohol Use Not Currently   Comment: ocassional     No Known Allergies  Current Outpatient Medications  Medication Sig Dispense Refill  . ALBUTEROL IN Inhale 2 puffs into the lungs every 4 (four) hours as needed (shortness of breath or wheezing).     [name] amitriptyline (ELAVIL) 25 MG tablet  Take 25 mg by mouth daily.     [name] aspirin EC 81 MG tablet Take 81 mg by mouth daily.    [name] atorvastatin (LIPITOR) 10 MG tablet Take 10 mg by mouth at bedtime.     . diclofenac (VOLTAREN) 75 MG EC tablet Take 75 mg by mouth daily as needed.     . Esomeprazole Magnesium 20 MG TBEC Take 40 mg by mouth daily at 12 noon.     [name] lisinopril (PRINIVIL,ZESTRIL) 20 MG tablet Take 20 mg by mouth daily.    . Multiple Vitamin (MULTIVITAMIN) tablet Take 1 tablet by mouth daily.    . mycophenolate (CELLCEPT) 500 MG tablet Take 2 tablets (1,000 mg total) by mouth 2 (two) times daily. 120 tablet 12  . traMADol (ULTRAM) 50 MG tablet Take 50 mg by mouth every 6 (six) hours as needed for pain.     No current facility-administered medications for this visit.     Pertinent items are noted in HPI.   Review of Systems:     Cardiac Review of Systems: [Y] = yes  or   [ N ] = no   Chest Pain Aqua.Slicker  ]  Resting SOB Jazmín.Cullens ] Exertional SOB  [ Y ]  Orthopnea Aqua.Slicker ]   Pedal Edema [ Y  ]    Palpitations [ N] Syncope  Aqua.Slicker ]   Presyncope [ N ]   General Review of Systems: [Y] = yes [  ]=no Constitional: recent weight change [n  ];  Wt loss over the last 3 months [   ] anorexia [  ]; fatigue [  ]; nausea [  ]; night sweats [  ]; fever [  ]; or chills [  ];           Eye : blurred vision [  ]; diplopia [   ]; vision changes [  ];  Amaurosis fugax[  ]; Resp: cough [  ];  wheezing[  ];  hemoptysis[  ]; shortness of breath[  ]; paroxysmal nocturnal dyspnea[  ]; dyspnea on  exertion[y ]; or orthopnea[  ];  GI:  gallstones[  ], vomiting[  ];  dysphagia[  ]; melena[  ];  hematochezia [  ]; heartburn[  ];   Hx of  Colonoscopy[  ]; GU: kidney stones [  ]; hematuria[  ];   dysuria [  ];  nocturia[  ];  history of     obstruction [  ]; urinary frequency [  ]             Skin: rash, swelling[  ];, hair loss[  ];  peripheral edema[  ];  or itching[  ]; Musculosketetal: myalgias[  ];  joint swelling[  ];  joint erythema[  ];  joint pain[  ];  back pain[  ];  Heme/Lymph: bruising[  ];  bleeding[  ];  anemia[  ];  Neuro: TIA[  ];  headaches[  ];  stroke[  ];  vertigo[  ];  seizures[  ];   paresthesias[  ];  difficulty walking[ Y; double vision   Psych:depression[  ]; anxiety[  ];  Endocrine: diabetes[  ];  thyroid dysfunction[  ];  Immunizations: Flu up to date [  ]; Pneumococcal up to date [  ];  Other:     PHYSICAL EXAMINATION: BP 108/66 (BP Location: Right Arm, Patient Position: Sitting, Cuff Size: Large)   Pulse 83   Resp 18   Ht '6\' 1"'  (1.854 m)  Wt 297 lb 12.8 oz (135.1 kg)   SpO2 95% Comment: RA  BMI 39.29 kg/m  General appearance: alert, cooperative and no distress Head: Normocephalic, without obvious abnormality, atraumatic Neck: no adenopathy, no carotid bruit, no JVD, supple, symmetrical, trachea midline and thyroid not enlarged, symmetric, no tenderness/mass/nodules Lymph nodes: Cervical, supraclavicular, and axillary nodes normal. Resp: clear to auscultation bilaterally Back: symmetric, no curvature. ROM normal. No CVA tenderness. Cardio: regular rate and rhythm, S1, S2 normal, no murmur, click, rub or gallop GI: soft, non-tender; bowel sounds normal; no masses,  no organomegaly Extremities: extremities normal, atraumatic, no cyanosis or edema and Homans sign is negative, no sign of DVT Neurologic: Grossly normal  Diagnostic Studies & Laboratory data:     Recent Radiology Findings:     Ct chest  Abdomen Pelvis W Contrast  Result Date:  02/06/2018 CLINICAL DATA:  Esophageal adenocarcinoma. Biopsy on 01/22/2018 demonstrating poorly differentiated adenocarcinoma at GE junction. EXAM: CT CHEST, ABDOMEN, AND PELVIS WITH CONTRAST TECHNIQUE: Multidetector CT imaging of the chest, abdomen and pelvis was performed following the standard protocol during bolus administration of intravenous contrast. CONTRAST:  185m ISOVUE-300 IOPAMIDOL (ISOVUE-300) INJECTION 61% COMPARISON:  Chest CT 01/19/2017.  No prior abdominopelvic CTs. FINDINGS: CT CHEST FINDINGS Cardiovascular: Bovine arch. Normal heart size, without pericardial effusion. Lipomatous hypertrophy of the interatrial septum. No central pulmonary embolism, on this non-dedicated study. Mediastinum/Nodes: No supraclavicular adenopathy. No mediastinal or hilar adenopathy. Normal appearance of the esophagus. Lungs/Pleura: No pleural fluid. The previously described 4 mm lingular nodule is no longer identified. Musculoskeletal: No acute osseous abnormality. CT ABDOMEN PELVIS FINDINGS Hepatobiliary: Normal liver. Normal gallbladder, without biliary ductal dilatation. Pancreas: Normal, without mass or ductal dilatation. Spleen: Normal in size, without focal abnormality. Adrenals/Urinary Tract: Normal adrenal glands. Normal kidneys, without hydronephrosis. Normal urinary bladder. Stomach/Bowel: Normal stomach, without wall thickening. Scattered colonic diverticula. Normal terminal ileum. Nonobstructive 12 mm lipoma within the inferior genu of the duodenum on coronal image 92 and transverse image 80/2. Otherwise normal small bowel. Vascular/Lymphatic: Aortic atherosclerosis. No abdominopelvic adenopathy. Reproductive: Normal prostate. Other: No significant free fluid. A tiny fat containing left inguinal hernia. No evidence of omental or peritoneal disease. Musculoskeletal: No acute osseous abnormality. Thoracolumbar spondylosis. IMPRESSION: 1. No esophageal primary identified. No findings of metastatic disease in  the chest, abdomen, or pelvis. 2.  Aortic Atherosclerosis (ICD10-I70.0). Electronically Signed   By: KAbigail MiyamotoM.D.   On: 02/06/2018 11:13     I have independently reviewed the above radiology studies  and reviewed the findings with the patient.   Recent Lab Findings: Lab Results  Component Value Date   WBC 8.1 03/28/2018   HGB 13.8 03/28/2018   HCT 41.8 03/28/2018   PLT 209 03/28/2018   GLUCOSE 108 (H) 03/28/2018   ALT 18 03/28/2018   AST 16 03/28/2018   NA 143 03/28/2018   K 4.4 03/28/2018   CL 109 03/28/2018   CREATININE 0.82 03/28/2018   BUN 17 03/28/2018   CO2 25 03/28/2018   TSH 2.040 12/21/2016   HGBA1C 5.4 12/21/2016  EGD: There were esophageal mucosal changes secondary to established short-segment Barrett's disease present at the gastroesophageal junction. The maximum longitudinal extent of these mucosal changes was 1 cm in length. Findings: - A single small, inflamed mucosal nodule was found at the gastroesophageal junction within the Barrett's esophagus. Biopsies were taken with a cold forceps for histology. - Patchy mildly erythematous mucosa without bleeding was found in the prepyloric region of the stomach. - The cardia  and gastric fundus were normal on retroflexion. - A single 12 mm submucosal nodule was found in the second portion of the duodenum. - The exam of the duodenum was otherwise normal.  Esophageal mucosal changes secondary to established short-segment Barrett's disease. - Mucosal nodule found in the esophagus. Biopsied. - Erythematous mucosa in the prepyloric region of the stomach. - Submucosal nodule found in the duodenum. Impression: - Patient has a contact number available for emergencies. The signs and symptoms of potential delayed complications were discussed with the patient. Return to normal activities tomorrow. Written discharge instructions were provided to the patient. - Resume previous diet. - Continue present medications. - Await  pathology results. - See the other procedure note for documentation of additional recommendations.     EUS: by Dr Justice Britain  EGD Impression: - No gross lesions in proximal/middle esophagus. - Friable (with spontaneous bleeding), granular, inflamed, ulcerated mucosa in the esophagus - consistent with previously noted biopsies with adenocarcinoma at the distal esophagus/GEJxn/proximal cardia. - Nodular mucosa in the gastric antrum. - No other gross lesions in the stomach. - Submucosal nodule found in the duodenum. EUS Impression: - A mass was found in the distal esophagus into the gastroesophageal junction and extending to proximal cardia. A tissue diagnosis was obtained prior to this exam consistent with adenocarcinoma. This was staged at T2N0Mx, because of a loss of interface of the muscularis propria as the distal GE Junction becomes the cardia and the mass moves into this region, but there are many more regions and images that suggest a T1 lesion into the submucosa. - No malignant-appearing lymph nodes were visualized in the anterior mediastinum (level 6), subcarinal mediastinum (level 7), middle paraesophageal mediastinum (level 40M), lower paraesophageal mediastinum (level 8L), diaphragmatic region (level 15), mediastinal periaortic region, paracardial region (level 16), left gastric region (level 17) and gastrohepatic ligament (level 18). Impression: esophagus/GEJxn/proximal cardia. - Nodular mucosa in the gastric antrum. - No other gross lesions in the stomach. - Submucosal nodule found in the duodenum. EUS Impression: - A mass was found in the distal esophagus into the gastroesophageal junction and extending to proximal cardia. A tissue diagnosis was obtained prior to this exam consistent with adenocarcinoma. This was staged at T2N0Mx, because of a loss of interface of the muscularis propria as the distal GE Junction becomes the cardia and the mass moves into this  region, but there are many more regions and images that suggest a T1 lesion into the submucosa. - No malignant-appearing lymph nodes were visualized in the anterior mediastinum (level 6), subcarinal mediastinum (level 7), middle paraesophageal mediastinum (level 40M), lower paraesophageal mediastinum (level 8L), diaphragmatic region (level 15), mediastinal periaortic region, paracardial region (level 16), left gastric region (level 17) and  Diagnosis [id number] [facility]   Surgical [P], GE junction nodule BX - POORLY 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  6 min walk test- Walked 784 feet Borg 3 sat to 93%    Referred for: Risk Stratification- lung resection CPX testing  Procedure: This patient underwent staged symptom-limited exercise testing using a individualized bike protocol with expired gas analysis metabolic evaluation during exercise.  Demographics  Age: 75 Ht. (in.) 92 Wt. (lb) 299 BMI: 39.4   Predicted Peak VO2: 17.6  Gender: Male Ht (cm) 185.6 Wt. (kg) 135.6    Results  Pre-Exercise PFTs   FVC 2.73 (58%)     FEV1 2.30 (65%)      FEV1/FVC 84 (111%)  MVV 104 (75%)      Exercise Time:  9:15   Watts: 108  RPE: 17  Reason stopped: dyspnea (9/10)  Additional symptoms: None reported  Resting HR: 95 Peak HR: 132  (89% age predicted max HR)  BP rest: 142/70 BP peak: 216/74  Peak VO2: 15.1 (86% predicted peak VO2)  VE/VCO2 slope: 33  OUES: 2.39  Peak RER: 1.09  Ventilatory Threshold: 11.0 (63% predicted or 73% measured peak VO2)  Peak RR 40  Peak Ventilation: 76.5  VE/MVV: 74%  PETCO2 at peak: 32  O2pulse: 12  (75% predicted O2pulse)   Interpretation  Notes: Patient gave a very good effort. Pulse-oximetry remained 96-98% for the duration of exercise. Exercise was performed on a cycle ergometer starting at [facility] and increasing by 12W/min.  ECG:  Resting ECG in sinus rhythm with incomplete RBBB. HR response appropriate. There were no sustained arrhythmias or ST-T changes noted. BP hypertensive at peak exercise.   PFT: Pre-exercise spirometry demonstrates moderate restrictive properties. MVV normal  CPX: Exercise testing with gas exchange demonstrates a low normal peak VO2 of 15.1 ml/kg/min (86% of the age/gender/weight matched sedentary norms). The RER of 1.09 indicates a near maximal effort. When adjusted to the patient's ideal body weight of 189.1lb (85.8 kg) the peak VO2 is 23.9 ml/kg (ibw)/min (98% of the ibw-adjusted predicted). The VE/VCO2 slope is mildly elevated and indicates increased dead space ventilation. The oxygen uptake efficiency slope (OUES) is normal. The VO2 at the ventilatory threshold was normal at 63% of the predicted peak VO2 and 73% measured PVO2. At peak exercise, the ventilation reached 74% of the measured MVV indicating ventilatory reserve was nearly depleted. The O2pulse (a surrogate for stroke volume) increased with incremental exercise, reaching blunted peak of 12 ml/beat (75% predicted).    Conclusion: Exercise testing with gas exchange demonstrates normal functional capacity when compared to matched sedentary norms. There is no obvious cardiopulmonary limitations. PVO2 indicates minimal risk for peri-operative complications. Note, there was a hypertensive response to exercise, with moderate restrictive properties measured duriing pre-exercise spirometry indicating patient is ventilatory limited to exercise due to his body habitus.    Test, report and preliminary impression by: Landis Martins, MS, ACSM-RCEP 04/06/2018 3:57 PM  Agree with above. Low normal functional capacity when compared to matched controls. No obvious cardiac limitation. When corrected for the patient's obesity the functional capacity is normal. Resting spirometry shows moderate restriction and at peak exercise the patient is limited by his  obesity and restricted ventilation. There was a hypertensive response to exercise.    Glori Bickers, MD  7:43 AM  Transthoracic Echocardiography  Patient:    Jabe, Jeanbaptiste MR #:       [id number] Study Date: 02/27/2018 Gender:     M Age:        73 Height:     182.9 cm Weight:     131.3 kg BSA:        2.64 m^2 Pt. Status: Room:   SONOGRAPHER  Blondell Reveal  ATTENDING    Lyman Bishop MD  [place] MD  REFERRING    Lyman Bishop MD  PERFORMING   [facility], Outpatient  cc:  ------------------------------------------------------------------- LV EF: 60% -   65%  ------------------------------------------------------------------- Indications:      (R06.02).  ------------------------------------------------------------------- History:   PMH:  Pre-op evaluation. Acquired from the patient and from the patient&'s chart.  Dyspnea.  Risk factors:  Hypertension. Obese.  ------------------------------------------------------------------- Study Conclusions  - Left  ventricle: The cavity size was normal. Wall thickness was   increased in a pattern of moderate LVH. Systolic function was   normal. The estimated ejection fraction was in the range of 60%   to 65%. Wall motion was normal; there were no regional wall   motion abnormalities. Doppler parameters are consistent with   abnormal left ventricular relaxation (grade 1 diastolic   dysfunction).  Impressions:  - The echo is technically difficult with poor images.   The valves are not seen well.  ------------------------------------------------------------------- Study data:  No prior study was available for comparison.  Study status:  Routine.  Procedure:  The patient reported no pain pre or post test. Transthoracic echocardiography for left ventricular function evaluation. Image quality was poor. The study was technically difficult, as a result of poor acoustic windows, poor sound wave  transmission, and body habitus. Intravenous contrast (Definity) was administered to opacify the LV.  Study completion: There were no complications.          Transthoracic echocardiography.  M-mode, complete 2D, spectral Doppler, and color Doppler.  Birthdate:  Patient birthdate: 11/07/1945.  Age:  Patient is 73 yr old.  Sex:  Gender: male.    BMI: 39.2 kg/m^2.  Blood pressure:     120/58  Patient status:  Outpatient.  Study date: Study date: 02/27/2018. Study time: 01:09 PM.  Location:  [facility]  -------------------------------------------------------------------  ------------------------------------------------------------------- Left ventricle:  The cavity size was normal. Wall thickness was increased in a pattern of moderate LVH. Systolic function was normal. The estimated ejection fraction was in the range of 60% to 65%. Wall motion was normal; there were no regional wall motion abnormalities. Doppler parameters are consistent with abnormal left ventricular relaxation (grade 1 diastolic dysfunction).  ------------------------------------------------------------------- Aortic valve:  Poorly visualized.  Doppler:  There was no regurgitation.  ------------------------------------------------------------------- Mitral valve:  Poorly visualized.  Doppler:  There was no significant regurgitation.    Valve area by pressure half-time: 2.93 cm^2. Indexed valve area by pressure half-time: 1.11 cm^2/m^2.   ------------------------------------------------------------------- Left atrium:  The atrium was normal in size.  ------------------------------------------------------------------- Right ventricle:  The cavity size was normal. Systolic function was normal.  ------------------------------------------------------------------- Pulmonic valve:    The valve appears to be grossly normal. Doppler:  There was no significant  regurgitation.  ------------------------------------------------------------------- Pericardium:  There was no pericardial effusion.  ------------------------------------------------------------------- Measurements   Left ventricle                         Value          Reference  LV ID, ED, PLAX chordal                44    mm       43 - 52  LV ID, ES, PLAX chordal                31    mm       23 - 38  LV fx shortening, PLAX chordal         30    %        >=29  LV PW thickness, ED                    16    mm       ---------  IVS/LV PW ratio, ED  0.88           <=1.3  Stroke volume, 2D                      104   ml       ---------  Stroke volume/bsa, 2D                  39    ml/m^2   ---------  LV e&', lateral                         10    cm/s     ---------  LV E/e&', lateral                       6.6            ---------  LV e&', medial                          6.85  cm/s     ---------  LV E/e&', medial                        9.64           ---------  LV e&', average                         8.43  cm/s     ---------  LV E/e&', average                       7.83           ---------    Ventricular septum                     Value          Reference  IVS thickness, ED                      14    mm       ---------    LVOT                                   Value          Reference  LVOT ID, S                             25    mm       ---------  LVOT area                              4.91  cm^2     ---------  LVOT ID                                25    mm       ---------  LVOT peak velocity, S                  118   cm/s     ---------  LVOT mean velocity, S  77.3  cm/s     ---------  LVOT VTI, S                            21.2  cm       ---------  LVOT peak gradient, S                  6     mm Hg    ---------  Stroke volume (SV), LVOT DP            104.1 ml       ---------  Stroke index (SV/bsa), LVOT DP         39.5  ml/m^2   ---------     Aorta                                  Value          Reference  Aortic root ID, ED                     38    mm       ---------  Ascending aorta ID, A-P, S             37    mm       ---------    Left atrium                            Value          Reference  LA ID, A-P, ES                         39    mm       ---------  LA ID/bsa, A-P                         1.48  cm/m^2   <=2.2  LA volume, S                           50.8  ml       ---------  LA volume/bsa, S                       19.3  ml/m^2   ---------  LA volume, ES, 1-p A4C                 52.5  ml       ---------  LA volume/bsa, ES, 1-p A4C             19.9  ml/m^2   ---------  LA volume, ES, 1-p A2C                 45.7  ml       ---------  LA volume/bsa, ES, 1-p A2C             17.3  ml/m^2   ---------    Mitral valve                           Value          Reference  Mitral E-wave peak velocity  66    cm/s     ---------  Mitral A-wave peak velocity            89.1  cm/s     ---------  Mitral deceleration time       (H)     256   ms       150 - 230  Mitral pressure half-time              75    ms       ---------  Mitral E/A ratio, peak                 0.7            ---------  Mitral valve area, PHT, DP             2.93  cm^2     ---------  Mitral valve area/bsa, PHT, DP         1.11  cm^2/m^2 ---------    Pulmonary arteries                     Value          Reference  PA pressure, S, DP                     20    mm Hg    <=30    Tricuspid valve                        Value          Reference  Tricuspid regurg peak velocity         208   cm/s     ---------  Tricuspid peak RV-RA gradient          17    mm Hg    ---------    Systemic veins                         Value          Reference  Estimated CVP                          3     mm Hg    ---------    Right ventricle                        Value          Reference  RV pressure, S, DP                     20    mm Hg    <=30  Legend: (L)  and  (H)  mark  values outside specified reference range.  ------------------------------------------------------------------- Prepared and Electronically Authenticated by  Mertie Moores, M.D. 2019-10-29T17:17:27     Repeat ENDO at [facility] Patient Name: Cassandria Anger      Procedure Date: 04/23/2018 1:51 PM MRN: [id number]           Date of Birth: [birth year] Admit Type: Outpatient        Age: 61 Room: [facility] OR [facility]     Gender: Male Note Status: Finalized        Instrument Name: [id number] _______________________________________________________________________________  Procedure:     Upper GI endoscopy Indications:    For therapy of malignant esophageal adenocarcinoma  Providers:     Renford Dills, MD, Manon Hilding, Lelan Pons Referring MD:   Grace Isaac, MD (Referring MD) Medicines:     Propofol per Anesthesia Complications:   No immediate complications. _______________________________________________________________________________ Procedure:     Pre-Anesthesia Assessment:           - Prior to the procedure, a History and Physical was            performed, and patient medications and allergies were            reviewed. The patient's tolerance of previous anesthesia            was also reviewed. The risks and benefits of the procedure            and the sedation options and risks were discussed with the            patient. All questions were answered, and informed consent            was obtained. Prior Anticoagulants: The patient has taken            aspirin, last dose was 1 day prior to procedure. ASA Grade            Assessment: III - A patient with severe systemic disease.            After reviewing the risks and benefits, the patient was            deemed in satisfactory condition to undergo the  procedure.           After obtaining informed consent, the endoscope was passed            under direct vision. Throughout the procedure, the            patient's blood pressure, pulse, and oxygen saturations            were monitored continuously. The Endoscope was introduced            through the mouth, and advanced to the second part of            duodenum. The upper GI endoscopy was accomplished without            difficulty. The patient tolerated the procedure well.                                          Findings:    A large, ulcerating mass was found at the gastroesophageal junction with     submucosal invasion at the superior aspect, 38 to 40 cm from the     incisors and extending into the cardia. The mass was partially     obstructing and partially circumferential (involving one-third of the     lumen circumference). The mass was not amenable to treatment with EMR as     it was ulcerated and scarred down and not amenable to lifting. Biopsies     were taken with a cold forceps for histology. Estimated blood loss was     minimal.    A small hiatal hernia was present.    A few dispersed erosions were found in the prepyloric region of the     stomach. Biopsies were taken with a cold forceps for Helicobacter pylori     testing.    A medium-sized submucosal mass with no bleeding was  found in the second     portion of the duodenum. This was seen on prior endoscopy. Biopsies were     taken with a cold forceps for histology. Estimated blood loss was     minimal.                                          Impression:    - Partially obstructing, malignant esophageal tumor was            found at the gastroesophageal junction. Not amenable to            EMR due to ulceration and fibrosis limiting  ability to            lift the lesion for resection. Biopsied.           - Small hiatal hernia.           - Gastric erosions. Biopsied.           - Duodenal mass. Biopsied. Recommendation:  - Patient has a contact number available for emergencies.            The signs and symptoms of potential delayed complications            were discussed with the patient. Return to normal            activities tomorrow. Written discharge instructions were            provided to the patient.           - Continue present medications.           - Resume previous diet.           - Await pathology results.           - Return to referring physician as previously scheduled.           - Recommend follow-up with surgery for ongoing evaluation            for surgical treatment.                                          Procedure Code(s): --- Professional ---           [phone], Esophagogastroduodenoscopy, flexible, transoral;            with biopsy, single or multiple Diagnosis Code(s): --- Professional ---           C16.0, Malignant neoplasm of cardia           K44.9, Diaphragmatic hernia without obstruction or gangrene           K25.9, Gastric ulcer, unspecified as acute or chronic,            without hemorrhage or perforation           K31.89, Other diseases of stomach and duodenum           C15.9, Malignant neoplasm of esophagus, unspecified  CPT copyright 2018 [employer]. All rights reserved.  The codes documented in this report are preliminary and upon coder review may  be revised to meet current compliance requirements.  Electronically Signed By Renford Dills, MD ________________ Renford Dills, MD 04/23/2018 3:03:59 PM The attending physician was  present throughout the entire procedural suite  including insertion, viewing, and removal. Number of Addenda: 0  Note Initiated On: 04/23/2018 1:51 PM  Path from [facility] 12/23:    Case Report Surgical Pathology Report             Case: [id number]                 Authorizing Provider: Renford Dills, MD      Collected:      04/23/2018 1414       Ordering Location:   GI [facility] [place]      Received:      04/23/2018 1613       Pathologist:      Danae Chen, MD                           Specimens:  A) - Duodenum, duodenal polyp: rule out dysplasia                         B) - Stomach, rule out H. pylori                                  C) - Esophagus, esophageal mass: rule out dysplasia                   [facility]   Addendum HER-2/neu staining is negative in foci suspicious for intramucosal adenocarcinoma.  [place] [facility] Addendum electronically signed by Danae Chen, MD on 04/26/2018 at 8:40 AM  Final Diagnosis A: Small bowel, duodenum, biopsy - Superficial fragments of duodenal mucosa with no significant pathologic abnormality, see comment - No villus abnormalities identified  B: Stomach, biopsy - Oxyntic mucosa with PPI effect - Foveolar hyperplasia, consistent with reactive gastropathy - No Helicobacter identified on H&E staining  C: Esophagus, mass, biopsy - Superficial fragments of intestinal type mucosa with high-grade dysplasia and at least intramucosal adenocarcinoma, see comment  This electronic signature is attestation that the pathologist personally reviewed the submitted material(s) and the final diagnosis reflects that evaluation.  [place] Electronically signed by Danae Chen, MD on 04/24/2018 at 8:58 AM  Comment For  specimen A, the superficial character of the biopsy specimen does not permit evaluation of a possible submucosal lesion. If clinical suspicion for a submucosal lesion persist, additional, deeper sampling should be considered.  For specimen C, a HER-2 IHC stain will be performed and reported in an addendum.  [facility]   Clinical History Barrett's esophagus with severe dysplasia-rule out H. pylori-rule out dysplasia  [facility]   Gross Description Received are 3 appropriately labeled containers.   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  No evidence of disease on CT of chest or abdomen    CPX testing done to evaluate his decreased diffusion capacity and overall pulmonary function exercise tolerance. He  Did better then expectedl on his CPX testing, peak VO2- 23  I further discussed with the patient the risks and options of of esophagectomy.  He would be at increased risk for surgery, but is not prohibitive.  Now with evaluation for submucosal resection not a possibility I further discussed with the patient treatment options including surgical resection.  Today I again discussed with the patient  esophageal resection,  he points out at his age he does  not wish to proceed with surgical resection of the esophagus.  He would like to return and see medical oncology and radiation oncology and discuss further treatment options.   I will plan to see him back in 1 month to continue to follow his progress.  He will see Dr. Burr Medico  and Dr. Lisbeth Renshaw in the near future.  Grace Isaac MD      [address] [place] [address] Office [phone]   Beeper [phone]  05/15/2018 2:35 PM

## 2018-05-16 ENCOUNTER — Ambulatory Visit: Payer: Medicare Other | Admitting: Cardiothoracic Surgery

## 2018-05-21 NOTE — Progress Notes (Signed)
GI Location of Tumor / Histology: Malignant neoplasm of gastroesophageal junction- distal esophagus  Cancer Staging 02/26/2018 Staging form: Esophagus - Adenocarcinoma, AJCC 8th Edition - Clinical stage from 02/26/2018: Stage IIB (cT2, cN0, cM0, G3)  PET 05/29/2018:   EGD [facility] 04/23/2018: Partially obstructing, malignant esophageal tumor was found at the GE junction.  Not amenable to EMR due to ulceration and fibrosis limiting ability to lift lesion for resection.  EUS 02/12/2018: A mass was found in the distal esophagus into the gastroesophageal junction and extending to proximal cardia.  This was staged at T2N0Mx.  CT CAP 02/05/2018: No esophageal primary identified.  No findings of metastatic disease in the chest, abdomen, or pelvis.  Upper endoscopy 01/22/2018:  Esophageal mucosal changes secondary to established short-segment Barrett's disease.  Mucosal nodule found in the esophagus.  Philip [name] presented for routine scope.  Biopsies of Esophagus 01/22/2018   Past/Anticipated interventions by surgeon, if any:  Dr. Servando Snare 05/15/2018 -Not a great candidate for surgical resection due to his medical co-morbidities. -December 23 he was seen in rescoped but a submucosal resection could not be done, the report from [facility] with Dr. Eston Mould has been scanned in under media.  -I further discussed with the patient the risks and options of of esophagectomy.  He would be at increased risk for surgery, but is not prohibitive.   -Now with evaluation for submucosal resection not a possibility I further discussed with the patient treatment options including surgical resection.   -Today I again discussed with the patient  esophageal resection,  he points out at his age he does not wish to proceed with surgical resection of the esophagus.  He would like to return and see medical oncology and radiation oncology and discuss further treatment options.   Past/Anticipated interventions by medical  oncology, if any:  Dr. Burr Medico 03/28/2018 -He was referred to Dr. Eston Mould at [facility] for second opinion on endoscopical resection.  Dr. Eston Mould feels due to  T2 disease, surgical resection is better, if no surgery then she will consider endoscopical resection and close monitoring. -In order to proceed with surgery he needs stress test and for his cardiologist Dr. Debara Pickett to clear. This has not been set. I strongly encouraged him to make sure this process moves forward as his cancer will keep growing.  -for his early stage esophageal cancer, chemoradiation is not recommended if he can undergo resection.    Weight changes, if any:   Bowel/Bladder complaints, if any: No  Nausea / Vomiting, if any: No  Pain issues, if any:  No   BP (!) 141/91 (BP Location: Left Arm, Patient Position: Sitting)   Pulse 89   Temp 98.2 F (36.8 C) (Oral)   Resp 18   Ht 6' (1.829 m)   Wt 296 lb (134.3 kg)   SpO2 97%   BMI 40.14 kg/m    Wt Readings from Last 3 Encounters:  05/22/18 296 lb (134.3 kg)  05/15/18 297 lb 12.8 oz (135.1 kg)  04/09/18 299 lb (135.6 kg)    SAFETY ISSUES:  Prior radiation? No  Pacemaker/ICD? No  Possible current pregnancy? No  Is the patient on methotrexate? No  Current Complaints/Details:

## 2018-05-22 ENCOUNTER — Encounter: Payer: Self-pay | Admitting: Radiation Oncology

## 2018-05-22 ENCOUNTER — Ambulatory Visit
Admission: RE | Admit: 2018-05-22 | Discharge: 2018-05-22 | Disposition: A | Discharge status: Home / Self Care | Payer: Medicare Other | Source: Ambulatory Visit | Attending: Radiation Oncology | Admitting: Radiation Oncology

## 2018-05-22 ENCOUNTER — Telehealth: Payer: Self-pay | Admitting: Hematology

## 2018-05-22 ENCOUNTER — Other Ambulatory Visit: Payer: Self-pay

## 2018-05-22 VITALS — BP 141/91 | HR 89 | Temp 98.2°F | Resp 18 | Ht 72.0 in | Wt 296.0 lb

## 2018-05-22 DIAGNOSIS — Z7982 Long term (current) use of aspirin: Secondary | ICD-10-CM | POA: Diagnosis not present

## 2018-05-22 DIAGNOSIS — K449 Diaphragmatic hernia without obstruction or gangrene: Secondary | ICD-10-CM | POA: Insufficient documentation

## 2018-05-22 DIAGNOSIS — Z87891 Personal history of nicotine dependence: Secondary | ICD-10-CM | POA: Insufficient documentation

## 2018-05-22 DIAGNOSIS — I10 Essential (primary) hypertension: Secondary | ICD-10-CM | POA: Insufficient documentation

## 2018-05-22 DIAGNOSIS — C160 Malignant neoplasm of cardia: Secondary | ICD-10-CM

## 2018-05-22 DIAGNOSIS — K219 Gastro-esophageal reflux disease without esophagitis: Secondary | ICD-10-CM | POA: Insufficient documentation

## 2018-05-22 DIAGNOSIS — G7000 Myasthenia gravis without (acute) exacerbation: Secondary | ICD-10-CM | POA: Diagnosis not present

## 2018-05-22 DIAGNOSIS — Z79899 Other long term (current) drug therapy: Secondary | ICD-10-CM | POA: Insufficient documentation

## 2018-05-22 DIAGNOSIS — G4730 Sleep apnea, unspecified: Secondary | ICD-10-CM | POA: Insufficient documentation

## 2018-05-22 NOTE — Progress Notes (Addendum)
Radiation Oncology         [phone]) [phone] ________________________________  Name: Philip [name]        MRN: [id number]  Date of Service: 05/22/2018 DOB: [birth year]  TO:IZTIW, Philip Bushy, MD  Philip Isaac, MD     REFERRING PHYSICIAN: Grace Isaac, MD   DIAGNOSIS: The encounter diagnosis was Malignant neoplasm of gastroesophageal junction ([place]).   HISTORY OF PRESENT ILLNESS: Philip [name] is a 73 y.o. male seen at the request of Dr. Servando Snare for a newly diagnosed adenocarcinoma of the distal esophagus/GE junction. The patient has had a history of GERD and Barrett's Esophagus and has had routine EGD q5 years for the last 20 or so years. He was seen in September and underwent EGD on 01/22/18 and this revealed a lesion in the distal esophagus into the GE junction was seen and biopsied revealed adenocarcinoma. He underwent EUS with Dr. Rush Landmark on 02/12/18 and his EUS showed T1-2N0 disease. He was referred to consider endoscopic mucosal resection with Dr. Eston Mould. Unfortunately at the time of the procedure on 04/23/18, the lesion was more broad based and he was not a candidate for this approach to resection. Additional biopsies of the site revealed adenocarcinoma, and a biopsy in the small bowel and stomach were negative. He was seen by Dr. Servando Snare following this and was offered esophagectomy but he has declined this procedure. He is scheduled for a PET scan on 05/29/2018. He instead comes today to discuss ChemoRT as an alternative to surgical resection.     PREVIOUS RADIATION THERAPY: No   PAST MEDICAL HISTORY:  Past Medical History:  Diagnosis Date  . Abdominal hernia   . Anemia   . Asthma   . Barrett's esophagus   . Cancer (HCC)    Esophagel cancer  . Cataract    Bil surgery  . Cough    over 2 weeks  . Fluid retention in legs   . GERD (gastroesophageal reflux disease)   . H/O hiatal hernia   . Hypertension   . Myasthenia gravis ([place])   . Sleep apnea    don't use  c-pap at home/ tried for several years  . SOB (shortness of breath)        PAST SURGICAL HISTORY: Past Surgical History:  Procedure Laterality Date  . BALLOON DILATION N/A 10/19/2016   Procedure: BALLOON DILATION;  Surgeon: Ladene Artist, MD;  Location: [facility];  Service: Endoscopy;  Laterality: N/A;  . COLONOSCOPY W/ POLYPECTOMY    . ESOPHAGOGASTRODUODENOSCOPY N/A 10/19/2016   Procedure: ESOPHAGOGASTRODUODENOSCOPY (EGD);  Surgeon: Ladene Artist, MD;  Location: [facility];  Service: Endoscopy;  Laterality: N/A;  . ESOPHAGOGASTRODUODENOSCOPY (EGD) WITH PROPOFOL N/A 02/12/2018   Procedure: ESOPHAGOGASTRODUODENOSCOPY (EGD) WITH PROPOFOL;  Surgeon: [name], MD;  Location: [facility];  Service: Gastroenterology;  Laterality: N/A;  . EUS N/A 02/12/2018   Procedure: UPPER ENDOSCOPIC ULTRASOUND (EUS) RADIAL;  Surgeon: [name], MD;  Location: [facility];  Service: Gastroenterology;  Laterality: N/A;  . EYE SURGERY Bilateral    cataract  . LUMBAR LAMINECTOMY     1980's     FAMILY HISTORY:  Family History  Problem Relation Age of Onset  . Heart disease Mother   . Lung cancer Father   . Colon cancer Neg Hx      SOCIAL HISTORY:  reports that he quit smoking about 41 years ago. He has a 7.00 pack-year smoking history. He has never used smokeless tobacco. He reports previous alcohol  use. He reports that he does not use drugs.   ALLERGIES: Patient has no known allergies.   MEDICATIONS:  Current Outpatient Medications  Medication Sig Dispense Refill  . ALBUTEROL IN Inhale 2 puffs into the lungs every 4 (four) hours as needed (shortness of breath or wheezing).     [name] amitriptyline (ELAVIL) 25 MG tablet Take 25 mg by mouth daily.     [name] aspirin EC 81 MG tablet Take 81 mg by mouth daily.    [name] atorvastatin (LIPITOR) 10 MG tablet Take 10 mg by mouth at bedtime.     . diclofenac (VOLTAREN) 75 MG EC tablet Take 75 mg by mouth daily as needed.     .  Esomeprazole Magnesium 20 MG TBEC Take 40 mg by mouth daily at 12 noon.     [name] lisinopril (PRINIVIL,ZESTRIL) 20 MG tablet Take 20 mg by mouth daily.    . Multiple Vitamin (MULTIVITAMIN) tablet Take 1 tablet by mouth daily.    . mycophenolate (CELLCEPT) 500 MG tablet Take 2 tablets (1,000 mg total) by mouth 2 (two) times daily. 120 tablet 12  . traMADol (ULTRAM) 50 MG tablet Take 50 mg by mouth every 6 (six) hours as needed for pain.     No current facility-administered medications for this encounter.      REVIEW OF SYSTEMS: On review of systems, the patient reports that he is doing well overall. He reports he is eating and drinking without difficulty. He reports he has had some issues with back pain but has had management of this with interventional approaches. He does have Myasthenia and walks with a cane. He denies any chest pain, shortness of breath, cough, fevers, chills, night sweats, unintended weight changes. He denies any bowel or bladder disturbances, and denies abdominal pain, nausea or vomiting. He denies any new musculoskeletal or joint aches or pains. A complete review of systems is obtained and is otherwise negative.     PHYSICAL EXAM:  Wt Readings from Last 3 Encounters:  05/22/18 296 lb (134.3 kg)  05/15/18 297 lb 12.8 oz (135.1 kg)  04/09/18 299 lb (135.6 kg)   Temp Readings from Last 3 Encounters:  05/22/18 98.2 F (36.8 C) (Oral)  03/28/18 98.2 F (36.8 C) (Oral)  02/26/18 97.9 F (36.6 C) (Oral)   BP Readings from Last 3 Encounters:  05/22/18 (!) 141/91  05/15/18 108/66  04/09/18 123/73   Pulse Readings from Last 3 Encounters:  05/22/18 89  05/15/18 83  04/09/18 100   Pain Assessment Pain Score: 0-No pain/10  In general this is a well appearing obese caucasian male in no acute distress. He is alert and oriented x4 and appropriate throughout the examination. HEENT reveals that the patient is normocephalic, atraumatic. EOMs are intact.Cardiopulmonary  assessment is negative for acute distress and he exhibits normal effort.     ECOG = 0  0 - Asymptomatic (Fully active, able to carry on all predisease activities without restriction)  1 - Symptomatic but completely ambulatory (Restricted in physically strenuous activity but ambulatory and able to carry out work of a light or sedentary nature. For example, light housework, office work)  2 - Symptomatic, <50% in bed during the day (Ambulatory and capable of all self care but unable to carry out any work activities. Up and about more than 50% of waking hours)  3 - Symptomatic, >50% in bed, but not bedbound (Capable of only limited self-care, confined to bed or chair 50% or more of waking hours)  4 - Bedbound (Completely disabled. Cannot carry on any self-care. Totally confined to bed or chair)  5 - Death   Eustace Pen MM, Creech RH, Tormey DC, et al. [id number]). "Toxicity and response criteria of the [facility]". [place] Oncol. 5 (6): 649-55    LABORATORY DATA:  Lab Results  Component Value Date   WBC 8.1 03/28/2018   HGB 13.8 03/28/2018   HCT 41.8 03/28/2018   MCV 92.7 03/28/2018   PLT 209 03/28/2018   Lab Results  Component Value Date   NA 143 03/28/2018   K 4.4 03/28/2018   CL 109 03/28/2018   CO2 25 03/28/2018   Lab Results  Component Value Date   ALT 18 03/28/2018   AST 16 03/28/2018   ALKPHOS 72 03/28/2018   BILITOT 0.8 03/28/2018      RADIOGRAPHY: No results found.     IMPRESSION/PLAN: 1. Stage I-IIB, cT1-2N0M0 adenocarcinoma of the distal esophagus/GI junction. Dr. Lisbeth Renshaw discusses the pathology findings and reviews the nature of early stage esophageal carcinoma. While he would be a candidate for definitive surgical resection, he has decided against this. As an alternative, Dr. Lisbeth Renshaw discusses the options of chemoRT to treat his cancer with close routine EGD per NCCN guidelines for survivorship. We discussed the risks, benefits, short, and long  term effects of radiotherapy, and the patient is interested in proceeding. Dr. Lisbeth Renshaw discusses the delivery and logistics of radiotherapy and anticipates a course of 5 1/2 weeks of radiotherapy. Written consent is obtained and placed in the chart, a copy was provided to the patient. We will coordinate simulation around the time of his PET imaging next week. We will also coordinate an appointment with medical oncology through the [facility]. He is however interested in the treatment being given in the [facility] location with Dr. Marin Olp or Dr. Maylon Peppers. A referral will be placed. 2. Myasthenia Gravis. His need for cellcept will be considered by medical oncology as they approach treatment recommendations. We will follow this expectantly.  In a visit lasting 60 minutes, greater than 50% of the time was spent face to face discussing his case, and coordinating the patient's care.   The above documentation reflects my direct findings during this shared patient visit. Please see the separate note by Dr. Lisbeth Renshaw on this date for the remainder of the patient's plan of care.    Carola Rhine, PAC

## 2018-05-22 NOTE — Telephone Encounter (Signed)
Scheduled appt per 1/21 sch message - pt is aware of appt date and time   

## 2018-05-23 ENCOUNTER — Telehealth: Payer: Self-pay | Admitting: Hematology

## 2018-05-23 NOTE — Telephone Encounter (Signed)
Spoke with patient and got him set up with the transportation program. Will add rides after his appt on 1/27.

## 2018-05-25 NOTE — Progress Notes (Signed)
[place]   Telephone:(336) [phone] Fax:(336) [phone]   Clinic Follow up Note   Patient Care Team: [name], MD as PCP - General (Family Medicine) Mansouraty, [name], MD as Consulting Physician (Gastroenterology)  Date of Service:  05/28/2018  CHIEF COMPLAINT: f/u GEJ cancer   SUMMARY OF ONCOLOGIC HISTORY: Oncology History   Cancer Staging Malignant neoplasm of gastroesophageal junction [facility]) Staging form: Esophagus - Adenocarcinoma, AJCC 8th Edition - Clinical stage from 02/26/2018: Stage IIB (cT2, cN0, cM0, G3) - Signed by [name], NP on 02/26/2018      Malignant neoplasm of gastroesophageal junction ([place])   01/22/2018 Initial Biopsy    Diagnosis Surgical [P], GE junction nodule BX - POORLY 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    01/22/2018 Procedure    COLONOSCOPY IMPRESSION - Redundant colon. - The examination was otherwise normal on direct and retroflexion views. - No specimens collected.  UPPER ENDOSCOPY IMPRESSION: - Esophageal mucosal changes secondary to established short-segment Barrett's disease. - Mucosal nodule found in the esophagus. Biopsied. - Erythematous mucosa in the prepyloric region of the stomach. - Submucosal nodule found in the duodenum.     02/01/2018 Initial Diagnosis    Malignant neoplasm of gastroesophageal junction ([place])    02/05/2018 Imaging    CT CAP IMPRESSION: 1. No esophageal primary identified. No findings of metastatic disease in the chest, abdomen, or pelvis. 2.  Aortic Atherosclerosis (ICD10-I70.0).    02/12/2018 Procedure    EUS Impression: - A mass was found in the distal esophagus into the gastroesophageal junction and extending to proximal cardia. A tissue diagnosis was obtained prior to this exam consistent with adenocarcinoma. This was staged at T2N0Mx, because of a loss of interface of the muscularis propria as the distal GE  Junction becomes the cardia and the mass moves into this region, but there are many more regions and images that suggest a T1 lesion into the submucosa.      CURRENT THERAPY:  Pending chemoRT   INTERVAL HISTORY:  OSMAN CALZADILLA is here for a follow up of his GE junction cancer.  He was seen by GI at [facility], attempted endoscopy resection but failed.  He was recently saw Dr. Servando Snare again, surgery was discussed, due to the high risk of surgery, decision was made to hold surgery for now, and try chemoradiation.  He is here to discuss chemo.  He overall feels well, denies significant dysphagia, odynophagia, or new abdominal symptoms.  His appetite is normal, weight is stable.   He has chronic back pain, managed by his pain specilist and on tramodole. He lives alone.   REVIEW OF SYSTEMS:   Constitutional: Denies fevers, chills or abnormal weight loss Eyes: Denies blurriness of vision Ears, nose, mouth, throat, and face: Denies mucositis or sore throat Respiratory: Denies cough, dyspnea or wheezes Cardiovascular: Denies palpitation, chest discomfort or lower extremity swelling Gastrointestinal:  Denies nausea, heartburn or change in bowel habits Skin: Denies abnormal skin rashes Lymphatics: Denies new lymphadenopathy or easy bruising Neurological:Denies numbness, tingling or new weaknesses, (+) chronic back pain  Behavioral/Psych: Mood is stable, no new changes  All other systems were reviewed with the patient and are negative.  MEDICAL HISTORY:  Past Medical History:  Diagnosis Date  . Abdominal hernia   . Anemia   . Asthma   . Barrett's esophagus   . Cancer (HCC)    Esophagel cancer  . Cataract    Bil  surgery  . Cough    over 2 weeks  . Fluid retention in legs   . GERD (gastroesophageal reflux disease)   . H/O hiatal hernia   . Hypertension   . Myasthenia gravis ([place])   . Sleep apnea    don't use c-pap at home/ tried for several years  . SOB (shortness of  breath)     SURGICAL HISTORY: Past Surgical History:  Procedure Laterality Date  . BALLOON DILATION N/A 10/19/2016   Procedure: BALLOON DILATION;  Surgeon: Ladene Artist, MD;  Location: [facility];  Service: Endoscopy;  Laterality: N/A;  . COLONOSCOPY W/ POLYPECTOMY    . ESOPHAGOGASTRODUODENOSCOPY N/A 10/19/2016   Procedure: ESOPHAGOGASTRODUODENOSCOPY (EGD);  Surgeon: Ladene Artist, MD;  Location: [facility];  Service: Endoscopy;  Laterality: N/A;  . ESOPHAGOGASTRODUODENOSCOPY (EGD) WITH PROPOFOL N/A 02/12/2018   Procedure: ESOPHAGOGASTRODUODENOSCOPY (EGD) WITH PROPOFOL;  Surgeon: [name], MD;  Location: [place];  Service: Gastroenterology;  Laterality: N/A;  . EUS N/A 02/12/2018   Procedure: UPPER ENDOSCOPIC ULTRASOUND (EUS) RADIAL;  Surgeon: [name], MD;  Location: [facility];  Service: Gastroenterology;  Laterality: N/A;  . EYE SURGERY Bilateral    cataract  . LUMBAR LAMINECTOMY     1980's    I have reviewed the social history and family history with the patient and they are unchanged from previous note.  ALLERGIES:  has No Known Allergies.  MEDICATIONS:  Current Outpatient Medications  Medication Sig Dispense Refill  . ALBUTEROL IN Inhale 2 puffs into the lungs every 4 (four) hours as needed (shortness of breath or wheezing).     [name] amitriptyline (ELAVIL) 25 MG tablet Take 25 mg by mouth daily.     [name] aspirin EC 81 MG tablet Take 81 mg by mouth daily.    [name] atorvastatin (LIPITOR) 10 MG tablet Take 10 mg by mouth at bedtime.     . diclofenac (VOLTAREN) 75 MG EC tablet Take 75 mg by mouth daily as needed.     . Esomeprazole Magnesium 20 MG TBEC Take 40 mg by mouth daily at 12 noon.     [name] lisinopril (PRINIVIL,ZESTRIL) 20 MG tablet Take 20 mg by mouth daily.    . Multiple Vitamin (MULTIVITAMIN) tablet Take 1 tablet by mouth daily.    . mycophenolate (CELLCEPT) 500 MG tablet Take 2 tablets (1,000 mg total) by mouth 2 (two) times daily.  120 tablet 12  . traMADol (ULTRAM) 50 MG tablet Take 50 mg by mouth every 6 (six) hours as needed for pain.     No current facility-administered medications for this visit.     PHYSICAL EXAMINATION: ECOG PERFORMANCE STATUS: 1 - Symptomatic but completely ambulatory  Vitals:   05/28/18 1216  BP: 133/71  Pulse: 86  Resp: 18  Temp: 98.4 F (36.9 C)  SpO2: 96%   Filed Weights   05/28/18 1216  Weight: 298 lb 12.8 oz (135.5 kg)    GENERAL:alert, no distress and comfortable, obese Caucasian male SKIN: skin color, texture, turgor are normal, no rashes or significant lesions EYES: normal, Conjunctiva are pink and non-injected, sclera clear OROPHARYNX:no exudate, no erythema and lips, buccal mucosa, and tongue normal  NECK: supple, thyroid normal size, non-tender, without nodularity LYMPH:  no palpable lymphadenopathy in the cervical, axillary or inguinal LUNGS: clear to auscultation and percussion with normal breathing effort HEART: regular rate & rhythm and no murmurs and no lower extremity edema ABDOMEN:abdomen soft, non-tender and normal bowel sounds Musculoskeletal:no cyanosis of digits and  no clubbing  NEURO: alert & oriented x 3 with fluent speech, no focal motor/sensory deficits  LABORATORY DATA:  I have reviewed the data as listed CBC Latest Ref Rng & Units 03/28/2018 02/26/2018 12/04/2017  WBC 4.0 - 10.5 K/uL 8.1 7.2 6.8  Hemoglobin 13.0 - 17.0 g/dL 13.8 14.2 12.9(L)  Hematocrit 39.0 - 52.0 % 41.8 42.3 38.0  Platelets 150 - 400 K/uL 209 216 217     CMP Latest Ref Rng & Units 03/28/2018 02/26/2018 01/31/2018  Glucose 70 - 99 mg/dL 108(H) 100(H) -  BUN 8 - 23 mg/dL 17 16 17   Creatinine 0.61 - 1.24 mg/dL 0.82 0.93 0.87  Sodium 135 - 145 mmol/L 143 138 -  Potassium 3.5 - 5.1 mmol/L 4.4 4.0 -  Chloride 98 - 111 mmol/L 109 107 -  CO2 22 - 32 mmol/L 25 24 -  Calcium 8.9 - 10.3 mg/dL 11.0(H) 11.5(H) -  Total Protein 6.5 - 8.1 g/dL 6.4(L) 6.7 -  Total Bilirubin 0.3 - 1.2  mg/dL 0.8 0.8 -  Alkaline Phos 38 - 126 U/L 72 80 -  AST 15 - 41 U/L 16 16 -  ALT 0 - 44 U/L 18 16 -      RADIOGRAPHIC STUDIES: I have personally reviewed the radiological images as listed and agreed with the findings in the report. No results found.   ASSESSMENT & PLAN:  RANDALL COLDEN is a 73 y.o. male with   1. 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 He was evaluated by GI at [facility].  Endoscopic action was tried but failed. -He was evaluated by thoracic surgeon Dr. Servando Snare, felt not to be a good candidate for surgery, due to his age, comorbidities.  Patient is also reluctant to go surgery.  He wants to try chemo and radiation. -it has been 3 to 4 months since his initial diagnosis, will repeat a PET scan for restaging. -I discussed the cure rate from chemo and radiation alone is low, standard treatment is to surgery.  -I recommend weekly carbo and Taxol with concurrent radiation, given his early stage disease and limited social support.  --Chemotherapy consent: Side effects including but does not not limited to, fatigue, nausea, vomiting, diarrhea, hair loss, neuropathy, fluid retention, renal and kidney dysfunction, neutropenic fever, needed for blood transfusion, bleeding, were discussed with patient in great detail. He agrees to proceed. -we discussed to repeat endoscopy 2-3 months after chemoRT, followed by surveillance scans.  2. Chronic backpain -He is on tramadol as needed.  He will follow-up with his pain specialist  3. HTN -continue lisinopril, and follow-up with PCP  4. Obesity   5. Social issues -He lives alone in [place], [facility] has helped to arrange his transportation on his day of chemotherapy   PLAN: -plan to start weekly carboplatin and Taxol, with concurrent radiation, likely from next week -Lab and follow-up weekly during his chemo and radiation. -chemo class    No  problem-specific Assessment & Plan notes found for this encounter.   No orders of the defined types were placed in this encounter.  All questions were answered. The patient knows to call the clinic with any problems, questions or concerns. No barriers to learning was detected. I spent 25 minutes counseling the patient face to face. The total time spent in the appointment was 30 minutes and more than 50% was on counseling and review of test results     Krista Blue  Burr Medico, MD 05/28/2018   I, Joslyn Devon, am acting as scribe for Truitt Merle, MD.   I have reviewed the above documentation for accuracy and completeness, and I agree with the above.

## 2018-05-28 ENCOUNTER — Inpatient Hospital Stay: Payer: Medicare Other | Attending: Nurse Practitioner | Admitting: Hematology

## 2018-05-28 ENCOUNTER — Encounter: Payer: Self-pay | Admitting: Hematology

## 2018-05-28 ENCOUNTER — Ambulatory Visit: Payer: Medicare Other | Admitting: Radiation Oncology

## 2018-05-28 VITALS — BP 133/71 | HR 86 | Temp 98.4°F | Resp 18 | Ht 72.0 in | Wt 298.8 lb

## 2018-05-28 DIAGNOSIS — G8929 Other chronic pain: Secondary | ICD-10-CM | POA: Insufficient documentation

## 2018-05-28 DIAGNOSIS — C160 Malignant neoplasm of cardia: Secondary | ICD-10-CM | POA: Diagnosis present

## 2018-05-28 DIAGNOSIS — I10 Essential (primary) hypertension: Secondary | ICD-10-CM | POA: Insufficient documentation

## 2018-05-28 DIAGNOSIS — Z510 Encounter for antineoplastic radiation therapy: Secondary | ICD-10-CM | POA: Insufficient documentation

## 2018-05-28 DIAGNOSIS — Z7982 Long term (current) use of aspirin: Secondary | ICD-10-CM | POA: Diagnosis not present

## 2018-05-28 DIAGNOSIS — M549 Dorsalgia, unspecified: Secondary | ICD-10-CM | POA: Insufficient documentation

## 2018-05-28 DIAGNOSIS — E669 Obesity, unspecified: Secondary | ICD-10-CM | POA: Diagnosis not present

## 2018-05-28 DIAGNOSIS — Z79899 Other long term (current) drug therapy: Secondary | ICD-10-CM | POA: Diagnosis not present

## 2018-05-29 ENCOUNTER — Ambulatory Visit (HOSPITAL_COMMUNITY)
Admission: RE | Admit: 2018-05-29 | Discharge: 2018-05-29 | Disposition: A | Discharge status: Home / Self Care | Payer: Medicare Other | Source: Ambulatory Visit | Attending: Radiation Oncology | Admitting: Radiation Oncology

## 2018-05-29 ENCOUNTER — Encounter: Payer: Self-pay | Admitting: Hematology

## 2018-05-29 ENCOUNTER — Telehealth: Payer: Self-pay | Admitting: Hematology

## 2018-05-29 DIAGNOSIS — C160 Malignant neoplasm of cardia: Secondary | ICD-10-CM | POA: Diagnosis not present

## 2018-05-29 LAB — GLUCOSE, CAPILLARY: Glucose-Capillary: 106 mg/dL — ABNORMAL HIGH (ref 70–99)

## 2018-05-29 MED ORDER — PROCHLORPERAZINE MALEATE 10 MG PO TABS: 10.0000 mg | ORAL_TABLET | Freq: Four times a day (QID) | ORAL | 1 refills | Status: DC | PRN

## 2018-05-29 MED ORDER — FLUDEOXYGLUCOSE F - 18 (FDG) INJECTION
14.9000 | Freq: Once | INTRAVENOUS | Status: AC | PRN
  Administered 2018-05-29: 14.9 via INTRAVENOUS

## 2018-05-29 MED ORDER — ONDANSETRON HCL 8 MG PO TABS: 8.0000 mg | ORAL_TABLET | Freq: Two times a day (BID) | ORAL | 1 refills | Status: DC | PRN

## 2018-05-29 NOTE — Telephone Encounter (Signed)
Per 01/27 los appts were already scheduled.  I added more treatments to his calendar.

## 2018-05-29 NOTE — Progress Notes (Signed)
START ON PATHWAY REGIMEN - Gastroesophageal     Administer weekly during RT:     Paclitaxel      Carboplatin   **Always confirm dose/schedule in your pharmacy ordering system**  Patient Characteristics: Esophageal & GE Junction, Adenocarcinoma, Preoperative or Nonsurgical Candidate (Clinical Staging), cT2 or Higher or cN+, Surgical Candidate (Up to cT4a) - Preoperative Therapy, GE Junction Histology: Adenocarcinoma Disease Classification: GE Junction Therapeutic Status: Preoperative or Nonsurgical Candidate (Clinical Staging) AJCC Grade: GX AJCC 8 Stage Grouping: IIB AJCC T Category: cT2 AJCC N Category: cN0 AJCC M Category: cM0 Intent of Therapy: Curative Intent, Discussed with Patient 

## 2018-05-30 ENCOUNTER — Telehealth: Payer: Self-pay | Admitting: Radiation Oncology

## 2018-05-31 ENCOUNTER — Other Ambulatory Visit (HOSPITAL_COMMUNITY): Payer: Self-pay | Admitting: Physician Assistant

## 2018-05-31 ENCOUNTER — Other Ambulatory Visit (HOSPITAL_COMMUNITY): Payer: Self-pay

## 2018-05-31 ENCOUNTER — Telehealth: Payer: Self-pay

## 2018-05-31 NOTE — Telephone Encounter (Signed)
I called to let the patient know his PET results, but had to leave him a message.

## 2018-05-31 NOTE — Telephone Encounter (Signed)
Spoke with patient and he will pick up a copy of his current scheduled in chemo edu. Per 1/28 los

## 2018-06-01 ENCOUNTER — Ambulatory Visit
Admission: RE | Admit: 2018-06-01 | Discharge: 2018-06-01 | Disposition: A | Discharge status: Home / Self Care | Payer: Medicare Other | Source: Ambulatory Visit | Attending: Radiation Oncology | Admitting: Radiation Oncology

## 2018-06-01 ENCOUNTER — Inpatient Hospital Stay: Payer: Medicare Other

## 2018-06-01 DIAGNOSIS — C160 Malignant neoplasm of cardia: Secondary | ICD-10-CM

## 2018-06-01 DIAGNOSIS — Z510 Encounter for antineoplastic radiation therapy: Secondary | ICD-10-CM | POA: Diagnosis not present

## 2018-06-04 NOTE — Progress Notes (Signed)
  Radiation Oncology         [phone] ________________________________  Name: Philip [name] MRN: [id number]  Date: 06/01/2018  DOB: [birth year]  SIMULATION AND TREATMENT PLANNING NOTE  DIAGNOSIS:     ICD-10-CM   1. Malignant neoplasm of gastroesophageal junction ([place]) C16.0      Site:  Esophagus/ GE junction  NARRATIVE:  The patient was brought to the [facility].  Identity was confirmed.  All relevant records and images related to the planned course of therapy were reviewed.   Written consent to proceed with treatment was confirmed which was freely given after reviewing the details related to the planned course of therapy had been reviewed with the patient.  Then, the patient was set-up in a stable reproducible supine position for radiation therapy.  CT images were obtained.  Surface markings were placed.    Medically necessary complex treatment device(s) for immobilization:  vac-lock bag.   The CT images were loaded into the planning software.  Then the target and avoidance structures were contoured.  Treatment planning then occurred.  The radiation prescription was entered and confirmed. I have requested : Intensity Modulated Radiotherapy (IMRT) is medically necessary for this case for the following reason:  sparing of nearby critical normal structures including the spinal cord, heart, and lungs.   The patient will undergo daily image guidance to ensure accurate localization of the target, and adequate minimize dose to the normal surrounding structures in close proximity to the target.   PLAN:  The patient will receive 50 Gy in 25 fractions initially, also using a simultaneous boost technique to 45 Gy. A boost will then be given to 6 Gy/ 5.4 Gy using a SIB technique to yield 56 Gy to the high dose target and 50.4 Gy to the lower dose target.  Special treatment procedure The patient will also receive concurrent chemotherapy during the treatment. The patient may  therefore experience increased toxicity or side effects and the patient will be monitored for such problems. This may require extra lab work as necessary. This therefore constitutes a special treatment procedure.   ________________________________   Jodelle Gross, MD, PhD

## 2018-06-04 NOTE — Progress Notes (Signed)
[place]   Telephone:([phone]) [phone] Fax:(336) [phone]   Clinic Follow up Note   Patient Care Team: [name], MD as PCP - General (Family Medicine) Mansouraty, [name], MD as Consulting Physician (Gastroenterology) 06/05/2018  CHIEF COMPLAINT: F/u GEJ cancer  SUMMARY OF ONCOLOGIC HISTORY: Oncology History   Cancer Staging Malignant neoplasm of gastroesophageal junction [facility]) Staging form: Esophagus - Adenocarcinoma, AJCC 8th Edition - Clinical stage from 02/26/2018: Stage IIB (cT2, cN0, cM0, G3) - Signed by [name], NP on 02/26/2018      Malignant neoplasm of gastroesophageal junction ([place])   01/22/2018 Initial Biopsy    Diagnosis Surgical [P], GE junction nodule BX - POORLY 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    01/22/2018 Procedure    COLONOSCOPY IMPRESSION - Redundant colon. - The examination was otherwise normal on direct and retroflexion views. - No specimens collected.  UPPER ENDOSCOPY IMPRESSION: - Esophageal mucosal changes secondary to established short-segment Barrett's disease. - Mucosal nodule found in the esophagus. Biopsied. - Erythematous mucosa in the prepyloric region of the stomach. - Submucosal nodule found in the duodenum.     02/01/2018 Initial Diagnosis    Malignant neoplasm of gastroesophageal junction ([place])    02/05/2018 Imaging    CT CAP IMPRESSION: 1. No esophageal primary identified. No findings of metastatic disease in the chest, abdomen, or pelvis. 2.  Aortic Atherosclerosis (ICD10-I70.0).    02/12/2018 Procedure    EUS Impression: - A mass was found in the distal esophagus into the gastroesophageal junction and extending to proximal cardia. A tissue diagnosis was obtained prior to this exam consistent with adenocarcinoma. This was staged at T2N0Mx, because of a loss of interface of the muscularis propria as the distal GE Junction becomes the cardia  and the mass moves into this region, but there are many more regions and images that suggest a T1 lesion into the submucosa.    05/29/2018 Imaging    NM PET Image Initial (PI) Skull Base To Thigh  IMPRESSION: 1. Hypermetabolic focus at the GE junction consistent with know adenocarcinoma. 2. No evidence of metastatic adenopathy in the mediastinum or upper abdomen. 3. No evidence of liver metastasis.  No pulmonary metastasis.    06/06/2018 -  Chemotherapy    The patient had palonosetron (ALOXI) injection 0.25 mg, 0.25 mg, Intravenous,  Once, 0 of 1 cycle CARBOplatin (PARAPLATIN) in sodium chloride 0.9 % 100 mL chemo infusion, , Intravenous,  Once, 0 of 1 cycle PACLitaxel (TAXOL) 132 mg in sodium chloride 0.9 % 250 mL chemo infusion (</= 80mg /m2), 50 mg/m2, Intravenous,  Once, 0 of 1 cycle  for chemotherapy treatment.      CURRENT THERAPY   Weekly carboplatin and Taxol, with concurrent radiation starting on 06/06/2018  INTERVAL HISTORY: Philip [name] is a 73 y.o. male who is here for follow-up. Since last seen he had a NM PET Image Initial (PI) Skull Base To Thigh. He is scheduled to start chemotherapy and radiation tomorrow.   Today, he is here by himself.  He feels well overall, has chronic back pain, and is concerned he has pain make it worse during chemotherapy.  He denies any abdominal pain, dysphagia, odynophagia or other new symptoms.  His appetite and energy level remains to be decent.  His aunt has came to stay with him to help him with the chemo and radiation.  Pertinent positives and negatives of review of systems are listed and detailed within the  above HPI.  REVIEW OF SYSTEMS:   Constitutional: Denies fevers, chills or abnormal weight loss Eyes: Denies blurriness of vision Ears, nose, mouth, throat, and face: Denies mucositis or sore throat Respiratory: Denies cough, dyspnea or wheezes Cardiovascular: Denies palpitation, chest discomfort or lower extremity  swelling Gastrointestinal:  Denies nausea, heartburn or change in bowel habits Skin: Denies abnormal skin rashes Lymphatics: Denies new lymphadenopathy or easy bruising Neurological:Denies numbness, tingling or new weaknesses, (+) chronic back pain  Behavioral/Psych: Mood is stable, no new changes  All other systems were reviewed with the patient and are negative.  MEDICAL HISTORY:  Past Medical History:  Diagnosis Date  . Abdominal hernia   . Anemia   . Asthma   . Barrett's esophagus   . Cancer (HCC)    Esophagel cancer  . Cataract    Bil surgery  . Cough    over 2 weeks  . Fluid retention in legs   . GERD (gastroesophageal reflux disease)   . H/O hiatal hernia   . Hypertension   . Myasthenia gravis ([place])   . Sleep apnea    don't use c-pap at home/ tried for several years  . SOB (shortness of breath)     SURGICAL HISTORY: Past Surgical History:  Procedure Laterality Date  . BALLOON DILATION N/A 10/19/2016   Procedure: BALLOON DILATION;  Surgeon: Ladene Artist, MD;  Location: [facility];  Service: Endoscopy;  Laterality: N/A;  . COLONOSCOPY W/ POLYPECTOMY    . ESOPHAGOGASTRODUODENOSCOPY N/A 10/19/2016   Procedure: ESOPHAGOGASTRODUODENOSCOPY (EGD);  Surgeon: Ladene Artist, MD;  Location: [facility];  Service: Endoscopy;  Laterality: N/A;  . ESOPHAGOGASTRODUODENOSCOPY (EGD) WITH PROPOFOL N/A 02/12/2018   Procedure: ESOPHAGOGASTRODUODENOSCOPY (EGD) WITH PROPOFOL;  Surgeon: [name], MD;  Location: [place];  Service: Gastroenterology;  Laterality: N/A;  . EUS N/A 02/12/2018   Procedure: UPPER ENDOSCOPIC ULTRASOUND (EUS) RADIAL;  Surgeon: [name], MD;  Location: [place];  Service: Gastroenterology;  Laterality: N/A;  . EYE SURGERY Bilateral    cataract  . LUMBAR LAMINECTOMY     1980's    I have reviewed the social history and family history with the patient and they are unchanged from previous note.  ALLERGIES:  has No  Known Allergies.  MEDICATIONS:  Current Outpatient Medications  Medication Sig Dispense Refill  . ALBUTEROL IN Inhale 2 puffs into the lungs every 4 (four) hours as needed (shortness of breath or wheezing).     [name] amitriptyline (ELAVIL) 25 MG tablet Take 25 mg by mouth daily.     [name] aspirin EC 81 MG tablet Take 81 mg by mouth daily.    [name] atorvastatin (LIPITOR) 10 MG tablet Take 10 mg by mouth at bedtime.     . diclofenac (VOLTAREN) 75 MG EC tablet Take 75 mg by mouth daily as needed.     . Esomeprazole Magnesium 20 MG TBEC Take 40 mg by mouth daily at 12 noon.     [name] lisinopril (PRINIVIL,ZESTRIL) 20 MG tablet Take 20 mg by mouth daily.    . Multiple Vitamin (MULTIVITAMIN) tablet Take 1 tablet by mouth daily.    . mycophenolate (CELLCEPT) 500 MG tablet Take 2 tablets (1,000 mg total) by mouth 2 (two) times daily. 120 tablet 12  . ondansetron (ZOFRAN) 8 MG tablet Take 1 tablet (8 mg total) by mouth 2 (two) times daily as needed for refractory nausea / vomiting. Start on day 3 after chemo. 30 tablet 1  . prochlorperazine (COMPAZINE) 10 MG  tablet Take 1 tablet (10 mg total) by mouth every 6 (six) hours as needed (Nausea or vomiting). 30 tablet 1  . traMADol (ULTRAM) 50 MG tablet Take 50 mg by mouth every 6 (six) hours as needed for pain.     No current facility-administered medications for this visit.     PHYSICAL EXAMINATION: ECOG PERFORMANCE STATUS: 2 - Symptomatic, <50% confined to bed  Vitals:   06/05/18 1237  BP: 132/75  Pulse: 82  Resp: 18  Temp: 97.8 F (36.6 C)  SpO2: 95%   Filed Weights   06/05/18 1237  Weight: (!) 304 lb 4.8 oz (138 kg)    GENERAL:alert, no distress and comfortable SKIN: skin color, texture, turgor are normal, no rashes or significant lesions EYES: normal, Conjunctiva are pink and non-injected, sclera clear OROPHARYNX:no exudate, no erythema and lips, buccal mucosa, and tongue normal  NECK: supple, thyroid normal size, non-tender, without  nodularity LYMPH:  no palpable lymphadenopathy in the cervical, axillary or inguinal LUNGS: clear to auscultation and percussion with normal breathing effort HEART: regular rate & rhythm and no murmurs and no lower extremity edema ABDOMEN:abdomen soft, non-tender and normal bowel sounds Musculoskeletal:no cyanosis of digits and no clubbing  NEURO: alert & oriented x 3 with fluent speech, no focal motor/sensory deficits  LABORATORY DATA:  I have reviewed the data as listed CBC Latest Ref Rng & Units 06/05/2018 03/28/2018 02/26/2018  WBC 4.0 - 10.5 K/uL 6.8 8.1 7.2  Hemoglobin 13.0 - 17.0 g/dL 12.8(L) 13.8 14.2  Hematocrit 39.0 - 52.0 % 38.6(L) 41.8 42.3  Platelets 150 - 400 K/uL 199 209 216     CMP Latest Ref Rng & Units 06/05/2018 03/28/2018 02/26/2018  Glucose 70 - 99 mg/dL 98 108(H) 100(H)  BUN 8 - 23 mg/dL 17 17 16   Creatinine 0.61 - 1.24 mg/dL 0.90 0.82 0.93  Sodium 135 - 145 mmol/L 141 143 138  Potassium 3.5 - 5.1 mmol/L 4.4 4.4 4.0  Chloride 98 - 111 mmol/L 108 109 107  CO2 22 - 32 mmol/L 27 25 24   Calcium 8.9 - 10.3 mg/dL 10.7(H) 11.0(H) 11.5(H)  Total Protein 6.5 - 8.1 g/dL 6.3(L) 6.4(L) 6.7  Total Bilirubin 0.3 - 1.2 mg/dL 1.1 0.8 0.8  Alkaline Phos 38 - 126 U/L 74 72 80  AST 15 - 41 U/L 19 16 16   ALT 0 - 44 U/L 20 18 16       RADIOGRAPHIC STUDIES: I have personally reviewed the radiological images as listed and agreed with the findings in the report. No results found.   05/29/2018 NM PET Image Initial (PI) Skull Base To Thigh  IMPRESSION: 1. Hypermetabolic focus at the GE junction consistent with know adenocarcinoma. 2. No evidence of metastatic adenopathy in the mediastinum or upper abdomen. 3. No evidence of liver metastasis.  No pulmonary metastasis.  ASSESSMENT & PLAN:   Philip [name] is a 73 y.o. male with history of  1. 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 He was evaluated by GI at [facility] at [facility].  Endoscopic resection was tried but failed. -He was evaluated by thoracic surgeon Dr. Servando Snare, he is not a good candidate for surgery, due to his age, comorbidities.  He wants to try chemo and radiation. --I recommended weekly carbo and Taxol with concurrent radiation, given his early stage disease and limited social support. He agreed to proceed with chemotherapy.  -PET scan reviewed, he has hypermetabolic focus at the GE junction, no evidence  of metastatic adenopathy or distant metastasis. -Lab reviewed, adequate for treatment.  He is scheduled to start chemo and radiation tomorrow. -I again reviewed potential side effect from chemo, he has but is at the chemo class, chemo consent was obtained on last visit. -We will see him back next week.  2. Chronic backpain -He is on tramadol as needed.  He will follow-up with his pain specialist -Patient is concerned about worsening back pain during chemo and radiation, will monitor closely.  3. HTN -continue lisinopril - follow-up with PCP  4. Obesity   5. Social issues -He lives alone in [place] -He is and has came to stay with him and helps him during the chemo and the radiation.  She will drive and come in with him tomorrow  6.  Myasthenia gravis -he is on prednisone and CellCept, follow-up with his neurologist  -we discussed that his medications and chemo will suppress his immune system, he is at high risk for infections.  Plan  -scan and lab reviewed, adequate for treatment, he will start radiation and chemotherapy weekly carbo and Taxol tomorrow -lab and f/u weekly   No problem-specific Assessment & Plan notes found for this encounter.   No orders of the defined types were placed in this encounter.  All questions were answered. The patient knows to call the clinic with any problems, questions or concerns. No barriers to learning was detected. I spent 20 minutes counseling the patient face to face. The total time spent  in the appointment was 25 minutes and more than 50% was on counseling and review of test results  I, Manson Allan am acting as scribe for Dr. Truitt Merle.  I have reviewed the above documentation for accuracy and completeness, and I agree with the above.     Truitt Merle, MD 06/05/2018

## 2018-06-05 ENCOUNTER — Ambulatory Visit: Payer: Medicare Other | Admitting: Medical

## 2018-06-05 ENCOUNTER — Other Ambulatory Visit: Payer: Medicare Other

## 2018-06-05 ENCOUNTER — Inpatient Hospital Stay (HOSPITAL_BASED_OUTPATIENT_CLINIC_OR_DEPARTMENT_OTHER): Payer: Medicare Other | Admitting: Hematology

## 2018-06-05 ENCOUNTER — Inpatient Hospital Stay: Payer: Medicare Other | Attending: Nurse Practitioner

## 2018-06-05 ENCOUNTER — Encounter: Payer: Self-pay | Admitting: Hematology

## 2018-06-05 VITALS — BP 132/75 | HR 82 | Temp 97.8°F | Resp 18 | Ht 72.0 in | Wt 304.3 lb

## 2018-06-05 DIAGNOSIS — C160 Malignant neoplasm of cardia: Secondary | ICD-10-CM

## 2018-06-05 DIAGNOSIS — G7000 Myasthenia gravis without (acute) exacerbation: Secondary | ICD-10-CM

## 2018-06-05 DIAGNOSIS — M549 Dorsalgia, unspecified: Secondary | ICD-10-CM | POA: Insufficient documentation

## 2018-06-05 DIAGNOSIS — I10 Essential (primary) hypertension: Secondary | ICD-10-CM

## 2018-06-05 DIAGNOSIS — E669 Obesity, unspecified: Secondary | ICD-10-CM | POA: Insufficient documentation

## 2018-06-05 DIAGNOSIS — Z5111 Encounter for antineoplastic chemotherapy: Secondary | ICD-10-CM | POA: Diagnosis present

## 2018-06-05 DIAGNOSIS — Z510 Encounter for antineoplastic radiation therapy: Secondary | ICD-10-CM | POA: Diagnosis present

## 2018-06-05 DIAGNOSIS — G8929 Other chronic pain: Secondary | ICD-10-CM

## 2018-06-05 DIAGNOSIS — Z79899 Other long term (current) drug therapy: Secondary | ICD-10-CM | POA: Insufficient documentation

## 2018-06-05 LAB — CMP (CANCER CENTER ONLY)
ALT: 20 U/L (ref 0–44)
AST: 19 U/L (ref 15–41)
Albumin: 3.9 g/dL (ref 3.5–5.0)
Alkaline Phosphatase: 74 U/L (ref 38–126)
Anion gap: 6 (ref 5–15)
BUN: 17 mg/dL (ref 8–23)
CO2: 27 mmol/L (ref 22–32)
Calcium: 10.7 mg/dL — ABNORMAL HIGH (ref 8.9–10.3)
Chloride: 108 mmol/L (ref 98–111)
Creatinine: 0.9 mg/dL (ref 0.61–1.24)
GFR, Est AFR Am: >60 mL/min (ref >60)
GFR, Estimated: >60 mL/min (ref >60)
GLUCOSE: 98 mg/dL (ref 70–99)
Potassium: 4.4 mmol/L (ref 3.5–5.1)
Sodium: 141 mmol/L (ref 135–145)
Total Bilirubin: 1.1 mg/dL (ref 0.3–1.2)
Total Protein: 6.3 g/dL — ABNORMAL LOW (ref 6.5–8.1)

## 2018-06-05 LAB — CBC WITH DIFFERENTIAL (CANCER CENTER ONLY)
Abs Immature Granulocytes: 0.03 10*3/uL (ref 0.00–0.07)
Basophils Absolute: 0.1 10*3/uL (ref 0.0–0.1)
Basophils Relative: 1 %
Eosinophils Absolute: 0.3 10*3/uL (ref 0.0–0.5)
Eosinophils Relative: 4 %
HCT: 38.6 % — ABNORMAL LOW (ref 39.0–52.0)
Hemoglobin: 12.8 g/dL — ABNORMAL LOW (ref 13.0–17.0)
Immature Granulocytes: 0 %
Lymphocytes Relative: 21 %
Lymphs Abs: 1.5 10*3/uL (ref 0.7–4.0)
MCH: 31.1 pg (ref 26.0–34.0)
MCHC: 33.2 g/dL (ref 30.0–36.0)
MCV: 93.9 fL (ref 80.0–100.0)
MONO ABS: 0.7 10*3/uL (ref 0.1–1.0)
MONOS PCT: 11 %
Neutro Abs: 4.3 10*3/uL (ref 1.7–7.7)
Neutrophils Relative %: 63 %
Platelet Count: 199 10*3/uL (ref 150–400)
RBC: 4.11 MIL/uL — ABNORMAL LOW (ref 4.22–5.81)
RDW: 14 % (ref 11.5–15.5)
WBC Count: 6.8 10*3/uL (ref 4.0–10.5)
nRBC: 0 % (ref 0.0–0.2)

## 2018-06-05 LAB — IRON AND TIBC
Iron: 84 ug/dL (ref 42–163)
Saturation Ratios: 26 % (ref 20–55)
TIBC: 323 ug/dL (ref 202–409)
UIBC: 238 ug/dL (ref 117–376)

## 2018-06-05 LAB — FERRITIN: Ferritin: 66 ng/mL (ref 24–336)

## 2018-06-05 LAB — CEA (IN HOUSE-CHCC): CEA (CHCC-In House): 3.23 ng/mL (ref 0.00–5.00)

## 2018-06-06 ENCOUNTER — Telehealth: Payer: Self-pay | Admitting: Hematology

## 2018-06-06 ENCOUNTER — Ambulatory Visit
Admission: RE | Admit: 2018-06-06 | Discharge: 2018-06-06 | Disposition: A | Discharge status: Home / Self Care | Payer: Medicare Other | Source: Ambulatory Visit | Attending: Radiation Oncology | Admitting: Radiation Oncology

## 2018-06-06 ENCOUNTER — Inpatient Hospital Stay: Payer: Medicare Other

## 2018-06-06 VITALS — BP 128/78 | HR 76 | Temp 98.5°F | Resp 20 | Wt 306.2 lb

## 2018-06-06 DIAGNOSIS — Z5111 Encounter for antineoplastic chemotherapy: Secondary | ICD-10-CM | POA: Diagnosis not present

## 2018-06-06 DIAGNOSIS — Z510 Encounter for antineoplastic radiation therapy: Secondary | ICD-10-CM | POA: Diagnosis not present

## 2018-06-06 DIAGNOSIS — C160 Malignant neoplasm of cardia: Secondary | ICD-10-CM

## 2018-06-06 MED ORDER — SODIUM CHLORIDE 0.9 % IV SOLN: 10.0000 mg | Freq: Once | INTRAVENOUS | Status: DC

## 2018-06-06 MED ORDER — DIPHENHYDRAMINE HCL 50 MG/ML IJ SOLN
INTRAMUSCULAR | Status: AC
  Filled 2018-06-06: qty 1

## 2018-06-06 MED ORDER — SODIUM CHLORIDE 0.9 % IV SOLN
Freq: Once | INTRAVENOUS | Status: AC
  Administered 2018-06-06: 09:00:00 via INTRAVENOUS
  Filled 2018-06-06: qty 250

## 2018-06-06 MED ORDER — SODIUM CHLORIDE 0.9 % IV SOLN
300.0000 mg | Freq: Once | INTRAVENOUS | Status: AC
  Administered 2018-06-06: 300 mg via INTRAVENOUS
  Filled 2018-06-06: qty 30

## 2018-06-06 MED ORDER — PALONOSETRON HCL INJECTION 0.25 MG/5ML
INTRAVENOUS | Status: AC
  Filled 2018-06-06: qty 5

## 2018-06-06 MED ORDER — DEXAMETHASONE SODIUM PHOSPHATE 10 MG/ML IJ SOLN
10.0000 mg | Freq: Once | INTRAMUSCULAR | Status: AC
  Administered 2018-06-06: 10 mg via INTRAVENOUS

## 2018-06-06 MED ORDER — DIPHENHYDRAMINE HCL 50 MG/ML IJ SOLN
50.0000 mg | Freq: Once | INTRAMUSCULAR | Status: AC
  Administered 2018-06-06: 50 mg via INTRAVENOUS

## 2018-06-06 MED ORDER — FAMOTIDINE IN NACL 20-0.9 MG/50ML-% IV SOLN
20.0000 mg | Freq: Once | INTRAVENOUS | Status: AC
  Administered 2018-06-06: 20 mg via INTRAVENOUS

## 2018-06-06 MED ORDER — SODIUM CHLORIDE 0.9 % IV SOLN
50.0000 mg/m2 | Freq: Once | INTRAVENOUS | Status: AC
  Administered 2018-06-06: 132 mg via INTRAVENOUS
  Filled 2018-06-06: qty 22

## 2018-06-06 MED ORDER — FAMOTIDINE IN NACL 20-0.9 MG/50ML-% IV SOLN
INTRAVENOUS | Status: AC
  Filled 2018-06-06: qty 50

## 2018-06-06 MED ORDER — PALONOSETRON HCL INJECTION 0.25 MG/5ML
0.2500 mg | Freq: Once | INTRAVENOUS | Status: AC
  Administered 2018-06-06: 0.25 mg via INTRAVENOUS

## 2018-06-06 MED ORDER — DEXAMETHASONE SODIUM PHOSPHATE 10 MG/ML IJ SOLN
INTRAMUSCULAR | Status: AC
  Filled 2018-06-06: qty 1

## 2018-06-06 NOTE — Patient Instructions (Signed)
[facility] Discharge Instructions for Patients Receiving Chemotherapy  Today you received the following chemotherapy agents Paclitaxel (TAXOL) & Carboplatin (PARAPLATIN).  To help prevent nausea and vomiting after your treatment, we encourage you to take your nausea medication as prescribed. DO NOT TAKE ZOFRAN FOR THE NEXT 3 DAYS, TAKE COMPAZINE.   If you develop nausea and vomiting that is not controlled by your nausea medication, call the clinic.   BELOW ARE SYMPTOMS THAT SHOULD BE REPORTED IMMEDIATELY:  *FEVER GREATER THAN 100.5 F  *CHILLS WITH OR WITHOUT FEVER  NAUSEA AND VOMITING THAT IS NOT CONTROLLED WITH YOUR NAUSEA MEDICATION  *UNUSUAL SHORTNESS OF BREATH  *UNUSUAL BRUISING OR BLEEDING  TENDERNESS IN MOUTH AND THROAT WITH OR WITHOUT PRESENCE OF ULCERS  *URINARY PROBLEMS  *BOWEL PROBLEMS  UNUSUAL RASH Items with * indicate a potential emergency and should be followed up as soon as possible.  Feel free to call the clinic should you have any questions or concerns. The clinic phone number is [phone].  Please show the [facility] at check-in to the Emergency Department and triage nurse.  Paclitaxel injection What is this medicine? PACLITAXEL (PAK li TAX el) is a chemotherapy drug. It targets fast dividing cells, like cancer cells, and causes these cells to die. This medicine is used to treat ovarian cancer, breast cancer, lung cancer, Kaposi's sarcoma, and other cancers. This medicine may be used for other purposes; ask your health care provider or pharmacist if you have questions. COMMON BRAND NAME(S): Onxol, Taxol What should I tell my health care provider before I take this medicine? They need to know if you have any of these conditions: -history of irregular heartbeat -liver disease -low blood counts, like low white cell, platelet, or red cell counts -lung or breathing disease, like asthma -tingling of the fingers or toes, or other nerve  disorder -an unusual or allergic reaction to paclitaxel, alcohol, polyoxyethylated castor oil, other chemotherapy, other medicines, foods, dyes, or preservatives -pregnant or trying to get pregnant -breast-feeding How should I use this medicine? This drug is given as an infusion into a vein. It is administered in a hospital or clinic by a specially trained health care professional. Talk to your pediatrician regarding the use of this medicine in children. Special care may be needed. Overdosage: If you think you have taken too much of this medicine contact a poison control center or emergency room at once. NOTE: This medicine is only for you. Do not share this medicine with others. What if I miss a dose? It is important not to miss your dose. Call your doctor or health care professional if you are unable to keep an appointment. What may interact with this medicine? Do not take this medicine with any of the following medications: -disulfiram -metronidazole This medicine may also interact with the following medications: -antiviral medicines for hepatitis, HIV or AIDS -certain antibiotics like erythromycin and clarithromycin -certain medicines for fungal infections like ketoconazole and itraconazole -certain medicines for seizures like carbamazepine, phenobarbital, phenytoin -gemfibrozil -nefazodone -rifampin -St. John's wort This list may not describe all possible interactions. Give your health care provider a list of all the medicines, herbs, non-prescription drugs, or dietary supplements you use. Also tell them if you smoke, drink alcohol, or use illegal drugs. Some items may interact with your medicine. What should I watch for while using this medicine? Your condition will be monitored carefully while you are receiving this medicine. You will need important blood work done while you  are taking this medicine. This medicine can cause serious allergic reactions. To reduce your risk you will  need to take other medicine(s) before treatment with this medicine. If you experience allergic reactions like skin rash, itching or hives, swelling of the face, lips, or tongue, tell your doctor or health care professional right away. In some cases, you may be given additional medicines to help with side effects. Follow all directions for their use. This drug may make you feel generally unwell. This is not uncommon, as chemotherapy can affect healthy cells as well as cancer cells. Report any side effects. Continue your course of treatment even though you feel ill unless your doctor tells you to stop. Call your doctor or health care professional for advice if you get a fever, chills or sore throat, or other symptoms of a cold or flu. Do not treat yourself. This drug decreases your body's ability to fight infections. Try to avoid being around people who are sick. This medicine may increase your risk to bruise or bleed. Call your doctor or health care professional if you notice any unusual bleeding. Be careful brushing and flossing your teeth or using a toothpick because you may get an infection or bleed more easily. If you have any dental work done, tell your dentist you are receiving this medicine. Avoid taking products that contain aspirin, acetaminophen, ibuprofen, naproxen, or ketoprofen unless instructed by your doctor. These medicines may hide a fever. Do not become pregnant while taking this medicine. Women should inform their doctor if they wish to become pregnant or think they might be pregnant. There is a potential for serious side effects to an unborn child. Talk to your health care professional or pharmacist for more information. Do not breast-feed an infant while taking this medicine. Men are advised not to father a child while receiving this medicine. This product may contain alcohol. Ask your pharmacist or healthcare provider if this medicine contains alcohol. Be sure to tell all healthcare  providers you are taking this medicine. Certain medicines, like metronidazole and disulfiram, can cause an unpleasant reaction when taken with alcohol. The reaction includes flushing, headache, nausea, vomiting, sweating, and increased thirst. The reaction can last from 30 minutes to several hours. What side effects may I notice from receiving this medicine? Side effects that you should report to your doctor or health care professional as soon as possible: -allergic reactions like skin rash, itching or hives, swelling of the face, lips, or tongue -breathing problems -changes in vision -fast, irregular heartbeat -high or low blood pressure -mouth sores -pain, tingling, numbness in the hands or feet -signs of decreased platelets or bleeding - bruising, pinpoint red spots on the skin, black, tarry stools, blood in the urine -signs of decreased red blood cells - unusually weak or tired, feeling faint or lightheaded, falls -signs of infection - fever or chills, cough, sore throat, pain or difficulty passing urine -signs and symptoms of liver injury like dark yellow or brown urine; general ill feeling or flu-like symptoms; light-colored stools; loss of appetite; nausea; right upper belly pain; unusually weak or tired; yellowing of the eyes or skin -swelling of the ankles, feet, hands -unusually slow heartbeat Side effects that usually do not require medical attention (report to your doctor or health care professional if they continue or are bothersome): -diarrhea -hair loss -loss of appetite -muscle or joint pain -nausea, vomiting -pain, redness, or irritation at site where injected -tiredness This list may not describe all possible side effects. Call  your doctor for medical advice about side effects. You may report side effects to FDA at 1-800-FDA-1088. Where should I keep my medicine? This drug is given in a hospital or clinic and will not be stored at home. NOTE: This sheet is a summary. It  may not cover all possible information. If you have questions about this medicine, talk to your doctor, pharmacist, or health care provider.  2019 [employer] (2016-12-20 13:14:55)  Carboplatin injection What is this medicine? CARBOPLATIN (KAR boe pla tin) is a chemotherapy drug. It targets fast dividing cells, like cancer cells, and causes these cells to die. This medicine is used to treat ovarian cancer and many other cancers. This medicine may be used for other purposes; ask your health care provider or pharmacist if you have questions. COMMON BRAND NAME(S): Paraplatin What should I tell my health care provider before I take this medicine? They need to know if you have any of these conditions: -blood disorders -hearing problems -kidney disease -recent or ongoing radiation therapy -an unusual or allergic reaction to carboplatin, cisplatin, other chemotherapy, other medicines, foods, dyes, or preservatives -pregnant or trying to get pregnant -breast-feeding How should I use this medicine? This drug is usually given as an infusion into a vein. It is administered in a hospital or clinic by a specially trained health care professional. Talk to your pediatrician regarding the use of this medicine in children. Special care may be needed. Overdosage: If you think you have taken too much of this medicine contact a poison control center or emergency room at once. NOTE: This medicine is only for you. Do not share this medicine with others. What if I miss a dose? It is important not to miss a dose. Call your doctor or health care professional if you are unable to keep an appointment. What may interact with this medicine? -medicines for seizures -medicines to increase blood counts like filgrastim, pegfilgrastim, sargramostim -some antibiotics like amikacin, gentamicin, neomycin, streptomycin, tobramycin -vaccines Talk to your doctor or health care professional before taking any of  these medicines: -acetaminophen -aspirin -ibuprofen -ketoprofen -naproxen This list may not describe all possible interactions. Give your health care provider a list of all the medicines, herbs, non-prescription drugs, or dietary supplements you use. Also tell them if you smoke, drink alcohol, or use illegal drugs. Some items may interact with your medicine. What should I watch for while using this medicine? Your condition will be monitored carefully while you are receiving this medicine. You will need important blood work done while you are taking this medicine. This drug may make you feel generally unwell. This is not uncommon, as chemotherapy can affect healthy cells as well as cancer cells. Report any side effects. Continue your course of treatment even though you feel ill unless your doctor tells you to stop. In some cases, you may be given additional medicines to help with side effects. Follow all directions for their use. Call your doctor or health care professional for advice if you get a fever, chills or sore throat, or other symptoms of a cold or flu. Do not treat yourself. This drug decreases your body's ability to fight infections. Try to avoid being around people who are sick. This medicine may increase your risk to bruise or bleed. Call your doctor or health care professional if you notice any unusual bleeding. Be careful brushing and flossing your teeth or using a toothpick because you may get an infection or bleed more easily. If you have any dental  work done, tell your dentist you are receiving this medicine. Avoid taking products that contain aspirin, acetaminophen, ibuprofen, naproxen, or ketoprofen unless instructed by your doctor. These medicines may hide a fever. Do not become pregnant while taking this medicine. Women should inform their doctor if they wish to become pregnant or think they might be pregnant. There is a potential for serious side effects to an unborn child. Talk  to your health care professional or pharmacist for more information. Do not breast-feed an infant while taking this medicine. What side effects may I notice from receiving this medicine? Side effects that you should report to your doctor or health care professional as soon as possible: -allergic reactions like skin rash, itching or hives, swelling of the face, lips, or tongue -signs of infection - fever or chills, cough, sore throat, pain or difficulty passing urine -signs of decreased platelets or bleeding - bruising, pinpoint red spots on the skin, black, tarry stools, nosebleeds -signs of decreased red blood cells - unusually weak or tired, fainting spells, lightheadedness -breathing problems -changes in hearing -changes in vision -chest pain -high blood pressure -low blood counts - This drug may decrease the number of white blood cells, red blood cells and platelets. You may be at increased risk for infections and bleeding. -nausea and vomiting -pain, swelling, redness or irritation at the injection site -pain, tingling, numbness in the hands or feet -problems with balance, talking, walking -trouble passing urine or change in the amount of urine Side effects that usually do not require medical attention (report to your doctor or health care professional if they continue or are bothersome): -hair loss -loss of appetite -metallic taste in the mouth or changes in taste This list may not describe all possible side effects. Call your doctor for medical advice about side effects. You may report side effects to FDA at 1-800-FDA-1088. Where should I keep my medicine? This drug is given in a hospital or clinic and will not be stored at home. NOTE: This sheet is a summary. It may not cover all possible information. If you have questions about this medicine, talk to your doctor, pharmacist, or health care provider.  2019 [employer] Standard (2007-07-24 14:38:05)

## 2018-06-06 NOTE — Telephone Encounter (Signed)
No los per 02/04 . °

## 2018-06-07 ENCOUNTER — Ambulatory Visit
Admission: RE | Admit: 2018-06-07 | Discharge: 2018-06-07 | Disposition: A | Discharge status: Home / Self Care | Payer: Medicare Other | Source: Ambulatory Visit | Attending: Radiation Oncology | Admitting: Radiation Oncology

## 2018-06-07 DIAGNOSIS — Z510 Encounter for antineoplastic radiation therapy: Secondary | ICD-10-CM | POA: Diagnosis not present

## 2018-06-07 NOTE — Progress Notes (Signed)
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    01/22/2018 Procedure    COLONOSCOPY IMPRESSION - Redundant colon. - The examination was otherwise normal on direct and retroflexion views. - No specimens collected.  UPPER ENDOSCOPY IMPRESSION: - Esophageal mucosal changes secondary to established short-segment Barrett's disease. - Mucosal nodule found in the esophagus. Biopsied. - Erythematous mucosa in the prepyloric region of the stomach. - Submucosal nodule found in the duodenum.     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 No esophageal primary identified. No findings of metastatic disease in the chest, abdomen, or pelvis. 2.  Aortic Atherosclerosis (ICD10-I70.0).    02/12/2018 Procedure    EUS Impression: - A mass was found in the distal esophagus into the gastroesophageal junction and extending to proximal cardia. A tissue diagnosis was obtained prior to this exam consistent with adenocarcinoma. This was staged at T2N0Mx, because of a loss of interface of the muscularis propria as the distal GE Junction becomes the  cardia and the mass moves into this region, but there are many more regions and images that suggest a T1 lesion into the submucosa.    05/29/2018 Imaging    NM PET Image Initial (PI) Skull Base To Thigh  IMPRESSION: 1. Hypermetabolic focus at the GE junction consistent with know adenocarcinoma. 2. No evidence of metastatic adenopathy in the mediastinum or upper abdomen. 3. No evidence of liver metastasis.  No pulmonary metastasis.    06/06/2018 -  Chemotherapy    The patient had palonosetron (ALOXI) injection 0.25 mg, 0.25 mg, Intravenous,  Once, 1 of 1 cycle Administration: 0.25 mg (06/06/2018), 0.25 mg (06/12/2018) CARBOplatin (PARAPLATIN) 300 mg in sodium chloride 0.9 % 250 mL chemo infusion, 300 mg (100 % of original dose 300 mg), Intravenous,  Once, 1 of 1 cycle Dose modification:   (original dose 300 mg, Cycle 1) Administration: 300 mg (06/06/2018), 300 mg (06/12/2018) PACLitaxel (TAXOL) 132 mg in sodium chloride 0.9 % 250 mL chemo infusion (</= 80mg /m2), 50 mg/m2 = 132 mg, Intravenous,  Once, 1 of 1 cycle Administration: 132 mg (06/06/2018), 132 mg (06/12/2018)  for chemotherapy treatment.      CURRENT THERAPY  Weekly carboplatin and Taxol, with concurrent radiation starting on 06/06/2018  INTERVAL HISTORY: Philip [name] is a 73 y.o. male who is here for follow-up. Since last visit he started chemotherapy and radiation on 06/06/2018 and followed-up with his neurologist and said his myasthenia gravis is stable.Today, he is here alone and is doing well. Denies low appetite, nausea from last chemotherapy. He has been feeling fatigue lately. His aunt is no longer helping him but he feels fine to drive.   Pertinent positives and negatives of  review of systems are listed and detailed within the above HPI.  REVIEW OF SYSTEMS:   Constitutional: Denies fevers, chills or abnormal weight loss, (+) fatigue  Eyes: Denies blurriness of vision Ears, nose, mouth, throat, and face: Denies mucositis or sore  throat Respiratory: Denies cough, dyspnea or wheezes Cardiovascular: Denies palpitation, chest discomfort or lower extremity swelling Gastrointestinal:  Denies nausea, heartburn or change in bowel habits Skin: Denies abnormal skin rashes Lymphatics: Denies new lymphadenopathy or easy bruising Neurological:Denies numbness, tingling or new weaknesses Behavioral/Psych: Mood is stable, no new changes  All other systems were reviewed with the patient and are negative.  MEDICAL HISTORY:  Past Medical History:  Diagnosis Date  . Abdominal hernia   . Anemia   . Asthma   . Barrett's esophagus   . Cancer (HCC)    Esophagel cancer  . Cataract    Bil surgery  . Cough    over 2 weeks  . Fluid retention in legs   . GERD (gastroesophageal reflux disease)   . H/O hiatal hernia   . Hypertension   . Myasthenia gravis ([place])   . Sleep apnea    don't use c-pap at home/ tried for several years  . SOB (shortness of breath)     SURGICAL HISTORY: Past Surgical History:  Procedure Laterality Date  . BALLOON DILATION N/A 10/19/2016   Procedure: BALLOON DILATION;  Surgeon: Ladene Artist, MD;  Location: [facility];  Service: Endoscopy;  Laterality: N/A;  . COLONOSCOPY W/ POLYPECTOMY    . ESOPHAGOGASTRODUODENOSCOPY N/A 10/19/2016   Procedure: ESOPHAGOGASTRODUODENOSCOPY (EGD);  Surgeon: Ladene Artist, MD;  Location: [facility];  Service: Endoscopy;  Laterality: N/A;  . ESOPHAGOGASTRODUODENOSCOPY (EGD) WITH PROPOFOL N/A 02/12/2018   Procedure: ESOPHAGOGASTRODUODENOSCOPY (EGD) WITH PROPOFOL;  Surgeon: Rush Landmark Philip Nab., MD;  Location: [facility];  Service: Gastroenterology;  Laterality: N/A;  . EUS N/A 02/12/2018   Procedure: UPPER ENDOSCOPIC ULTRASOUND (EUS) RADIAL;  Surgeon: Rush Landmark Philip Nab., MD;  Location: [facility];  Service: Gastroenterology;  Laterality: N/A;  . EYE SURGERY Bilateral    cataract  . LUMBAR LAMINECTOMY     1980's    I have reviewed the social history  and family history with the patient and they are unchanged from previous note.  ALLERGIES:  has No Known Allergies.  MEDICATIONS:  Current Outpatient Medications  Medication Sig Dispense Refill  . ALBUTEROL IN Inhale 2 puffs into the lungs every 4 (four) hours as needed (shortness of breath or wheezing).     [name] amitriptyline (ELAVIL) 25 MG tablet Take 25 mg by mouth daily.     [name] aspirin EC 81 MG tablet Take 81 mg by mouth daily.    [name] atorvastatin (LIPITOR) 10 MG tablet Take 10 mg by mouth at bedtime.     . diclofenac (VOLTAREN) 75 MG EC tablet Take 75 mg by mouth daily as needed.     . Esomeprazole Magnesium 20 MG TBEC Take 40 mg by mouth daily at 12 noon.     [name] lisinopril (PRINIVIL,ZESTRIL) 20 MG tablet Take 20 mg by mouth daily.    . Multiple Vitamin (MULTIVITAMIN) tablet Take 1 tablet by mouth daily.    . mycophenolate (CELLCEPT) 500 MG tablet Take 2 tablets (1,000 mg total) by mouth 2 (two) times daily. 120 tablet 12  . ondansetron (ZOFRAN) 8 MG tablet Take 1 tablet (8 mg total) by mouth 2 (two) times daily as needed for refractory nausea / vomiting. Start on day 3 after chemo. 30 tablet  1  . prochlorperazine (COMPAZINE) 10 MG tablet Take 1 tablet (10 mg total) by mouth every 6 (six) hours as needed (Nausea or vomiting). 30 tablet 1  . pyridostigmine (MESTINON) 60 MG tablet Take 0.5-1 tablets (30-60 mg total) by mouth 3 (three) times daily. 90 tablet 12  . traMADol (ULTRAM) 50 MG tablet Take 50 mg by mouth every 6 (six) hours as needed for pain.     No current facility-administered medications for this visit.     PHYSICAL EXAMINATION: ECOG PERFORMANCE STATUS: 1 - Symptomatic but completely ambulatory  Vitals:   06/12/18 1228  BP: 125/67  Pulse: 79  Resp: (!) 24  Temp: 97.9 F (36.6 C)  SpO2: 95%   Filed Weights   06/12/18 1228  Weight: (!) 303 lb 9.6 oz (137.7 kg)    GENERAL:alert, no distress and comfortable SKIN: skin color, texture, turgor are normal, no rashes or  significant lesions EYES: normal, Conjunctiva are pink and non-injected, sclera clear OROPHARYNX:no exudate, no erythema and lips, buccal mucosa, and tongue normal  NECK: supple, thyroid normal size, non-tender, without nodularity LYMPH:  no palpable lymphadenopathy in the cervical, axillary or inguinal LUNGS: clear to auscultation and percussion with normal breathing effort HEART: regular rate & rhythm and no murmurs and no lower extremity edema ABDOMEN:abdomen soft, non-tender and normal bowel sounds Musculoskeletal:no cyanosis of digits and no clubbing  NEURO: alert & oriented x 3 with fluent speech, no focal motor/sensory deficits  LABORATORY DATA:  I have reviewed the data as listed CBC Latest Ref Rng & Units 06/12/2018 06/05/2018 03/28/2018  WBC 4.0 - 10.5 K/uL 5.1 6.8 8.1  Hemoglobin 13.0 - 17.0 g/dL 12.2(L) 12.8(L) 13.8  Hematocrit 39.0 - 52.0 % 36.0(L) 38.6(L) 41.8  Platelets 150 - 400 K/uL 205 199 209     CMP Latest Ref Rng & Units 06/12/2018 06/05/2018 03/28/2018  Glucose 70 - 99 mg/dL 88 98 108(H)  BUN 8 - 23 mg/dL 14 17 17   Creatinine 0.61 - 1.24 mg/dL 0.78 0.90 0.82  Sodium 135 - 145 mmol/L 139 141 143  Potassium 3.5 - 5.1 mmol/L 4.7 4.4 4.4  Chloride 98 - 111 mmol/L 109 108 109  CO2 22 - 32 mmol/L 24 27 25   Calcium 8.9 - 10.3 mg/dL 10.3 10.7(H) 11.0(H)  Total Protein 6.5 - 8.1 g/dL 6.0(L) 6.3(L) 6.4(L)  Total Bilirubin 0.3 - 1.2 mg/dL 0.9 1.1 0.8  Alkaline Phos 38 - 126 U/L 68 74 72  AST 15 - 41 U/L 17 19 16   ALT 0 - 44 U/L 18 20 18       RADIOGRAPHIC STUDIES: I have personally reviewed the radiological images as listed and agreed with the findings in the report. No results found.   ASSESSMENT & PLAN:  Philip [name] is a 73 y.o. male with history of  1. 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 -He was evaluated by thoracic  surgeon Dr. Servando Snare, he is not a goodcandidate for surgery,due to his age, comorbidities. He wants to try chemo and radiation. -He is on weekly carbo and Taxol with concurrent radiation, given his early stage disease and limited social support. He tolerated first week treatment well with mild fatigue  -Labs reviewed, mild anemia with hemoglobin 12.2, CMP unremarkable, he is clinically doing well, exam unremarkable, will continue chemoradiation. -f/u weekly   2.Chronic backpain -Heis on tramadol as needed. He will follow-up with his  pain specialist -Patient is concerned about worsening back pain during chemo and radiation  3. HTN -continuelisinopril - follow-up with PCP  4. Obesity   5. Social issues -He lives alone in [place]  6.  Myasthenia gravis -cellcept 1000mg  twice a day for longer term immunosuppression  - He recently saw neurologist and his myasthenia gravis is stable. No double vision. -we previously discussed that his medications and chemo will suppress his immune system, he is at high risk for infections.   Plan  -lab reviewed, adequate for treatment, he will continue radiation and chemotherapy weekly carbo and Taxol  -lab and f/u weekly    No problem-specific Assessment & Plan notes found for this encounter.   No orders of the defined types were placed in this encounter.  All questions were answered. The patient knows to call the clinic with any problems, questions or concerns. No barriers to learning was detected. I spent 10 minutes counseling the patient face to face. The total time spent in the appointment was 15 minutes and more than 50% was on counseling and review of test results  I, Manson Allan am acting as scribe for Dr. Truitt Merle.  I have reviewed the above documentation for accuracy and completeness, and I agree with the above.     Truitt Merle, MD 06/12/2018

## 2018-06-08 ENCOUNTER — Ambulatory Visit
Admission: RE | Admit: 2018-06-08 | Discharge: 2018-06-08 | Disposition: A | Discharge status: Home / Self Care | Payer: Medicare Other | Source: Ambulatory Visit | Attending: Radiation Oncology | Admitting: Radiation Oncology

## 2018-06-08 DIAGNOSIS — Z510 Encounter for antineoplastic radiation therapy: Secondary | ICD-10-CM | POA: Diagnosis not present

## 2018-06-08 DIAGNOSIS — C160 Malignant neoplasm of cardia: Secondary | ICD-10-CM

## 2018-06-08 MED ORDER — SONAFINE EX EMUL
1.0000 "application " | Freq: Once | CUTANEOUS | Status: AC
  Administered 2018-06-08: 1 via TOPICAL

## 2018-06-08 NOTE — Progress Notes (Signed)
Pt here for patient teaching.  Pt given Radiation and You booklet, skin care instructions and Sonafine.  Reviewed areas of pertinence such as fatigue, hair loss, skin changes and throat changes . Pt able to give teach back of to pat skin and use unscented/gentle soap,apply Sonafine bid and avoid applying anything to skin within 4 hours of treatment. Pt verbalizes understanding of information given and will contact nursing with any questions or concerns.     Haidan Nhan M. Trevonne Nyland RN, BSN     

## 2018-06-11 ENCOUNTER — Ambulatory Visit
Admission: RE | Admit: 2018-06-11 | Discharge: 2018-06-11 | Disposition: A | Discharge status: Home / Self Care | Payer: Medicare Other | Source: Ambulatory Visit | Attending: Radiation Oncology | Admitting: Radiation Oncology

## 2018-06-11 ENCOUNTER — Ambulatory Visit (INDEPENDENT_AMBULATORY_CARE_PROVIDER_SITE_OTHER): Payer: Medicare Other | Admitting: Diagnostic Neuroimaging

## 2018-06-11 ENCOUNTER — Encounter: Payer: Self-pay | Admitting: Diagnostic Neuroimaging

## 2018-06-11 ENCOUNTER — Telehealth: Payer: Self-pay | Admitting: *Deleted

## 2018-06-11 VITALS — BP 136/67 | HR 99 | Ht 72.0 in | Wt 305.8 lb

## 2018-06-11 DIAGNOSIS — G7000 Myasthenia gravis without (acute) exacerbation: Secondary | ICD-10-CM | POA: Diagnosis not present

## 2018-06-11 DIAGNOSIS — R0602 Shortness of breath: Secondary | ICD-10-CM

## 2018-06-11 DIAGNOSIS — C160 Malignant neoplasm of cardia: Secondary | ICD-10-CM

## 2018-06-11 DIAGNOSIS — Z510 Encounter for antineoplastic radiation therapy: Secondary | ICD-10-CM | POA: Diagnosis not present

## 2018-06-11 MED ORDER — PYRIDOSTIGMINE BROMIDE 60 MG PO TABS: 30.0000 mg | ORAL_TABLET | Freq: Three times a day (TID) | ORAL | 12 refills | Status: DC

## 2018-06-11 MED ORDER — MYCOPHENOLATE MOFETIL 500 MG PO TABS: 1000.0000 mg | ORAL_TABLET | Freq: Two times a day (BID) | ORAL | 12 refills | Status: DC

## 2018-06-11 NOTE — Telephone Encounter (Signed)
TCT patient for chemotherapy follow up. His 1st treatment with Taxol/carbo was on 06/06/18 Pt states he feels well. Denies any fever, chills, n/v/d.  His only complaint is fatigue. He is also starting radiation therapy. does understand the fatigue is expected with both types of treatment. No questions or concerns. He is aware of his upcoming appts.

## 2018-06-11 NOTE — Patient Instructions (Signed)
MYASTHENIA GRAVIS  - continue cellcept 1000mg  twice a day for longer term immunosuppression - repeat labs CBC, CMP every 3-6 months (for cellcept monitoring)  SHORTNESS OF BREATH (ddx: obesity hypoventilation syndrome, asthma, deconditioning, chemotherapy, myasthenia) - may try pyridostigmine 30-60mg  three times a day to see if fatigue and breathing improve - follow up with PCP, oncology, pulmonology for further evaluation

## 2018-06-11 NOTE — Telephone Encounter (Signed)
-----   Message from Georgianne Fick, RN sent at 06/06/2018  1:25 PM EST ----- Regarding: Dr. Burr Medico First Time Taxol & Carbo Patient received first time Taxol and Carbo today and tolerated these well.

## 2018-06-11 NOTE — Progress Notes (Signed)
[facility]  PATIENT: Philip [name] DOB: 14-Oct-1945  REFERRING CLINICIAN: Delman Cheadle, J HISTORY FROM: patient and chart review REASON FOR VISIT: follow up    HISTORICAL  CHIEF COMPLAINT:  Chief Complaint  Patient presents with  . Myasthenia gravis    rm 7, "doing well"  . Follow-up    6 month    HISTORY OF PRESENT ILLNESS:   UPDATE (06/11/18, VRP): Since last visit, now dx'd with "1. 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 Burr Medico (oncology). He was not felt to be good candidate for surgery, so he has been treated with chemotherapy (x1) and radiation (x3) and so far doing well except for fatigue and some SOB (mainly on exertion).   Myasthenia gravis is stable. No double vision. Tolerating cellcept. Off mestinon since Aug 2019 (at patient's request to see if still needed).   UPDATE (12/04/17, VRP): Since last visit, doing well. Symptoms are resolved. Tolerating cellcept and mestinon. No more double vision. No generalized fatigue.   UPDATE (03/13/17, VRP): Since last visit, doing well. IVIG helped quite a bit. Tolerating pyridostigmine. No alleviating or aggravating factors. No generalized weakness but notes some non-specific "tired" feeling. No double vision or drooping eyelids.   UPDATE (01/17/17, VRP): Since last visit, doing well on pyridostigmine. It worked the next day. AchR ab are positive. Some generalized fatigue and weakness are noted and new since last visit. Went to ER for laryngospasm (?upper resp infx) and tx'd with 4 days prednisone and oral lidocaine.    PRIOR HPI (12/21/16): 73 year old male here for evaluation of double vision. Her past to 3 weeks patient has had intermittent double vision.  Symptoms present with both eyes open but improve if he closes one eye or uses an eye patch. He describes objects as being up and to the side from each other.  They slightly overlap.  This fluctuates.  Symptoms worse later in the  day.  He also had some swallowing difficulties for the past 2 months without a specific cause to be found. He has some generalized fatigue as well. Patient went to [facility] eye clinic, with no specific ocular pathology found.  Patient was referred here for consideration of possible neuromuscular, neuro inflammatory or neuro vascular cause of symptoms. No unilateral numbness or weakness.  No sudden strokelike symptoms.  No headaches.  No prodromal factors.   REVIEW OF SYSTEMS: Full 14 system review of systems performed and negative with exception of: fatigue.  ALLERGIES: No Known Allergies  HOME MEDICATIONS: Outpatient Medications Prior to Visit  Medication Sig Dispense Refill  . ALBUTEROL IN Inhale 2 puffs into the lungs every 4 (four) hours as needed (shortness of breath or wheezing).     [name] amitriptyline (ELAVIL) 25 MG tablet Take 25 mg by mouth daily.     [name] aspirin EC 81 MG tablet Take 81 mg by mouth daily.    [name] atorvastatin (LIPITOR) 10 MG tablet Take 10 mg by mouth at bedtime.     . diclofenac (VOLTAREN) 75 MG EC tablet Take 75 mg by mouth daily as needed.     . Esomeprazole Magnesium 20 MG TBEC Take 40 mg by mouth daily at 12 noon.     [name] lisinopril (PRINIVIL,ZESTRIL) 20 MG tablet Take 20 mg by mouth daily.    . Multiple Vitamin (MULTIVITAMIN) tablet Take 1 tablet by mouth daily.    . mycophenolate (CELLCEPT) 500 MG tablet Take 2 tablets (1,000 mg total) by mouth 2 (two)  times daily. 120 tablet 12  . ondansetron (ZOFRAN) 8 MG tablet Take 1 tablet (8 mg total) by mouth 2 (two) times daily as needed for refractory nausea / vomiting. Start on day 3 after chemo. 30 tablet 1  . prochlorperazine (COMPAZINE) 10 MG tablet Take 1 tablet (10 mg total) by mouth every 6 (six) hours as needed (Nausea or vomiting). 30 tablet 1  . traMADol (ULTRAM) 50 MG tablet Take 50 mg by mouth every 6 (six) hours as needed for pain.     No facility-administered medications prior to visit.     PAST MEDICAL  HISTORY: Past Medical History:  Diagnosis Date  . Abdominal hernia   . Anemia   . Asthma   . Barrett's esophagus   . Cancer (HCC)    Esophagel cancer  . Cataract    Bil surgery  . Cough    over 2 weeks  . Fluid retention in legs   . GERD (gastroesophageal reflux disease)   . H/O hiatal hernia   . Hypertension   . Myasthenia gravis ([place])   . Sleep apnea    don't use c-pap at home/ tried for several years  . SOB (shortness of breath)     PAST SURGICAL HISTORY: Past Surgical History:  Procedure Laterality Date  . BALLOON DILATION N/A 10/19/2016   Procedure: BALLOON DILATION;  Surgeon: Ladene Artist, MD;  Location: [facility];  Service: Endoscopy;  Laterality: N/A;  . COLONOSCOPY W/ POLYPECTOMY    . ESOPHAGOGASTRODUODENOSCOPY N/A 10/19/2016   Procedure: ESOPHAGOGASTRODUODENOSCOPY (EGD);  Surgeon: Ladene Artist, MD;  Location: [facility];  Service: Endoscopy;  Laterality: N/A;  . ESOPHAGOGASTRODUODENOSCOPY (EGD) WITH PROPOFOL N/A 02/12/2018   Procedure: ESOPHAGOGASTRODUODENOSCOPY (EGD) WITH PROPOFOL;  Surgeon: [name], MD;  Location: [facility];  Service: Gastroenterology;  Laterality: N/A;  . EUS N/A 02/12/2018   Procedure: UPPER ENDOSCOPIC ULTRASOUND (EUS) RADIAL;  Surgeon: [name], MD;  Location: [facility];  Service: Gastroenterology;  Laterality: N/A;  . EYE SURGERY Bilateral    cataract  . LUMBAR LAMINECTOMY     1980's    FAMILY HISTORY: Family History  Problem Relation Age of Onset  . Heart disease Mother   . Lung cancer Father   . Colon cancer Neg Hx     SOCIAL HISTORY:  Social History   Socioeconomic History  . Marital status: Single    Spouse name: Not on file  . Number of children: 0  . Years of education: 78  . Highest education level: Not on file  Occupational History    Comment: retired  Scientific laboratory technician  . Financial resource strain: Not on file  . Food insecurity:    Worry: Not on file    Inability: Not  on file  . Transportation needs:    Medical: Yes    Non-medical: Yes  Tobacco Use  . Smoking status: Former Smoker    Packs/day: 1.00    Years: 7.00    Pack years: 7.00    Last attempt to quit: 12/21/1976    Years since quitting: 41.4  . Smokeless tobacco: Never Used  Substance and Sexual Activity  . Alcohol use: Not Currently    Comment: ocassional  . Drug use: No  . Sexual activity: Not on file  Lifestyle  . Physical activity:    Days per week: Not on file    Minutes per session: Not on file  . Stress: Not on file  Relationships  . Social connections:  Talks on phone: Not on file    Gets together: Not on file    Attends religious service: Not on file    Active member of club or organization: Not on file    Attends meetings of clubs or organizations: Not on file    Relationship status: Not on file  . Intimate partner violence:    Fear of current or ex partner: Not on file    Emotionally abused: Not on file    Physically abused: Not on file    Forced sexual activity: Not on file  Other Topics Concern  . Not on file  Social History Narrative   Lives alone   Caffeine- coffee, tea, sodas 1/2 gallon total daily     PHYSICAL EXAM  GENERAL EXAM/CONSTITUTIONAL: Vitals:  Vitals:   06/11/18 1521  BP: 136/67  Pulse: 99  Weight: (!) 305 lb 12.8 oz (138.7 kg)  Height: 6' (1.829 m)   Wt Readings from Last 10 Encounters:  06/11/18 (!) 305 lb 12.8 oz (138.7 kg)  06/06/18 (!) 306 lb 4 oz (138.9 kg)  06/05/18 (!) 304 lb 4.8 oz (138 kg)  05/28/18 298 lb 12.8 oz (135.5 kg)  05/22/18 296 lb (134.3 kg)  05/15/18 297 lb 12.8 oz (135.1 kg)  04/09/18 299 lb (135.6 kg)  03/28/18 299 lb 3.2 oz (135.7 kg)  03/01/18 294 lb (133.4 kg)  02/26/18 289 lb 6.4 oz (131.3 kg)    Body mass index is 41.47 kg/m. No exam data present  Patient is in no distress; well developed, nourished and groomed; neck is supple  CARDIOVASCULAR:  Examination of carotid arteries is normal; no  carotid bruits  REGULAR rate and rhythm, no murmurs  Examination of peripheral vascular system by observation and palpation is normal  EYES:  Ophthalmoscopic exam of optic discs and posterior segments is normal; no papilledema or hemorrhages  MUSCULOSKELETAL:  Gait, strength, tone, movements noted in Neurologic exam below  NEUROLOGIC: MENTAL STATUS:  No flowsheet data found.  awake, alert, oriented to person, place and time  recent and remote memory intact  normal attention and concentration  language fluent, comprehension intact, naming intact,   fund of knowledge appropriate  CRANIAL NERVE:   2nd - no papilledema on fundoscopic exam  2nd, 3rd, 4th, 6th - pupils equal and reactive to light, visual fields full to confrontation, extraocular muscles intact, no nystagmus; NO PTOSIS; NO DOUBLE VISION  5th - facial sensation symmetric  7th - facial strength symmetric  8th - hearing intact  9th - palate elevates symmetrically, uvula midline  11th - shoulder shrug symmetric  12th - tongue protrusion midline  HOARSE VOICE  MOTOR:   normal bulk and tone, full strength in the BUE, BLE  SENSORY:   normal and symmetric to light touch, temperature  ABSENT VIBRATION IN TOES  COORDINATION:   finger-nose-finger, fine finger movements SLOW  REFLEXES:   deep tendon reflexes TRACE and symmetric; ABSENT AT ANKLES  GAIT/STATION:   SMOOTH GAIT    DIAGNOSTIC DATA (LABS, IMAGING, TESTING) - I reviewed patient records, labs, notes, testing and imaging myself where available.  Lab Results  Component Value Date   WBC 6.8 06/05/2018   HGB 12.8 (L) 06/05/2018   HCT 38.6 (L) 06/05/2018   MCV 93.9 06/05/2018   PLT 199 06/05/2018      Component Value Date/Time   NA 141 06/05/2018 1212   NA 138 12/04/2017 1715   K 4.4 06/05/2018 1212   CL 108 06/05/2018 1212  CO2 27 06/05/2018 1212   GLUCOSE 98 06/05/2018 1212   BUN 17 06/05/2018 1212   BUN 19 12/04/2017  1715   CREATININE 0.90 06/05/2018 1212   CALCIUM 10.7 (H) 06/05/2018 1212   PROT 6.3 (L) 06/05/2018 1212   PROT 6.0 12/04/2017 1715   ALBUMIN 3.9 06/05/2018 1212   ALBUMIN 4.0 12/04/2017 1715   AST 19 06/05/2018 1212   ALT 20 06/05/2018 1212   ALKPHOS 74 06/05/2018 1212   BILITOT 1.1 06/05/2018 1212   GFRNONAA >60 06/05/2018 1212   GFRAA >60 06/05/2018 1212   No results found for: CHOL, HDL, LDLCALC, LDLDIRECT, TRIG, CHOLHDL Lab Results  Component Value Date   HGBA1C 5.4 12/21/2016   No results found for: VITAMINB12 Lab Results  Component Value Date   TSH 2.040 12/21/2016    AChR Binding Ab, Serum  Date Value Ref Range Status  12/21/2016 65.60 (H) 0.00 - 0.24 nmol/L Final    Comment:    **Results verified by repeat testing**                                Negative:   0.00 - 0.24                                Borderline: 0.25 - 0.40                                Positive:        > 0.40     01/10/17 MRI brain  - Abnormal MRI scan of the brain showing mild changes of chronic microvascular ischemia and generalized cerebral atrophy. Incidental changes of chronic paranasal sinusitis are noted. 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     ASSESSMENT AND PLAN  73 y.o. year old male here with fatigable, fluctuating, intermittent double vision for past 2-3 weeks, with approximately 2 months of swallowing difficulty.  Also with generalized weakness intermittently.  Has positive AchR antibodies and good response to pyridostigmine and IVIG.  Dx: myasthenia gravis  1. Myasthenia gravis ([place])   2. Shortness of breath   3. Malignant neoplasm of gastroesophageal junction (HCC)      PLAN:  MYASTHENIA GRAVIS  - continue cellcept 1000mg  twice a day for longer term immunosuppression (will avoid prednisone due to potential side effects of weight gait, HTN, hyperglycemia); caution with chemotherapy and cellcept together (risk of infection) - repeat  CBC, CMP every 3-6 months (for cellcept monitoring)  SHORTNESS OF BREATH (ddx: obesity hypoventilation syndrome, asthma, deconditioning, chemotherapy, myasthenia) - may try pyridostigmine 30-60mg  three times a day to see if fatigue and SOB improve - follow up with PCP, oncology, pulmonology for further evaluation  Meds ordered this encounter  Medications  . pyridostigmine (MESTINON) 60 MG tablet    Sig: Take 0.5-1 tablets (30-60 mg total) by mouth 3 (three) times daily.    Dispense:  90 tablet    Refill:  12  . mycophenolate (CELLCEPT) 500 MG tablet    Sig: Take 2 tablets (1,000 mg total) by mouth 2 (two) times daily.    Dispense:  120 tablet    Refill:  12   No follow-ups on file.    Penni Bombard, MD 01/22/3006, 6:22 PM Certified in Neurology, Neurophysiology and Neuroimaging  [facility]  [address] [place], [place] [address] [phone]

## 2018-06-12 ENCOUNTER — Ambulatory Visit
Admission: RE | Admit: 2018-06-12 | Discharge: 2018-06-12 | Disposition: A | Discharge status: Home / Self Care | Payer: Medicare Other | Source: Ambulatory Visit | Attending: Radiation Oncology | Admitting: Radiation Oncology

## 2018-06-12 ENCOUNTER — Telehealth: Payer: Self-pay | Admitting: Hematology

## 2018-06-12 ENCOUNTER — Other Ambulatory Visit: Payer: Medicare Other

## 2018-06-12 ENCOUNTER — Encounter: Payer: Self-pay | Admitting: Hematology

## 2018-06-12 ENCOUNTER — Inpatient Hospital Stay (HOSPITAL_BASED_OUTPATIENT_CLINIC_OR_DEPARTMENT_OTHER): Payer: Medicare Other | Admitting: Hematology

## 2018-06-12 ENCOUNTER — Inpatient Hospital Stay: Payer: Medicare Other

## 2018-06-12 VITALS — BP 125/67 | HR 79 | Temp 97.9°F | Resp 24 | Ht 72.0 in | Wt 303.6 lb

## 2018-06-12 DIAGNOSIS — Z79899 Other long term (current) drug therapy: Secondary | ICD-10-CM

## 2018-06-12 DIAGNOSIS — Z510 Encounter for antineoplastic radiation therapy: Secondary | ICD-10-CM | POA: Diagnosis not present

## 2018-06-12 DIAGNOSIS — E669 Obesity, unspecified: Secondary | ICD-10-CM

## 2018-06-12 DIAGNOSIS — C160 Malignant neoplasm of cardia: Secondary | ICD-10-CM

## 2018-06-12 DIAGNOSIS — M549 Dorsalgia, unspecified: Secondary | ICD-10-CM | POA: Diagnosis not present

## 2018-06-12 DIAGNOSIS — G7000 Myasthenia gravis without (acute) exacerbation: Secondary | ICD-10-CM

## 2018-06-12 DIAGNOSIS — I10 Essential (primary) hypertension: Secondary | ICD-10-CM

## 2018-06-12 DIAGNOSIS — G8929 Other chronic pain: Secondary | ICD-10-CM | POA: Diagnosis not present

## 2018-06-12 DIAGNOSIS — Z5111 Encounter for antineoplastic chemotherapy: Secondary | ICD-10-CM | POA: Diagnosis not present

## 2018-06-12 LAB — CBC WITH DIFFERENTIAL (CANCER CENTER ONLY)
Abs Immature Granulocytes: 0.06 10*3/uL (ref 0.00–0.07)
Basophils Absolute: 0.1 10*3/uL (ref 0.0–0.1)
Basophils Relative: 1 %
Eosinophils Absolute: 0.2 10*3/uL (ref 0.0–0.5)
Eosinophils Relative: 4 %
HCT: 36 % — ABNORMAL LOW (ref 39.0–52.0)
Hemoglobin: 12.2 g/dL — ABNORMAL LOW (ref 13.0–17.0)
Immature Granulocytes: 1 %
Lymphocytes Relative: 15 %
Lymphs Abs: 0.8 10*3/uL (ref 0.7–4.0)
MCH: 31.6 pg (ref 26.0–34.0)
MCHC: 33.9 g/dL (ref 30.0–36.0)
MCV: 93.3 fL (ref 80.0–100.0)
Monocytes Absolute: 0.3 10*3/uL (ref 0.1–1.0)
Monocytes Relative: 6 %
NEUTROS ABS: 3.7 10*3/uL (ref 1.7–7.7)
Neutrophils Relative %: 73 %
Platelet Count: 205 10*3/uL (ref 150–400)
RBC: 3.86 MIL/uL — ABNORMAL LOW (ref 4.22–5.81)
RDW: 13.9 % (ref 11.5–15.5)
WBC Count: 5.1 10*3/uL (ref 4.0–10.5)
nRBC: 0 % (ref 0.0–0.2)

## 2018-06-12 LAB — CMP (CANCER CENTER ONLY)
ALT: 18 U/L (ref 0–44)
AST: 17 U/L (ref 15–41)
Albumin: 3.6 g/dL (ref 3.5–5.0)
Alkaline Phosphatase: 68 U/L (ref 38–126)
Anion gap: 6 (ref 5–15)
BILIRUBIN TOTAL: 0.9 mg/dL (ref 0.3–1.2)
BUN: 14 mg/dL (ref 8–23)
CO2: 24 mmol/L (ref 22–32)
Calcium: 10.3 mg/dL (ref 8.9–10.3)
Chloride: 109 mmol/L (ref 98–111)
Creatinine: 0.78 mg/dL (ref 0.61–1.24)
GFR, Est AFR Am: >60 mL/min (ref >60)
GFR, Estimated: >60 mL/min (ref >60)
Glucose, Bld: 88 mg/dL (ref 70–99)
POTASSIUM: 4.7 mmol/L (ref 3.5–5.1)
Sodium: 139 mmol/L (ref 135–145)
TOTAL PROTEIN: 6 g/dL — AB (ref 6.5–8.1)

## 2018-06-12 MED ORDER — DIPHENHYDRAMINE HCL 50 MG/ML IJ SOLN
50.0000 mg | Freq: Once | INTRAMUSCULAR | Status: AC
  Administered 2018-06-12: 50 mg via INTRAVENOUS

## 2018-06-12 MED ORDER — SODIUM CHLORIDE 0.9 % IV SOLN
50.0000 mg/m2 | Freq: Once | INTRAVENOUS | Status: AC
  Administered 2018-06-12: 132 mg via INTRAVENOUS
  Filled 2018-06-12: qty 22

## 2018-06-12 MED ORDER — SODIUM CHLORIDE 0.9 % IV SOLN
300.0000 mg | Freq: Once | INTRAVENOUS | Status: AC
  Administered 2018-06-12: 300 mg via INTRAVENOUS
  Filled 2018-06-12: qty 30

## 2018-06-12 MED ORDER — PALONOSETRON HCL INJECTION 0.25 MG/5ML
INTRAVENOUS | Status: AC
  Filled 2018-06-12: qty 5

## 2018-06-12 MED ORDER — SODIUM CHLORIDE 0.9 % IV SOLN
Freq: Once | INTRAVENOUS | Status: AC
  Administered 2018-06-12: 14:00:00 via INTRAVENOUS
  Filled 2018-06-12: qty 250

## 2018-06-12 MED ORDER — DIPHENHYDRAMINE HCL 50 MG/ML IJ SOLN
INTRAMUSCULAR | Status: AC
  Filled 2018-06-12: qty 1

## 2018-06-12 MED ORDER — DEXAMETHASONE SODIUM PHOSPHATE 10 MG/ML IJ SOLN
INTRAMUSCULAR | Status: AC
  Filled 2018-06-12: qty 1

## 2018-06-12 MED ORDER — FAMOTIDINE IN NACL 20-0.9 MG/50ML-% IV SOLN
20.0000 mg | Freq: Once | INTRAVENOUS | Status: AC
  Administered 2018-06-12: 20 mg via INTRAVENOUS

## 2018-06-12 MED ORDER — PALONOSETRON HCL INJECTION 0.25 MG/5ML
0.2500 mg | Freq: Once | INTRAVENOUS | Status: AC
  Administered 2018-06-12: 0.25 mg via INTRAVENOUS

## 2018-06-12 MED ORDER — DEXAMETHASONE SODIUM PHOSPHATE 10 MG/ML IJ SOLN
10.0000 mg | Freq: Once | INTRAMUSCULAR | Status: AC
  Administered 2018-06-12: 10 mg via INTRAVENOUS

## 2018-06-12 MED ORDER — FAMOTIDINE IN NACL 20-0.9 MG/50ML-% IV SOLN
INTRAVENOUS | Status: AC
  Filled 2018-06-12: qty 50

## 2018-06-12 NOTE — Telephone Encounter (Signed)
No los per 2/11. 

## 2018-06-12 NOTE — Progress Notes (Signed)
Met with patient to introduce myself as Arboriculturist and to offer available resources. Patient has 2 insurances therefore copay assistance will not be needed.  Discussed one-time $14 Engineer, drilling to assist with personal expenses while going through treatment. He states he is over the income for a household of 1.  Gave my card for any additional financial questions or concerns.

## 2018-06-12 NOTE — Patient Instructions (Addendum)
[facility] Discharge Instructions for Patients Receiving Chemotherapy  Today you received the following chemotherapy agents: Paclitaxel (Taxol) and Carboplatin (Paraplatin)  To help prevent nausea and vomiting after your treatment, we encourage you to take your nausea medication as directed. Received Aloxi during treatment today-->Take your Compazine (not Zofran) for the next 3 days as needed.    If you develop nausea and vomiting that is not controlled by your nausea medication, call the clinic.   BELOW ARE SYMPTOMS THAT SHOULD BE REPORTED IMMEDIATELY:  *FEVER GREATER THAN 100.5 F  *CHILLS WITH OR WITHOUT FEVER  NAUSEA AND VOMITING THAT IS NOT CONTROLLED WITH YOUR NAUSEA MEDICATION  *UNUSUAL SHORTNESS OF BREATH  *UNUSUAL BRUISING OR BLEEDING  TENDERNESS IN MOUTH AND THROAT WITH OR WITHOUT PRESENCE OF ULCERS  *URINARY PROBLEMS  *BOWEL PROBLEMS  UNUSUAL RASH Items with * indicate a potential emergency and should be followed up as soon as possible.  Feel free to call the clinic should you have any questions or concerns. The clinic phone number is [phone].  Please show the CHEMO ALERT CARD at check-in to the Emergency Department and triage nurse.   

## 2018-06-13 ENCOUNTER — Ambulatory Visit
Admission: RE | Admit: 2018-06-13 | Discharge: 2018-06-13 | Disposition: A | Discharge status: Home / Self Care | Payer: Medicare Other | Source: Ambulatory Visit | Attending: Radiation Oncology | Admitting: Radiation Oncology

## 2018-06-13 DIAGNOSIS — Z510 Encounter for antineoplastic radiation therapy: Secondary | ICD-10-CM | POA: Diagnosis not present

## 2018-06-14 ENCOUNTER — Ambulatory Visit
Admission: RE | Admit: 2018-06-14 | Discharge: 2018-06-14 | Disposition: A | Discharge status: Home / Self Care | Payer: Medicare Other | Source: Ambulatory Visit | Attending: Radiation Oncology | Admitting: Radiation Oncology

## 2018-06-14 DIAGNOSIS — Z510 Encounter for antineoplastic radiation therapy: Secondary | ICD-10-CM | POA: Diagnosis not present

## 2018-06-14 NOTE — Progress Notes (Signed)
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    01/22/2018 Procedure    COLONOSCOPY IMPRESSION - Redundant colon. - The examination was otherwise normal on direct and retroflexion views. - No specimens collected.  UPPER ENDOSCOPY IMPRESSION: - Esophageal mucosal changes secondary to established short-segment Barrett's disease. - Mucosal nodule found in the esophagus. Biopsied. - Erythematous mucosa in the prepyloric region of the stomach. - Submucosal nodule found in the duodenum.     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 No esophageal primary identified. No findings of metastatic disease in the chest, abdomen, or pelvis. 2.  Aortic Atherosclerosis (ICD10-I70.0).    02/12/2018 Procedure    EUS Impression: - A mass was found in the distal esophagus into the gastroesophageal junction and extending to proximal cardia. A tissue diagnosis was obtained prior to this exam consistent with adenocarcinoma. This was staged at T2N0Mx, because of a loss of interface of the muscularis propria as the distal GE Junction becomes the  cardia and the mass moves into this region, but there are many more regions and images that suggest a T1 lesion into the submucosa.    05/29/2018 Imaging    NM PET Image Initial (PI) Skull Base To Thigh  IMPRESSION: 1. Hypermetabolic focus at the GE junction consistent with know adenocarcinoma. 2. No evidence of metastatic adenopathy in the mediastinum or upper abdomen. 3. No evidence of liver metastasis.  No pulmonary metastasis.    06/06/2018 -  Chemotherapy    The patient had palonosetron (ALOXI) injection 0.25 mg, 0.25 mg, Intravenous,  Once, 1 of 1 cycle Administration: 0.25 mg (06/06/2018), 0.25 mg (06/12/2018) CARBOplatin (PARAPLATIN) 300 mg in sodium chloride 0.9 % 250 mL chemo infusion, 300 mg (100 % of original dose 300 mg), Intravenous,  Once, 1 of 1 cycle Dose modification:   (original dose 300 mg, Cycle 1) Administration: 300 mg (06/06/2018), 300 mg (06/12/2018) PACLitaxel (TAXOL) 132 mg in sodium chloride 0.9 % 250 mL chemo infusion (</= 80mg /m2), 50 mg/m2 = 132 mg, Intravenous,  Once, 1 of 1 cycle Administration: 132 mg (06/06/2018), 132 mg (06/12/2018)  for chemotherapy treatment.      CURRENT THERAPY  Weeklycarboplatin and Taxol, with concurrent radiationstarting on 06/06/2018   INTERVAL HISTORY: Philip [name] is a 73 y.o. male who is here for follow-up. Today, he is here alone. Lately he is been feeling fatigue but still manages to do things for himself. Since last visit he las lost 10lbs. He describes mild pain when he swallows. He takes pain medication for his back pain. Has a normal BM. Denies nausea, fever, productive cough, or gum bleeding.   Pertinent positives and negatives of review of systems are  listed and detailed within the above HPI.  REVIEW OF SYSTEMS:   Constitutional: Denies fevers, chills, (+)abnormal weight loss, (+) fatigue Eyes: Denies blurriness of vision Ears, nose, mouth, throat, and face: Denies mucositis or sore throat, (+) pain when swallowing    Respiratory: Denies cough, dyspnea or wheezes Cardiovascular: Denies palpitation, chest discomfort or lower extremity swelling Gastrointestinal:  Denies nausea, heartburn or change in bowel habits Skin: Denies abnormal skin rashes Lymphatics: Denies new lymphadenopathy or easy bruising Neurological:Denies numbness, tingling or new weaknesses Behavioral/Psych: Mood is stable, no new changes  Musculoskeletal: (+) back pain  All other systems were reviewed with the patient and are negative.  MEDICAL HISTORY:  Past Medical History:  Diagnosis Date  . Abdominal hernia   . Anemia   . Asthma   . Barrett's esophagus   . Cancer (HCC)    Esophagel cancer  . Cataract    Bil surgery  . Cough    over 2 weeks  . Fluid retention in legs   . GERD (gastroesophageal reflux disease)   . H/O hiatal hernia   . Hypertension   . Myasthenia gravis ([place])   . Sleep apnea    don't use c-pap at home/ tried for several years  . SOB (shortness of breath)     SURGICAL HISTORY: Past Surgical History:  Procedure Laterality Date  . BALLOON DILATION N/A 10/19/2016   Procedure: BALLOON DILATION;  Surgeon: Ladene Artist, MD;  Location: [facility];  Service: Endoscopy;  Laterality: N/A;  . COLONOSCOPY W/ POLYPECTOMY    . ESOPHAGOGASTRODUODENOSCOPY N/A 10/19/2016   Procedure: ESOPHAGOGASTRODUODENOSCOPY (EGD);  Surgeon: Ladene Artist, MD;  Location: [facility];  Service: Endoscopy;  Laterality: N/A;  . ESOPHAGOGASTRODUODENOSCOPY (EGD) WITH PROPOFOL N/A 02/12/2018   Procedure: ESOPHAGOGASTRODUODENOSCOPY (EGD) WITH PROPOFOL;  Surgeon: [name], MD;  Location: [place];  Service: Gastroenterology;  Laterality: N/A;  . EUS N/A 02/12/2018   Procedure: UPPER ENDOSCOPIC ULTRASOUND (EUS) RADIAL;  Surgeon: [name], MD;  Location: [place];  Service: Gastroenterology;  Laterality: N/A;  . EYE SURGERY Bilateral    cataract  . LUMBAR LAMINECTOMY     1980's    I have  reviewed the social history and family history with the patient and they are unchanged from previous note.  ALLERGIES:  has No Known Allergies.  MEDICATIONS:  Current Outpatient Medications  Medication Sig Dispense Refill  . ALBUTEROL IN Inhale 2 puffs into the lungs every 4 (four) hours as needed (shortness of breath or wheezing).     [name] amitriptyline (ELAVIL) 25 MG tablet Take 25 mg by mouth daily.     [name] aspirin EC 81 MG tablet Take 81 mg by mouth daily.    [name] atorvastatin (LIPITOR) 10 MG tablet Take 10 mg by mouth at bedtime.     . diclofenac (VOLTAREN) 75 MG EC tablet Take 75 mg by mouth daily as needed.     . Esomeprazole Magnesium 20 MG TBEC Take 40 mg by mouth daily at 12 noon.     [name] lisinopril (PRINIVIL,ZESTRIL) 20 MG tablet Take 20 mg by mouth daily.    . Multiple Vitamin (MULTIVITAMIN) tablet Take 1 tablet by mouth daily.    . mycophenolate (CELLCEPT) 500 MG tablet Take 2 tablets (1,000 mg total) by mouth 2 (two) times daily. 120 tablet 12  . ondansetron (ZOFRAN) 8 MG tablet Take 1 tablet (8 mg total) by mouth 2 (two) times daily as needed for refractory nausea / vomiting. Start on day  3 after chemo. 30 tablet 1  . prochlorperazine (COMPAZINE) 10 MG tablet Take 1 tablet (10 mg total) by mouth every 6 (six) hours as needed (Nausea or vomiting). 30 tablet 1  . pyridostigmine (MESTINON) 60 MG tablet Take 0.5-1 tablets (30-60 mg total) by mouth 3 (three) times daily. 90 tablet 12  . traMADol (ULTRAM) 50 MG tablet Take 50 mg by mouth every 6 (six) hours as needed for pain.    [name] sucralfate (CARAFATE) 1 g tablet Take 1 tablet (1 g total) by mouth 4 (four) times daily -  with meals and at bedtime. 60 tablet 1   No current facility-administered medications for this visit.     PHYSICAL EXAMINATION: ECOG PERFORMANCE STATUS: 1 - Symptomatic but completely ambulatory  Vitals:   06/19/18 1339  BP: 129/71  Pulse: 98  Resp: 19  Temp: 97.9 F (36.6 C)  SpO2: 100%   Filed Weights    06/19/18 1339  Weight: 293 lb 12.8 oz (133.3 kg)    GENERAL:alert, no distress and comfortable SKIN: skin color, texture, turgor are normal, no rashes or significant lesions EYES: normal, Conjunctiva are pink and non-injected, sclera clear OROPHARYNX:no exudate, no erythema and lips, buccal mucosa, and tongue normal  NECK: supple, thyroid normal size, non-tender, without nodularity LYMPH:  no palpable lymphadenopathy in the cervical, axillary or inguinal LUNGS: clear to auscultation and percussion with normal breathing effort HEART: regular rate & rhythm and no murmurs, (+) lower extremity edema ABDOMEN:abdomen soft,  and normal bowel sounds, (+) tender lower abdominal  Musculoskeletal:no cyanosis of digits and no clubbing NEURO: alert & oriented x 3 with fluent speech, no focal motor/sensory deficits  LABORATORY DATA:  I have reviewed the data as listed CBC Latest Ref Rng & Units 06/19/2018 06/12/2018 06/05/2018  WBC 4.0 - 10.5 K/uL 4.3 5.1 6.8  Hemoglobin 13.0 - 17.0 g/dL 13.1 12.2(L) 12.8(L)  Hematocrit 39.0 - 52.0 % 38.7(L) 36.0(L) 38.6(L)  Platelets 150 - 400 K/uL 223 205 199     CMP Latest Ref Rng & Units 06/19/2018 06/12/2018 06/05/2018  Glucose 70 - 99 mg/dL 105(H) 88 98  BUN 8 - 23 mg/dL 12 14 17   Creatinine 0.61 - 1.24 mg/dL 0.84 0.78 0.90  Sodium 135 - 145 mmol/L 140 139 141  Potassium 3.5 - 5.1 mmol/L 4.3 4.7 4.4  Chloride 98 - 111 mmol/L 110 109 108  CO2 22 - 32 mmol/L 23 24 27   Calcium 8.9 - 10.3 mg/dL 10.9(H) 10.3 10.7(H)  Total Protein 6.5 - 8.1 g/dL 6.4(L) 6.0(L) 6.3(L)  Total Bilirubin 0.3 - 1.2 mg/dL 0.9 0.9 1.1  Alkaline Phos 38 - 126 U/L 68 68 74  AST 15 - 41 U/L 21 17 19   ALT 0 - 44 U/L 23 18 20       RADIOGRAPHIC STUDIES: I have personally reviewed the radiological images as listed and agreed with the findings in the report. No results found.   ASSESSMENT & PLAN:  Philip [name] is a 73 y.o. male with history of  1. 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 -He was evaluated by thoracic surgeon Dr. Hilton Sinclair is not agoodcandidate for surgery,due to his age, comorbidities. He wants to try chemo and radiation. -He is on weekly carbo and Taxol with concurrent radiation. He is tolerating treatment well with mild to moderate fatigue  -He has lost more weight, we discussed dietary supplement -Mild odynophagia, I  will prescribe  Sucralfate today. -Lab reviewed, adequate for treatment, will proceed weeks 3 chemo carbo and Taxol today  2.Chronic backpain -Heis on tramadol as needed.  - F/u with pain specialist  3. HTN -continuelisinopril -follow-up with PCP  4. Obesity   5. Social issues -He lives alone in [place] -He is able to take care of himself at home  6.Myasthenia gravis -cellcept 1000mg  twice a day for longer term immunosuppression  --His myasthenia gravis is stable. No double vision. -we previously discussed that his medications and chemo willsuppress his immune system, he is at high risk for infections.   Plan - I prescribed Sucralfate 1 g tablet QID for his odynophagia -lab reviewed, he will continue radiation and chemotherapy weekly carbo and Taxol  -lab and f/u next week    No problem-specific Assessment & Plan notes found for this encounter.   No orders of the defined types were placed in this encounter.  All questions were answered. The patient knows to call the clinic with any problems, questions or concerns. No barriers to learning was detected. I spent 15 minutes counseling the patient face to face. The total time spent in the appointment was 20 minutes and more than 50% was on counseling and review of test results  I, Manson Allan am acting as scribe for Dr. Truitt Merle.  I have reviewed the above documentation for accuracy and  completeness, and I agree with the above.     Truitt Merle, MD 06/19/2018

## 2018-06-15 ENCOUNTER — Ambulatory Visit
Admission: RE | Admit: 2018-06-15 | Discharge: 2018-06-15 | Disposition: A | Discharge status: Home / Self Care | Payer: Medicare Other | Source: Ambulatory Visit | Attending: Radiation Oncology | Admitting: Radiation Oncology

## 2018-06-15 DIAGNOSIS — Z510 Encounter for antineoplastic radiation therapy: Secondary | ICD-10-CM | POA: Diagnosis not present

## 2018-06-18 ENCOUNTER — Ambulatory Visit
Admission: RE | Admit: 2018-06-18 | Discharge: 2018-06-18 | Disposition: A | Discharge status: Home / Self Care | Payer: Medicare Other | Source: Ambulatory Visit | Attending: Radiation Oncology | Admitting: Radiation Oncology

## 2018-06-18 DIAGNOSIS — Z510 Encounter for antineoplastic radiation therapy: Secondary | ICD-10-CM | POA: Diagnosis not present

## 2018-06-19 ENCOUNTER — Ambulatory Visit: Payer: Medicare Other | Admitting: Hematology

## 2018-06-19 ENCOUNTER — Ambulatory Visit
Admission: RE | Admit: 2018-06-19 | Discharge: 2018-06-19 | Disposition: A | Discharge status: Home / Self Care | Payer: Medicare Other | Source: Ambulatory Visit | Attending: Radiation Oncology | Admitting: Radiation Oncology

## 2018-06-19 ENCOUNTER — Encounter: Payer: Self-pay | Admitting: Hematology

## 2018-06-19 ENCOUNTER — Inpatient Hospital Stay (HOSPITAL_BASED_OUTPATIENT_CLINIC_OR_DEPARTMENT_OTHER): Payer: Medicare Other | Admitting: Hematology

## 2018-06-19 ENCOUNTER — Inpatient Hospital Stay: Payer: Medicare Other

## 2018-06-19 ENCOUNTER — Other Ambulatory Visit: Payer: Medicare Other

## 2018-06-19 VITALS — BP 129/71 | HR 98 | Temp 97.9°F | Resp 19 | Ht 72.0 in | Wt 293.8 lb

## 2018-06-19 DIAGNOSIS — C160 Malignant neoplasm of cardia: Secondary | ICD-10-CM | POA: Diagnosis not present

## 2018-06-19 DIAGNOSIS — G7000 Myasthenia gravis without (acute) exacerbation: Secondary | ICD-10-CM

## 2018-06-19 DIAGNOSIS — I10 Essential (primary) hypertension: Secondary | ICD-10-CM | POA: Diagnosis not present

## 2018-06-19 DIAGNOSIS — E669 Obesity, unspecified: Secondary | ICD-10-CM

## 2018-06-19 DIAGNOSIS — Z79899 Other long term (current) drug therapy: Secondary | ICD-10-CM

## 2018-06-19 DIAGNOSIS — G8929 Other chronic pain: Secondary | ICD-10-CM | POA: Diagnosis not present

## 2018-06-19 DIAGNOSIS — M549 Dorsalgia, unspecified: Secondary | ICD-10-CM | POA: Diagnosis not present

## 2018-06-19 DIAGNOSIS — Z5111 Encounter for antineoplastic chemotherapy: Secondary | ICD-10-CM | POA: Diagnosis not present

## 2018-06-19 DIAGNOSIS — Z510 Encounter for antineoplastic radiation therapy: Secondary | ICD-10-CM | POA: Diagnosis not present

## 2018-06-19 LAB — CMP (CANCER CENTER ONLY)
ALT: 23 U/L (ref 0–44)
AST: 21 U/L (ref 15–41)
Albumin: 3.9 g/dL (ref 3.5–5.0)
Alkaline Phosphatase: 68 U/L (ref 38–126)
Anion gap: 7 (ref 5–15)
BUN: 12 mg/dL (ref 8–23)
CO2: 23 mmol/L (ref 22–32)
Calcium: 10.9 mg/dL — ABNORMAL HIGH (ref 8.9–10.3)
Chloride: 110 mmol/L (ref 98–111)
Creatinine: 0.84 mg/dL (ref 0.61–1.24)
GFR, Est AFR Am: >60 mL/min (ref >60)
GFR, Estimated: >60 mL/min (ref >60)
Glucose, Bld: 105 mg/dL — ABNORMAL HIGH (ref 70–99)
Potassium: 4.3 mmol/L (ref 3.5–5.1)
Sodium: 140 mmol/L (ref 135–145)
TOTAL PROTEIN: 6.4 g/dL — AB (ref 6.5–8.1)
Total Bilirubin: 0.9 mg/dL (ref 0.3–1.2)

## 2018-06-19 LAB — CBC WITH DIFFERENTIAL (CANCER CENTER ONLY)
Abs Immature Granulocytes: 0.03 10*3/uL (ref 0.00–0.07)
BASOS PCT: 1 %
Basophils Absolute: 0.1 10*3/uL (ref 0.0–0.1)
Eosinophils Absolute: 0.1 10*3/uL (ref 0.0–0.5)
Eosinophils Relative: 2 %
HCT: 38.7 % — ABNORMAL LOW (ref 39.0–52.0)
Hemoglobin: 13.1 g/dL (ref 13.0–17.0)
Immature Granulocytes: 1 %
Lymphocytes Relative: 15 %
Lymphs Abs: 0.7 10*3/uL (ref 0.7–4.0)
MCH: 31.9 pg (ref 26.0–34.0)
MCHC: 33.9 g/dL (ref 30.0–36.0)
MCV: 94.2 fL (ref 80.0–100.0)
Monocytes Absolute: 0.3 10*3/uL (ref 0.1–1.0)
Monocytes Relative: 7 %
Neutro Abs: 3.2 10*3/uL (ref 1.7–7.7)
Neutrophils Relative %: 74 %
Platelet Count: 223 10*3/uL (ref 150–400)
RBC: 4.11 MIL/uL — ABNORMAL LOW (ref 4.22–5.81)
RDW: 14 % (ref 11.5–15.5)
WBC Count: 4.3 10*3/uL (ref 4.0–10.5)
nRBC: 0 % (ref 0.0–0.2)

## 2018-06-19 MED ORDER — SUCRALFATE 1 G PO TABS: 1.0000 g | ORAL_TABLET | Freq: Three times a day (TID) | ORAL | 1 refills | Status: DC

## 2018-06-19 MED ORDER — DEXAMETHASONE SODIUM PHOSPHATE 10 MG/ML IJ SOLN
10.0000 mg | Freq: Once | INTRAMUSCULAR | Status: AC
  Administered 2018-06-19: 10 mg via INTRAVENOUS

## 2018-06-19 MED ORDER — SODIUM CHLORIDE 0.9 % IV SOLN
300.0000 mg | Freq: Once | INTRAVENOUS | Status: AC
  Administered 2018-06-19: 300 mg via INTRAVENOUS
  Filled 2018-06-19: qty 30

## 2018-06-19 MED ORDER — SODIUM CHLORIDE 0.9 % IV SOLN
50.0000 mg/m2 | Freq: Once | INTRAVENOUS | Status: AC
  Administered 2018-06-19: 132 mg via INTRAVENOUS
  Filled 2018-06-19: qty 22

## 2018-06-19 MED ORDER — PALONOSETRON HCL INJECTION 0.25 MG/5ML
INTRAVENOUS | Status: AC
  Filled 2018-06-19: qty 5

## 2018-06-19 MED ORDER — PALONOSETRON HCL INJECTION 0.25 MG/5ML
0.2500 mg | Freq: Once | INTRAVENOUS | Status: AC
  Administered 2018-06-19: 0.25 mg via INTRAVENOUS

## 2018-06-19 MED ORDER — DIPHENHYDRAMINE HCL 50 MG/ML IJ SOLN
INTRAMUSCULAR | Status: AC
  Filled 2018-06-19: qty 1

## 2018-06-19 MED ORDER — SODIUM CHLORIDE 0.9 % IV SOLN
Freq: Once | INTRAVENOUS | Status: AC
  Administered 2018-06-19: 15:00:00 via INTRAVENOUS
  Filled 2018-06-19: qty 250

## 2018-06-19 MED ORDER — FAMOTIDINE IN NACL 20-0.9 MG/50ML-% IV SOLN
INTRAVENOUS | Status: AC
  Filled 2018-06-19: qty 50

## 2018-06-19 MED ORDER — FAMOTIDINE IN NACL 20-0.9 MG/50ML-% IV SOLN
20.0000 mg | Freq: Once | INTRAVENOUS | Status: AC
  Administered 2018-06-19: 20 mg via INTRAVENOUS

## 2018-06-19 MED ORDER — DIPHENHYDRAMINE HCL 50 MG/ML IJ SOLN
50.0000 mg | Freq: Once | INTRAMUSCULAR | Status: AC
  Administered 2018-06-19: 50 mg via INTRAVENOUS

## 2018-06-19 MED ORDER — DEXAMETHASONE SODIUM PHOSPHATE 10 MG/ML IJ SOLN
INTRAMUSCULAR | Status: AC
  Filled 2018-06-19: qty 1

## 2018-06-19 NOTE — Patient Instructions (Signed)
   [facility] Discharge Instructions for Patients Receiving Chemotherapy  Today you received the following chemotherapy agents Taxol and Carboplatin   To help prevent nausea and vomiting after your treatment, we encourage you to take your nausea medication as directed.    If you develop nausea and vomiting that is not controlled by your nausea medication, call the clinic.   BELOW ARE SYMPTOMS THAT SHOULD BE REPORTED IMMEDIATELY:  *FEVER GREATER THAN 100.5 F  *CHILLS WITH OR WITHOUT FEVER  NAUSEA AND VOMITING THAT IS NOT CONTROLLED WITH YOUR NAUSEA MEDICATION  *UNUSUAL SHORTNESS OF BREATH  *UNUSUAL BRUISING OR BLEEDING  TENDERNESS IN MOUTH AND THROAT WITH OR WITHOUT PRESENCE OF ULCERS  *URINARY PROBLEMS  *BOWEL PROBLEMS  UNUSUAL RASH Items with * indicate a potential emergency and should be followed up as soon as possible.  Feel free to call the clinic should you have any questions or concerns. The clinic phone number is [phone].  Please show the CHEMO ALERT CARD at check-in to the Emergency Department and triage nurse.   

## 2018-06-20 ENCOUNTER — Ambulatory Visit
Admission: RE | Admit: 2018-06-20 | Discharge: 2018-06-20 | Disposition: A | Discharge status: Home / Self Care | Payer: Medicare Other | Source: Ambulatory Visit | Attending: Radiation Oncology | Admitting: Radiation Oncology

## 2018-06-20 DIAGNOSIS — Z510 Encounter for antineoplastic radiation therapy: Secondary | ICD-10-CM | POA: Diagnosis not present

## 2018-06-21 ENCOUNTER — Ambulatory Visit
Admission: RE | Admit: 2018-06-21 | Discharge: 2018-06-21 | Disposition: A | Discharge status: Home / Self Care | Payer: Medicare Other | Source: Ambulatory Visit | Attending: Radiation Oncology | Admitting: Radiation Oncology

## 2018-06-21 DIAGNOSIS — Z510 Encounter for antineoplastic radiation therapy: Secondary | ICD-10-CM | POA: Diagnosis not present

## 2018-06-22 ENCOUNTER — Ambulatory Visit
Admission: RE | Admit: 2018-06-22 | Discharge: 2018-06-22 | Disposition: A | Discharge status: Home / Self Care | Payer: Medicare Other | Source: Ambulatory Visit | Attending: Radiation Oncology | Admitting: Radiation Oncology

## 2018-06-22 DIAGNOSIS — Z510 Encounter for antineoplastic radiation therapy: Secondary | ICD-10-CM | POA: Diagnosis not present

## 2018-06-25 ENCOUNTER — Ambulatory Visit
Admission: RE | Admit: 2018-06-25 | Discharge: 2018-06-25 | Disposition: A | Discharge status: Home / Self Care | Payer: Medicare Other | Source: Ambulatory Visit | Attending: Radiation Oncology | Admitting: Radiation Oncology

## 2018-06-25 DIAGNOSIS — Z510 Encounter for antineoplastic radiation therapy: Secondary | ICD-10-CM | POA: Diagnosis not present

## 2018-06-26 ENCOUNTER — Ambulatory Visit
Admission: RE | Admit: 2018-06-26 | Discharge: 2018-06-26 | Disposition: A | Discharge status: Home / Self Care | Payer: Medicare Other | Source: Ambulatory Visit | Attending: Radiation Oncology | Admitting: Radiation Oncology

## 2018-06-26 ENCOUNTER — Inpatient Hospital Stay (HOSPITAL_BASED_OUTPATIENT_CLINIC_OR_DEPARTMENT_OTHER): Payer: Medicare Other | Admitting: Medical

## 2018-06-26 ENCOUNTER — Inpatient Hospital Stay: Payer: Medicare Other

## 2018-06-26 DIAGNOSIS — M549 Dorsalgia, unspecified: Secondary | ICD-10-CM | POA: Diagnosis not present

## 2018-06-26 DIAGNOSIS — Z510 Encounter for antineoplastic radiation therapy: Secondary | ICD-10-CM | POA: Diagnosis not present

## 2018-06-26 DIAGNOSIS — E669 Obesity, unspecified: Secondary | ICD-10-CM

## 2018-06-26 DIAGNOSIS — Z5111 Encounter for antineoplastic chemotherapy: Secondary | ICD-10-CM | POA: Diagnosis not present

## 2018-06-26 DIAGNOSIS — G7000 Myasthenia gravis without (acute) exacerbation: Secondary | ICD-10-CM

## 2018-06-26 DIAGNOSIS — C160 Malignant neoplasm of cardia: Secondary | ICD-10-CM

## 2018-06-26 DIAGNOSIS — I10 Essential (primary) hypertension: Secondary | ICD-10-CM

## 2018-06-26 DIAGNOSIS — G8929 Other chronic pain: Secondary | ICD-10-CM

## 2018-06-26 DIAGNOSIS — Z79899 Other long term (current) drug therapy: Secondary | ICD-10-CM

## 2018-06-26 LAB — CBC WITH DIFFERENTIAL (CANCER CENTER ONLY)
Abs Immature Granulocytes: 0.02 10*3/uL (ref 0.00–0.07)
Basophils Absolute: 0.1 10*3/uL (ref 0.0–0.1)
Basophils Relative: 2 %
Eosinophils Absolute: 0 10*3/uL (ref 0.0–0.5)
Eosinophils Relative: 1 %
HCT: 36.9 % — ABNORMAL LOW (ref 39.0–52.0)
Hemoglobin: 12.8 g/dL — ABNORMAL LOW (ref 13.0–17.0)
IMMATURE GRANULOCYTES: 1 %
LYMPHS ABS: 0.4 10*3/uL — AB (ref 0.7–4.0)
Lymphocytes Relative: 11 %
MCH: 31.4 pg (ref 26.0–34.0)
MCHC: 34.7 g/dL (ref 30.0–36.0)
MCV: 90.7 fL (ref 80.0–100.0)
Monocytes Absolute: 0.6 10*3/uL (ref 0.1–1.0)
Monocytes Relative: 18 %
NRBC: 0 % (ref 0.0–0.2)
Neutro Abs: 2.4 10*3/uL (ref 1.7–7.7)
Neutrophils Relative %: 67 %
Platelet Count: 197 10*3/uL (ref 150–400)
RBC: 4.07 MIL/uL — ABNORMAL LOW (ref 4.22–5.81)
RDW: 14.6 % (ref 11.5–15.5)
WBC Count: 3.5 10*3/uL — ABNORMAL LOW (ref 4.0–10.5)

## 2018-06-26 LAB — CMP (CANCER CENTER ONLY)
ALT: 29 U/L (ref 0–44)
AST: 28 U/L (ref 15–41)
Albumin: 3.7 g/dL (ref 3.5–5.0)
Alkaline Phosphatase: 72 U/L (ref 38–126)
Anion gap: 10 (ref 5–15)
BUN: 39 mg/dL — ABNORMAL HIGH (ref 8–23)
CO2: 20 mmol/L — ABNORMAL LOW (ref 22–32)
Calcium: 11.2 mg/dL — ABNORMAL HIGH (ref 8.9–10.3)
Chloride: 101 mmol/L (ref 98–111)
Creatinine: 1.12 mg/dL (ref 0.61–1.24)
GFR, Est AFR Am: >60 mL/min (ref >60)
GFR, Estimated: >60 mL/min (ref >60)
Glucose, Bld: 105 mg/dL — ABNORMAL HIGH (ref 70–99)
Potassium: 4.5 mmol/L (ref 3.5–5.1)
SODIUM: 131 mmol/L — AB (ref 135–145)
Total Bilirubin: 1.1 mg/dL (ref 0.3–1.2)
Total Protein: 6.5 g/dL (ref 6.5–8.1)

## 2018-06-26 MED ORDER — FAMOTIDINE IN NACL 20-0.9 MG/50ML-% IV SOLN: 20.0000 mg | Freq: Once | INTRAVENOUS | Status: DC

## 2018-06-26 MED ORDER — PALONOSETRON HCL INJECTION 0.25 MG/5ML
0.2500 mg | Freq: Once | INTRAVENOUS | Status: AC
  Administered 2018-06-26: 0.25 mg via INTRAVENOUS

## 2018-06-26 MED ORDER — DEXAMETHASONE SODIUM PHOSPHATE 10 MG/ML IJ SOLN
10.0000 mg | Freq: Once | INTRAMUSCULAR | Status: AC
  Administered 2018-06-26: 10 mg via INTRAVENOUS

## 2018-06-26 MED ORDER — SODIUM CHLORIDE 0.9 % IV SOLN
50.0000 mg/m2 | Freq: Once | INTRAVENOUS | Status: AC
  Administered 2018-06-26: 132 mg via INTRAVENOUS
  Filled 2018-06-26: qty 22

## 2018-06-26 MED ORDER — ZOLEDRONIC ACID 4 MG/5ML IV CONC: 4.0000 mg | Freq: Once | INTRAVENOUS | Status: DC

## 2018-06-26 MED ORDER — PALONOSETRON HCL INJECTION 0.25 MG/5ML
INTRAVENOUS | Status: AC
  Filled 2018-06-26: qty 5

## 2018-06-26 MED ORDER — SODIUM CHLORIDE 0.9% FLUSH
10.0000 mL | INTRAVENOUS | Status: DC | PRN
  Filled 2018-06-26: qty 10

## 2018-06-26 MED ORDER — SODIUM CHLORIDE 0.9 % IV SOLN
278.6000 mg | Freq: Once | INTRAVENOUS | Status: AC
  Administered 2018-06-26: 280 mg via INTRAVENOUS
  Filled 2018-06-26: qty 28

## 2018-06-26 MED ORDER — SODIUM CHLORIDE 0.9 % IV SOLN
INTRAVENOUS | Status: AC
  Administered 2018-06-26: 12:00:00 via INTRAVENOUS
  Filled 2018-06-26: qty 250

## 2018-06-26 MED ORDER — DEXAMETHASONE SODIUM PHOSPHATE 10 MG/ML IJ SOLN
INTRAMUSCULAR | Status: AC
  Filled 2018-06-26: qty 1

## 2018-06-26 MED ORDER — DIPHENHYDRAMINE HCL 50 MG/ML IJ SOLN
50.0000 mg | Freq: Once | INTRAMUSCULAR | Status: AC
  Administered 2018-06-26: 50 mg via INTRAVENOUS

## 2018-06-26 MED ORDER — DIPHENHYDRAMINE HCL 50 MG/ML IJ SOLN
INTRAMUSCULAR | Status: AC
  Filled 2018-06-26: qty 1

## 2018-06-26 MED ORDER — HEPARIN SOD (PORK) LOCK FLUSH 100 UNIT/ML IV SOLN
500.0000 [IU] | Freq: Once | INTRAVENOUS | Status: DC | PRN
  Filled 2018-06-26: qty 5

## 2018-06-26 MED ORDER — SODIUM CHLORIDE 0.9 % IV SOLN
Freq: Once | INTRAVENOUS | Status: AC
  Administered 2018-06-26: 13:00:00 via INTRAVENOUS
  Filled 2018-06-26: qty 250

## 2018-06-26 MED ORDER — SODIUM CHLORIDE 0.9 % IV SOLN
20.0000 mg | Freq: Once | INTRAVENOUS | Status: AC
  Administered 2018-06-26: 20 mg via INTRAVENOUS
  Filled 2018-06-26: qty 2

## 2018-06-26 NOTE — Progress Notes (Signed)
Zometa 4 mg ordered late in the day. I contacted infusion & s/w Laurence Compton, RN.  The order start date is for 07/03/18. Pt will get the Zometa next week per [facility]. Kennith Center, Pharm.D., CPP 06/26/2018@5 :30 PM

## 2018-06-26 NOTE — Patient Instructions (Signed)
   [facility] Discharge Instructions for Patients Receiving Chemotherapy  Today you received the following chemotherapy agents Taxol and Carboplatin   To help prevent nausea and vomiting after your treatment, we encourage you to take your nausea medication as directed.    If you develop nausea and vomiting that is not controlled by your nausea medication, call the clinic.   BELOW ARE SYMPTOMS THAT SHOULD BE REPORTED IMMEDIATELY:  *FEVER GREATER THAN 100.5 F  *CHILLS WITH OR WITHOUT FEVER  NAUSEA AND VOMITING THAT IS NOT CONTROLLED WITH YOUR NAUSEA MEDICATION  *UNUSUAL SHORTNESS OF BREATH  *UNUSUAL BRUISING OR BLEEDING  TENDERNESS IN MOUTH AND THROAT WITH OR WITHOUT PRESENCE OF ULCERS  *URINARY PROBLEMS  *BOWEL PROBLEMS  UNUSUAL RASH Items with * indicate a potential emergency and should be followed up as soon as possible.  Feel free to call the clinic should you have any questions or concerns. The clinic phone number is [phone].  Please show the CHEMO ALERT CARD at check-in to the Emergency Department and triage nurse.   

## 2018-06-26 NOTE — Progress Notes (Signed)
Per Sandi Mealy, PA-C ok to treat with elevated calcium. Will administer 500 mL NS bolus.

## 2018-06-27 ENCOUNTER — Ambulatory Visit
Admission: RE | Admit: 2018-06-27 | Discharge: 2018-06-27 | Disposition: A | Discharge status: Home / Self Care | Payer: Medicare Other | Source: Ambulatory Visit | Attending: Radiation Oncology | Admitting: Radiation Oncology

## 2018-06-27 DIAGNOSIS — Z510 Encounter for antineoplastic radiation therapy: Secondary | ICD-10-CM | POA: Diagnosis not present

## 2018-06-28 ENCOUNTER — Other Ambulatory Visit: Payer: Self-pay | Admitting: Medical

## 2018-06-28 ENCOUNTER — Inpatient Hospital Stay: Payer: Medicare Other

## 2018-06-28 ENCOUNTER — Ambulatory Visit: Payer: Medicare Other

## 2018-06-28 ENCOUNTER — Other Ambulatory Visit: Payer: Medicare Other

## 2018-06-28 ENCOUNTER — Ambulatory Visit
Admission: RE | Admit: 2018-06-28 | Discharge: 2018-06-28 | Disposition: A | Discharge status: Home / Self Care | Payer: Medicare Other | Source: Ambulatory Visit | Attending: Radiation Oncology | Admitting: Radiation Oncology

## 2018-06-28 DIAGNOSIS — Z5111 Encounter for antineoplastic chemotherapy: Secondary | ICD-10-CM | POA: Diagnosis not present

## 2018-06-28 DIAGNOSIS — Z510 Encounter for antineoplastic radiation therapy: Secondary | ICD-10-CM | POA: Diagnosis not present

## 2018-06-28 MED ORDER — SODIUM CHLORIDE 0.9 % IV SOLN
Freq: Once | INTRAVENOUS | Status: AC
  Administered 2018-06-28: 15:00:00 via INTRAVENOUS
  Filled 2018-06-28: qty 250

## 2018-06-28 NOTE — Progress Notes (Signed)
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    01/22/2018 Procedure    COLONOSCOPY IMPRESSION - Redundant colon. - The examination was otherwise normal on direct and retroflexion views. - No specimens collected.  UPPER ENDOSCOPY IMPRESSION: - Esophageal mucosal changes secondary to established short-segment Barrett's disease. - Mucosal nodule found in the esophagus. Biopsied. - Erythematous mucosa in the prepyloric region of the stomach. - Submucosal nodule found in the duodenum.     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 No esophageal primary identified. No findings of metastatic disease in the chest, abdomen, or pelvis. 2.  Aortic Atherosclerosis (ICD10-I70.0).    02/12/2018 Procedure    EUS Impression: - A mass was found in the distal esophagus into the gastroesophageal junction and extending to proximal cardia. A tissue diagnosis was obtained prior to this exam consistent with adenocarcinoma. This was staged at T2N0Mx, because of a loss of interface of the muscularis propria as the distal GE Junction becomes the cardia  and the mass moves into this region, but there are many more regions and images that suggest a T1 lesion into the submucosa.    05/29/2018 Imaging    NM PET Image Initial (PI) Skull Base To Thigh  IMPRESSION: 1. Hypermetabolic focus at the GE junction consistent with know adenocarcinoma. 2. No evidence of metastatic adenopathy in the mediastinum or upper abdomen. 3. No evidence of liver metastasis.  No pulmonary metastasis.    06/06/2018 -  Chemotherapy    The patient had palonosetron (ALOXI) injection 0.25 mg, 0.25 mg, Intravenous,  Once, 1 of 1 cycle Administration: 0.25 mg (06/06/2018), 0.25 mg (06/12/2018), 0.25 mg (06/19/2018), 0.25 mg (06/26/2018) CARBOplatin (PARAPLATIN) 300 mg in sodium chloride 0.9 % 250 mL chemo infusion, 300 mg (100 % of original dose 300 mg), Intravenous,  Once, 1 of 1 cycle Dose modification:   (original dose 300 mg, Cycle 1) Administration: 300 mg (06/06/2018), 300 mg (06/12/2018), 300 mg (06/19/2018), 280 mg (06/26/2018) PACLitaxel (TAXOL) 132 mg in sodium chloride 0.9 % 250 mL chemo infusion (</= 80mg /m2), 50 mg/m2 = 132 mg, Intravenous,  Once, 1 of 1 cycle Administration: 132 mg (06/06/2018), 132 mg (06/12/2018), 132 mg (06/19/2018), 132 mg (06/26/2018)  for chemotherapy treatment.      CURRENT THERAPY  Weeklycarboplatin and Taxol, with concurrent radiationstarting on 06/06/2018  INTERVAL HISTORY: Philip [name] is a 73 y.o. male who is here for follow-up. Today, he is here alone and is doing well. He is experiencing pain when swallowing and tried Sulcrafate but it did not improve his pain. He rates the pain severity a 7/10 and is constant but worsens when he eats. He takes tramadol for his back pain  and it mildly helps him with his swallowing pain. He has lost 10 lbs in 2 weeks and has a low appetite due to the swallowing pain. Denies fever, chills or any other pain and is tolerating chemotherapy well.    Pertinent positives and negatives of review of systems are  listed and detailed within the above HPI.  REVIEW OF SYSTEMS:   Constitutional: Denies fevers, chills, (+) weight loss, (+) low appetite  Eyes: Denies blurriness of vision Ears, nose, mouth, throat, and face: Denies mucositis or sore throat, (+) Odynophagia Respiratory: Denies cough, dyspnea or wheezes Cardiovascular: Denies palpitation, chest discomfort or lower extremity swelling Gastrointestinal:  Denies nausea, heartburn or change in bowel habits Skin: Denies abnormal skin rashes Lymphatics: Denies new lymphadenopathy or easy bruising Neurological:Denies numbness, tingling or new weaknesses Behavioral/Psych: Mood is stable, no new changes  All other systems were reviewed with the patient and are negative.  MEDICAL HISTORY:  Past Medical History:  Diagnosis Date  . Abdominal hernia   . Anemia   . Asthma   . Barrett's esophagus   . Cancer (HCC)    Esophagel cancer  . Cataract    Bil surgery  . Cough    over 2 weeks  . Fluid retention in legs   . GERD (gastroesophageal reflux disease)   . H/O hiatal hernia   . Hypertension   . Myasthenia gravis ([place])   . Sleep apnea    don't use c-pap at home/ tried for several years  . SOB (shortness of breath)     SURGICAL HISTORY: Past Surgical History:  Procedure Laterality Date  . BALLOON DILATION N/A 10/19/2016   Procedure: BALLOON DILATION;  Surgeon: Ladene Artist, MD;  Location: [facility];  Service: Endoscopy;  Laterality: N/A;  . COLONOSCOPY W/ POLYPECTOMY    . ESOPHAGOGASTRODUODENOSCOPY N/A 10/19/2016   Procedure: ESOPHAGOGASTRODUODENOSCOPY (EGD);  Surgeon: Ladene Artist, MD;  Location: [facility];  Service: Endoscopy;  Laterality: N/A;  . ESOPHAGOGASTRODUODENOSCOPY (EGD) WITH PROPOFOL N/A 02/12/2018   Procedure: ESOPHAGOGASTRODUODENOSCOPY (EGD) WITH PROPOFOL;  Surgeon: [name], MD;  Location: [facility];  Service: Gastroenterology;  Laterality: N/A;  . EUS N/A 02/12/2018   Procedure: UPPER  ENDOSCOPIC ULTRASOUND (EUS) RADIAL;  Surgeon: [name], MD;  Location: [facility];  Service: Gastroenterology;  Laterality: N/A;  . EYE SURGERY Bilateral    cataract  . LUMBAR LAMINECTOMY     1980's    I have reviewed the social history and family history with the patient and they are unchanged from previous note.  ALLERGIES:  has No Known Allergies.  MEDICATIONS:  Current Outpatient Medications  Medication Sig Dispense Refill  . ALBUTEROL IN Inhale 2 puffs into the lungs every 4 (four) hours as needed (shortness of breath or wheezing).     [name] amitriptyline (ELAVIL) 25 MG tablet Take 25 mg by mouth daily.     [name] aspirin EC 81 MG tablet Take 81 mg by mouth daily.    [name] atorvastatin (LIPITOR) 10 MG tablet Take 10 mg by mouth at bedtime.     . diclofenac (VOLTAREN) 75 MG EC tablet Take 75 mg by mouth daily as needed.     . Esomeprazole Magnesium 20 MG TBEC Take 40 mg by mouth daily at 12 noon.     [name] lisinopril (PRINIVIL,ZESTRIL) 20 MG tablet Take 20 mg by mouth daily.    . magic mouthwash w/lidocaine SOLN Take 5 mLs by mouth every 4 (four) hours as needed for mouth pain. [place]  mL 1  . Multiple Vitamin (MULTIVITAMIN) tablet Take 1 tablet by mouth daily.    . mycophenolate (CELLCEPT) 500 MG tablet Take 2 tablets (1,000 mg total) by mouth 2 (two) times daily. 120 tablet 12  . ondansetron (ZOFRAN) 8 MG tablet Take 1 tablet (8 mg total) by mouth 2 (two) times daily as needed for refractory nausea / vomiting. Start on day 3 after chemo. 30 tablet 1  . prochlorperazine (COMPAZINE) 10 MG tablet Take 1 tablet (10 mg total) by mouth every 6 (six) hours as needed (Nausea or vomiting). 30 tablet 1  . pyridostigmine (MESTINON) 60 MG tablet Take 0.5-1 tablets (30-60 mg total) by mouth 3 (three) times daily. 90 tablet 12  . sucralfate (CARAFATE) 1 g tablet Take 1 tablet (1 g total) by mouth 4 (four) times daily -  with meals and at bedtime. 60 tablet 1  . traMADol (ULTRAM) 50 MG tablet Take 50  mg by mouth every 6 (six) hours as needed for pain.     No current facility-administered medications for this visit.    Facility-Administered Medications Ordered in Other Visits  Medication Dose Route Frequency Provider Last Rate Last Dose  . CARBOplatin (PARAPLATIN) 300 mg in sodium chloride 0.9 % 250 mL chemo infusion  300 mg Intravenous Once Truitt Merle, MD      . PACLitaxel (TAXOL) 132 mg in sodium chloride 0.9 % 250 mL chemo infusion (</= 80mg /m2)  50 mg/m2 (Treatment Plan Recorded) Intravenous Once Truitt Merle, MD        PHYSICAL EXAMINATION: ECOG PERFORMANCE STATUS: 1 - Symptomatic but completely ambulatory  Vitals:   07/03/18 1218  BP: 119/71  Pulse: 91  Resp: 17  Temp: (!) 97.5 F (36.4 C)  SpO2: 100%   Filed Weights   07/03/18 1218  Weight: 283 lb (128.4 kg)    GENERAL:alert, no distress and comfortable SKIN: skin color, texture, turgor are normal, no rashes or significant lesions EYES: normal, Conjunctiva are pink and non-injected, sclera clear OROPHARYNX:no exudate, no erythema and lips, buccal mucosa, and tongue normal  NECK: supple, thyroid normal size, non-tender, without nodularity LYMPH:  no palpable lymphadenopathy in the cervical, axillary or inguinal LUNGS: clear to auscultation and percussion with normal breathing effort HEART: regular rate & rhythm and no murmurs and no lower extremity edema ABDOMEN:abdomen soft, non-tender and normal bowel sounds Musculoskeletal:no cyanosis of digits and no clubbing  NEURO: alert & oriented x 3 with fluent speech, no focal motor/sensory deficits  LABORATORY DATA:  I have reviewed the data as listed CBC Latest Ref Rng & Units 07/03/2018 06/26/2018 06/19/2018  WBC 4.0 - 10.5 K/uL 4.1 3.5(L) 4.3  Hemoglobin 13.0 - 17.0 g/dL 12.2(L) 12.8(L) 13.1  Hematocrit 39.0 - 52.0 % 35.2(L) 36.9(L) 38.7(L)  Platelets 150 - 400 K/uL 141(L) 197 223     CMP Latest Ref Rng & Units 07/03/2018 06/28/2018 06/26/2018  Glucose 70 - 99 mg/dL 96 -  105(H)  BUN 8 - 23 mg/dL 14 - 39(H)  Creatinine 0.61 - 1.24 mg/dL 0.83 - 1.12  Sodium 135 - 145 mmol/L 132(L) - 131(L)  Potassium 3.5 - 5.1 mmol/L 4.6 - 4.5  Chloride 98 - 111 mmol/L 104 - 101  CO2 22 - 32 mmol/L 21(L) - 20(L)  Calcium 8.9 - 10.3 mg/dL 11.5(H) 10.8(H) 11.2(H)  Total Protein 6.5 - 8.1 g/dL 6.0(L) - 6.5  Total Bilirubin 0.3 - 1.2 mg/dL 1.1 - 1.1  Alkaline Phos 38 - 126 U/L 68 - 72  AST 15 -  41 U/L 23 - 28  ALT 0 - 44 U/L 26 - 29      RADIOGRAPHIC STUDIES: I have personally reviewed the radiological images as listed and agreed with the findings in the report. No results found.   ASSESSMENT & PLAN:  OTHELL JAIME is a 73 y.o. male with history of  1. 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 - Due to his age and comorbidities, he is not a good candidate for surgery.  And the decision was made to try chemo and radiation first. -He is currently on week 5 chemo and radiation, tolerating overall moderate well - His odynophagia is worsening and Sulcralfate did not improve his pain -He has a pain specialist for his back pain, currently on tramadol, I encouraged him to call his pain specialist to see if they can prescribe a few weeks of hydrocodone for his odynophagia -Lab reviewed,he is adequate to continue weekly carbo and taxol  -F/u 1 week for last dose chemo  -Plan to repeat scan and possible endoscopy 3 months after he completes chemo and radiation   2. Odynophagia - He has trouble when swallowing and sulcrafate did not improve his pain - He will contact is back pain doctor to see if they can prescribe a short course of hydrocodone   2.Chronic backpain -Takes tramadol as needed - F/u with pain specialist  3. HTN -continuelisinopril -F/u with PCP  4. Obesity    5. Social issues -He lives alone in [place] -He  is able to take care of himself at home  6.Myasthenia gravis -cellcept 1000mg  BID for longer term immunosuppression --His myasthenia gravis is stable. No double vision. - He is at high risk for infections due to chemo  7. Hypercalcemia, primary hyperparathyroidism - He was given 1L of normal saline on 06/26/18 and 06/28/18 -His labs showed significantly elevated PTH, supporting primary hyperparathyroidism -After he completes chemoradiation for his esophageal cancer, he will follow-up with his primary care physician -I recommend low-dose calcium and vitamin D supplement, to prevent worsening PTH and bone disease, before surgical evaluation    Plan -Labs reviewed, he willcontinueradiation and chemotherapy weekly carbo and Taxol  -lab and f/u next week for last cycle chemo    No problem-specific Assessment & Plan notes found for this encounter.   No orders of the defined types were placed in this encounter.  All questions were answered. The patient knows to call the clinic with any problems, questions or concerns. No barriers to learning was detected. I spent 20 minutes counseling the patient face to face. The total time spent in the appointment was 25 minutes and more than 50% was on counseling and review of test results  I, Manson Allan am acting as scribe for Dr. Truitt Merle.  I have reviewed the above documentation for accuracy and completeness, and I agree with the above.     Truitt Merle, MD 07/03/2018

## 2018-06-28 NOTE — Progress Notes (Signed)
[facility]pt[facility] Management Clinic Progress Note   Philip [name] [id number] April 14, 1946 73 y.o.  Philip [name] is managed by Dr. Truitt Merle  Actively treated with chemotherapy/immunotherapy/hormonal therapy: yes  Current therapy: carboplatin and paclitaxel  Last treated:   06/26/2018 (cycle 1, day 22)  Next scheduled appointment with provider: 07/03/2018  Assessment: Plan:    Hypercalcemia - Plan: 0.9 %  sodium chloride infusion, PTH, intact and calcium, zolendronic acid (ZOMETA) 4 mg in sodium chloride 0.9 % 100 mL IVPB, DISCONTINUED: zolendronic acid (ZOMETA) 4 mg in sodium chloride 0.9 % 100 mL IVPB  Malignant neoplasm of gastroesophageal junction (HCC)   Hypercalcemia: Philip [name] was seen in clinic today prior to chemotherapy.  His labs returned showing an calcium level of 11.2.  He was given 1 L of normal saline IV today.  He will return to clinic on 06/28/2018 for an additional 1 L of IV fluids.  A parathyroid hormone will be collected on his return.  Zometa has been requested and will be dosed on 07/03/2018 if indicated.  Stage IIIb (cT2, cN0, cM0, G3) malignant neoplasm of the gastroesophageal junction: Philip [name] continues to be managed by Dr. Truitt Merle and is seen prior to receiving cycle 1, day 22 of carboplatin and paclitaxel.  He continues to receive radiation therapy under the direction of Dr. Kyung Rudd.  He will continue radiation therapy and will follow-up with Dr. Truitt Merle on 07/03/2018.  Please see After Visit Summary for patient specific instructions.  Future Appointments  Date Time Provider [place]  06/29/2018  2:15 PM [facility] LINAC 1 [facility] None  07/02/2018  2:15 PM [facility] LINAC 1 [facility] None  07/03/2018 11:15 AM [facility] LINAC 1 [facility] None  07/03/2018 11:45 AM [facility] LAB 1 [facility] None  07/03/2018 12:15 PM Truitt Merle, MD [facility] None  07/03/2018  1:00 PM [facility] INFUSION [facility] None  07/04/2018  2:15 PM [facility]  LINAC 1 [facility] None  07/05/2018  1:00 PM Grace Isaac, MD [facility]  07/05/2018  2:15 PM [facility] LINAC 1 [facility] None  07/06/2018  2:00 PM [facility] LINAC 1 [facility] None  07/09/2018  2:00 PM [facility] LINAC 1 [facility] None  07/10/2018 11:00 AM [facility] LINAC 1 [facility] None  07/10/2018 11:30 AM [facility] LAB 1 [facility] None  07/10/2018 12:00 PM Truitt Merle, MD [facility] None  07/10/2018 12:30 PM [facility] INFUSION [facility] None  07/11/2018  2:00 PM [facility] LINAC 1 [facility] None  07/12/2018  2:00 PM [facility] LINAC 1 [facility] None  07/13/2018  2:00 PM [facility] LINAC 1 [facility] None  12/10/2018  4:00 PM Penumalli, Earlean Polka, MD [facility] None    Orders Placed This Encounter  Procedures  . PTH, intact and calcium       Subjective:   Patient ID:  Philip [name] is a 73 y.o. (DOB Jan 24, 1946) male.  Chief Complaint: No chief complaint on file.   HPI Philip [name]  is a 73 year old male with a history of a stage IIIb (cT2, cN0, cM0, G3) malignant neoplasm of the gastroesophageal junction.  He is managed by Dr. Truitt Merle and is status post cycle 1, day 22 of carboplatin and paclitaxel which was dosed on 06/26/2018.  He continues to receive radiation therapy under the direction of Dr. Kyung Rudd.  He was seen prior to chemotherapy today.  He has lost 16 pounds in the last 2 weeks.  He reports that he has been drinking increased quantities of milk as it soothes his throat.  He  is not taking any calcium supplements.  He denies fevers, chills, sweats, nausea, vomiting, constipation, or diarrhea.  Medications: I have reviewed the patient's current medications.  Allergies: No Known Allergies  Past Medical History:  Diagnosis Date  . Abdominal hernia   . Anemia   . Asthma   . Barrett's esophagus   . Cancer (HCC)    Esophagel cancer  . Cataract    Bil surgery  . Cough    over 2 weeks  . Fluid retention in legs   . GERD  (gastroesophageal reflux disease)   . H/O hiatal hernia   . Hypertension   . Myasthenia gravis ([place])   . Sleep apnea    don't use c-pap at home/ tried for several years  . SOB (shortness of breath)     Past Surgical History:  Procedure Laterality Date  . BALLOON DILATION N/A 10/19/2016   Procedure: BALLOON DILATION;  Surgeon: Ladene Artist, MD;  Location: [facility];  Service: Endoscopy;  Laterality: N/A;  . COLONOSCOPY W/ POLYPECTOMY    . ESOPHAGOGASTRODUODENOSCOPY N/A 10/19/2016   Procedure: ESOPHAGOGASTRODUODENOSCOPY (EGD);  Surgeon: Ladene Artist, MD;  Location: [facility];  Service: Endoscopy;  Laterality: N/A;  . ESOPHAGOGASTRODUODENOSCOPY (EGD) WITH PROPOFOL N/A 02/12/2018   Procedure: ESOPHAGOGASTRODUODENOSCOPY (EGD) WITH PROPOFOL;  Surgeon: [name], MD;  Location: [facility];  Service: Gastroenterology;  Laterality: N/A;  . EUS N/A 02/12/2018   Procedure: UPPER ENDOSCOPIC ULTRASOUND (EUS) RADIAL;  Surgeon: [name], MD;  Location: [facility];  Service: Gastroenterology;  Laterality: N/A;  . EYE SURGERY Bilateral    cataract  . LUMBAR LAMINECTOMY     1980's    Family History  Problem Relation Age of Onset  . Heart disease Mother   . Lung cancer Father   . Colon cancer Neg Hx     Social History   Socioeconomic History  . Marital status: Single    Spouse name: Not on file  . Number of children: 0  . Years of education: 68  . Highest education level: Not on file  Occupational History    Comment: retired  Scientific laboratory technician  . Financial resource strain: Not on file  . Food insecurity:    Worry: Not on file    Inability: Not on file  . Transportation needs:    Medical: Yes    Non-medical: Yes  Tobacco Use  . Smoking status: Former Smoker    Packs/day: 1.00    Years: 7.00    Pack years: 7.00    Last attempt to quit: 12/21/1976    Years since quitting: 41.5  . Smokeless tobacco: Never Used  Substance and Sexual Activity    . Alcohol use: Not Currently    Comment: ocassional  . Drug use: No  . Sexual activity: Not on file  Lifestyle  . Physical activity:    Days per week: Not on file    Minutes per session: Not on file  . Stress: Not on file  Relationships  . Social connections:    Talks on phone: Not on file    Gets together: Not on file    Attends religious service: Not on file    Active member of club or organization: Not on file    Attends meetings of clubs or organizations: Not on file    Relationship status: Not on file  . Intimate partner violence:    Fear of current or ex partner: Not on file    Emotionally abused: Not  on file    Physically abused: Not on file    Forced sexual activity: Not on file  Other Topics Concern  . Not on file  Social History Narrative   Lives alone   Caffeine- coffee, tea, sodas 1/2 gallon total daily    Past Medical History, Surgical history, Social history, and Family history were reviewed and updated as appropriate.   Please see review of systems for further details on the patient's review from today.   Review of Systems:  Review of Systems  Constitutional: Positive for appetite change and unexpected weight change. Negative for activity change, chills, diaphoresis, fatigue and fever.  HENT: Positive for trouble swallowing.   Respiratory: Negative for cough, choking and shortness of breath.   Cardiovascular: Negative for chest pain.  Gastrointestinal: Negative for nausea and vomiting.  Neurological: Negative for headaches.    Objective:   Physical Exam:  BP 101/62 (BP Location: Left Arm, Patient Position: Sitting)   Pulse 94   Temp 98 F (36.7 C) (Oral)   Resp 20   Ht 6' (1.829 m)   Wt 287 lb 4.8 oz (130.3 kg)   SpO2 98%   BMI 38.96 kg/m  ECOG: 1  Physical Exam Constitutional:      General: He is not in acute distress.    Appearance: Normal appearance. He is obese. He is not ill-appearing.     Comments: The patient is an elderly male who  appears to be in no acute distress.  HENT:     Head: Normocephalic and atraumatic.     Mouth/Throat:     Mouth: Mucous membranes are moist.     Pharynx: No oropharyngeal exudate or posterior oropharyngeal erythema.     Comments: The patient has upper dentures in place.  Multiple lower teeth are missing.  The remaining teeth are in good repair. Eyes:     General: No scleral icterus.       Right eye: No discharge.        Left eye: No discharge.  Cardiovascular:     Rate and Rhythm: Normal rate and regular rhythm.     Heart sounds: No murmur. No friction rub. No gallop.   Pulmonary:     Effort: Pulmonary effort is normal. No respiratory distress.     Breath sounds: Normal breath sounds. No wheezing or rales.  Abdominal:     General: Bowel sounds are normal.     Comments: Patient's abdominal exam was limited by his body habitus.  Neurological:     Mental Status: He is alert.     Coordination: Coordination normal.     Gait: Gait normal.     Lab Review:     Component Value Date/Time   NA 131 (L) 06/26/2018 1016   NA 138 12/04/2017 1715   K 4.5 06/26/2018 1016   CL 101 06/26/2018 1016   CO2 20 (L) 06/26/2018 1016   GLUCOSE 105 (H) 06/26/2018 1016   BUN 39 (H) 06/26/2018 1016   BUN 19 12/04/2017 1715   CREATININE 1.12 06/26/2018 1016   CALCIUM 11.2 (H) 06/26/2018 1016   PROT 6.5 06/26/2018 1016   PROT 6.0 12/04/2017 1715   ALBUMIN 3.7 06/26/2018 1016   ALBUMIN 4.0 12/04/2017 1715   AST 28 06/26/2018 1016   ALT 29 06/26/2018 1016   ALKPHOS 72 06/26/2018 1016   BILITOT 1.1 06/26/2018 1016   GFRNONAA >60 06/26/2018 1016   GFRAA >60 06/26/2018 1016       Component Value Date/Time  WBC 3.5 (L) 06/26/2018 1016   WBC 7.4 04/03/2017 2040   RBC 4.07 (L) 06/26/2018 1016   HGB 12.8 (L) 06/26/2018 1016   HGB 12.9 (L) 12/04/2017 1715   HCT 36.9 (L) 06/26/2018 1016   HCT 38.0 12/04/2017 1715   PLT 197 06/26/2018 1016   PLT 217 12/04/2017 1715   MCV 90.7 06/26/2018 1016    MCV 90 12/04/2017 1715   MCH 31.4 06/26/2018 1016   MCHC 34.7 06/26/2018 1016   RDW 14.6 06/26/2018 1016   RDW 14.3 12/04/2017 1715   LYMPHSABS 0.4 (L) 06/26/2018 1016   LYMPHSABS 1.5 12/04/2017 1715   MONOABS 0.6 06/26/2018 1016   EOSABS 0.0 06/26/2018 1016   EOSABS 0.1 12/04/2017 1715   BASOSABS 0.1 06/26/2018 1016   BASOSABS 0.0 12/04/2017 1715   -------------------------------  Imaging from last 24 hours (if applicable):  Radiology interpretation: No results found.      This case was discussed with Dr. Burr Medico. She expressed agreement with my management of this patient.

## 2018-06-28 NOTE — Patient Instructions (Signed)

## 2018-06-29 ENCOUNTER — Other Ambulatory Visit: Payer: Self-pay | Admitting: Radiation Oncology

## 2018-06-29 ENCOUNTER — Ambulatory Visit
Admission: RE | Admit: 2018-06-29 | Discharge: 2018-06-29 | Disposition: A | Discharge status: Home / Self Care | Payer: Medicare Other | Source: Ambulatory Visit | Attending: Radiation Oncology | Admitting: Radiation Oncology

## 2018-06-29 DIAGNOSIS — Z510 Encounter for antineoplastic radiation therapy: Secondary | ICD-10-CM | POA: Diagnosis not present

## 2018-06-29 LAB — PTH, INTACT AND CALCIUM
Calcium, Total (PTH): 10.8 mg/dL — ABNORMAL HIGH (ref 8.6–10.2)
PTH: 110 pg/mL — AB (ref 15–65)

## 2018-06-29 MED ORDER — MAGIC MOUTHWASH W/LIDOCAINE: 5.0000 mL | ORAL | 1 refills | Status: DC | PRN

## 2018-07-02 ENCOUNTER — Ambulatory Visit
Admission: RE | Admit: 2018-07-02 | Discharge: 2018-07-02 | Disposition: A | Discharge status: Home / Self Care | Payer: Medicare Other | Source: Ambulatory Visit | Attending: Radiation Oncology | Admitting: Radiation Oncology

## 2018-07-02 DIAGNOSIS — Z510 Encounter for antineoplastic radiation therapy: Secondary | ICD-10-CM | POA: Diagnosis present

## 2018-07-02 DIAGNOSIS — C160 Malignant neoplasm of cardia: Secondary | ICD-10-CM | POA: Diagnosis not present

## 2018-07-03 ENCOUNTER — Inpatient Hospital Stay: Payer: Medicare Other

## 2018-07-03 ENCOUNTER — Encounter: Payer: Self-pay | Admitting: Hematology

## 2018-07-03 ENCOUNTER — Inpatient Hospital Stay (HOSPITAL_BASED_OUTPATIENT_CLINIC_OR_DEPARTMENT_OTHER): Payer: Medicare Other | Admitting: Hematology

## 2018-07-03 ENCOUNTER — Inpatient Hospital Stay: Payer: Medicare Other | Attending: Nurse Practitioner

## 2018-07-03 ENCOUNTER — Ambulatory Visit
Admission: RE | Admit: 2018-07-03 | Discharge: 2018-07-03 | Disposition: A | Discharge status: Home / Self Care | Payer: Medicare Other | Source: Ambulatory Visit | Attending: Radiation Oncology | Admitting: Radiation Oncology

## 2018-07-03 VITALS — BP 119/71 | HR 91 | Temp 97.5°F | Resp 17 | Ht 72.0 in | Wt 283.0 lb

## 2018-07-03 DIAGNOSIS — I10 Essential (primary) hypertension: Secondary | ICD-10-CM

## 2018-07-03 DIAGNOSIS — E210 Primary hyperparathyroidism: Secondary | ICD-10-CM

## 2018-07-03 DIAGNOSIS — E669 Obesity, unspecified: Secondary | ICD-10-CM

## 2018-07-03 DIAGNOSIS — D61810 Antineoplastic chemotherapy induced pancytopenia: Secondary | ICD-10-CM | POA: Diagnosis not present

## 2018-07-03 DIAGNOSIS — C160 Malignant neoplasm of cardia: Secondary | ICD-10-CM | POA: Diagnosis present

## 2018-07-03 DIAGNOSIS — Z7982 Long term (current) use of aspirin: Secondary | ICD-10-CM | POA: Insufficient documentation

## 2018-07-03 DIAGNOSIS — G7000 Myasthenia gravis without (acute) exacerbation: Secondary | ICD-10-CM | POA: Insufficient documentation

## 2018-07-03 DIAGNOSIS — R1310 Dysphagia, unspecified: Secondary | ICD-10-CM

## 2018-07-03 DIAGNOSIS — T451X5A Adverse effect of antineoplastic and immunosuppressive drugs, initial encounter: Secondary | ICD-10-CM | POA: Insufficient documentation

## 2018-07-03 DIAGNOSIS — Z9221 Personal history of antineoplastic chemotherapy: Secondary | ICD-10-CM | POA: Insufficient documentation

## 2018-07-03 DIAGNOSIS — Z923 Personal history of irradiation: Secondary | ICD-10-CM | POA: Diagnosis not present

## 2018-07-03 DIAGNOSIS — Z79899 Other long term (current) drug therapy: Secondary | ICD-10-CM

## 2018-07-03 DIAGNOSIS — Z5111 Encounter for antineoplastic chemotherapy: Secondary | ICD-10-CM | POA: Diagnosis not present

## 2018-07-03 DIAGNOSIS — Z510 Encounter for antineoplastic radiation therapy: Secondary | ICD-10-CM | POA: Diagnosis not present

## 2018-07-03 LAB — CMP (CANCER CENTER ONLY)
ALT: 26 U/L (ref 0–44)
AST: 23 U/L (ref 15–41)
Albumin: 3.7 g/dL (ref 3.5–5.0)
Alkaline Phosphatase: 68 U/L (ref 38–126)
Anion gap: 7 (ref 5–15)
BUN: 14 mg/dL (ref 8–23)
CO2: 21 mmol/L — ABNORMAL LOW (ref 22–32)
Calcium: 11.5 mg/dL — ABNORMAL HIGH (ref 8.9–10.3)
Chloride: 104 mmol/L (ref 98–111)
Creatinine: 0.83 mg/dL (ref 0.61–1.24)
GFR, Estimated: >60 mL/min (ref >60)
Glucose, Bld: 96 mg/dL (ref 70–99)
Potassium: 4.6 mmol/L (ref 3.5–5.1)
Sodium: 132 mmol/L — ABNORMAL LOW (ref 135–145)
Total Bilirubin: 1.1 mg/dL (ref 0.3–1.2)
Total Protein: 6 g/dL — ABNORMAL LOW (ref 6.5–8.1)

## 2018-07-03 LAB — CBC WITH DIFFERENTIAL (CANCER CENTER ONLY)
ABS IMMATURE GRANULOCYTES: 0.03 10*3/uL (ref 0.00–0.07)
Basophils Absolute: 0.1 10*3/uL (ref 0.0–0.1)
Basophils Relative: 2 %
Eosinophils Absolute: 0.2 10*3/uL (ref 0.0–0.5)
Eosinophils Relative: 4 %
HCT: 35.2 % — ABNORMAL LOW (ref 39.0–52.0)
Hemoglobin: 12.2 g/dL — ABNORMAL LOW (ref 13.0–17.0)
Immature Granulocytes: 1 %
Lymphocytes Relative: 8 %
Lymphs Abs: 0.3 10*3/uL — ABNORMAL LOW (ref 0.7–4.0)
MCH: 32 pg (ref 26.0–34.0)
MCHC: 34.7 g/dL (ref 30.0–36.0)
MCV: 92.4 fL (ref 80.0–100.0)
Monocytes Absolute: 0.6 10*3/uL (ref 0.1–1.0)
Monocytes Relative: 13 %
NEUTROS ABS: 2.9 10*3/uL (ref 1.7–7.7)
Neutrophils Relative %: 72 %
Platelet Count: 141 10*3/uL — ABNORMAL LOW (ref 150–400)
RBC: 3.81 MIL/uL — ABNORMAL LOW (ref 4.22–5.81)
RDW: 14.6 % (ref 11.5–15.5)
WBC Count: 4.1 10*3/uL (ref 4.0–10.5)
nRBC: 0 % (ref 0.0–0.2)

## 2018-07-03 LAB — CEA (IN HOUSE-CHCC): CEA (CHCC-In House): 4.58 ng/mL (ref 0.00–5.00)

## 2018-07-03 MED ORDER — PALONOSETRON HCL INJECTION 0.25 MG/5ML
0.2500 mg | Freq: Once | INTRAVENOUS | Status: AC
  Administered 2018-07-03: 0.25 mg via INTRAVENOUS

## 2018-07-03 MED ORDER — DIPHENHYDRAMINE HCL 50 MG/ML IJ SOLN
50.0000 mg | Freq: Once | INTRAMUSCULAR | Status: AC
  Administered 2018-07-03: 50 mg via INTRAVENOUS

## 2018-07-03 MED ORDER — DEXAMETHASONE SODIUM PHOSPHATE 10 MG/ML IJ SOLN
10.0000 mg | Freq: Once | INTRAMUSCULAR | Status: AC
  Administered 2018-07-03: 10 mg via INTRAVENOUS

## 2018-07-03 MED ORDER — SODIUM CHLORIDE 0.9 % IV SOLN
50.0000 mg/m2 | Freq: Once | INTRAVENOUS | Status: AC
  Administered 2018-07-03: 132 mg via INTRAVENOUS
  Filled 2018-07-03: qty 22

## 2018-07-03 MED ORDER — SODIUM CHLORIDE 0.9 % IV SOLN
300.0000 mg | Freq: Once | INTRAVENOUS | Status: AC
  Administered 2018-07-03: 300 mg via INTRAVENOUS
  Filled 2018-07-03: qty 30

## 2018-07-03 MED ORDER — SODIUM CHLORIDE 0.9 % IV SOLN
20.0000 mg | Freq: Once | INTRAVENOUS | Status: AC
  Administered 2018-07-03: 20 mg via INTRAVENOUS
  Filled 2018-07-03: qty 2

## 2018-07-03 MED ORDER — DIPHENHYDRAMINE HCL 50 MG/ML IJ SOLN
INTRAMUSCULAR | Status: AC
  Filled 2018-07-03: qty 1

## 2018-07-03 MED ORDER — PALONOSETRON HCL INJECTION 0.25 MG/5ML
INTRAVENOUS | Status: AC
  Filled 2018-07-03: qty 5

## 2018-07-03 MED ORDER — FAMOTIDINE IN NACL 20-0.9 MG/50ML-% IV SOLN: 20.0000 mg | Freq: Once | INTRAVENOUS | Status: DC

## 2018-07-03 MED ORDER — SODIUM CHLORIDE 0.9 % IV SOLN
Freq: Once | INTRAVENOUS | Status: AC
  Administered 2018-07-03: 13:00:00 via INTRAVENOUS
  Filled 2018-07-03: qty 250

## 2018-07-03 MED ORDER — ZOLEDRONIC ACID 4 MG/100ML IV SOLN
4.0000 mg | Freq: Once | INTRAVENOUS | Status: DC
  Filled 2018-07-03: qty 100

## 2018-07-03 MED ORDER — DEXAMETHASONE SODIUM PHOSPHATE 10 MG/ML IJ SOLN
INTRAMUSCULAR | Status: AC
  Filled 2018-07-03: qty 1

## 2018-07-03 NOTE — Patient Instructions (Signed)
   [facility] Discharge Instructions for Patients Receiving Chemotherapy  Today you received the following chemotherapy agents Taxol and Carboplatin   To help prevent nausea and vomiting after your treatment, we encourage you to take your nausea medication as directed.    If you develop nausea and vomiting that is not controlled by your nausea medication, call the clinic.   BELOW ARE SYMPTOMS THAT SHOULD BE REPORTED IMMEDIATELY:  *FEVER GREATER THAN 100.5 F  *CHILLS WITH OR WITHOUT FEVER  NAUSEA AND VOMITING THAT IS NOT CONTROLLED WITH YOUR NAUSEA MEDICATION  *UNUSUAL SHORTNESS OF BREATH  *UNUSUAL BRUISING OR BLEEDING  TENDERNESS IN MOUTH AND THROAT WITH OR WITHOUT PRESENCE OF ULCERS  *URINARY PROBLEMS  *BOWEL PROBLEMS  UNUSUAL RASH Items with * indicate a potential emergency and should be followed up as soon as possible.  Feel free to call the clinic should you have any questions or concerns. The clinic phone number is [phone].  Please show the CHEMO ALERT CARD at check-in to the Emergency Department and triage nurse.   

## 2018-07-03 NOTE — Progress Notes (Signed)
[place]   [phone] [phone]   Clinic Follow up Note   Patient Care Team: [name], MD as PCP - General (Family Medicine) Mansouraty, [name], MD as Consulting Physician (Gastroenterology) 07/10/2018  CHIEF COMPLAINT: F/u GEJ cancer, on chemoRT  SUMMARY OF ONCOLOGIC HISTORY: Oncology History   Cancer Staging Malignant neoplasm of gastroesophageal junction ([place]) Staging form: Esophagus - Adenocarcinoma, AJCC 8th Edition - Clinical stage from 02/26/2018: Stage IIB (cT2, cN0, cM0, G3) - Signed by [name], NP on 02/26/2018      Malignant neoplasm of gastroesophageal junction ([place])   01/22/2018 Initial Biopsy    Diagnosis Surgical [P], GE junction nodule BX - POORLY 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    01/22/2018 Procedure    COLONOSCOPY IMPRESSION - Redundant colon. - The examination was otherwise normal on direct and retroflexion views. - No specimens collected.  UPPER ENDOSCOPY IMPRESSION: - Esophageal mucosal changes secondary to established short-segment Barrett's disease. - Mucosal nodule found in the esophagus. Biopsied. - Erythematous mucosa in the prepyloric region of the stomach. - Submucosal nodule found in the duodenum.     02/01/2018 Initial Diagnosis    Malignant neoplasm of gastroesophageal junction ([place])    02/05/2018 Imaging    CT CAP IMPRESSION: 1. No esophageal primary identified. No findings of metastatic disease in the chest, abdomen, or pelvis. 2.  Aortic Atherosclerosis (ICD10-I70.0).    02/12/2018 Procedure    EUS Impression: - A mass was found in the distal esophagus into the gastroesophageal junction and extending to proximal cardia. A tissue diagnosis was obtained prior to this exam consistent with adenocarcinoma. This was staged at T2N0Mx, because of a loss of interface of the muscularis propria as the distal GE Junction  becomes the cardia and the mass moves into this region, but there are many more regions and images that suggest a T1 lesion into the submucosa.    05/29/2018 Imaging    NM PET Image Initial (PI) Skull Base To Thigh  IMPRESSION: 1. Hypermetabolic focus at the GE junction consistent with know adenocarcinoma. 2. No evidence of metastatic adenopathy in the mediastinum or upper abdomen. 3. No evidence of liver metastasis.  No pulmonary metastasis.    06/06/2018 -  Chemotherapy    The patient had palonosetron (ALOXI) injection 0.25 mg, 0.25 mg, Intravenous,  Once, 1 of 1 cycle Administration: 0.25 mg (06/06/2018), 0.25 mg (06/12/2018), 0.25 mg (06/19/2018), 0.25 mg (06/26/2018), 0.25 mg (07/03/2018), 0.25 mg (07/10/2018) CARBOplatin (PARAPLATIN) 300 mg in sodium chloride 0.9 % 250 mL chemo infusion, 300 mg (100 % of original dose 300 mg), Intravenous,  Once, 1 of 1 cycle Dose modification:   (original dose 300 mg, Cycle 1) Administration: 300 mg (06/06/2018), 300 mg (06/12/2018), 300 mg (06/19/2018), 280 mg (06/26/2018), 300 mg (07/03/2018) PACLitaxel (TAXOL) 132 mg in sodium chloride 0.9 % 250 mL chemo infusion (</= 80mg /m2), 50 mg/m2 = 132 mg, Intravenous,  Once, 1 of 1 cycle Administration: 132 mg (06/06/2018), 132 mg (06/12/2018), 132 mg (06/19/2018), 132 mg (06/26/2018), 132 mg (07/03/2018), 132 mg (07/10/2018)  for chemotherapy treatment.      CURRENT THERAPY  Weeklycarboplatin and Taxol, with concurrent radiationstarting on 06/06/2018  INTERVAL HISTORY: Philip [name] is a 73 y.o. male who is here for follow-up, and last week of chemo and radiation.  He is tolerating treatment moderately well, still has moderate odynophagia and dysphagia.  I called in liquid hydrocodone last week, he takes  a few times a day, but did not feel much help.  He still able to keep liquids down, and drinks adequately.  He has moderate fatigue, still lives independently without difficulties. He lost additional 8 lbs since last week.    Pertinent positives and negatives of review of systems are listed and detailed within the above HPI.  REVIEW OF SYSTEMS:   Constitutional: Denies fevers, chills, (+) weight loss, (+) fatigue.  Eyes: Denies blurriness of vision Ears, nose, mouth, throat, and face: Denies mucositis or sore throat Respiratory: Denies cough, dyspnea or wheezes Cardiovascular: Denies palpitation, chest discomfort or lower extremity swelling Gastrointestinal:  Denies nausea, heartburn or change in bowel habits Skin: Denies abnormal skin rashes Lymphatics: Denies new lymphadenopathy or easy bruising Neurological:Denies numbness, tingling or new weaknesses Behavioral/Psych: Mood is stable, no new changes  All other systems were reviewed with the patient and are negative.  MEDICAL HISTORY:  Past Medical History:  Diagnosis Date  . Abdominal hernia   . Anemia   . Asthma   . Barrett's esophagus   . Cancer (HCC)    Esophagel cancer  . Cataract    Bil surgery  . Cough    over 2 weeks  . Fluid retention in legs   . GERD (gastroesophageal reflux disease)   . H/O hiatal hernia   . Hypertension   . Myasthenia gravis ([place])   . Sleep apnea    don't use c-pap at home/ tried for several years  . SOB (shortness of breath)     SURGICAL HISTORY: Past Surgical History:  Procedure Laterality Date  . BALLOON DILATION N/A 10/19/2016   Procedure: BALLOON DILATION;  Surgeon: Ladene Artist, MD;  Location: [facility];  Service: Endoscopy;  Laterality: N/A;  . COLONOSCOPY W/ POLYPECTOMY    . ESOPHAGOGASTRODUODENOSCOPY N/A 10/19/2016   Procedure: ESOPHAGOGASTRODUODENOSCOPY (EGD);  Surgeon: Ladene Artist, MD;  Location: [facility];  Service: Endoscopy;  Laterality: N/A;  . ESOPHAGOGASTRODUODENOSCOPY (EGD) WITH PROPOFOL N/A 02/12/2018   Procedure: ESOPHAGOGASTRODUODENOSCOPY (EGD) WITH PROPOFOL;  Surgeon: [name], MD;  Location: [place];  Service: Gastroenterology;  Laterality: N/A;  . EUS  N/A 02/12/2018   Procedure: UPPER ENDOSCOPIC ULTRASOUND (EUS) RADIAL;  Surgeon: [name], MD;  Location: [place];  Service: Gastroenterology;  Laterality: N/A;  . EYE SURGERY Bilateral    cataract  . LUMBAR LAMINECTOMY     1980's    I have reviewed the social history and family history with the patient and they are unchanged from previous note.  ALLERGIES:  has No Known Allergies.  MEDICATIONS:  Current Outpatient Medications  Medication Sig Dispense Refill  . ALBUTEROL IN Inhale 2 puffs into the lungs every 4 (four) hours as needed (shortness of breath or wheezing).     [name] amitriptyline (ELAVIL) 25 MG tablet Take 25 mg by mouth daily.     [name] aspirin EC 81 MG tablet Take 81 mg by mouth daily.    [name] atorvastatin (LIPITOR) 10 MG tablet Take 10 mg by mouth at bedtime.     . diclofenac (VOLTAREN) 75 MG EC tablet Take 75 mg by mouth daily as needed.     . Esomeprazole Magnesium 20 MG TBEC Take 40 mg by mouth daily at 12 noon.     [name] HYDROcodone-acetaminophen (HYCET) 7.5-325 mg/15 ml solution Take 10 mLs by mouth every 6 (six) hours as needed for moderate pain. 120 mL 0  . lisinopril (PRINIVIL,ZESTRIL) 20 MG tablet Take 20 mg by mouth daily.    [name]  magic mouthwash w/lidocaine SOLN Take 5 mLs by mouth every 4 (four) hours as needed for mouth pain. 500 mL 1  . Multiple Vitamin (MULTIVITAMIN) tablet Take 1 tablet by mouth daily.    . mycophenolate (CELLCEPT) 500 MG tablet Take 2 tablets (1,000 mg total) by mouth 2 (two) times daily. 120 tablet 12  . ondansetron (ZOFRAN) 8 MG tablet Take 1 tablet (8 mg total) by mouth 2 (two) times daily as needed for refractory nausea / vomiting. Start on day 3 after chemo. 30 tablet 1  . prochlorperazine (COMPAZINE) 10 MG tablet Take 1 tablet (10 mg total) by mouth every 6 (six) hours as needed (Nausea or vomiting). 30 tablet 1  . pyridostigmine (MESTINON) 60 MG tablet Take 0.5-1 tablets (30-60 mg total) by mouth 3 (three) times daily. 90 tablet 12    . sucralfate (CARAFATE) 1 g tablet Take 1 tablet (1 g total) by mouth 4 (four) times daily -  with meals and at bedtime. 60 tablet 1  . traMADol (ULTRAM) 50 MG tablet Take 50 mg by mouth every 6 (six) hours as needed for pain.     No current facility-administered medications for this visit.     PHYSICAL EXAMINATION: ECOG PERFORMANCE STATUS: 1 - Symptomatic but completely ambulatory  Vitals:   07/10/18 1151  BP: 96/60  Pulse: 89  Resp: 18  Temp: 97.8 F (36.6 C)  SpO2: 99%   Filed Weights   07/10/18 1151  Weight: 275 lb 11.2 oz (125.1 kg)    GENERAL:alert, no distress and comfortable. Obese  SKIN: skin color, texture, turgor are normal, no rashes or significant lesions EYES: normal, Conjunctiva are pink and non-injected, sclera clear OROPHARYNX:no exudate, no erythema and lips, buccal mucosa, and tongue normal  NECK: supple, thyroid normal size, non-tender, without nodularity LYMPH:  no palpable lymphadenopathy in the cervical, axillary or inguinal LUNGS: clear to auscultation and percussion with normal breathing effort HEART: regular rate & rhythm and no murmurs and no lower extremity edema ABDOMEN:abdomen soft, non-tender and normal bowel sounds Musculoskeletal:no cyanosis of digits and no clubbing  NEURO: alert & oriented x 3 with fluent speech, no focal motor/sensory deficits  LABORATORY DATA:  I have reviewed the data as listed CBC Latest Ref Rng & Units 07/10/2018 07/03/2018 06/26/2018  WBC 4.0 - 10.5 K/uL 2.2(L) 4.1 3.5(L)  Hemoglobin 13.0 - 17.0 g/dL 10.6(L) 12.2(L) 12.8(L)  Hematocrit 39.0 - 52.0 % 30.4(L) 35.2(L) 36.9(L)  Platelets 150 - 400 K/uL 97(L) 141(L) 197     CMP Latest Ref Rng & Units 07/10/2018 07/03/2018 06/28/2018  Glucose 70 - 99 mg/dL 89 96 -  BUN 8 - 23 mg/dL 27(H) 14 -  Creatinine 0.61 - 1.24 mg/dL 1.09 0.83 -  Sodium 135 - 145 mmol/L 132(L) 132(L) -  Potassium 3.5 - 5.1 mmol/L 4.5 4.6 -  Chloride 98 - 111 mmol/L 103 104 -  CO2 22 - 32 mmol/L  20(L) 21(L) -  Calcium 8.9 - 10.3 mg/dL 11.3(H) 11.5(H) 10.8(H)  Total Protein 6.5 - 8.1 g/dL 5.9(L) 6.0(L) -  Total Bilirubin 0.3 - 1.2 mg/dL 1.3(H) 1.1 -  Alkaline Phos 38 - 126 U/L 70 68 -  AST 15 - 41 U/L 25 23 -  ALT 0 - 44 U/L 26 26 -      RADIOGRAPHIC STUDIES: I have personally reviewed the radiological images as listed and agreed with the findings in the report. No results found.   ASSESSMENT & PLAN:  Philip [name] is a 73  y.o. male with history of  1. 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 - Due to his age and comorbidities, he is not a good candidate for surgery. The decision was made to try chemo and radiation first. -He is currently on last week chemo and radiation, tolerating overall moderate well - His odynophagia is worsening and Sulcralfate did not improve his pain. I called liquid hydrocodone, not very helpful, I recommend him to increase dose from 10 cc to 15 cc every 6 hours -Lab reviewed, he has developed pancytopenia.  Due to his significant weight loss, and cytopenias, I will hold carboplatin today, and proceed with last dose Taxol today -He will continue daily radiation, plan to complete in next Monday. -f/u in 2 weeks     2. Odynophagia - He has difficulty swallowing and sulcrafate has not improve his pain -He is liquid hydrocodone, I recommend him to increase dose for better pain control  3. Weight loss and malnutrition -He has lost a significant weight since he started chemoradiation, secondary to dysphagia and odynophagia -I encouraged him to increase oral nutrition supplement, especially boost -He is able to tolerate liquid and soft diet well, will increase hydrocodone for odynophagia  4.Chronic backpain -Takes tramadol as needed - F/uwith pain specialist  5. HTN -continuelisinopril -F/u with  PCP   6. Social issues -He lives alone in [place] -He is able to take care of himself at home  7.Myasthenia gravis - cellcept 1000mg  BID for longer term immunosuppression - Stable - He is at high risk for infections due to chemo  8. Hypercalcemia, primary hyperparathyroidism -I previously  recommend low-dose calcium and vitamin D supplement, he will take multivitamins which contains low dose calcium  -His labs showed significantly elevated PTH, supporting primary hyperparathyroidism -After he completes chemoradiation for his esophageal cancer, he will follow-up with his primary care physician   Plan -Labs reviewed, due to pancytopenia, will hold carboplatin today, and proceed her last cycle Taxol today  -He will continue daily radiation until next Monday -Follow-up in 2 weeks -He will increase hydrocodone for odynophagia    No problem-specific Assessment & Plan notes found for this encounter.   No orders of the defined types were placed in this encounter.  All questions were answered. The patient knows to call the clinic with any problems, questions or concerns. No barriers to learning was detected. I spent 20 minutes counseling the patient face to face. The total time spent in the appointment was 25 minutes and more than 50% was on counseling and review of test results  I, Manson Allan am acting as scribe for Dr. Truitt Merle.  I have reviewed the above documentation for accuracy and completeness, and I agree with the above.     Truitt Merle, MD 07/10/2018

## 2018-07-03 NOTE — Progress Notes (Signed)
Pt Calcium 11.5 today.  Per Dr. Burr Medico, do not give pt Zometa today and to instruct the pt to drink more water and she will continue to monitor his calcium levels.  Pt educated and states understanding.

## 2018-07-04 ENCOUNTER — Telehealth: Payer: Self-pay | Admitting: Hematology

## 2018-07-04 ENCOUNTER — Telehealth: Payer: Self-pay | Admitting: *Deleted

## 2018-07-04 ENCOUNTER — Ambulatory Visit
Admission: RE | Admit: 2018-07-04 | Discharge: 2018-07-04 | Disposition: A | Discharge status: Home / Self Care | Payer: Medicare Other | Source: Ambulatory Visit | Attending: Radiation Oncology | Admitting: Radiation Oncology

## 2018-07-04 DIAGNOSIS — Z510 Encounter for antineoplastic radiation therapy: Secondary | ICD-10-CM | POA: Diagnosis not present

## 2018-07-04 NOTE — Telephone Encounter (Signed)
Received call from [facility] stating that pt had said that Dr Burr Medico had suggested liquid hydrocodone while pt is on radiation.  They are OK with this & will hold on prescribing on their end for now.   Also received call from pt stating that He had discussed with the scoliosis cent & they are OK with her suggestion yesterday. Message to Dr Burr Medico.

## 2018-07-04 NOTE — Telephone Encounter (Signed)
No los per 3/3. 

## 2018-07-05 ENCOUNTER — Other Ambulatory Visit: Payer: Self-pay | Admitting: Hematology

## 2018-07-05 ENCOUNTER — Ambulatory Visit
Admission: RE | Admit: 2018-07-05 | Discharge: 2018-07-05 | Disposition: A | Discharge status: Home / Self Care | Payer: Medicare Other | Source: Ambulatory Visit | Attending: Radiation Oncology | Admitting: Radiation Oncology

## 2018-07-05 ENCOUNTER — Encounter: Payer: Medicare Other | Admitting: Cardiothoracic Surgery

## 2018-07-05 DIAGNOSIS — Z510 Encounter for antineoplastic radiation therapy: Secondary | ICD-10-CM | POA: Diagnosis not present

## 2018-07-05 MED ORDER — HYDROCODONE-ACETAMINOPHEN 7.5-325 MG/15ML PO SOLN: 10.0000 mL | Freq: Four times a day (QID) | ORAL | 0 refills | Status: DC | PRN

## 2018-07-06 ENCOUNTER — Ambulatory Visit: Payer: Medicare Other

## 2018-07-09 ENCOUNTER — Ambulatory Visit
Admission: RE | Admit: 2018-07-09 | Discharge: 2018-07-09 | Disposition: A | Discharge status: Home / Self Care | Payer: Medicare Other | Source: Ambulatory Visit | Attending: Radiation Oncology | Admitting: Radiation Oncology

## 2018-07-09 DIAGNOSIS — Z510 Encounter for antineoplastic radiation therapy: Secondary | ICD-10-CM | POA: Diagnosis not present

## 2018-07-10 ENCOUNTER — Inpatient Hospital Stay: Payer: Medicare Other

## 2018-07-10 ENCOUNTER — Encounter: Payer: Self-pay | Admitting: Hematology

## 2018-07-10 ENCOUNTER — Telehealth: Payer: Self-pay | Admitting: Hematology

## 2018-07-10 ENCOUNTER — Ambulatory Visit
Admission: RE | Admit: 2018-07-10 | Discharge: 2018-07-10 | Disposition: A | Discharge status: Home / Self Care | Payer: Medicare Other | Source: Ambulatory Visit | Attending: Radiation Oncology | Admitting: Radiation Oncology

## 2018-07-10 ENCOUNTER — Inpatient Hospital Stay (HOSPITAL_BASED_OUTPATIENT_CLINIC_OR_DEPARTMENT_OTHER): Payer: Medicare Other | Admitting: Hematology

## 2018-07-10 ENCOUNTER — Other Ambulatory Visit: Payer: Self-pay

## 2018-07-10 VITALS — BP 96/60 | HR 89 | Temp 97.8°F | Resp 18 | Ht 72.0 in | Wt 275.7 lb

## 2018-07-10 DIAGNOSIS — I10 Essential (primary) hypertension: Secondary | ICD-10-CM

## 2018-07-10 DIAGNOSIS — G7000 Myasthenia gravis without (acute) exacerbation: Secondary | ICD-10-CM

## 2018-07-10 DIAGNOSIS — E210 Primary hyperparathyroidism: Secondary | ICD-10-CM

## 2018-07-10 DIAGNOSIS — Z5111 Encounter for antineoplastic chemotherapy: Secondary | ICD-10-CM | POA: Diagnosis not present

## 2018-07-10 DIAGNOSIS — Z9221 Personal history of antineoplastic chemotherapy: Secondary | ICD-10-CM | POA: Diagnosis not present

## 2018-07-10 DIAGNOSIS — E669 Obesity, unspecified: Secondary | ICD-10-CM

## 2018-07-10 DIAGNOSIS — R1310 Dysphagia, unspecified: Secondary | ICD-10-CM | POA: Diagnosis not present

## 2018-07-10 DIAGNOSIS — C160 Malignant neoplasm of cardia: Secondary | ICD-10-CM | POA: Diagnosis not present

## 2018-07-10 DIAGNOSIS — Z923 Personal history of irradiation: Secondary | ICD-10-CM

## 2018-07-10 DIAGNOSIS — D61810 Antineoplastic chemotherapy induced pancytopenia: Secondary | ICD-10-CM | POA: Diagnosis not present

## 2018-07-10 DIAGNOSIS — Z7982 Long term (current) use of aspirin: Secondary | ICD-10-CM

## 2018-07-10 DIAGNOSIS — Z510 Encounter for antineoplastic radiation therapy: Secondary | ICD-10-CM | POA: Diagnosis not present

## 2018-07-10 DIAGNOSIS — Z79899 Other long term (current) drug therapy: Secondary | ICD-10-CM

## 2018-07-10 LAB — CEA (IN HOUSE-CHCC): CEA (CHCC-In House): 3.56 ng/mL (ref 0.00–5.00)

## 2018-07-10 LAB — CMP (CANCER CENTER ONLY)
ALK PHOS: 70 U/L (ref 38–126)
ALT: 26 U/L (ref 0–44)
ANION GAP: 9 (ref 5–15)
AST: 25 U/L (ref 15–41)
Albumin: 3.6 g/dL (ref 3.5–5.0)
BILIRUBIN TOTAL: 1.3 mg/dL — AB (ref 0.3–1.2)
BUN: 27 mg/dL — ABNORMAL HIGH (ref 8–23)
CALCIUM: 11.3 mg/dL — AB (ref 8.9–10.3)
CO2: 20 mmol/L — ABNORMAL LOW (ref 22–32)
Chloride: 103 mmol/L (ref 98–111)
Creatinine: 1.09 mg/dL (ref 0.61–1.24)
GFR, Est AFR Am: >60 mL/min (ref >60)
GFR, Estimated: >60 mL/min (ref >60)
Glucose, Bld: 89 mg/dL (ref 70–99)
Potassium: 4.5 mmol/L (ref 3.5–5.1)
Sodium: 132 mmol/L — ABNORMAL LOW (ref 135–145)
Total Protein: 5.9 g/dL — ABNORMAL LOW (ref 6.5–8.1)

## 2018-07-10 LAB — CBC WITH DIFFERENTIAL (CANCER CENTER ONLY)
Abs Immature Granulocytes: 0.01 10*3/uL (ref 0.00–0.07)
Basophils Absolute: 0 10*3/uL (ref 0.0–0.1)
Basophils Relative: 1 %
EOS ABS: 0 10*3/uL (ref 0.0–0.5)
Eosinophils Relative: 1 %
HCT: 30.4 % — ABNORMAL LOW (ref 39.0–52.0)
Hemoglobin: 10.6 g/dL — ABNORMAL LOW (ref 13.0–17.0)
Immature Granulocytes: 0 %
Lymphocytes Relative: 11 %
Lymphs Abs: 0.3 10*3/uL — ABNORMAL LOW (ref 0.7–4.0)
MCH: 31.8 pg (ref 26.0–34.0)
MCHC: 34.9 g/dL (ref 30.0–36.0)
MCV: 91.3 fL (ref 80.0–100.0)
Monocytes Absolute: 0.3 10*3/uL (ref 0.1–1.0)
Monocytes Relative: 13 %
Neutro Abs: 1.6 10*3/uL — ABNORMAL LOW (ref 1.7–7.7)
Neutrophils Relative %: 74 %
Platelet Count: 97 10*3/uL — ABNORMAL LOW (ref 150–400)
RBC: 3.33 MIL/uL — ABNORMAL LOW (ref 4.22–5.81)
RDW: 15 % (ref 11.5–15.5)
WBC Count: 2.2 10*3/uL — ABNORMAL LOW (ref 4.0–10.5)
nRBC: 0 % (ref 0.0–0.2)

## 2018-07-10 MED ORDER — PALONOSETRON HCL INJECTION 0.25 MG/5ML
INTRAVENOUS | Status: AC
  Filled 2018-07-10: qty 5

## 2018-07-10 MED ORDER — SODIUM CHLORIDE 0.9 % IV SOLN
20.0000 mg | Freq: Once | INTRAVENOUS | Status: AC
  Administered 2018-07-10: 20 mg via INTRAVENOUS
  Filled 2018-07-10: qty 2

## 2018-07-10 MED ORDER — SODIUM CHLORIDE 0.9 % IV SOLN
50.0000 mg/m2 | Freq: Once | INTRAVENOUS | Status: AC
  Administered 2018-07-10: 132 mg via INTRAVENOUS
  Filled 2018-07-10: qty 22

## 2018-07-10 MED ORDER — DIPHENHYDRAMINE HCL 50 MG/ML IJ SOLN
INTRAMUSCULAR | Status: AC
  Filled 2018-07-10: qty 1

## 2018-07-10 MED ORDER — FAMOTIDINE IN NACL 20-0.9 MG/50ML-% IV SOLN: 20.0000 mg | Freq: Once | INTRAVENOUS | Status: DC

## 2018-07-10 MED ORDER — DEXAMETHASONE SODIUM PHOSPHATE 10 MG/ML IJ SOLN
INTRAMUSCULAR | Status: AC
  Filled 2018-07-10: qty 1

## 2018-07-10 MED ORDER — SODIUM CHLORIDE 0.9 % IV SOLN
Freq: Once | INTRAVENOUS | Status: AC
  Administered 2018-07-10: 13:00:00 via INTRAVENOUS
  Filled 2018-07-10: qty 250

## 2018-07-10 MED ORDER — PALONOSETRON HCL INJECTION 0.25 MG/5ML
0.2500 mg | Freq: Once | INTRAVENOUS | Status: AC
  Administered 2018-07-10: 0.25 mg via INTRAVENOUS

## 2018-07-10 MED ORDER — DIPHENHYDRAMINE HCL 50 MG/ML IJ SOLN
50.0000 mg | Freq: Once | INTRAMUSCULAR | Status: AC
  Administered 2018-07-10: 50 mg via INTRAVENOUS

## 2018-07-10 MED ORDER — DEXAMETHASONE SODIUM PHOSPHATE 10 MG/ML IJ SOLN
10.0000 mg | Freq: Once | INTRAMUSCULAR | Status: AC
  Administered 2018-07-10: 10 mg via INTRAVENOUS

## 2018-07-10 NOTE — Progress Notes (Signed)
Per Dr. Burr Medico, okay to treat with plt of 97

## 2018-07-10 NOTE — Telephone Encounter (Signed)
Called and scheduled appt per 3/10 los.  Spoke with patient aunt, she is aware of appt date and time.

## 2018-07-10 NOTE — Patient Instructions (Signed)
[facility] Discharge Instructions for Patients Receiving Chemotherapy  Today you received the following chemotherapy agents:  Taxol.  To help prevent nausea and vomiting after your treatment, we encourage you to take your nausea medication as directed.   If you develop nausea and vomiting that is not controlled by your nausea medication, call the clinic.   BELOW ARE SYMPTOMS THAT SHOULD BE REPORTED IMMEDIATELY:  *FEVER GREATER THAN 100.5 F  *CHILLS WITH OR WITHOUT FEVER  NAUSEA AND VOMITING THAT IS NOT CONTROLLED WITH YOUR NAUSEA MEDICATION  *UNUSUAL SHORTNESS OF BREATH  *UNUSUAL BRUISING OR BLEEDING  TENDERNESS IN MOUTH AND THROAT WITH OR WITHOUT PRESENCE OF ULCERS  *URINARY PROBLEMS  *BOWEL PROBLEMS  UNUSUAL RASH Items with * indicate a potential emergency and should be followed up as soon as possible.  Feel free to call the clinic should you have any questions or concerns. The clinic phone number is [phone].  Please show the CHEMO ALERT CARD at check-in to the Emergency Department and triage nurse.   

## 2018-07-11 ENCOUNTER — Ambulatory Visit: Payer: Medicare Other

## 2018-07-11 ENCOUNTER — Ambulatory Visit
Admission: RE | Admit: 2018-07-11 | Discharge: 2018-07-11 | Disposition: A | Discharge status: Home / Self Care | Payer: Medicare Other | Source: Ambulatory Visit | Attending: Radiation Oncology | Admitting: Radiation Oncology

## 2018-07-11 ENCOUNTER — Other Ambulatory Visit: Payer: Self-pay

## 2018-07-11 DIAGNOSIS — Z510 Encounter for antineoplastic radiation therapy: Secondary | ICD-10-CM | POA: Diagnosis not present

## 2018-07-12 ENCOUNTER — Ambulatory Visit
Admission: RE | Admit: 2018-07-12 | Discharge: 2018-07-12 | Disposition: A | Discharge status: Home / Self Care | Payer: Medicare Other | Source: Ambulatory Visit | Attending: Radiation Oncology | Admitting: Radiation Oncology

## 2018-07-12 ENCOUNTER — Other Ambulatory Visit: Payer: Self-pay

## 2018-07-12 ENCOUNTER — Ambulatory Visit: Payer: Medicare Other

## 2018-07-12 DIAGNOSIS — Z510 Encounter for antineoplastic radiation therapy: Secondary | ICD-10-CM | POA: Diagnosis not present

## 2018-07-13 ENCOUNTER — Other Ambulatory Visit: Payer: Self-pay

## 2018-07-13 ENCOUNTER — Ambulatory Visit: Payer: Medicare Other

## 2018-07-13 ENCOUNTER — Ambulatory Visit
Admission: RE | Admit: 2018-07-13 | Discharge: 2018-07-13 | Disposition: A | Discharge status: Home / Self Care | Payer: Medicare Other | Source: Ambulatory Visit | Attending: Radiation Oncology | Admitting: Radiation Oncology

## 2018-07-13 DIAGNOSIS — Z510 Encounter for antineoplastic radiation therapy: Secondary | ICD-10-CM | POA: Diagnosis not present

## 2018-07-16 ENCOUNTER — Encounter: Payer: Self-pay | Admitting: Radiation Oncology

## 2018-07-16 ENCOUNTER — Other Ambulatory Visit: Payer: Self-pay

## 2018-07-16 ENCOUNTER — Ambulatory Visit
Admission: RE | Admit: 2018-07-16 | Discharge: 2018-07-16 | Disposition: A | Discharge status: Home / Self Care | Payer: Medicare Other | Source: Ambulatory Visit | Attending: Radiation Oncology | Admitting: Radiation Oncology

## 2018-07-16 DIAGNOSIS — Z510 Encounter for antineoplastic radiation therapy: Secondary | ICD-10-CM | POA: Diagnosis not present

## 2018-07-17 ENCOUNTER — Other Ambulatory Visit: Payer: Self-pay | Admitting: Hematology

## 2018-07-20 NOTE — Progress Notes (Signed)
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    01/22/2018 Procedure    COLONOSCOPY IMPRESSION - Redundant colon. - The examination was otherwise normal on direct and retroflexion views. - No specimens collected.  UPPER ENDOSCOPY IMPRESSION: - Esophageal mucosal changes secondary to established short-segment Barrett's disease. - Mucosal nodule found in the esophagus. Biopsied. - Erythematous mucosa in the prepyloric region of the stomach. - Submucosal nodule found in the duodenum.     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 No esophageal primary identified. No findings of metastatic disease in the chest, abdomen, or pelvis. 2.  Aortic Atherosclerosis (ICD10-I70.0).    02/12/2018 Procedure    EUS Impression: - A mass was found in the distal esophagus into the gastroesophageal junction and extending to proximal cardia. A tissue diagnosis was obtained prior to this exam consistent with adenocarcinoma. This was staged at T2N0Mx, because of a loss of interface of the muscularis propria as the  distal GE Junction becomes the cardia and the mass moves into this region, but there are many more regions and images that suggest a T1 lesion into the submucosa.    05/29/2018 Imaging    NM PET Image Initial (PI) Skull Base To Thigh  IMPRESSION: 1. Hypermetabolic focus at the GE junction consistent with know adenocarcinoma. 2. No evidence of metastatic adenopathy in the mediastinum or upper abdomen. 3. No evidence of liver metastasis.  No pulmonary metastasis.    06/06/2018 - 07/10/2018 Chemotherapy    Weeklycarboplatin and Taxol, with concurrent radiationstarting on 06/06/2018-07/10/18    06/06/2018 - 07/16/2018 Radiation Therapy    Concurrent chemoRT with Dr. Harvel Ricks on 06/06/2018-07/16/18      INTERVAL HISTORY:  Philip [name] is here for a follow up post concurrent chemoRT. He presents to the clinic today by himself. He is in a wheelchair although her notes he can walk. He notes this last week he has been struggling more.   He took lisinopril this morning. He notes he has been trying to eat more but has pain in his throat, Odynophagia. He denies any progressive cough. He has been drinking about 1 gallon a day for the past 2 days. He denies any fever at home. He notes dizziness upon standing. He notes concern with waiting in the ER at [facility].   REVIEW OF SYSTEMS:   Constitutional: Denies fevers, chills or abnormal weight loss Eyes: Denies blurriness of vision Ears, nose, mouth, throat, and face: Denies mucositis or sore throat (+) Odynophagia  Respiratory: Denies cough or wheezes (+) mild Dyspnea  Cardiovascular: Denies palpitation, chest discomfort or lower extremity swelling Gastrointestinal:  Denies nausea, heartburn or change in  bowel habits Skin: Denies abnormal skin rashes Lymphatics: Denies new lymphadenopathy or easy bruising Neurological:Denies numbness, tingling or new weaknesses Behavioral/Psych: Mood is stable, no new changes  All other systems were reviewed with the  patient and are negative.  MEDICAL HISTORY:  Past Medical History:  Diagnosis Date  . Abdominal hernia   . Anemia   . Asthma   . Barrett's esophagus   . Cancer (HCC)    Esophagel cancer  . Cataract    Bil surgery  . Cough    over 2 weeks  . Fluid retention in legs   . GERD (gastroesophageal reflux disease)   . H/O hiatal hernia   . Hypertension   . Myasthenia gravis ([place])   . Sleep apnea    don't use c-pap at home/ tried for several years  . SOB (shortness of breath)     SURGICAL HISTORY: Past Surgical History:  Procedure Laterality Date  . BALLOON DILATION N/A 10/19/2016   Procedure: BALLOON DILATION;  Surgeon: Ladene Artist, MD;  Location: [facility];  Service: Endoscopy;  Laterality: N/A;  . COLONOSCOPY W/ POLYPECTOMY    . ESOPHAGOGASTRODUODENOSCOPY N/A 10/19/2016   Procedure: ESOPHAGOGASTRODUODENOSCOPY (EGD);  Surgeon: Ladene Artist, MD;  Location: [facility];  Service: Endoscopy;  Laterality: N/A;  . ESOPHAGOGASTRODUODENOSCOPY (EGD) WITH PROPOFOL N/A 02/12/2018   Procedure: ESOPHAGOGASTRODUODENOSCOPY (EGD) WITH PROPOFOL;  Surgeon: [name], MD;  Location: [facility];  Service: Gastroenterology;  Laterality: N/A;  . EUS N/A 02/12/2018   Procedure: UPPER ENDOSCOPIC ULTRASOUND (EUS) RADIAL;  Surgeon: [name], MD;  Location: [facility];  Service: Gastroenterology;  Laterality: N/A;  . EYE SURGERY Bilateral    cataract  . LUMBAR LAMINECTOMY     1980's    I have reviewed the social history and family history with the patient and they are unchanged from previous note.  ALLERGIES:  has No Known Allergies.  MEDICATIONS:  Current Outpatient Medications  Medication Sig Dispense Refill  . ALBUTEROL IN Inhale 2 puffs into the lungs every 4 (four) hours as needed (shortness of breath or wheezing).     [name] amitriptyline (ELAVIL) 25 MG tablet Take 25 mg by mouth daily.     [name] aspirin EC 81 MG tablet Take 81 mg by mouth daily.    [name]  atorvastatin (LIPITOR) 10 MG tablet Take 10 mg by mouth at bedtime.     . diclofenac (VOLTAREN) 75 MG EC tablet Take 75 mg by mouth daily as needed.     . Esomeprazole Magnesium 20 MG TBEC Take 40 mg by mouth daily at 12 noon.     [name] HYDROcodone-acetaminophen (HYCET) 7.5-325 mg/15 ml solution Take 10 mLs by mouth every 6 (six) hours as needed for moderate pain. 120 mL 0  . lisinopril (PRINIVIL,ZESTRIL) 20 MG tablet Take 20 mg by mouth daily.    . magic mouthwash w/lidocaine SOLN Take 5 mLs by mouth every 4 (four) hours as needed for mouth pain. 500 mL 1  . Multiple Vitamin (MULTIVITAMIN) tablet Take 1 tablet by mouth daily.    . mycophenolate (CELLCEPT) 500 MG tablet Take 2 tablets (1,000 mg total) by mouth 2 (two) times daily. 120 tablet 12  . ondansetron (ZOFRAN) 8 MG tablet Take 1 tablet (8 mg total) by mouth 2 (two) times daily as needed for refractory nausea / vomiting. Start on day 3 after chemo. 30 tablet 1  . prochlorperazine (COMPAZINE) 10 MG tablet Take 1 tablet (10 mg total) by mouth every 6 (six)  hours as needed (Nausea or vomiting). 30 tablet 1  . pyridostigmine (MESTINON) 60 MG tablet Take 0.5-1 tablets (30-60 mg total) by mouth 3 (three) times daily. 90 tablet 12  . sucralfate (CARAFATE) 1 g tablet TAKE ONE TABLET BY MOUTH FOUR TIMES A DAY WITH MEALS AND AT BEDTIME AS DIRECTED 60 tablet 0  . traMADol (ULTRAM) 50 MG tablet Take 50 mg by mouth every 6 (six) hours as needed for pain.     No current facility-administered medications for this visit.     PHYSICAL EXAMINATION: ECOG PERFORMANCE STATUS: 3 - Symptomatic, >50% confined to bed  Vitals:   07/23/18 0927 07/23/18 0932  BP: (!) 76/48 (!) 71/40  Pulse: 96   Resp: 16   Temp: (!) 97 F (36.1 C)   SpO2: 100%    Filed Weights   07/23/18 0927  Weight: 262 lb 8 oz (119.1 kg)    GENERAL:alert, no distress and comfortable SKIN: skin color, texture, turgor are normal, no rashes or significant lesions EYES: normal, Conjunctiva  are pink and non-injected, sclera clear OROPHARYNX:no exudate, no erythema and lips, buccal mucosa, and tongue normal  NECK: supple, thyroid normal size, non-tender, without nodularity LYMPH:  no palpable lymphadenopathy in the cervical, axillary or inguinal LUNGS: clear to auscultation and percussion with normal breathing effort HEART: regular rate & rhythm and no murmurs and no lower extremity edema ABDOMEN:abdomen soft, non-tender and normal bowel sounds Musculoskeletal:no cyanosis of digits and no clubbing  NEURO: alert & oriented x 3 with fluent speech, no focal motor/sensory deficits  LABORATORY DATA:  I have reviewed the data as listed CBC Latest Ref Rng & Units 07/23/2018 07/10/2018 07/03/2018  WBC 4.0 - 10.5 K/uL 3.5(L) 2.2(L) 4.1  Hemoglobin 13.0 - 17.0 g/dL 10.2(L) 10.6(L) 12.2(L)  Hematocrit 39.0 - 52.0 % 29.6(L) 30.4(L) 35.2(L)  Platelets 150 - 400 K/uL 225 97(L) 141(L)     CMP Latest Ref Rng & Units 07/23/2018 07/10/2018 07/03/2018  Glucose 70 - 99 mg/dL 107(H) 89 96  BUN 8 - 23 mg/dL 44(H) 27(H) 14  Creatinine 0.61 - 1.24 mg/dL 2.89(H) 1.09 0.83  Sodium 135 - 145 mmol/L 129(L) 132(L) 132(L)  Potassium 3.5 - 5.1 mmol/L 4.5 4.5 4.6  Chloride 98 - 111 mmol/L 99 103 104  CO2 22 - 32 mmol/L 18(L) 20(L) 21(L)  Calcium 8.9 - 10.3 mg/dL 11.5(H) 11.3(H) 11.5(H)  Total Protein 6.5 - 8.1 g/dL 6.0(L) 5.9(L) 6.0(L)  Total Bilirubin 0.3 - 1.2 mg/dL 1.2 1.3(H) 1.1  Alkaline Phos 38 - 126 U/L 81 70 68  AST 15 - 41 U/L 23 25 23   ALT 0 - 44 U/L 29 26 26       RADIOGRAPHIC STUDIES: I have personally reviewed the radiological images as listed and agreed with the findings in the report. No results found.   ASSESSMENT & PLAN:  Philip [name] is a 73 y.o. male with   1. Hypotension and AKI -Patient completed radiation a week ago, last chemo 2 weeks ago.  Was found to be hypotensive with systolic blood pressure in 70s when he was checked in to his routine visit today -His hypotension is  likely related to poor oral intake, he is afebrile, moderate neutropenia, infection also need to be ruled out, although he does not have localized infection signs.  -Labs today show BUN at 44 and Cr at 2.89, his creatinine was normal 2 weeks ago, this is likely secondary to dehydration  -Will proceed with [facility] ED for IVFs and further  work-up, likely need to be admitted to [facility] for AKI today. -I spoke with ED charge nurse   2. 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 -Due to his age and comorbidities, he is not a good candidate for surgery. He completed concurrent chemoRT with CT last week.  -Labs reviewed, WBC 3.5, Hg at 10.2, Sodium 129, BUN 44, Cr 2.89, Ca 11.5, protein 6, albumin 3.4.  -He is clinically not doing well with significant hypotension, low food and liquid intake, and weight loss. I will send him to [facility] ED today for further work up and treatment. He is agreeable.  -F/u open     3. Dysphagia and odynophagia -He has difficulty swallowing and sulcrafate has not improve his pain -He is liquid hydrocodone as needed  -Stable, he has not been eating or drinking adequately.   4. Weight loss and malnutrition -He has lost a significant weight since he started chemoradiation, secondary to dysphagia and odynophagia -I previously encouraged him to increase oral nutrition supplement, especially boost -He is able to tolerate liquid and soft diet well, will increase hydrocodone for odynophagia -He has lost more weight in the last 1.5 weeks due to low food and liquid intake.   5.Chronic backpain  -Takestramadol as needed -Continue to F/uwith pain specialist  6. HTN -Onlisinopril -F/u with PCP -BP dropped to 71/40 today (07/23/18)  -I advised him to hold Lisinopril and I will admit him to [facility] ED today    7. Social issues -He lives  alone in [place] -He is able to take care of himself at home  8.Myasthenia gravis -On cellcept 1000mg BIDfor longer term immunosuppression -Stable  -He is at high risk for infections due to chemo  9. Hypercalcemia,primary hyperparathyroidism -I previously recommend low-dose calcium and vitamin D supplement, he will take multivitamins which contains low dose calcium  -Labs supporting primary hyperparathyroidism -Ca at 11.5 today (07/23/18), stable.    Plan -Given AKI and Hypotension, will send to [facility] ED immediately, I spoke with charge nurse in ED and my nurse will send him over   -F/u in the hospital    No problem-specific Assessment & Plan notes found for this encounter.   No orders of the defined types were placed in this encounter.  All questions were answered. The patient knows to call the clinic with any problems, questions or concerns. No barriers to learning was detected. I spent 20 minutes counseling the patient face to face. The total time spent in the appointment was 25 minutes and more than 50% was on counseling and review of test results     Truitt Merle, MD 07/23/2018   I, Joslyn Devon, am acting as scribe for Truitt Merle, MD.   I have reviewed the above documentation for accuracy and completeness, and I agree with the above.

## 2018-07-23 ENCOUNTER — Other Ambulatory Visit: Payer: Self-pay

## 2018-07-23 ENCOUNTER — Inpatient Hospital Stay (HOSPITAL_BASED_OUTPATIENT_CLINIC_OR_DEPARTMENT_OTHER): Payer: Medicare Other | Admitting: Hematology

## 2018-07-23 ENCOUNTER — Encounter: Payer: Self-pay | Admitting: Hematology

## 2018-07-23 ENCOUNTER — Encounter (HOSPITAL_COMMUNITY): Payer: Self-pay

## 2018-07-23 ENCOUNTER — Inpatient Hospital Stay (HOSPITAL_COMMUNITY)
Admission: EM | Admit: 2018-07-23 | Discharge: 2018-07-26 | DRG: 872 | Disposition: A | Discharge status: Home / Self Care | Payer: Medicare Other | Attending: Internal Medicine | Admitting: Internal Medicine

## 2018-07-23 ENCOUNTER — Observation Stay (HOSPITAL_COMMUNITY): Payer: Medicare Other

## 2018-07-23 ENCOUNTER — Emergency Department (HOSPITAL_COMMUNITY): Payer: Medicare Other

## 2018-07-23 ENCOUNTER — Encounter (HOSPITAL_COMMUNITY): Payer: Self-pay | Admitting: Emergency Medicine

## 2018-07-23 ENCOUNTER — Inpatient Hospital Stay: Payer: Medicare Other

## 2018-07-23 VITALS — BP 71/40 | HR 96 | Temp 97.0°F | Resp 16 | Ht 72.0 in | Wt 262.5 lb

## 2018-07-23 DIAGNOSIS — E871 Hypo-osmolality and hyponatremia: Secondary | ICD-10-CM | POA: Diagnosis present

## 2018-07-23 DIAGNOSIS — G4733 Obstructive sleep apnea (adult) (pediatric): Secondary | ICD-10-CM | POA: Diagnosis present

## 2018-07-23 DIAGNOSIS — Z79891 Long term (current) use of opiate analgesic: Secondary | ICD-10-CM

## 2018-07-23 DIAGNOSIS — T66XXXA Radiation sickness, unspecified, initial encounter: Secondary | ICD-10-CM | POA: Diagnosis present

## 2018-07-23 DIAGNOSIS — Z87891 Personal history of nicotine dependence: Secondary | ICD-10-CM

## 2018-07-23 DIAGNOSIS — R1310 Dysphagia, unspecified: Secondary | ICD-10-CM

## 2018-07-23 DIAGNOSIS — J45909 Unspecified asthma, uncomplicated: Secondary | ICD-10-CM | POA: Diagnosis present

## 2018-07-23 DIAGNOSIS — Z923 Personal history of irradiation: Secondary | ICD-10-CM

## 2018-07-23 DIAGNOSIS — A419 Sepsis, unspecified organism: Secondary | ICD-10-CM | POA: Diagnosis not present

## 2018-07-23 DIAGNOSIS — I10 Essential (primary) hypertension: Secondary | ICD-10-CM | POA: Diagnosis not present

## 2018-07-23 DIAGNOSIS — C160 Malignant neoplasm of cardia: Secondary | ICD-10-CM | POA: Diagnosis not present

## 2018-07-23 DIAGNOSIS — N390 Urinary tract infection, site not specified: Secondary | ICD-10-CM | POA: Diagnosis present

## 2018-07-23 DIAGNOSIS — G7000 Myasthenia gravis without (acute) exacerbation: Secondary | ICD-10-CM | POA: Diagnosis present

## 2018-07-23 DIAGNOSIS — I951 Orthostatic hypotension: Secondary | ICD-10-CM | POA: Diagnosis present

## 2018-07-23 DIAGNOSIS — Y842 Radiological procedure and radiotherapy as the cause of abnormal reaction of the patient, or of later complication, without mention of misadventure at the time of the procedure: Secondary | ICD-10-CM | POA: Diagnosis present

## 2018-07-23 DIAGNOSIS — Z8249 Family history of ischemic heart disease and other diseases of the circulatory system: Secondary | ICD-10-CM

## 2018-07-23 DIAGNOSIS — D61810 Antineoplastic chemotherapy induced pancytopenia: Secondary | ICD-10-CM | POA: Diagnosis not present

## 2018-07-23 DIAGNOSIS — N179 Acute kidney failure, unspecified: Secondary | ICD-10-CM | POA: Diagnosis not present

## 2018-07-23 DIAGNOSIS — Z9221 Personal history of antineoplastic chemotherapy: Secondary | ICD-10-CM

## 2018-07-23 DIAGNOSIS — Z79899 Other long term (current) drug therapy: Secondary | ICD-10-CM

## 2018-07-23 DIAGNOSIS — T451X5A Adverse effect of antineoplastic and immunosuppressive drugs, initial encounter: Secondary | ICD-10-CM | POA: Diagnosis not present

## 2018-07-23 DIAGNOSIS — Z7982 Long term (current) use of aspirin: Secondary | ICD-10-CM

## 2018-07-23 DIAGNOSIS — D649 Anemia, unspecified: Secondary | ICD-10-CM | POA: Diagnosis present

## 2018-07-23 DIAGNOSIS — R339 Retention of urine, unspecified: Secondary | ICD-10-CM

## 2018-07-23 DIAGNOSIS — M549 Dorsalgia, unspecified: Secondary | ICD-10-CM | POA: Diagnosis present

## 2018-07-23 DIAGNOSIS — K2270 Barrett's esophagus without dysplasia: Secondary | ICD-10-CM | POA: Diagnosis present

## 2018-07-23 DIAGNOSIS — I959 Hypotension, unspecified: Secondary | ICD-10-CM

## 2018-07-23 DIAGNOSIS — E860 Dehydration: Secondary | ICD-10-CM | POA: Diagnosis not present

## 2018-07-23 DIAGNOSIS — Z801 Family history of malignant neoplasm of trachea, bronchus and lung: Secondary | ICD-10-CM

## 2018-07-23 DIAGNOSIS — G8929 Other chronic pain: Secondary | ICD-10-CM | POA: Diagnosis present

## 2018-07-23 DIAGNOSIS — K219 Gastro-esophageal reflux disease without esophagitis: Secondary | ICD-10-CM | POA: Diagnosis present

## 2018-07-23 DIAGNOSIS — Z9841 Cataract extraction status, right eye: Secondary | ICD-10-CM

## 2018-07-23 DIAGNOSIS — D6481 Anemia due to antineoplastic chemotherapy: Secondary | ICD-10-CM | POA: Diagnosis present

## 2018-07-23 DIAGNOSIS — E440 Moderate protein-calorie malnutrition: Secondary | ICD-10-CM | POA: Diagnosis present

## 2018-07-23 DIAGNOSIS — Z9842 Cataract extraction status, left eye: Secondary | ICD-10-CM

## 2018-07-23 DIAGNOSIS — Z6835 Body mass index (BMI) 35.0-35.9, adult: Secondary | ICD-10-CM

## 2018-07-23 DIAGNOSIS — E861 Hypovolemia: Secondary | ICD-10-CM | POA: Diagnosis present

## 2018-07-23 LAB — CMP (CANCER CENTER ONLY)
ALT: 29 U/L (ref 0–44)
AST: 23 U/L (ref 15–41)
Albumin: 3.4 g/dL — ABNORMAL LOW (ref 3.5–5.0)
Alkaline Phosphatase: 81 U/L (ref 38–126)
Anion gap: 12 (ref 5–15)
BUN: 44 mg/dL — ABNORMAL HIGH (ref 8–23)
CO2: 18 mmol/L — ABNORMAL LOW (ref 22–32)
Calcium: 11.5 mg/dL — ABNORMAL HIGH (ref 8.9–10.3)
Chloride: 99 mmol/L (ref 98–111)
Creatinine: 2.89 mg/dL — ABNORMAL HIGH (ref 0.61–1.24)
GFR, EST NON AFRICAN AMERICAN: 21 mL/min — AB (ref >60)
GFR, Est AFR Am: 24 mL/min — ABNORMAL LOW (ref >60)
Glucose, Bld: 107 mg/dL — ABNORMAL HIGH (ref 70–99)
Potassium: 4.5 mmol/L (ref 3.5–5.1)
Sodium: 129 mmol/L — ABNORMAL LOW (ref 135–145)
TOTAL PROTEIN: 6 g/dL — AB (ref 6.5–8.1)
Total Bilirubin: 1.2 mg/dL (ref 0.3–1.2)

## 2018-07-23 LAB — CBC WITH DIFFERENTIAL (CANCER CENTER ONLY)
Abs Immature Granulocytes: 0.03 10*3/uL (ref 0.00–0.07)
Basophils Absolute: 0 10*3/uL (ref 0.0–0.1)
Basophils Relative: 1 %
Eosinophils Absolute: 0 10*3/uL (ref 0.0–0.5)
Eosinophils Relative: 1 %
HCT: 29.6 % — ABNORMAL LOW (ref 39.0–52.0)
Hemoglobin: 10.2 g/dL — ABNORMAL LOW (ref 13.0–17.0)
Immature Granulocytes: 1 %
Lymphocytes Relative: 10 %
Lymphs Abs: 0.4 10*3/uL — ABNORMAL LOW (ref 0.7–4.0)
MCH: 32.4 pg (ref 26.0–34.0)
MCHC: 34.5 g/dL (ref 30.0–36.0)
MCV: 94 fL (ref 80.0–100.0)
Monocytes Absolute: 0.9 10*3/uL (ref 0.1–1.0)
Monocytes Relative: 26 %
Neutro Abs: 2.1 10*3/uL (ref 1.7–7.7)
Neutrophils Relative %: 61 %
PLATELETS: 225 10*3/uL (ref 150–400)
RBC: 3.15 MIL/uL — AB (ref 4.22–5.81)
RDW: 18.1 % — ABNORMAL HIGH (ref 11.5–15.5)
WBC: 3.5 10*3/uL — AB (ref 4.0–10.5)
nRBC: 0 % (ref 0.0–0.2)

## 2018-07-23 LAB — URINALYSIS, ROUTINE W REFLEX MICROSCOPIC
BILIRUBIN URINE: NEGATIVE
Glucose, UA: NEGATIVE mg/dL
Ketones, ur: NEGATIVE mg/dL
Nitrite: NEGATIVE
Protein, ur: NEGATIVE mg/dL
Specific Gravity, Urine: 1.015 (ref 1.005–1.030)
pH: 5 (ref 5.0–8.0)

## 2018-07-23 LAB — SODIUM, URINE, RANDOM: Sodium, Ur: 32 mmol/L

## 2018-07-23 LAB — LACTIC ACID, PLASMA
LACTIC ACID, VENOUS: 1.9 mmol/L (ref 0.5–1.9)
Lactic Acid, Venous: 2 mmol/L (ref 0.5–1.9)

## 2018-07-23 LAB — PROTEIN / CREATININE RATIO, URINE
Creatinine, Urine: 178.9 mg/dL
Protein Creatinine Ratio: 0.16 mg/mg{Cre} — ABNORMAL HIGH (ref 0.00–0.15)
Total Protein, Urine: 28 mg/dL

## 2018-07-23 MED ORDER — PYRIDOSTIGMINE BROMIDE 60 MG PO TABS
60.0000 mg | ORAL_TABLET | Freq: Three times a day (TID) | ORAL | Status: DC
  Administered 2018-07-23 – 2018-07-26 (×8): 60 mg via ORAL
  Filled 2018-07-23 (×9): qty 1

## 2018-07-23 MED ORDER — ONDANSETRON HCL 4 MG/2ML IJ SOLN: 4.0000 mg | Freq: Four times a day (QID) | INTRAMUSCULAR | Status: DC | PRN

## 2018-07-23 MED ORDER — LACTATED RINGERS IV BOLUS (SEPSIS)
1000.0000 mL | Freq: Once | INTRAVENOUS | Status: AC
  Administered 2018-07-23: 1000 mL via INTRAVENOUS

## 2018-07-23 MED ORDER — ESOMEPRAZOLE MAGNESIUM 20 MG PO TBEC: 40.0000 mg | DELAYED_RELEASE_TABLET | Freq: Every day | ORAL | Status: DC

## 2018-07-23 MED ORDER — ACETAMINOPHEN 325 MG PO TABS
650.0000 mg | ORAL_TABLET | Freq: Four times a day (QID) | ORAL | Status: DC | PRN
  Administered 2018-07-24: 650 mg via ORAL
  Filled 2018-07-23: qty 2

## 2018-07-23 MED ORDER — ONDANSETRON HCL 4 MG PO TABS: 4.0000 mg | ORAL_TABLET | Freq: Four times a day (QID) | ORAL | Status: DC | PRN

## 2018-07-23 MED ORDER — SUCRALFATE 1 G PO TABS
1.0000 g | ORAL_TABLET | Freq: Three times a day (TID) | ORAL | Status: DC
  Administered 2018-07-23 – 2018-07-26 (×10): 1 g via ORAL
  Filled 2018-07-23 (×10): qty 1

## 2018-07-23 MED ORDER — SODIUM CHLORIDE 0.9 % IV SOLN
2.0000 g | Freq: Once | INTRAVENOUS | Status: AC
  Administered 2018-07-23: 2 g via INTRAVENOUS
  Filled 2018-07-23: qty 2

## 2018-07-23 MED ORDER — VANCOMYCIN HCL IN DEXTROSE 1-5 GM/200ML-% IV SOLN
1000.0000 mg | Freq: Once | INTRAVENOUS | Status: DC
  Filled 2018-07-23: qty 200

## 2018-07-23 MED ORDER — HEPARIN SODIUM (PORCINE) 5000 UNIT/ML IJ SOLN
5000.0000 [IU] | Freq: Three times a day (TID) | INTRAMUSCULAR | Status: DC
  Administered 2018-07-23 – 2018-07-24 (×3): 5000 [IU] via SUBCUTANEOUS
  Filled 2018-07-23 (×3): qty 1

## 2018-07-23 MED ORDER — ACETAMINOPHEN 160 MG/5ML PO SOLN
1000.0000 mg | Freq: Once | ORAL | Status: AC
  Administered 2018-07-23: 1000 mg via ORAL
  Filled 2018-07-23: qty 40.6

## 2018-07-23 MED ORDER — ACETAMINOPHEN 650 MG RE SUPP: 650.0000 mg | Freq: Four times a day (QID) | RECTAL | Status: DC | PRN

## 2018-07-23 MED ORDER — SODIUM CHLORIDE 0.9 % IV BOLUS
1000.0000 mL | Freq: Once | INTRAVENOUS | Status: AC
  Administered 2018-07-23: 1000 mL via INTRAVENOUS

## 2018-07-23 MED ORDER — ASPIRIN EC 81 MG PO TBEC
81.0000 mg | DELAYED_RELEASE_TABLET | Freq: Every day | ORAL | Status: DC
  Administered 2018-07-24 – 2018-07-26 (×3): 81 mg via ORAL
  Filled 2018-07-23 (×3): qty 1

## 2018-07-23 MED ORDER — TRAMADOL HCL 50 MG PO TABS
50.0000 mg | ORAL_TABLET | Freq: Two times a day (BID) | ORAL | Status: DC | PRN
  Administered 2018-07-23 – 2018-07-26 (×3): 50 mg via ORAL
  Filled 2018-07-23 (×3): qty 1

## 2018-07-23 MED ORDER — SODIUM CHLORIDE 0.9 % IV SOLN
1.0000 g | INTRAVENOUS | Status: DC
  Administered 2018-07-23 – 2018-07-25 (×3): 1 g via INTRAVENOUS
  Filled 2018-07-23 (×3): qty 1
  Filled 2018-07-23: qty 10

## 2018-07-23 MED ORDER — AMITRIPTYLINE HCL 25 MG PO TABS
25.0000 mg | ORAL_TABLET | Freq: Every day | ORAL | Status: DC
  Administered 2018-07-23 – 2018-07-25 (×3): 25 mg via ORAL
  Filled 2018-07-23 (×3): qty 1

## 2018-07-23 MED ORDER — MAGIC MOUTHWASH W/LIDOCAINE: 5.0000 mL | ORAL | Status: DC | PRN

## 2018-07-23 MED ORDER — PANTOPRAZOLE SODIUM 40 MG PO TBEC
40.0000 mg | DELAYED_RELEASE_TABLET | Freq: Every day | ORAL | Status: DC
  Administered 2018-07-24 – 2018-07-26 (×3): 40 mg via ORAL
  Filled 2018-07-23 (×3): qty 1

## 2018-07-23 MED ORDER — METRONIDAZOLE IN NACL 5-0.79 MG/ML-% IV SOLN
500.0000 mg | Freq: Once | INTRAVENOUS | Status: AC
  Administered 2018-07-23: 500 mg via INTRAVENOUS
  Filled 2018-07-23: qty 100

## 2018-07-23 MED ORDER — HYDROCODONE-ACETAMINOPHEN 7.5-325 MG/15ML PO SOLN
10.0000 mL | Freq: Four times a day (QID) | ORAL | Status: DC | PRN
  Administered 2018-07-24 – 2018-07-26 (×7): 10 mL via ORAL
  Filled 2018-07-23 (×7): qty 15

## 2018-07-23 MED ORDER — MYCOPHENOLATE MOFETIL 250 MG PO CAPS
1000.0000 mg | ORAL_CAPSULE | Freq: Two times a day (BID) | ORAL | Status: DC
  Administered 2018-07-23 – 2018-07-26 (×6): 1000 mg via ORAL
  Filled 2018-07-23 (×6): qty 4

## 2018-07-23 MED ORDER — VANCOMYCIN HCL 10 G IV SOLR
2000.0000 mg | Freq: Once | INTRAVENOUS | Status: AC
  Administered 2018-07-23: 2000 mg via INTRAVENOUS
  Filled 2018-07-23: qty 2000

## 2018-07-23 MED ORDER — ATORVASTATIN CALCIUM 10 MG PO TABS
10.0000 mg | ORAL_TABLET | Freq: Every day | ORAL | Status: DC
  Administered 2018-07-23 – 2018-07-25 (×3): 10 mg via ORAL
  Filled 2018-07-23 (×3): qty 1

## 2018-07-23 NOTE — ED Notes (Signed)
CRITICAL VALUE STICKER  CRITICAL VALUE: Lactic Acid 2.0  DATE & TIME NOTIFIED: 07/23/18  MD NOTIFIED: Billy Fischer, [place]: [phone]

## 2018-07-23 NOTE — ED Notes (Signed)
Unsuccessful peripheral IV. Schlossman MD attempting US guided IV.

## 2018-07-23 NOTE — ED Notes (Signed)
Dr. Grunz at bedside 

## 2018-07-23 NOTE — ED Notes (Signed)
Attempted to collect urine via in/out cath. Met resistance when advancing catheter.  Unable to collect urine at this time. MD made aware.

## 2018-07-23 NOTE — ED Notes (Signed)
Per Dr Bonner Puna, foley is to remain inserted at this time. He will be ordering ultrasound of kidneys. Made patient aware that cath will not be coming out at this time per patient's request.

## 2018-07-23 NOTE — ED Notes (Signed)
Bed: WA14 Expected date:  Expected time:  Means of arrival:  Comments: Hold forRes B 

## 2018-07-23 NOTE — ED Provider Notes (Signed)
[facility] DEPT Provider Note   CSN: [id number] Arrival date & time: 07/23/18  9528    History   Chief Complaint Chief Complaint  Patient presents with  . Hypotension  . Cancer    HPI Philip [name] is a 73 y.o. male.     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  Reports since his last radiation treatment on Monday one week ago, he has had fatigue and decreased appetite.  Reports he has had barely anything to eat over the last week.  Reports some nausea but denies vomiting.  Reports he had some diarrhea related to his treatment, however was given a medication and now reports very few episodes, 1/day.  Denies black or bloody stools.  Denies chest pain, cough, congestion.  Reports sore throat which has been ongoing and related to his radiation.  Denies any other flu symptoms.  Denies fevers, urinary symptoms, abdominal pain or headache.  Reports he is to have bilateral lower extremity edema, but does not have any at this time.  Denies leg pain or swelling.  Denies trauma or falls.  Reports some mild dyspnea.   Past Medical History:  Diagnosis Date  . Abdominal hernia   . Anemia   . Asthma   . Barrett's esophagus   . Cancer (HCC)    Esophagel cancer  . Cataract    Bil surgery  . Cough    over 2 weeks  . Fluid retention in legs   . GERD (gastroesophageal reflux disease)   . H/O hiatal hernia   . Hypertension   . Myasthenia gravis ([place])   . Sleep apnea    don't use c-pap at home/ tried for several years  . SOB (shortness of breath)     Patient Active Problem List   Diagnosis Date Noted  . Pre-operative cardiovascular examination 02/22/2018  . Shortness of breath 02/22/2018  . Malignant neoplasm of gastroesophageal junction  ([place]) 02/01/2018  . Myasthenia gravis ([place]) 01/18/2017  . OSA on CPAP 01/18/2017  . Esophageal stricture 10/27/2016  . Dysphagia   . Abnormal esophagram   . ESOPHAGEAL REFLUX 05/08/2009  . BARRETTS ESOPHAGUS 05/08/2009  . DIVERTICULOSIS OF COLON 05/08/2009  . ABDOMINAL BLOATING 05/08/2009    Past Surgical History:  Procedure Laterality Date  . BALLOON DILATION N/A 10/19/2016   Procedure: BALLOON DILATION;  Surgeon: Ladene Artist, MD;  Location: [facility];  Service: Endoscopy;  Laterality: N/A;  . COLONOSCOPY W/ POLYPECTOMY    . ESOPHAGOGASTRODUODENOSCOPY N/A 10/19/2016   Procedure: ESOPHAGOGASTRODUODENOSCOPY (EGD);  Surgeon: Ladene Artist, MD;  Location: [facility];  Service: Endoscopy;  Laterality: N/A;  . ESOPHAGOGASTRODUODENOSCOPY (EGD) WITH PROPOFOL N/A 02/12/2018   Procedure: ESOPHAGOGASTRODUODENOSCOPY (EGD) WITH PROPOFOL;  Surgeon: [name], MD;  Location: [facility];  Service: Gastroenterology;  Laterality: N/A;  . EUS N/A 02/12/2018   Procedure: UPPER ENDOSCOPIC ULTRASOUND (EUS) RADIAL;  Surgeon: [name], MD;  Location: [facility];  Service: Gastroenterology;  Laterality: N/A;  . EYE SURGERY Bilateral    cataract  . LUMBAR LAMINECTOMY     1980's        Home Medications    Prior to Admission medications   Medication Sig Start Date End Date Taking? Authorizing Provider  ALBUTEROL IN Inhale 2 puffs into the  lungs every 4 (four) hours as needed (shortness of breath or wheezing).     [provider]  amitriptyline (ELAVIL) 25 MG tablet Take 25 mg by mouth daily.  04/01/12   [provider]  aspirin EC 81 MG tablet Take 81 mg by mouth daily.    [provider]  atorvastatin (LIPITOR) 10 MG tablet Take 10 mg by mouth at bedtime.  03/22/16   [provider]  diclofenac (VOLTAREN) 75 MG EC tablet Take 75 mg by mouth daily as needed.  09/22/16   [provider]  Esomeprazole Magnesium 20 MG  TBEC Take 40 mg by mouth daily at 12 noon.     [provider]  HYDROcodone-acetaminophen (HYCET) 7.5-325 mg/15 ml solution Take 10 mLs by mouth every 6 (six) hours as needed for moderate pain. 07/05/18   Truitt Merle, MD  lisinopril (PRINIVIL,ZESTRIL) 20 MG tablet Take 20 mg by mouth daily.    [provider]  magic mouthwash w/lidocaine SOLN Take 5 mLs by mouth every 4 (four) hours as needed for mouth pain. 06/29/18   Kyung Rudd, MD  Multiple Vitamin (MULTIVITAMIN) tablet Take 1 tablet by mouth daily.    [provider]  mycophenolate (CELLCEPT) 500 MG tablet Take 2 tablets (1,000 mg total) by mouth 2 (two) times daily. 06/11/18   Penumalli, Earlean Polka, MD  ondansetron (ZOFRAN) 8 MG tablet Take 1 tablet (8 mg total) by mouth 2 (two) times daily as needed for refractory nausea / vomiting. Start on day 3 after chemo. 05/29/18   Truitt Merle, MD  prochlorperazine (COMPAZINE) 10 MG tablet Take 1 tablet (10 mg total) by mouth every 6 (six) hours as needed (Nausea or vomiting). 05/29/18   Truitt Merle, MD  pyridostigmine (MESTINON) 60 MG tablet Take 0.5-1 tablets (30-60 mg total) by mouth 3 (three) times daily. 06/11/18   Penumalli, Earlean Polka, MD  sucralfate (CARAFATE) 1 g tablet TAKE ONE TABLET BY MOUTH FOUR TIMES A DAY WITH MEALS AND AT BEDTIME AS DIRECTED 07/17/18   Truitt Merle, MD  traMADol (ULTRAM) 50 MG tablet Take 50 mg by mouth every 6 (six) hours as needed for pain.    [provider]    Family History Family History  Problem Relation Age of Onset  . Heart disease Mother   . Lung cancer Father   . Colon cancer Neg Hx     Social History Social History   Tobacco Use  . Smoking status: Former Smoker    Packs/day: 1.00    Years: 7.00    Pack years: 7.00    Last attempt to quit: 12/21/1976    Years since quitting: 41.6  . Smokeless tobacco: Never Used  Substance Use Topics  . Alcohol use: Not Currently    Comment: ocassional  . Drug use: No     Allergies    Patient has no known allergies.   Review of Systems Review of Systems  Constitutional: Positive for appetite change and fatigue. Negative for fever.  HENT: Positive for sore throat. Negative for congestion.   Eyes: Negative for visual disturbance.  Respiratory: Positive for shortness of breath. Negative for cough.   Cardiovascular: Negative for chest pain.  Gastrointestinal: Positive for constipation, diarrhea and nausea. Negative for abdominal pain and vomiting.  Genitourinary: Negative for difficulty urinating.  Musculoskeletal: Negative for back pain and neck stiffness.  Skin: Negative for rash.  Neurological: Positive for light-headedness. Negative for syncope and headaches.     Physical Exam Updated Vital  Signs BP (!) 77/51 (BP Location: Left Arm)   Pulse 88   Temp (!) 97.4 F (36.3 C) (Oral)   Resp 16   SpO2 100%   Physical Exam Vitals signs and nursing note reviewed.  Constitutional:      General: He is not in acute distress.    Appearance: He is well-developed. He is ill-appearing. He is not diaphoretic.  HENT:     Head: Normocephalic and atraumatic.  Eyes:     Conjunctiva/sclera: Conjunctivae normal.  Neck:     Musculoskeletal: Normal range of motion.  Cardiovascular:     Rate and Rhythm: Normal rate and regular rhythm.     Heart sounds: Normal heart sounds. No murmur. No friction rub. No gallop.   Pulmonary:     Effort: Pulmonary effort is normal. No respiratory distress.     Breath sounds: Normal breath sounds. No wheezing or rales.  Abdominal:     General: There is no distension.     Palpations: Abdomen is soft.     Tenderness: There is no abdominal tenderness. There is no guarding.  Skin:    General: Skin is warm and dry.  Neurological:     Mental Status: He is alert and oriented to person, place, and time.      ED Treatments / Results  Labs (all labs ordered are listed, but only abnormal results are displayed) Labs Reviewed  CULTURE, BLOOD  (ROUTINE X 2)  CULTURE, BLOOD (ROUTINE X 2)  URINE CULTURE  LACTIC ACID, PLASMA  LACTIC ACID, PLASMA  URINALYSIS, ROUTINE W REFLEX MICROSCOPIC  I-STAT TROPONIN, ED    EKG None  Radiology No results found.  Procedures .Critical Care Performed by: Gareth Morgan, MD Authorized by: Gareth Morgan, MD   Critical care provider statement:    Critical care time (minutes):  45   Critical care was necessary to treat or prevent imminent or life-threatening deterioration of the following conditions:  Circulatory failure   Critical care was time spent personally by me on the following activities:  Blood draw for specimens, evaluation of patient's response to treatment, obtaining history from patient or surrogate, ordering and performing treatments and interventions, ordering and review of laboratory studies, ordering and review of radiographic studies, pulse oximetry and re-evaluation of patient's condition  Ultrasound ED Peripheral IV (Provider) Date/Time: 07/23/2018 9:18 PM Performed by: Gareth Morgan, MD Authorized by: Gareth Morgan, MD   Procedure details:    Indications: hydration, hypotension and multiple failed IV attempts     Skin Prep: chlorhexidine gluconate     Location:  Left AC   Angiocath:  20 G   Bedside Ultrasound Guided: Yes     Images: not archived     Patient tolerated procedure without complications: Yes     Dressing applied: Yes     (including critical care time)  Medications Ordered in ED Medications  ceFEPIme (MAXIPIME) 2 g in sodium chloride 0.9 % 100 mL IVPB (has no administration in time range)  metroNIDAZOLE (FLAGYL) IVPB 500 mg (has no administration in time range)  lactated ringers bolus 1,000 mL (1,000 mLs Intravenous New Bag/Given 07/23/18 1028)    And  lactated ringers bolus 1,000 mL (has no administration in time range)    And  lactated ringers bolus 1,000 mL (has no administration in time range)  vancomycin (VANCOCIN) 2,000 mg in sodium  chloride 0.9 % 500 mL IVPB (has no administration in time range)  sodium chloride 0.9 % bolus 1,000 mL (1,000 mLs Intravenous New  Bag/Given 07/23/18 1016)     Initial Impression / Assessment and Plan / ED Course  I have reviewed the triage vital signs and the nursing notes.  Pertinent labs & imaging results that were available during my care of the patient were reviewed by me and considered in my medical decision making (see chart for details).        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  He presents to the emergency department with hypotension and blood pressures down to the 43P systolic, however is able to answer questions normally, mentating.   DDx for hypotension includes dehydration, sepsis, medication effects, PE, cardiac.  Denies black or bloody stools, trauma, have low suspicion for bleed as etiology of hypertension.  Does not describe focal infectious symptoms, however given degree of hypotension and risk factors, will empirically treat for sepsis of unknown etiology with antibiotics.  Overall given history of poor p.o. intake over the last week, suspect most likely symptoms are secondary to severe dehydration.  Lab work obtained in the cancer center is also consistent with dehydration.  He has an acute kidney injury with a creatinine of 2.89 from previous of 1 on 310.  His sodium is 129.  White blood cells are 3.5.  Hemoglobin stable at 10.  EKG shows sinus rhythm.  PE remains on differential however given history of poor intake, at this time feel dehydration more likely.   Blood pressures improving with fluids, from 29J systolic up to MAPs above 65, upper 80s and 90s.  Feels improved. Overall, suspect dehydration as etiology. Will admit for continued care.    Final  Clinical Impressions(s) / ED Diagnoses   Final diagnoses:  Hypotension, unspecified hypotension type  Dehydration  Acute kidney injury [facility])    ED Discharge Orders    None       Gareth Morgan, MD 07/23/18 2119

## 2018-07-23 NOTE — H&P (Signed)
History and Physical   NASHAWN HILLOCK JIR:[id number] DOB: 04/08/1946 DOA: 07/23/2018  Referring MD/NP/PA: Dr. Billy Fischer, EDP PCP: [name], MD Outpatient Specialists: Dr. Burr Medico  Patient coming from: Home by way of cancer center  Chief Complaint: Hypotension, orthostasis, poor per oral intake and nausea.   HPI: Philip [name] is a 73 y.o. male with a history of esophageal CA at [place] junction, started carboplatin and taxol w/concurrent XRT Feb 2020 who presented to the cancer center for routine follow up 3/23 and was found to be hypotensive in the setting of having minimal per oral intake due to odynophagia. He was found to have AKI and was referred to the ED for further workup. He reports constantly worsening pain when swallowing which was not significantly improved with carafate over the past week. He denies any syncope, but does feel light headed with any exertion. He has been feeling more weak, mobilizing less with walker. No falls at home.   ED Course: Initially hypotensive at 75/50, HR 88, afebrile. Labs from cancer center had shown creatinine 2.89, BUN 44, bicarb down to 18, Na 129, calcium stable at 11.5. WBC 3.5, hgb 10.2, platelets improved at 225. Due to suspicion for sepsis despite negative CXR, broad antibiotics and 4L IVFs given. Urinalysis was pending at time of call for admission, though BP had improved. He was unable to void, bladder scan unavailable, I/O cath returned >500cc, so catheter left in place.   Review of Systems: Denies fever, chills, changes in vision or hearing, headache, cough, chest pain, palpitations, shortness of breath, abdominal pain, vomiting, has had some diarrhea recently but none over past couple days, denies dysuria but was unable to urinate in ED. No myalgias, arthralgias, or rash, and per HPI. All others reviewed and are negative.   Past Medical History:  Diagnosis Date  . Abdominal hernia   . Anemia   . Asthma   . Barrett's esophagus   . Cancer  (HCC)    Esophagel cancer  . Cataract    Bil surgery  . Cough    over 2 weeks  . Fluid retention in legs   . GERD (gastroesophageal reflux disease)   . H/O hiatal hernia   . Hypertension   . Myasthenia gravis ([place])   . Sleep apnea    don't use c-pap at home/ tried for several years  . SOB (shortness of breath)    Past Surgical History:  Procedure Laterality Date  . BALLOON DILATION N/A 10/19/2016   Procedure: BALLOON DILATION;  Surgeon: Ladene Artist, MD;  Location: [facility];  Service: Endoscopy;  Laterality: N/A;  . COLONOSCOPY W/ POLYPECTOMY    . ESOPHAGOGASTRODUODENOSCOPY N/A 10/19/2016   Procedure: ESOPHAGOGASTRODUODENOSCOPY (EGD);  Surgeon: Ladene Artist, MD;  Location: [facility];  Service: Endoscopy;  Laterality: N/A;  . ESOPHAGOGASTRODUODENOSCOPY (EGD) WITH PROPOFOL N/A 02/12/2018   Procedure: ESOPHAGOGASTRODUODENOSCOPY (EGD) WITH PROPOFOL;  Surgeon: [name], MD;  Location: [facility];  Service: Gastroenterology;  Laterality: N/A;  . EUS N/A 02/12/2018   Procedure: UPPER ENDOSCOPIC ULTRASOUND (EUS) RADIAL;  Surgeon: [name], MD;  Location: [facility];  Service: Gastroenterology;  Laterality: N/A;  . EYE SURGERY Bilateral    cataract  . LUMBAR LAMINECTOMY     1980's   - Remote smoker, no EtOH.  reports that he quit smoking about 41 years ago. He has a 7.00 pack-year smoking history. He has never used smokeless tobacco. He reports previous alcohol use. He reports that he does not  use drugs. No Known Allergies Family History  Problem Relation Age of Onset  . Heart disease Mother   . Lung cancer Father   . Colon cancer Neg Hx    - Family history otherwise reviewed and not pertinent.  Prior to Admission medications   Medication Sig Start Date End Date Taking? Authorizing Provider  ALBUTEROL IN Inhale 2 puffs into the lungs every 4 (four) hours as needed (shortness of breath or wheezing).    Yes [provider]   amitriptyline (ELAVIL) 25 MG tablet Take 25 mg by mouth daily.  04/01/12  Yes [provider]  aspirin EC 81 MG tablet Take 81 mg by mouth daily.   Yes [provider]  atorvastatin (LIPITOR) 10 MG tablet Take 10 mg by mouth at bedtime.  03/22/16  Yes [provider]  Esomeprazole Magnesium 20 MG TBEC Take 40 mg by mouth daily at 12 noon.    Yes [provider]  HYDROcodone-acetaminophen (HYCET) 7.5-325 mg/15 ml solution Take 10 mLs by mouth every 6 (six) hours as needed for moderate pain. 07/05/18  Yes Truitt Merle, MD  lisinopril (PRINIVIL,ZESTRIL) 20 MG tablet Take 20 mg by mouth daily.   Yes [provider]  magic mouthwash w/lidocaine SOLN Take 5 mLs by mouth every 4 (four) hours as needed for mouth pain. 06/29/18  Yes Kyung Rudd, MD  Multiple Vitamin (MULTIVITAMIN) tablet Take 1 tablet by mouth daily.   Yes [provider]  mycophenolate (CELLCEPT) 500 MG tablet Take 2 tablets (1,000 mg total) by mouth 2 (two) times daily. 06/11/18  Yes Penumalli, Earlean Polka, MD  ondansetron (ZOFRAN) 8 MG tablet Take 1 tablet (8 mg total) by mouth 2 (two) times daily as needed for refractory nausea / vomiting. Start on day 3 after chemo. 05/29/18  Yes Truitt Merle, MD  prochlorperazine (COMPAZINE) 10 MG tablet Take 1 tablet (10 mg total) by mouth every 6 (six) hours as needed (Nausea or vomiting). 05/29/18  Yes Truitt Merle, MD  pyridostigmine (MESTINON) 60 MG tablet Take 0.5-1 tablets (30-60 mg total) by mouth 3 (three) times daily. 06/11/18  Yes Penumalli, Earlean Polka, MD  traMADol (ULTRAM) 50 MG tablet Take 50 mg by mouth every 6 (six) hours as needed for pain.   Yes [provider]  sucralfate (CARAFATE) 1 g tablet TAKE ONE TABLET BY MOUTH FOUR TIMES A DAY WITH MEALS AND AT BEDTIME AS DIRECTED Patient taking differently: Take 1 g by mouth 4 (four) times daily -  with meals and at bedtime.  07/17/18   Truitt Merle, MD    Physical Exam: Vitals:   07/23/18 1545  07/23/18 1600 07/23/18 1615 07/23/18 1630  BP: 108/68 102/65 105/67 92/62  Pulse:  82    Resp:  17    Temp:      TempSrc:      SpO2: 100% 100% 100% 98%   Constitutional: Obese, frail 73yo male in no distress, calm demeanor Eyes: Lids and conjunctivae normal, PERRL ENMT: Mucous membranes are tacky. Posterior pharynx clear of any exudate or lesions. Poor dentition.  Neck: normal, supple, no masses, no thyromegaly Respiratory: Non-labored breathing without accessory muscle use. Clear breath sounds to auscultation bilaterally Cardiovascular: Regular rate and rhythm, no murmurs, rubs, or gallops. No carotid bruits. No JVD. Trace LE edema. Palpable pedal pulses. Abdomen: Normoactive bowel sounds. No tenderness, non-distended, and no masses palpated. No hepatosplenomegaly. GU: + indwelling catheter Musculoskeletal: No clubbing / cyanosis. No joint deformity upper and lower extremities. Good ROM,  no contractures. Normal muscle tone.  Skin: Warm, dry. No rashes, wounds, or ulcers. No significant lesions noted.  Neurologic: CN II-XII grossly intact. Speech normal. No focal deficits in motor strength or sensation in all extremities.  Psychiatric: Alert and oriented x3. Normal judgment and insight.   Labs on Admission: I have personally reviewed following labs and imaging studies  CBC: Recent Labs  Lab 07/23/18 0856  WBC 3.5*  NEUTROABS 2.1  HGB 10.2*  HCT 29.6*  MCV 94.0  PLT 716   Basic Metabolic Panel: Recent Labs  Lab 07/23/18 0856  NA 129*  K 4.5  CL 99  CO2 18*  GLUCOSE 107*  BUN 44*  CREATININE 2.89*  CALCIUM 11.5*   GFR: Estimated Creatinine Clearance: 30.8 mL/min (A) (by C-G formula based on SCr of 2.89 mg/dL (H)). Liver Function Tests: Recent Labs  Lab 07/23/18 0856  AST 23  ALT 29  ALKPHOS 81  BILITOT 1.2  PROT 6.0*  ALBUMIN 3.4*   Urine analysis:    Component Value Date/Time   COLORURINE YELLOW 07/23/2018 1459   APPEARANCEUR CLOUDY (A) 07/23/2018 1459    LABSPEC 1.015 07/23/2018 1459   PHURINE 5.0 07/23/2018 1459   GLUCOSEU NEGATIVE 07/23/2018 1459   HGBUR LARGE (A) 07/23/2018 1459   BILIRUBINUR NEGATIVE 07/23/2018 1459   KETONESUR NEGATIVE 07/23/2018 1459   PROTEINUR NEGATIVE 07/23/2018 1459   NITRITE NEGATIVE 07/23/2018 1459   LEUKOCYTESUR SMALL (A) 07/23/2018 1459    Recent Results (from the past 240 hour(s))  Blood Culture (routine x 2)     Status: None (Preliminary result)   Collection Time: 07/23/18 10:19 AM  Result Value Ref Range Status   Specimen Description   Final    BLOOD RIGHT ANTECUBITAL Performed at [facility], [place] [address] [place], [place] [address]    Special Requests   Final    BOTTLES DRAWN AEROBIC AND ANAEROBIC Blood Culture adequate volume Performed at [facility] [address], [place], [place] [address]    Culture PENDING  Incomplete   Report Status PENDING  Incomplete     Radiological Exams on Admission: Dg Chest Port 1 View  Result Date: 07/23/2018 CLINICAL DATA:  Hypotension. Denies chest complaints. History of esophageal cancer. EXAM: PORTABLE CHEST 1 VIEW COMPARISON:  PET-CT dated May 29, 2018. FINDINGS: The heart size and mediastinal contours are within normal limits. Both lungs are clear. The visualized skeletal structures are unremarkable. IMPRESSION: No active disease. Electronically Signed   By: Titus Dubin M.D.   On: 07/23/2018 11:00   Assessment/Plan Active Problems:   AKI (acute kidney injury) ([place])   AKI with non-anion gap metabolic acidosis: FENa is 0.4% more consistent with pre/post renal than intrarenal etiology. Was also hypotensive and took lisinopril today.  - Was given a total of 4L IVF in ED. Has hx diastolic dysfunction, so we'll hold on further fluids. Pt is hungry and can eat/drink ad lib. Recheck labs in AM - Was acutely retaining urine, so urinary catheter placed 3/23. Consider voiding trial prior to discharge. - Check renal U/S -  Avoid hypotension - Hold ACEi  Sepsis due to UTI: Possible cause of urinary retention and hypotension. Has pyuria, bacteriuria with leukopenia and hypotension. - Continue ceftriaxone (narrowed from vancomycin, cefepime flagyl given in ED for undifferentiated sepsis) empirically. - Monitor blood cultures, urine culture sent.   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  - Patient of Dr. Burr Medico who has  been added to the treatment team.   Dysphagia and odynophagia: Complication of radiation therapy.  - Continue liquid hycet and carafate.   Hyponatremia:  - Monitor in AM after isotonic IVF given.   HTN:  - Hold lisinopril  Myasthenia gravis: No exacerbation at this time.  - Continue cellcept, mestinon  GERD:  - Continue PPI, carafate  Hypercalcemia: Stable  Chronic back pain:  - Continue home pain medications.  DVT prophylaxis: Heparin [place]  Code Status: Full  Family Communication: None at bedside Disposition Plan: Home once improved Consults called: None  Admission status: Observation    Patrecia Pour, MD [facility] [email or web address] Password [facility] 07/23/2018, 4:51 PM

## 2018-07-23 NOTE — Progress Notes (Signed)
A consult was received from an ED physician for Vancomycin and Cefepime per pharmacy dosing.  The patient's profile has been reviewed for ht/wt/allergies/indication/available labs.    A one time order has been placed for Cefepime 2gm, Vancomycin 2gm, and Flagyl 500mg  x1 per MD.  Further antibiotics/pharmacy consults should be ordered by admitting physician if indicated.                       Thank you, Minda Ditto 07/23/2018  10:30 AM

## 2018-07-23 NOTE — ED Triage Notes (Addendum)
Pt sent from [facility] for hypotension. Pt feels fatigue, decreased appetite, and dizziness. Denies other.

## 2018-07-24 ENCOUNTER — Telehealth: Payer: Self-pay | Admitting: Hematology

## 2018-07-24 DIAGNOSIS — N3000 Acute cystitis without hematuria: Secondary | ICD-10-CM

## 2018-07-24 DIAGNOSIS — M549 Dorsalgia, unspecified: Secondary | ICD-10-CM | POA: Diagnosis present

## 2018-07-24 DIAGNOSIS — R339 Retention of urine, unspecified: Secondary | ICD-10-CM

## 2018-07-24 DIAGNOSIS — Z923 Personal history of irradiation: Secondary | ICD-10-CM | POA: Diagnosis not present

## 2018-07-24 DIAGNOSIS — Z9841 Cataract extraction status, right eye: Secondary | ICD-10-CM | POA: Diagnosis not present

## 2018-07-24 DIAGNOSIS — I10 Essential (primary) hypertension: Secondary | ICD-10-CM | POA: Diagnosis present

## 2018-07-24 DIAGNOSIS — G7000 Myasthenia gravis without (acute) exacerbation: Secondary | ICD-10-CM | POA: Diagnosis present

## 2018-07-24 DIAGNOSIS — E871 Hypo-osmolality and hyponatremia: Secondary | ICD-10-CM | POA: Diagnosis present

## 2018-07-24 DIAGNOSIS — Z79899 Other long term (current) drug therapy: Secondary | ICD-10-CM | POA: Diagnosis not present

## 2018-07-24 DIAGNOSIS — E440 Moderate protein-calorie malnutrition: Secondary | ICD-10-CM | POA: Diagnosis present

## 2018-07-24 DIAGNOSIS — D6481 Anemia due to antineoplastic chemotherapy: Secondary | ICD-10-CM | POA: Diagnosis present

## 2018-07-24 DIAGNOSIS — Z8249 Family history of ischemic heart disease and other diseases of the circulatory system: Secondary | ICD-10-CM | POA: Diagnosis not present

## 2018-07-24 DIAGNOSIS — I959 Hypotension, unspecified: Secondary | ICD-10-CM | POA: Diagnosis not present

## 2018-07-24 DIAGNOSIS — N390 Urinary tract infection, site not specified: Secondary | ICD-10-CM | POA: Diagnosis present

## 2018-07-24 DIAGNOSIS — K219 Gastro-esophageal reflux disease without esophagitis: Secondary | ICD-10-CM | POA: Diagnosis present

## 2018-07-24 DIAGNOSIS — D72829 Elevated white blood cell count, unspecified: Secondary | ICD-10-CM | POA: Diagnosis not present

## 2018-07-24 DIAGNOSIS — K2270 Barrett's esophagus without dysplasia: Secondary | ICD-10-CM | POA: Diagnosis present

## 2018-07-24 DIAGNOSIS — A419 Sepsis, unspecified organism: Secondary | ICD-10-CM | POA: Diagnosis present

## 2018-07-24 DIAGNOSIS — N179 Acute kidney failure, unspecified: Secondary | ICD-10-CM | POA: Diagnosis present

## 2018-07-24 DIAGNOSIS — Y842 Radiological procedure and radiotherapy as the cause of abnormal reaction of the patient, or of later complication, without mention of misadventure at the time of the procedure: Secondary | ICD-10-CM | POA: Diagnosis present

## 2018-07-24 DIAGNOSIS — G8929 Other chronic pain: Secondary | ICD-10-CM | POA: Diagnosis present

## 2018-07-24 DIAGNOSIS — D649 Anemia, unspecified: Secondary | ICD-10-CM | POA: Diagnosis not present

## 2018-07-24 DIAGNOSIS — C160 Malignant neoplasm of cardia: Secondary | ICD-10-CM | POA: Diagnosis present

## 2018-07-24 DIAGNOSIS — Z79891 Long term (current) use of opiate analgesic: Secondary | ICD-10-CM | POA: Diagnosis not present

## 2018-07-24 DIAGNOSIS — E860 Dehydration: Secondary | ICD-10-CM | POA: Diagnosis present

## 2018-07-24 DIAGNOSIS — Z7982 Long term (current) use of aspirin: Secondary | ICD-10-CM | POA: Diagnosis not present

## 2018-07-24 DIAGNOSIS — J45909 Unspecified asthma, uncomplicated: Secondary | ICD-10-CM | POA: Diagnosis present

## 2018-07-24 DIAGNOSIS — Z9842 Cataract extraction status, left eye: Secondary | ICD-10-CM | POA: Diagnosis not present

## 2018-07-24 DIAGNOSIS — G4733 Obstructive sleep apnea (adult) (pediatric): Secondary | ICD-10-CM | POA: Diagnosis present

## 2018-07-24 LAB — URINE CULTURE: Culture: NO GROWTH

## 2018-07-24 LAB — CBC WITH DIFFERENTIAL/PLATELET
Abs Immature Granulocytes: 0.03 10*3/uL (ref 0.00–0.07)
Basophils Absolute: 0 10*3/uL (ref 0.0–0.1)
Basophils Relative: 0 %
EOS PCT: 1 %
Eosinophils Absolute: 0 10*3/uL (ref 0.0–0.5)
HCT: 26.6 % — ABNORMAL LOW (ref 39.0–52.0)
Hemoglobin: 8.7 g/dL — ABNORMAL LOW (ref 13.0–17.0)
Immature Granulocytes: 1 %
Lymphocytes Relative: 9 %
Lymphs Abs: 0.2 10*3/uL — ABNORMAL LOW (ref 0.7–4.0)
MCH: 32.1 pg (ref 26.0–34.0)
MCHC: 32.7 g/dL (ref 30.0–36.0)
MCV: 98.2 fL (ref 80.0–100.0)
Monocytes Absolute: 0.5 10*3/uL (ref 0.1–1.0)
Monocytes Relative: 24 %
Neutro Abs: 1.5 10*3/uL — ABNORMAL LOW (ref 1.7–7.7)
Neutrophils Relative %: 65 %
Platelets: 156 10*3/uL (ref 150–400)
RBC: 2.71 MIL/uL — ABNORMAL LOW (ref 4.22–5.81)
RDW: 18.2 % — ABNORMAL HIGH (ref 11.5–15.5)
WBC: 2.3 10*3/uL — ABNORMAL LOW (ref 4.0–10.5)
nRBC: 0 % (ref 0.0–0.2)

## 2018-07-24 LAB — BASIC METABOLIC PANEL
Anion gap: 7 (ref 5–15)
BUN: 36 mg/dL — ABNORMAL HIGH (ref 8–23)
CO2: 23 mmol/L (ref 22–32)
CREATININE: 1.19 mg/dL (ref 0.61–1.24)
Calcium: 10.3 mg/dL (ref 8.9–10.3)
Chloride: 103 mmol/L (ref 98–111)
GFR calc Af Amer: >60 mL/min (ref >60)
GFR calc non Af Amer: >60 mL/min (ref >60)
Glucose, Bld: 95 mg/dL (ref 70–99)
Potassium: 4.5 mmol/L (ref 3.5–5.1)
Sodium: 133 mmol/L — ABNORMAL LOW (ref 135–145)

## 2018-07-24 LAB — RETICULOCYTES
Immature Retic Fract: 31.1 % — ABNORMAL HIGH (ref 2.3–15.9)
RBC.: 2.63 MIL/uL — ABNORMAL LOW (ref 4.22–5.81)
Retic Count, Absolute: 71.3 10*3/uL (ref 19.0–186.0)
Retic Ct Pct: 2.7 % (ref 0.4–3.1)

## 2018-07-24 LAB — VITAMIN B12: Vitamin B-12: 713 pg/mL (ref 180–914)

## 2018-07-24 LAB — IRON AND TIBC
Iron: 77 ug/dL (ref 45–182)
Saturation Ratios: 31 % (ref 17.9–39.5)
TIBC: 251 ug/dL (ref 250–450)
UIBC: 174 ug/dL

## 2018-07-24 LAB — FERRITIN: Ferritin: 586 ng/mL — ABNORMAL HIGH (ref 24–336)

## 2018-07-24 LAB — FOLATE: Folate: 14.4 ng/mL (ref >5.9)

## 2018-07-24 MED ORDER — SODIUM CHLORIDE 0.9 % IV SOLN
INTRAVENOUS | Status: DC
  Administered 2018-07-24: 10:00:00 via INTRAVENOUS

## 2018-07-24 MED ORDER — MAGIC MOUTHWASH W/LIDOCAINE
10.0000 mL | Freq: Four times a day (QID) | ORAL | Status: DC
  Administered 2018-07-24 – 2018-07-25 (×7): 10 mL via ORAL
  Filled 2018-07-24 (×11): qty 10

## 2018-07-24 MED ORDER — TAMSULOSIN HCL 0.4 MG PO CAPS
0.4000 mg | ORAL_CAPSULE | Freq: Every day | ORAL | Status: DC
  Administered 2018-07-24 – 2018-07-25 (×2): 0.4 mg via ORAL
  Filled 2018-07-24 (×2): qty 1

## 2018-07-24 MED ORDER — SODIUM CHLORIDE 0.9 % IV SOLN
INTRAVENOUS | Status: DC
  Administered 2018-07-25 – 2018-07-26 (×3): via INTRAVENOUS

## 2018-07-24 MED ORDER — ENSURE ENLIVE PO LIQD
237.0000 mL | Freq: Two times a day (BID) | ORAL | Status: DC
  Administered 2018-07-24: 237 mL via ORAL

## 2018-07-24 NOTE — Progress Notes (Signed)
PROGRESS NOTE    Philip [name]  WNU:[id number] DOB: Apr 11, 1946 DOA: 07/23/2018 PCP: [name], MD   Brief Narrative:  Per Dr Aldona Lento [name] is a 73 y.o. male with a history of esophageal CA at [place] junction, started carboplatin and taxol w/concurrent XRT Feb 2020 who presented to the cancer center for routine follow up 3/23 and was found to be hypotensive in the setting of having minimal per oral intake due to odynophagia. He was found to have AKI and was referred to the ED for further workup. He reports constantly worsening pain when swallowing which was not significantly improved with carafate over the past week. He denies any syncope, but does feel light headed with any exertion. He has been feeling more weak, mobilizing less with walker. No falls at home.   ED Course: Initially hypotensive at 75/50, HR 88, afebrile. Labs from cancer center had shown creatinine 2.89, BUN 44, bicarb down to 18, Na 129, calcium stable at 11.5. WBC 3.5, hgb 10.2, platelets improved at 225. Due to suspicion for sepsis despite negative CXR, broad antibiotics and 4L IVFs given. Urinalysis was pending at time of call for admission, though BP had improved. He was unable to void, bladder scan unavailable, I/O cath returned >500cc, so catheter left in place.    Assessment & Plan:   Principal Problem:   AKI (acute kidney injury) ([place]) Active Problems:   Dysphagia   Myasthenia gravis ([place])   Malignant neoplasm of gastroesophageal junction (HCC)   Hypertension   UTI (urinary tract infection)   Odynophagia   Hyponatremia   Hypercalcemia   Anemia   ARF (acute renal failure) (H[facility])   Dehydration   Hypotension   Urinary retention  1 acute renal failure with non-anion gap metabolic acidosis Likely secondary to prerenal azotemia as patient noted to be hypotensive on admission with systolic blood pressures in the 60s in the setting of ACE inhibitor.  Fractional secretion of sodium noted to be 0.4%  consistent with pre-/post renal azotemia.  Patient also noted to have some urinary retention and as such Foley catheter placed.  Patient placed on IV fluids.  Renal ultrasound negative for hydronephrosis.  Continue to hold ACE inhibitor.  Renal function improving with hydration.  Avoid nephrotoxins.  Follow.  2.  Sepsis secondary to UTI Patient noted to present with sepsis physiology with significant hypotension with systolic blood pressures in the 60s, urinalysis consistent with UTI with pyuria, bacteriuria with leukopenia.  Patient pancultured.  Urine cultures negative.  Blood pressure still borderline with BP 96/48.  Chest x-ray negative for any acute infiltrate.  Continue empiric IV Rocephin.  Continue IV fluids.  Follow.  3.  Hypotension Likely multifactorial secondary to problem #2 and volume depletion with decreased oral intake.  Blood pressure of 96/48 today.  Continue IV fluids.  Continue empiric IV Rocephin.  Blood cultures pending.  Follow.  4.  Hyponatremia Likely secondary to hypovolemic hyponatremia.  Improved with hydration.  Follow.  5.  Dysphasia and odynophagia Likely secondary to radiation treatments.  Continue Carafate and Hycet.  Placed on Magic mouthwash with lidocaine due to concerns for mucositis.  Oropharynx clear with no signs of thrush.  Follow.  6.  Hypertension Patient noted to be hypotensive on admission.  Blood pressure of 96/48.  Continue to hold ACE inhibitor.  7.  Gastroesophageal reflux disease Continue PPI, Carafate.  8.  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 Patient deemed not a good  surgical candidate.  Dr. Burr Medico with oncology following.  9.  Myasthenia gravis Continue CellCept and Mestinon.  Follow-up with neurology in the outpatient setting.  10.  Chronic back pain Continue home pain regimen.  11.  Hypercalcemia Improving with hydration.  Continue IV fluids.  Follow.  12.  Anemia Likely  dilutional versus secondary to chemotherapy.  Patient denies any overt bleeding.  Check anemia panel.  DC heparin.  Transfusion threshold hemoglobin less than 7.  13.  Urinary retention Patient noted to have some urinary retention yesterday and as such Foley catheter was placed as patient noted to have greater than 500 cc on bladder scan.  Patient wants his Foley catheter out and insistent on discontinuation of Foley catheter.  Patient hesitant to be started on Flomax.  DC Foley catheter.  Follow urine output.  If patient still with urinary retention will place on Flomax and reinsert Foley catheter.   DVT prophylaxis: SCDs. D/c heparin Code Status: Full Family Communication: Updated patient.  No family at bedside. Disposition Plan: Likely home when clinically improved.  Patient still requires inpatient treatment as patient presented with hypotension, blood pressure still borderline requiring IV fluid resuscitation, patient with odynophagia and dysphagia with poor oral intake.  Patient also requiring IV antibiotics.   Consultants:   Oncology: Dr. Burr Medico 07/24/2018  Procedures:   Chest x-ray 07/23/2018  Renal ultrasound 07/23/2018  Antimicrobials:  IV cefepime x1 07/23/2018  IV Rocephin 07/23/2018   Subjective: Patient states still with odynophagia. Patient wants Foley catheter taken out.  Feeling better.  No chest pain.  No shortness of breath.  Objective: Vitals:   07/24/18 0447 07/24/18 1258 07/24/18 1456 07/24/18 1709  BP: 105/64 (!) 96/48 (!) 105/58 (!) 104/55  Pulse: 95 99 95 (!) 101  Resp: 17 20 20 20   Temp: 99.3 F (37.4 C) 98.3 F (36.8 C) 99.7 F (37.6 C) 98.7 F (37.1 C)  TempSrc: Oral Oral Oral Oral  SpO2: 100% 96% 96% 96%  Weight:      Height:        Intake/Output Summary (Last 24 hours) at 07/24/2018 1729 Last data filed at 07/24/2018 0900 Gross per 24 hour  Intake 805.4 ml  Output 1600 ml  Net -794.6 ml   Filed Weights   07/23/18 1722  Weight: 119 kg     Examination:  General exam: NAD Respiratory system: CTAB. No wheezing, no rhonchi, no crackles.  Normal respiratory effort. Cardiovascular system: Regular rate and rhythm no murmurs rubs or gallops.  No JVD.  No lower extremity edema.  Gastrointestinal system: Abdomen is nondistended, soft and nontender. No organomegaly or masses felt. Normal bowel sounds heard. Central nervous system: Alert and oriented. No focal neurological deficits. Extremities: Symmetric 5 x 5 power. Skin: No rashes, lesions or ulcers Psychiatry: Judgement and insight appear normal. Mood & affect appropriate.     Data Reviewed: I have personally reviewed following labs and imaging studies  CBC: Recent Labs  Lab 07/23/18 0856 07/24/18 0349  WBC 3.5* 2.3*  NEUTROABS 2.1 1.5*  HGB 10.2* 8.7*  HCT 29.6* 26.6*  MCV 94.0 98.2  PLT 225 130   Basic Metabolic Panel: Recent Labs  Lab 07/23/18 0856 07/24/18 0349  NA 129* 133*  K 4.5 4.5  CL 99 103  CO2 18* 23  GLUCOSE 107* 95  BUN 44* 36*  CREATININE 2.89* 1.19  CALCIUM 11.5* 10.3   GFR: Estimated Creatinine Clearance: 74.8 mL/min (by C-G formula based on SCr of 1.19 mg/dL). Liver Function Tests:  Recent Labs  Lab 07/23/18 0856  AST 23  ALT 29  ALKPHOS 81  BILITOT 1.2  PROT 6.0*  ALBUMIN 3.4*   No results for input(s): LIPASE, AMYLASE in the last 168 hours. No results for input(s): AMMONIA in the last 168 hours. Coagulation Profile: No results for input(s): INR, PROTIME in the last 168 hours. Cardiac Enzymes: No results for input(s): CKTOTAL, CKMB, CKMBINDEX, TROPONINI in the last 168 hours. BNP (last 3 results) No results for input(s): PROBNP in the last 8760 hours. HbA1C: No results for input(s): HGBA1C in the last 72 hours. CBG: No results for input(s): GLUCAP in the last 168 hours. Lipid Profile: No results for input(s): CHOL, HDL, LDLCALC, TRIG, CHOLHDL, LDLDIRECT in the last 72 hours. Thyroid Function Tests: No results for  input(s): TSH, T4TOTAL, FREET4, T3FREE, THYROIDAB in the last 72 hours. Anemia Panel: Recent Labs    07/24/18 0354 07/24/18 0908  VITAMINB12  --  713  FOLATE  --  14.4  FERRITIN  --  586*  TIBC  --  251  IRON  --  77  RETICCTPCT 2.7  --    Sepsis Labs: Recent Labs  Lab 07/23/18 1014 07/23/18 1332  LATICACIDVEN 2.0* 1.9    Recent Results (from the past 240 hour(s))  Blood Culture (routine x 2)     Status: None (Preliminary result)   Collection Time: 07/23/18 10:14 AM  Result Value Ref Range Status   Specimen Description   Final    BLOOD LEFT ANTECUBITAL Performed at [facility] [address], [place], [place] [address]    Special Requests   Final    BOTTLES DRAWN AEROBIC AND ANAEROBIC Blood Culture adequate volume Performed at [facility] [address] [place], [place] [address]    Culture   Final    NO GROWTH 1 DAY Performed at [facility], [place] [address] [place], [place] [address]    Report Status PENDING  Incomplete  Blood Culture (routine x 2)     Status: None (Preliminary result)   Collection Time: 07/23/18 10:19 AM  Result Value Ref Range Status   Specimen Description   Final    BLOOD RIGHT ANTECUBITAL Performed at [facility], [place] [address] [place], [place] [address]    Special Requests   Final    BOTTLES DRAWN AEROBIC AND ANAEROBIC Blood Culture adequate volume Performed at [facility] [address], [place], [place] [address]    Culture   Final    NO GROWTH 1 DAY Performed at [facility], [place] [address] [place], [place] [address]    Report Status PENDING  Incomplete  Urine culture     Status: None   Collection Time: 07/23/18  2:59 PM  Result Value Ref Range Status   Specimen Description   Final    URINE, RANDOM Performed at [facility] [address], [place], [place] [address]    Special Requests   Final    NONE Performed at [facility], [place] [address] [place], [place] [address]    Culture   Final    NO GROWTH Performed at [facility], [place] [address] [place], [place] [address]    Report Status 07/24/2018 FINAL  Final         Radiology Studies: US Renal  Result Date: 07/23/2018 CLINICAL DATA:  Urinary retention.  Acute kidney injury. EXAM: RENAL / URINARY TRACT ULTRASOUND  COMPLETE COMPARISON:  PET CT 05/29/2018 FINDINGS: Right Kidney: Renal measurements: 11.5 x 6.2 x 5.8 cm = volume: 218 mL. Echogenicity within normal limits. No mass or hydronephrosis visualized. Left Kidney: Renal measurements: 12.7 x 5.9 x 6.9 cm = volume: 269 mL. Echogenicity within normal limits. No mass or hydronephrosis visualized. Bladder: Nondistended and not evaluated. IMPRESSION: Unremarkable renal ultrasound.  No hydronephrosis. Nondistended bladder. Electronically Signed   By: Keith Rake M.D.   On: 07/23/2018 20:21   Dg Chest Port 1 View  Result Date: 07/23/2018 CLINICAL DATA:  Hypotension. Denies chest complaints. History of esophageal cancer. EXAM: PORTABLE CHEST 1 VIEW COMPARISON:  PET-CT dated May 29, 2018. FINDINGS: The heart size and mediastinal contours are within normal limits. Both lungs are clear. The visualized skeletal structures are unremarkable. IMPRESSION: No active disease. Electronically Signed   By: Titus Dubin M.D.   On: 07/23/2018 11:00        Scheduled Meds: . amitriptyline  25 mg Oral QHS  . aspirin EC  81 mg Oral Daily  . atorvastatin  10 mg Oral QHS  . feeding supplement (ENSURE ENLIVE)  237 mL Oral BID BM  . magic mouthwash w/lidocaine  10 mL Oral QID  . mycophenolate  1,000 mg Oral BID  . pantoprazole  40 mg Oral Daily  . pyridostigmine  60 mg Oral TID  . sucralfate  1 g Oral TID WC & HS   Continuous Infusions: . sodium chloride 100 mL/hr at 07/24/18 0945  . cefTRIAXone (ROCEPHIN)  IV 1 g (07/23/18 2328)     LOS: 0 days    Time spent: 56 mins     Irine Seal, MD [facility]  If 7PM-7AM, please contact night-coverage [email or web address] 07/24/2018, 5:29 PM

## 2018-07-24 NOTE — Evaluation (Signed)
Physical Therapy Evaluation Patient Details Name: Philip [name] MRN: [id number] DOB: [birth year] Today's Date: 07/24/2018   History of Present Illness  73 y.o. with stage I GE junction adenocarcinoma, status post chemotherapy and radiation, which he completed recently, admitted for hypotension and AKI  Clinical Impression  Pt admitted with above diagnosis. Pt currently with functional limitations due to the deficits listed below (see PT Problem List).  Pt will benefit from skilled PT to increase their independence and safety with mobility to allow discharge to the venue listed below.  Pt reports fatigue and weakness due to s/p cancer treatments and hx of myasthenia gravis.  Recommended HHPT however politely declines at this time.     Follow Up Recommendations Home health PT(pt declines however)    Equipment Recommendations  None recommended by PT    Recommendations for Other Services       Precautions / Restrictions Precautions Precautions: Fall      Mobility  Bed Mobility Overal bed mobility: Needs Assistance Bed Mobility: Supine to Sit;Sit to Supine     Supine to sit: Min guard Sit to supine: Min guard   General bed mobility comments: min/guard for lines, pt with difficulty and effort raising trunk  Transfers Overall transfer level: Needs assistance Equipment used: Rolling walker (2 wheeled) Transfers: Sit to/from Stand Sit to Stand: Min guard         General transfer comment: required UEs to self assist, min/guard for safety  Ambulation/Gait Ambulation/Gait assistance: Min guard Gait Distance (Feet): 80 Feet Assistive device: Rolling walker (2 wheeled) Gait Pattern/deviations: Step-through pattern;Decreased stride length;Trunk flexed;Wide base of support     General Gait Details: verbal cues for remaining inside RW and upright posture, distance limited by fatigue  Stairs            Wheelchair Mobility    Modified Rankin (Stroke Patients Only)        Balance Overall balance assessment: Mild deficits observed, not formally tested(pt denies any recent falls)                                           Pertinent Vitals/Pain Pain Assessment: No/denies pain    Home Living Family/patient expects to be discharged to:: Private residence Living Arrangements: Alone   Type of Home: House       Home Layout: One level Home Equipment: Environmental consultant - 2 wheels;Cane - single point;Electric scooter      Prior Function Level of Independence: Independent with assistive device(s)   Gait / Transfers Assistance Needed: using SPC prior to admission           Hand Dominance        Extremity/Trunk Assessment        Lower Extremity Assessment Lower Extremity Assessment: Generalized weakness       Communication   Communication: No difficulties  Cognition Arousal/Alertness: Awake/alert Behavior During Therapy: WFL for tasks assessed/performed Overall Cognitive Status: Within Functional Limits for tasks assessed                                        General Comments      Exercises     Assessment/Plan    PT Assessment Patient needs continued PT services  PT Problem List Decreased strength;Decreased mobility;Decreased activity tolerance;Decreased balance;Decreased knowledge of use  of DME       PT Treatment Interventions DME instruction;Functional mobility training;Balance training;Patient/family education;Gait training;Therapeutic activities;Stair training;Therapeutic exercise;Neuromuscular re-education    PT Goals (Current goals can be found in the Care Plan section)  Acute Rehab PT Goals PT Goal Formulation: With patient Time For Goal Achievement: 08/07/18 Potential to Achieve Goals: Good    Frequency Min 3X/week   Barriers to discharge        Co-evaluation               AM-PAC PT "6 Clicks" Mobility  Outcome Measure Help needed turning from your back to your side while  in a flat bed without using bedrails?: A Little Help needed moving from lying on your back to sitting on the side of a flat bed without using bedrails?: A Little Help needed moving to and from a bed to a chair (including a wheelchair)?: A Little Help needed standing up from a chair using your arms (e.g., wheelchair or bedside chair)?: A Little Help needed to walk in hospital room?: A Little Help needed climbing 3-5 steps with a railing? : A Lot 6 Click Score: 17    End of Session Equipment Utilized During Treatment: Gait belt Activity Tolerance: Patient tolerated treatment well Patient left: in bed;with call bell/phone within reach   PT Visit Diagnosis: Other abnormalities of gait and mobility (R26.89);Muscle weakness (generalized) (M62.81)    Time: 6387-5643 PT Time Calculation (min) (ACUTE ONLY): 12 min   Charges:   PT Evaluation $PT Eval Low Complexity: [place], PT, DPT Acute Rehabilitation Services Office: [phone] Pager: [phone]  Trena Platt 07/24/2018, 12:20 PM

## 2018-07-24 NOTE — Care Management Obs Status (Signed)
[facility] NOTIFICATION   Patient Details  Name: Philip [name] MRN: [id number] Date of Birth: 1945-12-19   Medicare Observation Status Notification Given:  Yes    Nila Nephew, LCSW 07/24/2018, 1:17 PM

## 2018-07-24 NOTE — Telephone Encounter (Signed)
No los per 3/23. °

## 2018-07-24 NOTE — Progress Notes (Signed)
Initial Nutrition Assessment  DOCUMENTATION CODES:   Obesity unspecified  INTERVENTION:   -Provide Ensure Enlive po BID, each supplement provides 350 kcal and 20 grams of protein -Provide Magic cup TID with meals, each supplement provides 290 kcal and 9 grams of protein  NUTRITION DIAGNOSIS:   Increased nutrient needs related to cancer and cancer related treatments as evidenced by estimated needs.  GOAL:   Patient will meet greater than or equal to 90% of their needs  MONITOR:   PO intake, Supplement acceptance, Labs, Weight trends, I & O's  REASON FOR ASSESSMENT:   Consult Assessment of nutrition requirement/status  ASSESSMENT:   73 y.o. male with a history of esophageal CA at [place] junction, started carboplatin and taxol w/concurrent XRT Feb 2020 who presented to the cancer center for routine follow up 3/23 and was found to be hypotensive in the setting of having minimal per oral intake due to odynophagia. Admitted with dehydration, hypotension and AKI.  **[place] to gather nutrition history. Per patient, he has a good appetite and feels hungry but he is having painful swallowing related to his recent radiation treatments. Pt has also been receiving chemotherapy treatments (started in February 2020). Pt states he tried to eat a pizza which he states tasted good but he was only able to finish half. He states it is easiest to eat soft foods such as cottage cheese and ice cream. Pt states Magic Mouthwash is working for him somewhat.   Pt is interested in receiving Ensure supplements and Magic Cups.   Per patient, UBW is 285 lb but states he did weigh more than this prior to treatments. Per weight records, pt has lost 44 lb since 2/5 (14% wt loss x almost 2 months, significant for time frame).   NUTRITION - FOCUSED PHYSICAL EXAM:  Unable to perform as department is working remotely.  Diet Order:   Diet Order            Diet  regular Room service appropriate? Yes; Fluid consistency: Thin  Diet effective now              EDUCATION NEEDS:   Education needs have been addressed  Skin:  Skin Assessment: Reviewed RN Assessment  Last BM:  3/21  Height:   Ht Readings from Last 1 Encounters:  07/23/18 6' (1.829 m)    Weight:   Wt Readings from Last 1 Encounters:  07/23/18 119 kg    Ideal Body Weight:  80.9 kg  BMI:  Body mass index is 35.58 kg/m.  Estimated Nutritional Needs:   Kcal:  2400-2600  Protein:  120-130g  Fluid:  2.4L/day  Clayton Bibles, MS, RD, LDN [employer] Dietitian Pager: [phone] After Hours Pager: [phone]

## 2018-07-24 NOTE — Progress Notes (Addendum)
Philip [name]   DOB:22-Oct-1945   PO#:[id number]   ERX#:[id number]  Oncology follow up/e-visit by phone   Pt is currently admitted to [facility] hospital, I called him and verified that I am speaking with the correct person using two identifiers. I discussed the limitations, risks, security and privacy concerns of performing an evaluation and management service by telephone and the availability of in person appointments. I also discussed with the patient that there may be a patient responsible charge related to this service. The patient expressed understanding and agreed to proceed   Subjective: Pt is well known to me, under my care for his GEJ cancer. He was admitted from my office/ED for dehydration, hypotension and AKI yesterday. I am out of office today, and called patient. He feels better, his main concerns is the foley catheter, and would like it to be removed if possible. He is able to keep liquid and soft diet down, he still has moderate odynophagia, improved with pain medication.  He is afebrile, hypotension resolved today. No other new complains.    Objective:  Vitals:   07/23/18 2120 07/24/18 0447  BP: (!) 87/53 105/64  Pulse: 90 95  Resp: 18 17  Temp: 98.1 F (36.7 C) 99.3 F (37.4 C)  SpO2: 98% 100%    Body mass index is 35.58 kg/m.  Intake/Output Summary (Last 24 hours) at 07/24/2018 1050 Last data filed at 07/24/2018 0900 Gross per 24 hour  Intake 5502.94 ml  Output 1800 ml  Net 3702.94 ml    Physical exam not performed.  CBG (last 3)  No results for input(s): GLUCAP in the last 72 hours.   Urine Studies No results for input(s): UHGB, CRYS in the last 72 hours.  Invalid input(s): UACOL, UAPR, USPG, UPH, UTP, UGL, UKET, UBIL, UNIT, UROB, ULEU, UEPI, UWBC, URBC, UBAC, CAST, UCOM, BILUA  Basic Metabolic Panel: Recent Labs  Lab 07/23/18 0856 07/24/18 0349  NA 129* 133*  K 4.5 4.5  CL 99 103  CO2 18* 23  GLUCOSE 107* 95  BUN 44* 36*  CREATININE 2.89* 1.19  CALCIUM  11.5* 10.3   GFR Estimated Creatinine Clearance: 74.8 mL/min (by C-G formula based on SCr of 1.19 mg/dL). Liver Function Tests: Recent Labs  Lab 07/23/18 0856  AST 23  ALT 29  ALKPHOS 81  BILITOT 1.2  PROT 6.0*  ALBUMIN 3.4*   No results for input(s): LIPASE, AMYLASE in the last 168 hours. No results for input(s): AMMONIA in the last 168 hours. Coagulation profile No results for input(s): INR, PROTIME in the last 168 hours.  CBC: Recent Labs  Lab 07/23/18 0856 07/24/18 0349  WBC 3.5* 2.3*  NEUTROABS 2.1 1.5*  HGB 10.2* 8.7*  HCT 29.6* 26.6*  MCV 94.0 98.2  PLT 225 156   Cardiac Enzymes: No results for input(s): CKTOTAL, CKMB, CKMBINDEX, TROPONINI in the last 168 hours. BNP: Invalid input(s): POCBNP CBG: No results for input(s): GLUCAP in the last 168 hours. D-Dimer No results for input(s): DDIMER in the last 72 hours. Hgb A1c No results for input(s): HGBA1C in the last 72 hours. Lipid Profile No results for input(s): CHOL, HDL, LDLCALC, TRIG, CHOLHDL, LDLDIRECT in the last 72 hours. Thyroid function studies No results for input(s): TSH, T4TOTAL, T3FREE, THYROIDAB in the last 72 hours.  Invalid input(s): FREET3 Anemia work up Recent Labs    07/24/18 0354 07/24/18 0908  VITAMINB12  --  713  FOLATE  --  14.4  FERRITIN  --  586*  TIBC  --  251  IRON  --  77  RETICCTPCT 2.7  --    Microbiology Recent Results (from the past 240 hour(s))  Blood Culture (routine x 2)     Status: None (Preliminary result)   Collection Time: Jul 24, 2018 10:19 AM  Result Value Ref Range Status   Specimen Description   Final    BLOOD RIGHT ANTECUBITAL Performed at [facility], [place] [address], [place], [place] [address]    Special Requests   Final    BOTTLES DRAWN AEROBIC AND ANAEROBIC Blood Culture adequate volume Performed at [facility] [address] [place], [place] [address]    Culture PENDING  Incomplete   Report Status PENDING  Incomplete       Studies:  US Renal  Result Date: 2018-07-24 CLINICAL DATA:  Urinary retention.  Acute kidney injury. EXAM: RENAL / URINARY TRACT ULTRASOUND COMPLETE COMPARISON:  PET CT 05/29/2018 FINDINGS: Right Kidney: Renal measurements: 11.5 x 6.2 x 5.8 cm = volume: 218 mL. Echogenicity within normal limits. No mass or hydronephrosis visualized. Left Kidney: Renal measurements: 12.7 x 5.9 x 6.9 cm = volume: 269 mL. Echogenicity within normal limits. No mass or hydronephrosis visualized. Bladder: Nondistended and not evaluated. IMPRESSION: Unremarkable renal ultrasound.  No hydronephrosis. Nondistended bladder. Electronically Signed   By: Keith Rake M.D.   On: July 24, 2018 20:21   Dg Chest Port 1 View  Result Date: 2018-07-24 CLINICAL DATA:  Hypotension. Denies chest complaints. History of esophageal cancer. EXAM: PORTABLE CHEST 1 VIEW COMPARISON:  PET-CT dated May 29, 2018. FINDINGS: The heart size and mediastinal contours are within normal limits. Both lungs are clear. The visualized skeletal structures are unremarkable. IMPRESSION: No active disease. Electronically Signed   By: Titus Dubin M.D.   On: 24-Jul-2018 11:00    Assessment: 73 y.o. with stage I GE junction adenocarcinoma, status post chemotherapy and radiation, which he completed a week ago, admitted for hypotension and AKI  1.  Hypotension likely secondary to dehydration, resolved 2. AKI, much improved 3. UTI, possible sepsis, antibiotics 4.  Stage I GE junction adenocarcinoma, post concurrent chemoradiation 5.  Dysphagia and odynophagia 6.  Moderate calorie and protein malnutrition 7. HTN, lisinopril on hold  8.  Myasthenia gravis   Plan:  -clinically improving, urine and blood cultures are still pending, agree with antibiotics for now  -I ordered nutrition consult  -continue pain management and supportive care  -remove foley if possible  -If culture negative, no more urinary retention after foley removal, anticipate  he may go home tomorrow  -I will f/u closely after discharge, he needs hycet refill on discharge    Truitt Merle, MD 07/24/2018  10:50 AM

## 2018-07-25 DIAGNOSIS — D72829 Elevated white blood cell count, unspecified: Secondary | ICD-10-CM

## 2018-07-25 LAB — CBC WITH DIFFERENTIAL/PLATELET
ABS IMMATURE GRANULOCYTES: 0.33 10*3/uL — AB (ref 0.00–0.07)
Basophils Absolute: 0 10*3/uL (ref 0.0–0.1)
Basophils Relative: 0 %
Eosinophils Absolute: 0.2 10*3/uL (ref 0.0–0.5)
Eosinophils Relative: 1 %
HCT: 26 % — ABNORMAL LOW (ref 39.0–52.0)
HEMOGLOBIN: 7.9 g/dL — AB (ref 13.0–17.0)
IMMATURE GRANULOCYTES: 2 %
LYMPHS PCT: 9 %
Lymphs Abs: 1.8 10*3/uL (ref 0.7–4.0)
MCH: 28 pg (ref 26.0–34.0)
MCHC: 30.4 g/dL (ref 30.0–36.0)
MCV: 92.2 fL (ref 80.0–100.0)
Monocytes Absolute: 1.6 10*3/uL — ABNORMAL HIGH (ref 0.1–1.0)
Monocytes Relative: 8 %
NEUTROS ABS: 15.4 10*3/uL — AB (ref 1.7–7.7)
NEUTROS PCT: 80 %
Platelets: 249 10*3/uL (ref 150–400)
RBC: 2.82 MIL/uL — ABNORMAL LOW (ref 4.22–5.81)
RDW: 19.2 % — ABNORMAL HIGH (ref 11.5–15.5)
WBC: 19.2 10*3/uL — AB (ref 4.0–10.5)
nRBC: 0 % (ref 0.0–0.2)

## 2018-07-25 LAB — BASIC METABOLIC PANEL
Anion gap: 7 (ref 5–15)
BUN: 20 mg/dL (ref 8–23)
CHLORIDE: 108 mmol/L (ref 98–111)
CO2: 20 mmol/L — ABNORMAL LOW (ref 22–32)
Calcium: 10.3 mg/dL (ref 8.9–10.3)
Creatinine, Ser: 0.76 mg/dL (ref 0.61–1.24)
GFR calc Af Amer: >60 mL/min (ref >60)
GFR calc non Af Amer: >60 mL/min (ref >60)
Glucose, Bld: 129 mg/dL — ABNORMAL HIGH (ref 70–99)
Potassium: 4 mmol/L (ref 3.5–5.1)
Sodium: 135 mmol/L (ref 135–145)

## 2018-07-25 NOTE — Progress Notes (Signed)
PROGRESS NOTE    Philip [name]  ZOX:[id number] DOB: 13-May-1945 DOA: 07/23/2018 PCP: [name], MD    Brief Narrative: Philip [name] a 73 y.o.malewith a history ofesophageal CA at [employer] junction, started carboplatin and taxol w/concurrent XRTFeb 2020 who presented to the cancer center for routine follow up 3/23 and was found to be hypotensive in the setting of having minimal per oral intake due to odynophagia. He was found to have AKIand was referred to the ED for further workup. He reports constantly worsening pain when swallowing which was not significantly improved with carafateover the past week. He denies any syncope, but does feel light headed with any exertion. He has been feeling more weak, mobilizing less with walker. No falls at home.   Assessment & Plan:   Principal Problem:   AKI (acute kidney injury) ([facility]) Active Problems:   Dysphagia   Myasthenia gravis ([facility])   Malignant neoplasm of gastroesophageal junction ([facility])   Hypertension   UTI (urinary tract infection)   Odynophagia   Hyponatremia   Hypercalcemia   Anemia   ARF (acute renal failure) ([facility])   Dehydration   Hypotension   Urinary retention   Renal failure with metabolic acidosis Improving secondary to prerenal azotemia/dehydration. Continue with IV fluids for another 24 hours.    Sepsis secondary to urinary tract infection So far cultures have been negative.  Continue with IV Rocephin as patient still feels dizzy lightheadedness and tired and has some bilateral flank pain.   Hypotension secondary to dehydration and volume depletion Continue with gentle hydration.  Blood pressures have improved since admission.    Hyponatremia Improved with hydration   Dysphagia and odynophagia continue with home medication oropharynx clear with no signs of thrush probably secondary to radiation treatments.   Hypertension Blood pressure 90s over 50s.  Continue to hold ACE inhibitor.   History of  myasthenia gravis Continue with CellCept and Mestinon   Anemia of chronic disease and from chemotherapy Transfuse to keep hemoglobin greater than 7   Urinary retention yesterday Resolved.  Chronic back pain Continue with her home meds    DVT prophylaxis: SCDs) Code Status: Full code Family Communication: none at bedside.  Disposition Plan: pending clinical improvement.   Consultants:   Oncology.    Procedures: none.   Antimicrobials: rocephin since admission.   Subjective: Back pain , tired and dizziness  Wants to take a shower.    Objective: Vitals:   07/24/18 2114 07/25/18 0115 07/25/18 0517 07/25/18 0858  BP: 107/64 (!) 117/56 (!) 115/54 (!) 101/56  Pulse: 94 82 84 93  Resp: 18 17 18 18   Temp: 98.2 F (36.8 C) 98.1 F (36.7 C) 98.8 F (37.1 C) 97.9 F (36.6 C)  TempSrc: Oral Oral Oral Oral  SpO2: 100% 98% 100% 99%  Weight:      Height:        Intake/Output Summary (Last 24 hours) at 07/25/2018 1218 Last data filed at 07/25/2018 0800 Gross per 24 hour  Intake 1919.5 ml  Output 750 ml  Net 1169.5 ml   Filed Weights   07/23/18 1722  Weight: 119 kg    Examination:  General exam: Appears calm and comfortable  Respiratory system: Clear to auscultation. Respiratory effort normal. Cardiovascular system: S1 & S2 heard, RRR. No JVD,No pedal edema. Gastrointestinal system: Abdomen is nondistended, soft and nontender. Bilateral flank pain,  Normal bowel sounds heard. Central nervous system: Alert and oriented. No focal neurological deficits. Extremities: Symmetric 5 x 5 power.  Skin: No rashes, lesions or ulcers Psychiatry: Mood & affect appropriate.     Data Reviewed: I have personally reviewed following labs and imaging studies  CBC: Recent Labs  Lab 07/23/18 0856 07/24/18 0349 07/25/18 0459  WBC 3.5* 2.3* 19.2*  NEUTROABS 2.1 1.5* 15.4*  HGB 10.2* 8.7* 7.9*  HCT 29.6* 26.6* 26.0*  MCV 94.0 98.2 92.2  PLT 225 156 353   Basic Metabolic  Panel: Recent Labs  Lab 07/23/18 0856 07/24/18 0349 07/25/18 0459  NA 129* 133* 135  K 4.5 4.5 4.0  CL 99 103 108  CO2 18* 23 20*  GLUCOSE 107* 95 129*  BUN 44* 36* 20  CREATININE 2.89* 1.19 0.76  CALCIUM 11.5* 10.3 10.3   GFR: Estimated Creatinine Clearance: 111.2 mL/min (by C-G formula based on SCr of 0.76 mg/dL). Liver Function Tests: Recent Labs  Lab 07/23/18 0856  AST 23  ALT 29  ALKPHOS 81  BILITOT 1.2  PROT 6.0*  ALBUMIN 3.4*   No results for input(s): LIPASE, AMYLASE in the last 168 hours. No results for input(s): AMMONIA in the last 168 hours. Coagulation Profile: No results for input(s): INR, PROTIME in the last 168 hours. Cardiac Enzymes: No results for input(s): CKTOTAL, CKMB, CKMBINDEX, TROPONINI in the last 168 hours. BNP (last 3 results) No results for input(s): PROBNP in the last 8760 hours. HbA1C: No results for input(s): HGBA1C in the last 72 hours. CBG: No results for input(s): GLUCAP in the last 168 hours. Lipid Profile: No results for input(s): CHOL, HDL, LDLCALC, TRIG, CHOLHDL, LDLDIRECT in the last 72 hours. Thyroid Function Tests: No results for input(s): TSH, T4TOTAL, FREET4, T3FREE, THYROIDAB in the last 72 hours. Anemia Panel: Recent Labs    07/24/18 0354 07/24/18 0908  VITAMINB12  --  713  FOLATE  --  14.4  FERRITIN  --  586*  TIBC  --  251  IRON  --  77  RETICCTPCT 2.7  --    Sepsis Labs: Recent Labs  Lab 07/23/18 1014 07/23/18 1332  LATICACIDVEN 2.0* 1.9    Recent Results (from the past 240 hour(s))  Blood Culture (routine x 2)     Status: None (Preliminary result)   Collection Time: 07/23/18 10:14 AM  Result Value Ref Range Status   Specimen Description   Final    BLOOD LEFT ANTECUBITAL Performed at [facility] [address] [place], [place] [address]    Special Requests   Final    BOTTLES DRAWN AEROBIC AND ANAEROBIC Blood Culture adequate volume Performed at [facility] [name] [place], [place] [address]    Culture   Final    NO GROWTH 2 DAYS Performed at [facility] [address], [place], [place] [address]    Report Status PENDING  Incomplete  Blood Culture (routine x 2)     Status: None (Preliminary result)   Collection Time: 07/23/18 10:19 AM  Result Value Ref Range Status   Specimen Description   Final    BLOOD RIGHT ANTECUBITAL Performed at [facility], [place] [address], [place], [place] [address]    Special Requests   Final    BOTTLES DRAWN AEROBIC AND ANAEROBIC Blood Culture adequate volume Performed at [facility] [address], [place], [place] [address]    Culture   Final    NO GROWTH 2 DAYS Performed at [facility] [address], [place], [place] [address]  Report Status PENDING  Incomplete  Urine culture     Status: None   Collection Time: 07/23/18  2:59 PM  Result Value Ref Range Status   Specimen Description   Final    URINE, RANDOM Performed at [facility] [address], [place], [place] [address]    Special Requests   Final    NONE Performed at [facility], [place] [address], [place], [place] [address]    Culture   Final    NO GROWTH Performed at [facility], [place] [address], [place], [place] [address]    Report Status 07/24/2018 FINAL  Final         Radiology Studies: US Renal  Result Date: 07/23/2018 CLINICAL DATA:  Urinary retention.  Acute kidney injury. EXAM: RENAL / URINARY TRACT ULTRASOUND COMPLETE COMPARISON:  PET CT 05/29/2018 FINDINGS: Right Kidney: Renal measurements: 11.5 x 6.2 x 5.8 cm = volume: 218 mL. Echogenicity within normal limits. No mass or hydronephrosis visualized. Left Kidney: Renal measurements: 12.7 x 5.9 x 6.9 cm = volume: 269 mL. Echogenicity within normal limits. No mass or hydronephrosis visualized. Bladder: Nondistended and not evaluated. IMPRESSION: Unremarkable renal ultrasound.  No  hydronephrosis. Nondistended bladder. Electronically Signed   By: Keith Rake M.D.   On: 07/23/2018 20:21        Scheduled Meds: . amitriptyline  25 mg Oral QHS  . aspirin EC  81 mg Oral Daily  . atorvastatin  10 mg Oral QHS  . feeding supplement (ENSURE ENLIVE)  237 mL Oral BID BM  . magic mouthwash w/lidocaine  10 mL Oral QID  . mycophenolate  1,000 mg Oral BID  . pantoprazole  40 mg Oral Daily  . pyridostigmine  60 mg Oral TID  . sucralfate  1 g Oral TID WC & HS  . tamsulosin  0.4 mg Oral QPC supper   Continuous Infusions: . sodium chloride 100 mL/hr at 07/25/18 0042  . cefTRIAXone (ROCEPHIN)  IV 1 g (07/24/18 2100)     LOS: 1 day    Time spent: 30 minutes.     Hosie Poisson, MD [facility] Pager [phone]  If 7PM-7AM, please contact night-coverage [email or web address] Password [facility] 07/25/2018, 12:18 PM

## 2018-07-25 NOTE — Progress Notes (Signed)
Philip [name]   DOB:[birth year]   DZ#:[id number]   TMH#:[id number]  Oncology follow up   Subjective: Patient's Foley was removed last night, feels a lot better after that.  He is able to urinate today.  He still quite fatigued, mild dizziness when he stands up.  He is sitting in the chair this morning, but has not been walking much today.   Objective:  Vitals:   07/25/18 0517 07/25/18 0858  BP: (!) 115/54 (!) 101/56  Pulse: 84 93  Resp: 18 18  Temp: 98.8 F (37.1 C) 97.9 F (36.6 C)  SpO2: 100% 99%    Body mass index is 35.58 kg/m.  Intake/Output Summary (Last 24 hours) at 07/25/2018 1346 Last data filed at 07/25/2018 0800 Gross per 24 hour  Intake 1919.5 ml  Output 750 ml  Net 1169.5 ml     Sclerae unicteric  Oropharynx clear  No peripheral adenopathy  Lungs clear -- no rales or rhonchi  Heart regular rate and rhythm  Abdomen benign  MSK no focal spinal tenderness, no peripheral edema  Neuro nonfocal    CBG (last 3)  No results for input(s): GLUCAP in the last 72 hours.   Labs:  Urine Studies No results for input(s): UHGB, CRYS in the last 72 hours.  Invalid input(s): UACOL, UAPR, USPG, UPH, UTP, UGL, UKET, UBIL, UNIT, UROB, ULEU, UEPI, UWBC, Duwayne Heck [place], [place]  Basic Metabolic Panel: Recent Labs  Lab 07/23/18 0856 07/24/18 0349 07/25/18 0459  NA 129* 133* 135  K 4.5 4.5 4.0  CL 99 103 108  CO2 18* 23 20*  GLUCOSE 107* 95 129*  BUN 44* 36* 20  CREATININE 2.89* 1.19 0.76  CALCIUM 11.5* 10.3 10.3   GFR Estimated Creatinine Clearance: 111.2 mL/min (by C-G formula based on SCr of 0.76 mg/dL). Liver Function Tests: Recent Labs  Lab 07/23/18 0856  AST 23  ALT 29  ALKPHOS 81  BILITOT 1.2  PROT 6.0*  ALBUMIN 3.4*   No results for input(s): LIPASE, AMYLASE in the last 168 hours. No results for input(s): AMMONIA in the last 168 hours. Coagulation profile No results for input(s): INR, PROTIME in the last 168 hours.  CBC: Recent Labs  Lab  07/23/18 0856 07/24/18 0349 07/25/18 0459  WBC 3.5* 2.3* 19.2*  NEUTROABS 2.1 1.5* 15.4*  HGB 10.2* 8.7* 7.9*  HCT 29.6* 26.6* 26.0*  MCV 94.0 98.2 92.2  PLT 225 156 249   Cardiac Enzymes: No results for input(s): CKTOTAL, CKMB, CKMBINDEX, TROPONINI in the last 168 hours. BNP: Invalid input(s): POCBNP CBG: No results for input(s): GLUCAP in the last 168 hours. D-Dimer No results for input(s): DDIMER in the last 72 hours. Hgb A1c No results for input(s): HGBA1C in the last 72 hours. Lipid Profile No results for input(s): CHOL, HDL, LDLCALC, TRIG, CHOLHDL, LDLDIRECT in the last 72 hours. Thyroid function studies No results for input(s): TSH, T4TOTAL, T3FREE, THYROIDAB in the last 72 hours.  Invalid input(s): FREET3 Anemia work up [employer]    07/24/18 0354 07/24/18 0908  VITAMINB12  --  713  FOLATE  --  14.4  FERRITIN  --  586*  TIBC  --  251  IRON  --  77  RETICCTPCT 2.7  --    Microbiology Recent Results (from the past 240 hour(s))  Blood Culture (routine x 2)     Status: None (Preliminary result)   Collection Time: 07/23/18 10:14 AM  Result Value Ref Range Status   Specimen Description  Final    BLOOD LEFT ANTECUBITAL Performed at [facility] [address], [place], [place] [address]    Special Requests   Final    BOTTLES DRAWN AEROBIC AND ANAEROBIC Blood Culture adequate volume Performed at [facility] [address], [place], [place] [address]    Culture   Final    NO GROWTH 2 DAYS Performed at [facility] [address], [place], [place] [address]    Report Status PENDING  Incomplete  Blood Culture (routine x 2)     Status: None (Preliminary result)   Collection Time: 11-Aug-2018 10:19 AM  Result Value Ref Range Status   Specimen Description   Final    BLOOD RIGHT ANTECUBITAL Performed at [facility], [place] [address], [place], [place] [address]    Special Requests   Final    BOTTLES DRAWN  AEROBIC AND ANAEROBIC Blood Culture adequate volume Performed at [facility] [address] [place] [address], [place], [place] [address]    Culture   Final    NO GROWTH 2 DAYS Performed at [facility] [address], [place], [place] [address]    Report Status PENDING  Incomplete  Urine culture     Status: None   Collection Time: 08/11/18  2:59 PM  Result Value Ref Range Status   Specimen Description   Final    URINE, RANDOM Performed at [facility] [address], [place], [place] [address]    Special Requests   Final    NONE Performed at [facility], [place] [address], [place], [place] [address]    Culture   Final    NO GROWTH Performed at [facility], [place] [address] [place],  [address]    Report Status 07/24/2018 FINAL  Final      Studies:  US Renal  Result Date: 08-11-2018 CLINICAL DATA:  Urinary retention.  Acute kidney injury. EXAM: RENAL / URINARY TRACT ULTRASOUND COMPLETE COMPARISON:  PET CT 05/29/2018 FINDINGS: Right Kidney: Renal measurements: 11.5 x 6.2 x 5.8 cm = volume: 218 mL. Echogenicity within normal limits. No mass or hydronephrosis visualized. Left Kidney: Renal measurements: 12.7 x 5.9 x 6.9 cm = volume: 269 mL. Echogenicity within normal limits. No mass or hydronephrosis visualized. Bladder: Nondistended and not evaluated. IMPRESSION: Unremarkable renal ultrasound.  No hydronephrosis. Nondistended bladder. Electronically Signed   By: Keith Rake M.D.   On: 11-Aug-2018 20:21    Assessment: 73 y.o. with stage I GE junction adenocarcinoma, status post chemotherapy and radiation, which he completed a week ago, admitted for hypotension and AKI  1.  Hypotension likely secondary to dehydration, resolved 2. AKI, resolved  3. UTI, possible sepsis, on antibiotics 4.  Stage I GE junction adenocarcinoma, completed concurrent chemoradiation 5.  Dysphagia and odynophagia, stable  6.  Moderate calorie  and protein malnutrition 7. HTN, lisinopril on hold  8.  Myasthenia gravis 9.  Anemia second to chemotherapy and hydration 10 leukocytosis     Plan:  -Lab reviewed, he is AKI has resolved, worsening anemia is partially related to hydration -Not sure why he developed leukocytosis, he did not receive any G-CSF or steroid lately, afebrile, cultures has been negative -I encouraged him to get out of bed, and work with physical therapy -anticipate to be discharged home soon, I will f/u in clinic in 1-2 weeks    Truitt Merle, MD 07/25/2018  1:46 PM

## 2018-07-26 LAB — BASIC METABOLIC PANEL
Anion gap: 6 (ref 5–15)
BUN: 9 mg/dL (ref 8–23)
CO2: 23 mmol/L (ref 22–32)
Calcium: 10.4 mg/dL — ABNORMAL HIGH (ref 8.9–10.3)
Chloride: 108 mmol/L (ref 98–111)
Creatinine, Ser: 0.64 mg/dL (ref 0.61–1.24)
GFR calc Af Amer: >60 mL/min (ref >60)
GFR calc non Af Amer: >60 mL/min (ref >60)
Glucose, Bld: 100 mg/dL — ABNORMAL HIGH (ref 70–99)
Potassium: 3.6 mmol/L (ref 3.5–5.1)
Sodium: 137 mmol/L (ref 135–145)

## 2018-07-26 LAB — CBC WITH DIFFERENTIAL/PLATELET
Abs Immature Granulocytes: 0.14 10*3/uL — ABNORMAL HIGH (ref 0.00–0.07)
BASOS PCT: 1 %
Basophils Absolute: 0 10*3/uL (ref 0.0–0.1)
EOS ABS: 0 10*3/uL (ref 0.0–0.5)
Eosinophils Relative: 1 %
HCT: 29.3 % — ABNORMAL LOW (ref 39.0–52.0)
Hemoglobin: 9.3 g/dL — ABNORMAL LOW (ref 13.0–17.0)
Immature Granulocytes: 5 %
Lymphocytes Relative: 14 %
Lymphs Abs: 0.4 10*3/uL — ABNORMAL LOW (ref 0.7–4.0)
MCH: 32.2 pg (ref 26.0–34.0)
MCHC: 31.7 g/dL (ref 30.0–36.0)
MCV: 101.4 fL — ABNORMAL HIGH (ref 80.0–100.0)
Monocytes Absolute: 0.6 10*3/uL (ref 0.1–1.0)
Monocytes Relative: 20 %
Neutro Abs: 1.9 10*3/uL (ref 1.7–7.7)
Neutrophils Relative %: 59 %
PLATELETS: 239 10*3/uL (ref 150–400)
RBC: 2.89 MIL/uL — ABNORMAL LOW (ref 4.22–5.81)
RDW: 18.7 % — ABNORMAL HIGH (ref 11.5–15.5)
WBC: 3.1 10*3/uL — ABNORMAL LOW (ref 4.0–10.5)
nRBC: 0 % (ref 0.0–0.2)

## 2018-07-26 MED ORDER — ENSURE ENLIVE PO LIQD: 237.0000 mL | Freq: Two times a day (BID) | ORAL | 12 refills | Status: DC

## 2018-07-26 MED ORDER — CEPHALEXIN 500 MG PO CAPS: 500.0000 mg | ORAL_CAPSULE | Freq: Two times a day (BID) | ORAL | 0 refills | Status: DC

## 2018-07-26 MED ORDER — TAMSULOSIN HCL 0.4 MG PO CAPS: 0.4000 mg | ORAL_CAPSULE | Freq: Every day | ORAL | 0 refills | Status: DC

## 2018-07-27 ENCOUNTER — Other Ambulatory Visit: Payer: Self-pay

## 2018-07-27 ENCOUNTER — Telehealth: Payer: Self-pay | Admitting: Hematology

## 2018-07-27 ENCOUNTER — Telehealth: Payer: Self-pay

## 2018-07-27 ENCOUNTER — Other Ambulatory Visit: Payer: Self-pay | Admitting: Hematology

## 2018-07-27 DIAGNOSIS — N390 Urinary tract infection, site not specified: Secondary | ICD-10-CM

## 2018-07-27 MED ORDER — HYDROCODONE-ACETAMINOPHEN 7.5-325 MG/15ML PO SOLN: 10.0000 mL | Freq: Four times a day (QID) | ORAL | 0 refills | Status: DC | PRN

## 2018-07-27 MED ORDER — CEPHALEXIN 500 MG PO CAPS: 500.0000 mg | ORAL_CAPSULE | Freq: Two times a day (BID) | ORAL | 0 refills | Status: DC

## 2018-07-27 NOTE — Telephone Encounter (Signed)
-----   Message from Truitt Merle, MD sent at 07/27/2018  8:19 AM EDT ----- He was discharged home yesterday.   Please let pt know that I refilled Hycet to his [facility], and we will see him back the week of 4/6, schedule message sent   Thanks   Krista Blue

## 2018-07-27 NOTE — Discharge Summary (Signed)
Physician Discharge Summary  Philip DEVAN SEG:[id number] DOB: [birth year] DOA: 07/23/2018  PCP: [name], MD  Admit date: 07/23/2018 Discharge date: 07/26/2018  Admitted From:Home.  Disposition:  Home.   Recommendations for Outpatient Follow-up:  1. Follow up with PCP in 1-2 weeks 2. Please obtain BMP/CBC in one week Please follow up with oncology as recommended.   Discharge Condition:stable.  CODE STATUS: full code.  Diet recommendation: Heart Healthy    Brief/Interim Summary:  Loras Grieshop Coferis a 73 y.o.malewith a history ofesophageal CA at [employer] junction, started carboplatin and taxol w/concurrent XRTFeb 2020 who presented to the cancer center for routine follow up 3/23 and was found to be hypotensive in the setting of having minimal per oral intake due to odynophagia. He was found to have AKIand was referred to the ED for further workup. He reports constantly worsening pain when swallowing which was not significantly improved with carafateover the past week. He denies any syncope, but does feel light headed with any exertion. He has been feeling more weak, mobilizing less with walker. No falls at home.  Discharge Diagnoses:  Principal Problem:   AKI (acute kidney injury) ([place]) Active Problems:   Dysphagia   Myasthenia gravis ([place])   Malignant neoplasm of gastroesophageal junction (HCC)   Hypertension   UTI (urinary tract infection)   Odynophagia   Hyponatremia   Hypercalcemia   Anemia   ARF (acute renal failure) ([facility])   Dehydration   Hypotension   Urinary retention  Renal failure with metabolic acidosis Improved secondary to prerenal azotemia/dehydration.     Sepsis secondary to urinary tract infection So far cultures have been negative.  Continue with IV Rocephin for 3 days, discharged on keflex to complete the course.    Hypotension secondary to dehydration and volume depletion Continue with gentle hydration.  Blood pressures have improved since  admission.    Hyponatremia Improved with hydration   Dysphagia and odynophagia continue with home medication oropharynx clear with no signs of thrush probably secondary to radiation treatments.   Hypertension Hold ace inhibitor for a week. Recommend to follow up with PCP before starting the BP meds.    History of myasthenia gravis Continue with CellCept and Mestinon   Anemia of chronic disease and from chemotherapy Follow up with CBC in one week.    Urinary retention yesterday Resolved.  Chronic back pain Continue with her home meds   Discharge Instructions  Discharge Instructions    Diet - low sodium heart healthy   Complete by:  As directed    Discharge instructions   Complete by:  As directed    Please follow up with PCP in one week.  Please follow up with oncology as recommended.     Allergies as of 07/26/2018   No Known Allergies     Medication List    STOP taking these medications   lisinopril 20 MG tablet Commonly known as:  PRINIVIL,ZESTRIL     TAKE these medications   ALBUTEROL IN Inhale 2 puffs into the lungs every 4 (four) hours as needed (shortness of breath or wheezing).   amitriptyline 25 MG tablet Commonly known as:  ELAVIL Take 25 mg by mouth daily.   aspirin EC 81 MG tablet Take 81 mg by mouth daily.   atorvastatin 10 MG tablet Commonly known as:  LIPITOR Take 10 mg by mouth at bedtime.   cephALEXin 500 MG capsule Commonly known as:  Keflex Take 1 capsule (500 mg total) by mouth 2 (two)  times daily.   Esomeprazole Magnesium 20 MG Tbec Take 40 mg by mouth daily at 12 noon.   feeding supplement (ENSURE ENLIVE) Liqd Take 237 mLs by mouth 2 (two) times daily between meals.   magic mouthwash w/lidocaine Soln Take 5 mLs by mouth every 4 (four) hours as needed for mouth pain.   multivitamin tablet Take 1 tablet by mouth daily.   mycophenolate 500 MG tablet Commonly known as:  CELLCEPT Take 2 tablets (1,000 mg  total) by mouth 2 (two) times daily.   ondansetron 8 MG tablet Commonly known as:  Zofran Take 1 tablet (8 mg total) by mouth 2 (two) times daily as needed for refractory nausea / vomiting. Start on day 3 after chemo.   prochlorperazine 10 MG tablet Commonly known as:  COMPAZINE Take 1 tablet (10 mg total) by mouth every 6 (six) hours as needed (Nausea or vomiting).   pyridostigmine 60 MG tablet Commonly known as:  MESTINON Take 0.5-1 tablets (30-60 mg total) by mouth 3 (three) times daily. What changed:  how much to take   sucralfate 1 g tablet Commonly known as:  CARAFATE TAKE ONE TABLET BY MOUTH FOUR TIMES A DAY WITH MEALS AND AT BEDTIME AS DIRECTED What changed:  See the new instructions.   tamsulosin 0.4 MG Caps capsule Commonly known as:  FLOMAX Take 1 capsule (0.4 mg total) by mouth daily after supper.   traMADol 50 MG tablet Commonly known as:  ULTRAM Take 50 mg by mouth every 6 (six) hours as needed for pain.      Follow-up Information    [name], MD. Schedule an appointment as soon as possible for a visit in 1 week(s).   Specialty:  Family Medicine Contact information: [place] [address] [phone]          No Known Allergies  Consultations:  Oncology.    Procedures/Studies: US Renal  Result Date: 07/23/2018 CLINICAL DATA:  Urinary retention.  Acute kidney injury. EXAM: RENAL / URINARY TRACT ULTRASOUND COMPLETE COMPARISON:  PET CT 05/29/2018 FINDINGS: Right Kidney: Renal measurements: 11.5 x 6.2 x 5.8 cm = volume: 218 mL. Echogenicity within normal limits. No mass or hydronephrosis visualized. Left Kidney: Renal measurements: 12.7 x 5.9 x 6.9 cm = volume: 269 mL. Echogenicity within normal limits. No mass or hydronephrosis visualized. Bladder: Nondistended and not evaluated. IMPRESSION: Unremarkable renal ultrasound.  No hydronephrosis. Nondistended bladder. Electronically Signed   By: Keith Rake M.D.   On:  07/23/2018 20:21   Dg Chest Port 1 View  Result Date: 07/23/2018 CLINICAL DATA:  Hypotension. Denies chest complaints. History of esophageal cancer. EXAM: PORTABLE CHEST 1 VIEW COMPARISON:  PET-CT dated May 29, 2018. FINDINGS: The heart size and mediastinal contours are within normal limits. Both lungs are clear. The visualized skeletal structures are unremarkable. IMPRESSION: No active disease. Electronically Signed   By: Titus Dubin M.D.   On: 07/23/2018 11:00      Subjective: No chest pain or sob.   Discharge Exam: Vitals:   07/26/18 0454 07/26/18 1407  BP: 117/76 138/75  Pulse: 94 (!) 101  Resp: 17 19  Temp: 98 F (36.7 C) 98.5 F (36.9 C)  SpO2: 97% 99%   Vitals:   07/25/18 1408 07/25/18 2203 07/26/18 0454 07/26/18 1407  BP: 110/61 121/67 117/76 138/75  Pulse: (!) 109 90 94 (!) 101  Resp:  18 17 19   Temp:  98.2 F (36.8 C) 98 F (36.7 C) 98.5 F (36.9  C)  TempSrc:  Oral Oral Oral  SpO2:  97% 97% 99%  Weight:      Height:        General: Pt is alert, awake, not in acute distress Cardiovascular: RRR, S1/S2 +, no rubs, no gallops Respiratory: CTA bilaterally, no wheezing, no rhonchi Abdominal: Soft, NT, ND, bowel sounds + Extremities: no edema, no cyanosis    The results of significant diagnostics from this hospitalization (including imaging, microbiology, ancillary and laboratory) are listed below for reference.     Microbiology: Recent Results (from the past 240 hour(s))  Blood Culture (routine x 2)     Status: None (Preliminary result)   Collection Time: 07/23/18 10:14 AM  Result Value Ref Range Status   Specimen Description   Final    BLOOD LEFT ANTECUBITAL Performed at [facility] [address], [place], [place] [address]    Special Requests   Final    BOTTLES DRAWN AEROBIC AND ANAEROBIC Blood Culture adequate volume Performed at [facility] [address] [place], [place] [address]    Culture    Final    NO GROWTH 4 DAYS Performed at [facility], [place] [address], [place], [place] [address]    Report Status PENDING  Incomplete  Blood Culture (routine x 2)     Status: None (Preliminary result)   Collection Time: 07/23/18 10:19 AM  Result Value Ref Range Status   Specimen Description   Final    BLOOD RIGHT ANTECUBITAL Performed at [facility], [place] [address], [place], [place] [address]    Special Requests   Final    BOTTLES DRAWN AEROBIC AND ANAEROBIC Blood Culture adequate volume Performed at [facility] [address], [place], [place] [address]    Culture   Final    NO GROWTH 4 DAYS Performed at [facility], [place] [address] [place], [place] [address]    Report Status PENDING  Incomplete  Urine culture     Status: None   Collection Time: 07/23/18  2:59 PM  Result Value Ref Range Status   Specimen Description   Final    URINE, RANDOM Performed at [facility] [address] [place], [place] [address]    Special Requests   Final    NONE Performed at [facility], [place] [address], [place], [place] [address]    Culture   Final    NO GROWTH Performed at [facility], [place] [address] [place],  [address]    Report Status 07/24/2018 FINAL  Final     Labs: BNP (last 3 results) No results for input(s): BNP in the last 8760 hours. Basic Metabolic Panel: Recent Labs  Lab 07/23/18 0856 07/24/18 0349 07/25/18 0459 07/26/18 0856  NA 129* 133* 135 137  K 4.5 4.5 4.0 3.6  CL 99 103 108 108  CO2 18* 23 20* 23  GLUCOSE 107* 95 129* 100*  BUN 44* 36* 20 9  CREATININE 2.89* 1.19 0.76 0.64  CALCIUM 11.5* 10.3 10.3 10.4*   Liver Function Tests: Recent Labs  Lab 07/23/18 0856  AST 23  ALT 29  ALKPHOS 81  BILITOT 1.2  PROT 6.0*  ALBUMIN 3.4*   No results for input(s): LIPASE, AMYLASE in the last 168 hours. No results for input(s): AMMONIA in the last 168  hours. CBC: Recent Labs  Lab 07/23/18 0856 07/24/18 0349 07/25/18 0459 07/26/18 0856  WBC 3.5* 2.3*  19.2* 3.1*  NEUTROABS 2.1 1.5* 15.4* 1.9  HGB 10.2* 8.7* 7.9* 9.3*  HCT 29.6* 26.6* 26.0* 29.3*  MCV 94.0 98.2 92.2 101.4*  PLT 225 156 249 239   Cardiac Enzymes: No results for input(s): CKTOTAL, CKMB, CKMBINDEX, TROPONINI in the last 168 hours. BNP: Invalid input(s): POCBNP CBG: No results for input(s): GLUCAP in the last 168 hours. D-Dimer No results for input(s): DDIMER in the last 72 hours. Hgb A1c No results for input(s): HGBA1C in the last 72 hours. Lipid Profile No results for input(s): CHOL, HDL, LDLCALC, TRIG, CHOLHDL, LDLDIRECT in the last 72 hours. Thyroid function studies No results for input(s): TSH, T4TOTAL, T3FREE, THYROIDAB in the last 72 hours.  Invalid input(s): FREET3 Anemia work up No results for input(s): VITAMINB12, FOLATE, FERRITIN, TIBC, IRON, RETICCTPCT in the last 72 hours. Urinalysis    Component Value Date/Time   COLORURINE YELLOW 07/23/2018 1459   APPEARANCEUR CLOUDY (A) 07/23/2018 1459   LABSPEC 1.015 07/23/2018 1459   PHURINE 5.0 07/23/2018 1459   GLUCOSEU NEGATIVE 07/23/2018 1459   HGBUR LARGE (A) 07/23/2018 1459   BILIRUBINUR NEGATIVE 07/23/2018 1459   KETONESUR NEGATIVE 07/23/2018 1459   PROTEINUR NEGATIVE 07/23/2018 1459   NITRITE NEGATIVE 07/23/2018 1459   LEUKOCYTESUR SMALL (A) 07/23/2018 1459   Sepsis Labs Invalid input(s): PROCALCITONIN,  WBC,  LACTICIDVEN Microbiology Recent Results (from the past 240 hour(s))  Blood Culture (routine x 2)     Status: None (Preliminary result)   Collection Time: 07/23/18 10:14 AM  Result Value Ref Range Status   Specimen Description   Final    BLOOD LEFT ANTECUBITAL Performed at [facility], [place] [address], [place], [place] [address]    Special Requests   Final    BOTTLES DRAWN AEROBIC AND ANAEROBIC Blood Culture adequate volume Performed at [facility] [address] [place], [place] [address]    Culture   Final    NO GROWTH 4 DAYS Performed at [facility], [place] [address] [place], [place] [address]    Report Status PENDING  Incomplete  Blood Culture (routine x 2)     Status: None (Preliminary result)   Collection Time: 07/23/18 10:19 AM  Result Value Ref Range Status   Specimen Description   Final    BLOOD RIGHT ANTECUBITAL Performed at [facility], [place] [address], [place], [place] [address]    Special Requests   Final    BOTTLES DRAWN AEROBIC AND ANAEROBIC Blood Culture adequate volume Performed at [facility] [address], [place], [place] [address]    Culture   Final    NO GROWTH 4 DAYS Performed at [facility], [place] [address] [place], [place] [address]    Report Status PENDING  Incomplete  Urine culture     Status: None   Collection Time: 07/23/18  2:59 PM  Result Value Ref Range Status   Specimen Description   Final    URINE, RANDOM Performed at [facility] [address], [place], [place] [address]    Special Requests   Final    NONE Performed at [facility], [place] [address] [place], [place] [address]    Culture   Final    NO GROWTH Performed at [facility], [place] [address] [place], [place] [address]    Report Status 07/24/2018 FINAL  Final     Time coordinating discharge: Over 32 minutes  SIGNED:  Hosie Poisson, MD  [facility] 07/27/2018, 9:17 AM Pager   If 7PM-7AM, please contact night-coverage [email or web address] Password [email or web address]

## 2018-07-27 NOTE — Telephone Encounter (Signed)
Scheduled apt per 3/27 sch message - unable to reach patient or leave message. Letter sent in the mail.

## 2018-07-27 NOTE — Telephone Encounter (Signed)
Spoke with patient per Dr. Burr Medico explained she has sent Hycet to [facility], he also is requesting his antibiotic be sent which we will take care of.  Patient states while in the hospital had a problem with the IV and his arm is a little swollen.  Instructed him to use warm compresses several times a day for the next 3 days and let us know if swelling does not resolve or worsens.  He verbalized an understanding.

## 2018-07-28 LAB — CULTURE, BLOOD (ROUTINE X 2)
Culture: NO GROWTH
Culture: NO GROWTH
Special Requests: ADEQUATE
Special Requests: ADEQUATE

## 2018-08-08 ENCOUNTER — Other Ambulatory Visit: Payer: Self-pay

## 2018-08-08 ENCOUNTER — Encounter: Payer: Self-pay | Admitting: Hematology

## 2018-08-08 ENCOUNTER — Inpatient Hospital Stay (HOSPITAL_BASED_OUTPATIENT_CLINIC_OR_DEPARTMENT_OTHER): Payer: Medicare Other | Admitting: Hematology

## 2018-08-08 ENCOUNTER — Inpatient Hospital Stay: Payer: Medicare Other | Attending: Nurse Practitioner

## 2018-08-08 ENCOUNTER — Telehealth: Payer: Self-pay | Admitting: Hematology

## 2018-08-08 ENCOUNTER — Other Ambulatory Visit: Payer: Self-pay | Admitting: Hematology

## 2018-08-08 ENCOUNTER — Inpatient Hospital Stay: Payer: Medicare Other

## 2018-08-08 VITALS — BP 87/46 | HR 97 | Temp 98.6°F | Resp 16 | Ht 72.0 in | Wt 264.5 lb

## 2018-08-08 DIAGNOSIS — E46 Unspecified protein-calorie malnutrition: Secondary | ICD-10-CM | POA: Insufficient documentation

## 2018-08-08 DIAGNOSIS — N179 Acute kidney failure, unspecified: Secondary | ICD-10-CM | POA: Insufficient documentation

## 2018-08-08 DIAGNOSIS — Z923 Personal history of irradiation: Secondary | ICD-10-CM

## 2018-08-08 DIAGNOSIS — G7000 Myasthenia gravis without (acute) exacerbation: Secondary | ICD-10-CM | POA: Insufficient documentation

## 2018-08-08 DIAGNOSIS — K2270 Barrett's esophagus without dysplasia: Secondary | ICD-10-CM | POA: Insufficient documentation

## 2018-08-08 DIAGNOSIS — R1310 Dysphagia, unspecified: Secondary | ICD-10-CM | POA: Insufficient documentation

## 2018-08-08 DIAGNOSIS — C160 Malignant neoplasm of cardia: Secondary | ICD-10-CM | POA: Diagnosis present

## 2018-08-08 DIAGNOSIS — E210 Primary hyperparathyroidism: Secondary | ICD-10-CM | POA: Diagnosis not present

## 2018-08-08 DIAGNOSIS — I959 Hypotension, unspecified: Secondary | ICD-10-CM

## 2018-08-08 DIAGNOSIS — Z79899 Other long term (current) drug therapy: Secondary | ICD-10-CM

## 2018-08-08 DIAGNOSIS — E860 Dehydration: Secondary | ICD-10-CM | POA: Diagnosis not present

## 2018-08-08 DIAGNOSIS — R634 Abnormal weight loss: Secondary | ICD-10-CM | POA: Insufficient documentation

## 2018-08-08 DIAGNOSIS — N390 Urinary tract infection, site not specified: Secondary | ICD-10-CM

## 2018-08-08 LAB — CBC WITH DIFFERENTIAL (CANCER CENTER ONLY)
Abs Immature Granulocytes: 0.04 10*3/uL (ref 0.00–0.07)
Basophils Absolute: 0.1 10*3/uL (ref 0.0–0.1)
Basophils Relative: 1 %
Eosinophils Absolute: 0.8 10*3/uL — ABNORMAL HIGH (ref 0.0–0.5)
Eosinophils Relative: 11 %
HCT: 28.1 % — ABNORMAL LOW (ref 39.0–52.0)
Hemoglobin: 9.2 g/dL — ABNORMAL LOW (ref 13.0–17.0)
Immature Granulocytes: 1 %
Lymphocytes Relative: 7 %
Lymphs Abs: 0.5 10*3/uL — ABNORMAL LOW (ref 0.7–4.0)
MCH: 33.2 pg (ref 26.0–34.0)
MCHC: 32.7 g/dL (ref 30.0–36.0)
MCV: 101.4 fL — ABNORMAL HIGH (ref 80.0–100.0)
Monocytes Absolute: 1.2 10*3/uL — ABNORMAL HIGH (ref 0.1–1.0)
Monocytes Relative: 16 %
Neutro Abs: 4.8 10*3/uL (ref 1.7–7.7)
Neutrophils Relative %: 64 %
Platelet Count: 261 10*3/uL (ref 150–400)
RBC: 2.77 MIL/uL — ABNORMAL LOW (ref 4.22–5.81)
RDW: 18.2 % — ABNORMAL HIGH (ref 11.5–15.5)
WBC Count: 7.4 10*3/uL (ref 4.0–10.5)
nRBC: 0 % (ref 0.0–0.2)

## 2018-08-08 LAB — COMPREHENSIVE METABOLIC PANEL
ALT: 16 U/L (ref 0–44)
AST: 24 U/L (ref 15–41)
Albumin: 3.5 g/dL (ref 3.5–5.0)
Alkaline Phosphatase: 97 U/L (ref 38–126)
Anion gap: 8 (ref 5–15)
BUN: 16 mg/dL (ref 8–23)
CO2: 23 mmol/L (ref 22–32)
Calcium: 10.6 mg/dL — ABNORMAL HIGH (ref 8.9–10.3)
Chloride: 104 mmol/L (ref 98–111)
Creatinine, Ser: 1.63 mg/dL — ABNORMAL HIGH (ref 0.61–1.24)
GFR calc Af Amer: 48 mL/min — ABNORMAL LOW (ref >60)
GFR calc non Af Amer: 41 mL/min — ABNORMAL LOW (ref >60)
Glucose, Bld: 119 mg/dL — ABNORMAL HIGH (ref 70–99)
Potassium: 4 mmol/L (ref 3.5–5.1)
Sodium: 135 mmol/L (ref 135–145)
Total Bilirubin: 0.9 mg/dL (ref 0.3–1.2)
Total Protein: 6 g/dL — ABNORMAL LOW (ref 6.5–8.1)

## 2018-08-08 MED ORDER — SODIUM CHLORIDE 0.9 % IV SOLN
INTRAVENOUS | Status: DC
  Administered 2018-08-08: 16:00:00 via INTRAVENOUS
  Filled 2018-08-08 (×2): qty 250

## 2018-08-08 NOTE — Telephone Encounter (Signed)
Scheduled appt per 4/8 los.  Patient aware of appt date and time.

## 2018-08-08 NOTE — Patient Instructions (Signed)

## 2018-08-08 NOTE — Progress Notes (Signed)
[place]   [phone] [phone]   Clinic Follow up Note   Patient Care Team: [name], MD as PCP - General (Family Medicine) Mansouraty, [name], MD as Consulting Physician (Gastroenterology)  Date of Service:  08/08/2018  CHIEF COMPLAINT: F/u GEJ cancer   SUMMARY OF ONCOLOGIC HISTORY: Oncology History   Cancer Staging Malignant neoplasm of gastroesophageal junction [facility]) Staging form: Esophagus - Adenocarcinoma, AJCC 8th Edition - Clinical stage from 02/26/2018: Stage IIB (cT2, cN0, cM0, G3) - Signed by [name], NP on 02/26/2018      Malignant neoplasm of gastroesophageal junction ([place])   01/22/2018 Initial Biopsy    Diagnosis Surgical [P], GE junction nodule BX - POORLY 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    01/22/2018 Procedure    COLONOSCOPY IMPRESSION - Redundant colon. - The examination was otherwise normal on direct and retroflexion views. - No specimens collected.  UPPER ENDOSCOPY IMPRESSION: - Esophageal mucosal changes secondary to established short-segment Barrett's disease. - Mucosal nodule found in the esophagus. Biopsied. - Erythematous mucosa in the prepyloric region of the stomach. - Submucosal nodule found in the duodenum.     02/01/2018 Initial Diagnosis    Malignant neoplasm of gastroesophageal junction ([place])    02/05/2018 Imaging    CT CAP IMPRESSION: 1. No esophageal primary identified. No findings of metastatic disease in the chest, abdomen, or pelvis. 2.  Aortic Atherosclerosis (ICD10-I70.0).    02/12/2018 Procedure    EUS Impression: - A mass was found in the distal esophagus into the gastroesophageal junction and extending to proximal cardia. A tissue diagnosis was obtained prior to this exam consistent with adenocarcinoma. This was staged at T2N0Mx, because of a loss of interface of the muscularis propria as the distal GE  Junction becomes the cardia and the mass moves into this region, but there are many more regions and images that suggest a T1 lesion into the submucosa.    05/29/2018 Imaging    NM PET Image Initial (PI) Skull Base To Thigh  IMPRESSION: 1. Hypermetabolic focus at the GE junction consistent with know adenocarcinoma. 2. No evidence of metastatic adenopathy in the mediastinum or upper abdomen. 3. No evidence of liver metastasis.  No pulmonary metastasis.    06/06/2018 - 07/10/2018 Chemotherapy    Weeklycarboplatin and Taxol, with concurrent radiationstarting on 06/06/2018-07/10/18    06/06/2018 - 07/16/2018 Radiation Therapy    Concurrent chemoRT with Dr. Harvel Ricks on 06/06/2018-07/16/18      CURRENT THERAPY:  Surveillance   INTERVAL HISTORY:  Philip [name] is here for a follow up of GEJ cancer. He presents to the clinic today by himself. He notes he is doing better since hospitalization. He is able to eat anything now and tolerate. He uses hydrocodone 2-3 times day to help his swallowing. He notes he takes 1 gallon of liquid a day.   He notes he recently started Flomax in hospital given craterization.    REVIEW OF SYSTEMS:   Constitutional: Denies fevers, chills or abnormal weight loss Eyes: Denies blurriness of vision Ears, nose, mouth, throat, and face: Denies mucositis or sore throat (+) Improved odynophagia Respiratory: Denies cough, dyspnea or wheezes Cardiovascular: Denies palpitation, chest discomfort or lower extremity swelling Gastrointestinal:  Denies nausea, heartburn or change in bowel habits Skin: Denies abnormal skin rashes Lymphatics: Denies new lymphadenopathy or easy bruising Neurological:Denies numbness, tingling or new weaknesses Behavioral/Psych: Mood is stable, no new changes  All other systems were  reviewed with the patient and are negative.  MEDICAL HISTORY:  Past Medical History:  Diagnosis Date  . Abdominal hernia   . Anemia   . Asthma   . Barrett's  esophagus   . Cancer (HCC)    Esophagel cancer  . Cataract    Bil surgery  . Cough    over 2 weeks  . Fluid retention in legs   . GERD (gastroesophageal reflux disease)   . H/O hiatal hernia   . Hypertension   . Myasthenia gravis ([place])   . Sleep apnea    don't use c-pap at home/ tried for several years  . SOB (shortness of breath)     SURGICAL HISTORY: Past Surgical History:  Procedure Laterality Date  . BACK SURGERY     5th lumbar  . BALLOON DILATION N/A 10/19/2016   Procedure: BALLOON DILATION;  Surgeon: Ladene Artist, MD;  Location: [facility];  Service: Endoscopy;  Laterality: N/A;  . COLONOSCOPY W/ POLYPECTOMY    . ESOPHAGOGASTRODUODENOSCOPY N/A 10/19/2016   Procedure: ESOPHAGOGASTRODUODENOSCOPY (EGD);  Surgeon: Ladene Artist, MD;  Location: [facility];  Service: Endoscopy;  Laterality: N/A;  . ESOPHAGOGASTRODUODENOSCOPY (EGD) WITH PROPOFOL N/A 02/12/2018   Procedure: ESOPHAGOGASTRODUODENOSCOPY (EGD) WITH PROPOFOL;  Surgeon: [name], MD;  Location: [place];  Service: Gastroenterology;  Laterality: N/A;  . EUS N/A 02/12/2018   Procedure: UPPER ENDOSCOPIC ULTRASOUND (EUS) RADIAL;  Surgeon: [name], MD;  Location: [place];  Service: Gastroenterology;  Laterality: N/A;  . EYE SURGERY Bilateral    cataract  . LUMBAR LAMINECTOMY     1980's    I have reviewed the social history and family history with the patient and they are unchanged from previous note.  ALLERGIES:  has No Known Allergies.  MEDICATIONS:  Current Outpatient Medications  Medication Sig Dispense Refill  . ALBUTEROL IN Inhale 2 puffs into the lungs every 4 (four) hours as needed (shortness of breath or wheezing).     [name] amitriptyline (ELAVIL) 25 MG tablet Take 25 mg by mouth daily.     [name] atorvastatin (LIPITOR) 10 MG tablet Take 10 mg by mouth at bedtime.     . Esomeprazole Magnesium 20 MG TBEC Take 40 mg by mouth daily at 12 noon.     . magic mouthwash  w/lidocaine SOLN Take 5 mLs by mouth every 4 (four) hours as needed for mouth pain. 500 mL 1  . Multiple Vitamin (MULTIVITAMIN) tablet Take 1 tablet by mouth daily.    . mycophenolate (CELLCEPT) 500 MG tablet Take 2 tablets (1,000 mg total) by mouth 2 (two) times daily. 120 tablet 12  . ondansetron (ZOFRAN) 8 MG tablet Take 1 tablet (8 mg total) by mouth 2 (two) times daily as needed for refractory nausea / vomiting. Start on day 3 after chemo. 30 tablet 1  . prochlorperazine (COMPAZINE) 10 MG tablet Take 1 tablet (10 mg total) by mouth every 6 (six) hours as needed (Nausea or vomiting). 30 tablet 1  . sucralfate (CARAFATE) 1 g tablet Take 1 tablet (1 g total) by mouth 4 (four) times daily -  with meals and at bedtime. 120 tablet 2  . tamsulosin (FLOMAX) 0.4 MG CAPS capsule Take 1 capsule (0.4 mg total) by mouth daily after supper. 30 capsule 0  . aspirin EC 81 MG tablet Take 81 mg by mouth daily.    . feeding supplement, ENSURE ENLIVE, (ENSURE ENLIVE) LIQD Take 237 mLs by mouth 2 (two) times daily between meals. (Patient  not taking: Reported on 08/08/2018) 237 mL 12  . HYDROcodone-acetaminophen (HYCET) 7.5-325 mg/15 ml solution Take 10 mLs by mouth every 6 (six) hours as needed for moderate pain. (Patient not taking: Reported on 08/08/2018) 240 mL 0  . pyridostigmine (MESTINON) 60 MG tablet Take 0.5-1 tablets (30-60 mg total) by mouth 3 (three) times daily. (Patient not taking: Reported on 08/08/2018) 90 tablet 12  . traMADol (ULTRAM) 50 MG tablet Take 50 mg by mouth every 6 (six) hours as needed for pain.     No current facility-administered medications for this visit.    Facility-Administered Medications Ordered in Other Visits  Medication Dose Route Frequency Provider Last Rate Last Dose  . 0.9 %  sodium chloride infusion   Intravenous Continuous Truitt Merle, MD   Stopped at 08/08/18 1710    PHYSICAL EXAMINATION: ECOG PERFORMANCE STATUS: 2 - Symptomatic, <50% confined to bed  Vitals:   08/08/18  1444 08/08/18 1449  BP: (!) 85/48 (!) 87/46  Pulse: 97   Resp: 16   Temp: 98.6 F (37 C)   SpO2: 98%    Filed Weights   08/08/18 1444  Weight: 264 lb 8 oz (120 kg)    GENERAL:alert, no distress and comfortable SKIN: skin color, texture, turgor are normal, no rashes or significant lesions EYES: normal, Conjunctiva are pink and non-injected, sclera clear OROPHARYNX:no exudate, no erythema and lips, buccal mucosa, and tongue normal  NECK: supple, thyroid normal size, non-tender, without nodularity LYMPH:  no palpable lymphadenopathy in the cervical, axillary or inguinal LUNGS: clear to auscultation and percussion with normal breathing effort HEART: regular rate & rhythm and no murmurs and no lower extremity edema ABDOMEN:abdomen soft, non-tender and normal bowel sounds Musculoskeletal:no cyanosis of digits and no clubbing  NEURO: alert & oriented x 3 with fluent speech, no focal motor/sensory deficits  LABORATORY DATA:  I have reviewed the data as listed CBC Latest Ref Rng & Units 08/08/2018 07/26/2018 07/25/2018  WBC 4.0 - 10.5 K/uL 7.4 3.1(L) 19.2(H)  Hemoglobin 13.0 - 17.0 g/dL 9.2(L) 9.3(L) 7.9(L)  Hematocrit 39.0 - 52.0 % 28.1(L) 29.3(L) 26.0(L)  Platelets 150 - 400 K/uL 261 239 249     CMP Latest Ref Rng & Units 08/08/2018 07/26/2018 07/25/2018  Glucose 70 - 99 mg/dL 119(H) 100(H) 129(H)  BUN 8 - 23 mg/dL 16 9 20   Creatinine 0.61 - 1.24 mg/dL 1.63(H) 0.64 0.76  Sodium 135 - 145 mmol/L 135 137 135  Potassium 3.5 - 5.1 mmol/L 4.0 3.6 4.0  Chloride 98 - 111 mmol/L 104 108 108  CO2 22 - 32 mmol/L 23 23 20(L)  Calcium 8.9 - 10.3 mg/dL 10.6(H) 10.4(H) 10.3  Total Protein 6.5 - 8.1 g/dL 6.0(L) - -  Total Bilirubin 0.3 - 1.2 mg/dL 0.9 - -  Alkaline Phos 38 - 126 U/L 97 - -  AST 15 - 41 U/L 24 - -  ALT 0 - 44 U/L 16 - -      RADIOGRAPHIC STUDIES: I have personally reviewed the radiological images as listed and agreed with the findings in the report. No results found.    ASSESSMENT & PLAN:  Philip [name] is a 73 y.o. male with   1. Hypotension and AKI -Patient completed radiation last month. He was previously found to be hypotensive with systolic blood pressure in 70s with AKI and was hospitalized for this 2 weeks ago.  -Although his food and liquid intake has improved he still has hypotension and labs shows Cr at 1.63 today (  08/08/18).  -This is likely related to dehydration.  He is afebrile, normal white count today, I have very low suspicion this is related to infection or sepsis. Will do UA and urine culture today (he had UTI 2 weeks ago) -I advised him to reduce Flomax to every other day, add salt to his diet. I will give IV Fluids today and later this week.  -F/u in 1 week to monitor closely.   2. 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 -Due to his age and comorbidities, he is not a good candidate for surgery.He completed concurrent chemoRT with CT.  -Labs reviewed, CBC shows Hg 9.2, CMP shows BG 119, Cr 1.63, Ca 10/6, protein 6.  -He is clinically still dehydrated and hypotensive, weight has improved.  3. Dysphagia and odynophagia -Hehas difficultyswallowing and sulcrafatehasnot improve his pain -Odynophagia has improved. Will continue liquid hydrocodone.   4. Weight loss and malnutrition -He has lost a significant weight since he started chemoradiation, secondary to dysphagia and odynophagia -I previously encouraged him to increase oral nutrition supplement, especially boost -He has better diet and has 1 gallon intake of liquid daily. Odynophagia has improved. Will continue liquid hydrocodone.  -He has been abel to gain weight recently. Protein at 6 today (08/08/18)  5.Chronic backpain  -Takestramadol as needed -Continue to F/uwith pain specialist  6. HTN -was onlisinopril. Due to hypotension I  previously instructed him to hold lisinopril.  -F/u with PCP -BP dropped to 87/46 today (08/06/18). I strongly encouraged him to monitor at home and add salt back in diet.  -he started Flomax during last hospitalization 2 weeks ago. I instructed him to reduce to every other day.  -Once his dehydration is resolved will prescribe him medication to    7. Social issues -He lives alone in [place] -He is able to take care of himself at home  8.Myasthenia gravis -On cellcept 1000mg BIDfor longer term immunosuppression -Stable  -He is at high risk for infections due to chemo  9. Hypercalcemia,primary hyperparathyroidism -Ipreviouslyrecommend low-dose calcium and vitamin D supplement,he will take multivitaminswhich contains low dose calcium -Labs supporting primary hyperparathyroidism -Ca at 10.6 today (08/06/18).    Plan -Labs reviewed, he is again dehydrated and hypotensive.  -Given IV Fluids today with NS 1L over 2 hours  -he will reduce Flomax to every other day and increase water intake, and add salt to diet.  -Lab, and IVF 2 hrs on 4/10 and in one week -F/u in one week  -I refilled Hycet    No problem-specific Assessment & Plan notes found for this encounter.   Orders Placed This Encounter  Procedures  . Urine Culture    Standing Status:   Future    Standing Expiration Date:   08/08/2019  . Comprehensive metabolic panel  . Urinalysis, Complete w Microscopic    Standing Status:   Future    Standing Expiration Date:   08/08/2019   All questions were answered. The patient knows to call the clinic with any problems, questions or concerns. No barriers to learning was detected. I spent 20 minutes counseling the patient face to face. The total time spent in the appointment was 25 minutes and more than 50% was on counseling and review of test results     Truitt Merle, MD 08/08/2018   I, Joslyn Devon, am acting as scribe for Truitt Merle, MD.   I have reviewed the above  documentation for accuracy and completeness, and I agree with the above.

## 2018-08-09 ENCOUNTER — Inpatient Hospital Stay: Payer: Medicare Other

## 2018-08-09 ENCOUNTER — Inpatient Hospital Stay: Payer: Medicare Other | Admitting: Hematology

## 2018-08-09 LAB — CEA (IN HOUSE-CHCC): CEA (CHCC-In House): 4.04 ng/mL (ref 0.00–5.00)

## 2018-08-09 MED ORDER — HYDROCODONE-ACETAMINOPHEN 7.5-325 MG/15ML PO SOLN: 10.0000 mL | Freq: Four times a day (QID) | ORAL | 0 refills | Status: DC | PRN

## 2018-08-09 NOTE — Addendum Note (Signed)
Addended by: Truitt Merle on: 08/09/2018 04:12 PM   Modules accepted: Orders

## 2018-08-10 ENCOUNTER — Inpatient Hospital Stay: Payer: Medicare Other

## 2018-08-13 ENCOUNTER — Telehealth: Payer: Self-pay

## 2018-08-13 NOTE — Progress Notes (Signed)
[place]   [phone] [phone]   Clinic Follow up Note   Patient Care Team: [name], MD as PCP - General (Family Medicine) Mansouraty, [name], MD as Consulting Physician (Gastroenterology)  Date of Service:  08/15/2018  CHIEF COMPLAINT: F/u GEJ cancer   SUMMARY OF ONCOLOGIC HISTORY: Oncology History   Cancer Staging Malignant neoplasm of gastroesophageal junction ([place]) Staging form: Esophagus - Adenocarcinoma, AJCC 8th Edition - Clinical stage from 02/26/2018: Stage IIB (cT2, cN0, cM0, G3) - Signed by [name], NP on 02/26/2018      Malignant neoplasm of gastroesophageal junction ([place])   01/22/2018 Initial Biopsy    Diagnosis Surgical [P], GE junction nodule BX - POORLY 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    01/22/2018 Procedure    COLONOSCOPY IMPRESSION - Redundant colon. - The examination was otherwise normal on direct and retroflexion views. - No specimens collected.  UPPER ENDOSCOPY IMPRESSION: - Esophageal mucosal changes secondary to established short-segment Barrett's disease. - Mucosal nodule found in the esophagus. Biopsied. - Erythematous mucosa in the prepyloric region of the stomach. - Submucosal nodule found in the duodenum.     02/01/2018 Initial Diagnosis    Malignant neoplasm of gastroesophageal junction ([place])    02/05/2018 Imaging    CT CAP IMPRESSION: 1. No esophageal primary identified. No findings of metastatic disease in the chest, abdomen, or pelvis. 2.  Aortic Atherosclerosis (ICD10-I70.0).    02/12/2018 Procedure    EUS Impression: - A mass was found in the distal esophagus into the gastroesophageal junction and extending to proximal cardia. A tissue diagnosis was obtained prior to this exam consistent with adenocarcinoma. This was staged at T2N0Mx, because of a loss of interface of the muscularis propria as the distal GE  Junction becomes the cardia and the mass moves into this region, but there are many more regions and images that suggest a T1 lesion into the submucosa.    05/29/2018 Imaging    NM PET Image Initial (PI) Skull Base To Thigh  IMPRESSION: 1. Hypermetabolic focus at the GE junction consistent with know adenocarcinoma. 2. No evidence of metastatic adenopathy in the mediastinum or upper abdomen. 3. No evidence of liver metastasis.  No pulmonary metastasis.    06/06/2018 - 07/10/2018 Chemotherapy    Weeklycarboplatin and Taxol, with concurrent radiationstarting on 06/06/2018-07/10/18    06/06/2018 - 07/16/2018 Radiation Therapy    Concurrent chemoRT with Dr. Harvel Ricks on 06/06/2018-07/16/18      CURRENT THERAPY:  Surveillance   INTERVAL HISTORY:  Philip [name] is here for a follow up of GEJ Cancer. He presents to the clinic today by himself.  He notes he is feeling better. He notes he is eating better and drinking 1 gallon a day of any liquids. He notes his odynophagia still improves. He take 5cc hydrocodone liquid before each meal about 3 times a day and tramadol as needed.  He denies productive cough but cough due to hiatal hernia. He denies dysuria.    REVIEW OF SYSTEMS:   Constitutional: Denies fevers, chills or abnormal weight loss (+) Improved food and drink intake  Eyes: Denies blurriness of vision Ears, nose, mouth, throat, and face: Denies mucositis or sore throat Respiratory: Denies cough, dyspnea or wheezes Cardiovascular: Denies palpitation, chest discomfort or lower extremity swelling Gastrointestinal:  Denies nausea, heartburn or change in bowel habits Skin: Denies abnormal skin rashes Lymphatics: Denies new lymphadenopathy or easy bruising Neurological:Denies numbness, tingling or  new weaknesses Behavioral/Psych: Mood is stable, no new changes  All other systems were reviewed with the patient and are negative.  MEDICAL HISTORY:  Past Medical History:  Diagnosis Date   . Abdominal hernia   . Anemia   . Asthma   . Barrett's esophagus   . Cancer (HCC)    Esophagel cancer  . Cataract    Bil surgery  . Cough    over 2 weeks  . Fluid retention in legs   . GERD (gastroesophageal reflux disease)   . H/O hiatal hernia   . Hypertension   . Myasthenia gravis ([place])   . Sleep apnea    don't use c-pap at home/ tried for several years  . SOB (shortness of breath)     SURGICAL HISTORY: Past Surgical History:  Procedure Laterality Date  . BACK SURGERY     5th lumbar  . BALLOON DILATION N/A 10/19/2016   Procedure: BALLOON DILATION;  Surgeon: Ladene Artist, MD;  Location: [facility];  Service: Endoscopy;  Laterality: N/A;  . COLONOSCOPY W/ POLYPECTOMY    . ESOPHAGOGASTRODUODENOSCOPY N/A 10/19/2016   Procedure: ESOPHAGOGASTRODUODENOSCOPY (EGD);  Surgeon: Ladene Artist, MD;  Location: [facility];  Service: Endoscopy;  Laterality: N/A;  . ESOPHAGOGASTRODUODENOSCOPY (EGD) WITH PROPOFOL N/A 02/12/2018   Procedure: ESOPHAGOGASTRODUODENOSCOPY (EGD) WITH PROPOFOL;  Surgeon: [name], MD;  Location: [facility];  Service: Gastroenterology;  Laterality: N/A;  . EUS N/A 02/12/2018   Procedure: UPPER ENDOSCOPIC ULTRASOUND (EUS) RADIAL;  Surgeon: [name], MD;  Location: [facility];  Service: Gastroenterology;  Laterality: N/A;  . EYE SURGERY Bilateral    cataract  . LUMBAR LAMINECTOMY     1980's    I have reviewed the social history and family history with the patient and they are unchanged from previous note.  ALLERGIES:  has No Known Allergies.  MEDICATIONS:  Current Outpatient Medications  Medication Sig Dispense Refill  . ALBUTEROL IN Inhale 2 puffs into the lungs every 4 (four) hours as needed (shortness of breath or wheezing).     [name] amitriptyline (ELAVIL) 25 MG tablet Take 25 mg by mouth daily.     [name] aspirin EC 81 MG tablet Take 81 mg by mouth daily.    [name] atorvastatin (LIPITOR) 10 MG tablet Take 10 mg by mouth at  bedtime.     . Esomeprazole Magnesium 20 MG TBEC Take 40 mg by mouth daily at 12 noon.     . feeding supplement, ENSURE ENLIVE, (ENSURE ENLIVE) LIQD Take 237 mLs by mouth 2 (two) times daily between meals. (Patient not taking: Reported on 08/08/2018) 237 mL 12  . HYDROcodone-acetaminophen (HYCET) 7.5-325 mg/15 ml solution Take 10 mLs by mouth every 6 (six) hours as needed for moderate pain. 240 mL 0  . magic mouthwash w/lidocaine SOLN Take 5 mLs by mouth every 4 (four) hours as needed for mouth pain. 500 mL 1  . Multiple Vitamin (MULTIVITAMIN) tablet Take 1 tablet by mouth daily.    . mycophenolate (CELLCEPT) 500 MG tablet Take 2 tablets (1,000 mg total) by mouth 2 (two) times daily. 120 tablet 12  . ondansetron (ZOFRAN) 8 MG tablet Take 1 tablet (8 mg total) by mouth 2 (two) times daily as needed for refractory nausea / vomiting. Start on day 3 after chemo. 30 tablet 1  . prochlorperazine (COMPAZINE) 10 MG tablet Take 1 tablet (10 mg total) by mouth every 6 (six) hours as needed (Nausea or vomiting). 30 tablet 1  . pyridostigmine (MESTINON)  60 MG tablet Take 0.5-1 tablets (30-60 mg total) by mouth 3 (three) times daily. (Patient not taking: Reported on 08/08/2018) 90 tablet 12  . sucralfate (CARAFATE) 1 g tablet Take 1 tablet (1 g total) by mouth 4 (four) times daily -  with meals and at bedtime. 120 tablet 2  . tamsulosin (FLOMAX) 0.4 MG CAPS capsule Take 1 capsule (0.4 mg total) by mouth daily after supper. 30 capsule 0  . traMADol (ULTRAM) 50 MG tablet Take 50 mg by mouth every 6 (six) hours as needed for pain.     No current facility-administered medications for this visit.     PHYSICAL EXAMINATION: ECOG PERFORMANCE STATUS: 1 - Symptomatic but completely ambulatory  Vitals with BMI 08/15/2018  Height 6'0"  Weight   BMI   Systolic 245  Diastolic 59  Pulse   Respirations 82    GENERAL:alert, no distress and comfortable SKIN: skin color, texture, turgor are normal, no rashes or  significant lesions EYES: normal, Conjunctiva are pink and non-injected, sclera clear OROPHARYNX:no exudate, no erythema and lips, buccal mucosa, and tongue normal  NECK: supple, thyroid normal size, non-tender, without nodularity LYMPH:  no palpable lymphadenopathy in the cervical, axillary or inguinal LUNGS: clear to auscultation and percussion with normal breathing effort HEART: regular rate & rhythm and no murmurs and no lower extremity edema ABDOMEN:abdomen soft, non-tender and normal bowel sounds Musculoskeletal:no cyanosis of digits and no clubbing  NEURO: alert & oriented x 3 with fluent speech, no focal motor/sensory deficits  LABORATORY DATA:  I have reviewed the data as listed CBC Latest Ref Rng & Units 08/15/2018 08/08/2018 07/26/2018  WBC 4.0 - 10.5 K/uL 8.3 7.4 3.1(L)  Hemoglobin 13.0 - 17.0 g/dL 9.2(L) 9.2(L) 9.3(L)  Hematocrit 39.0 - 52.0 % 29.2(L) 28.1(L) 29.3(L)  Platelets 150 - 400 K/uL 287 261 239     CMP Latest Ref Rng & Units 08/15/2018 08/08/2018 07/26/2018  Glucose 70 - 99 mg/dL 91 119(H) 100(H)  BUN 8 - 23 mg/dL 15 16 9   Creatinine 0.61 - 1.24 mg/dL 1.03 1.63(H) 0.64  Sodium 135 - 145 mmol/L 134(L) 135 137  Potassium 3.5 - 5.1 mmol/L 4.0 4.0 3.6  Chloride 98 - 111 mmol/L 104 104 108  CO2 22 - 32 mmol/L 20(L) 23 23  Calcium 8.9 - 10.3 mg/dL 10.8(H) 10.6(H) 10.4(H)  Total Protein 6.5 - 8.1 g/dL 6.1(L) 6.0(L) -  Total Bilirubin 0.3 - 1.2 mg/dL 0.7 0.9 -  Alkaline Phos 38 - 126 U/L 96 97 -  AST 15 - 41 U/L 18 24 -  ALT 0 - 44 U/L 15 16 -      RADIOGRAPHIC STUDIES: I have personally reviewed the radiological images as listed and agreed with the findings in the report. No results found.   ASSESSMENT & PLAN:  Philip [name] is a 73 y.o. male with   1. 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 -Due to his age and  comorbidities, he is not a good candidate for surgery.He completed concurrent chemoRT with CT.  -Labs reviewed, CBC and CMP WNL except HG 9.2, sodium 134, Ca 10.8, albumin 3.2. He will proceed with IV Fluids today. I encouraged him to contact the clinic if he is not eating or drinking adequately. I advised him to drink 1 gallon of liquids daily. He understands -Will repeat scan in 2 months -F/u in 4 week and will order scan at  that visit.   2. Hypotension andAKI  -Patient completedradiation last month. He was previously found to be hypotensive with systolic blood pressure in 70s with AKI and was hospitalized for this 3 weeks ago.  -Although his food and liquid intake has improved he still had hypotension with AKI last week, received IVF in the clinic  -Cr normalized, BP normal at 105.59 today today (08/15/18) -will give 1L NS today      3.Dysphagia and odynophagia -Hehas difficultyswallowing and sulcrafatehasnot improve his pain -Odynophagia continues to improve. -I discussed with Hydrocodone liquid is narcotics and would reduce use. I recommend he start to ween off it. If he does not have significant pain he should not use it. He understands.   4. Weight loss and malnutrition -He has lost a significant weight since he started chemoradiation, secondary to dysphagia and odynophagia -Ipreviouslyencouraged him to increase oral nutrition supplement, especially boost -He has better diet and has 1 gallon intake of liquid daily. Odynophagia has improved. Will continue liquid hydrocodone.   5.Chronic backpain  -Takestramadol as needed -Continue toF/uwith pain specialist  6. HTN -was onlisinopril. Due to hypotension I previously instructed him to hold lisinopril and add salt to diet -F/u with PCP -He started Flomax during last hospitalization 2 weeks ago. I instructed him to reduce to every other day.  -BP improved to 105/59 today (08/15/18).    7. Social issues -He lives  alone in [place] -He is able to take care of himself at home  8.Myasthenia gravis -Oncellcept 1000mg BIDfor longer term immunosuppression -Stable  -He is at high risk for infections due to prior chemo  9. Hypercalcemia,primary hyperparathyroidism -Ipreviouslyrecommend low-dose calcium and vitamin D supplement,he will take multivitaminswhich contains low dose calcium -Labssupporting primary hyperparathyroidism -Ca at 10.8 today (08/15/18).     Plan -Labs reviewed, His Cr is back to normal today  -will give NS 1L today   -lab and f/u in 4 weeks    No problem-specific Assessment & Plan notes found for this encounter.   No orders of the defined types were placed in this encounter.  All questions were answered. The patient knows to call the clinic with any problems, questions or concerns. No barriers to learning was detected. I spent 15 minutes counseling the patient face to face. The total time spent in the appointment was 20 minutes and more than 50% was on counseling and review of test results     Truitt Merle, MD 08/15/2018   I, Joslyn Devon, am acting as scribe for Truitt Merle, MD.   I have reviewed the above documentation for accuracy and completeness, and I agree with the above.

## 2018-08-13 NOTE — Telephone Encounter (Signed)
Spoke with patient regarding missing his appointments this past Friday for lab and IVF, patient states he didn't know about that appointment, is scheduled for Wednesday will keep this appointment unless he gets to feeling badly he will call back.

## 2018-08-13 NOTE — Telephone Encounter (Signed)
-----   Message from Truitt Merle, MD sent at 08/12/2018  5:11 PM EDT ----- He had no show for his lab and IVF on Friday, please check on him if he is doing OK at him, and if he is willing to come in for IVF and lab on Monday. He does have appointment on Wednesday   Thanks  Krista Blue

## 2018-08-14 NOTE — Progress Notes (Signed)
  Radiation Oncology         [phone] ________________________________  Name: Philip [name] MRN: [id number]  Date: 07/16/2018  DOB: 1946/01/02  End of Treatment Note  Diagnosis:   73 y.o. male with Stage I-IIB, cT1-2N0M0 adenocarcinoma of the distal esophagus/GE junction  Indication for treatment:  Curative       Radiation treatment dates:   06/06/2018 - 07/16/2018  Site/dose:   The esophagus/GE junction received 50 Gy in 25 fractions initially, also using a simultaneous boost technique to 45 Gy. A boost was then given to 6 Gy/5.4 Gy using a SIB technique to yield 56 Gy to the high dose target and 50.4 Gy to the lower dose target.  Beams/energy:   IMRT / 6X Photon  Narrative: The patient tolerated radiation treatment relatively well with concurrent chemotherapy.  He did experience esophagitis over his course of treatment, well-managed with Carafate and Hycet. Despite some weight loss, he was able to maintain a fair appetite and stay hydrated. He also noted increased fatigue.  Plan: The patient has completed radiation treatment. The patient will return to radiation oncology clinic for routine followup in one month. I advised them to call or return sooner if they have any questions or concerns related to their recovery or treatment.  ------------------------------------------------  Jodelle Gross, MD, PhD  This document serves as a record of services personally performed by Kyung Rudd, MD. It was created on his behalf by Rae Lips, a trained medical scribe. The creation of this record is based on the scribe's personal observations and the provider's statements to them. This document has been checked and approved by the attending provider.

## 2018-08-15 ENCOUNTER — Inpatient Hospital Stay: Payer: Medicare Other

## 2018-08-15 ENCOUNTER — Other Ambulatory Visit: Payer: Self-pay

## 2018-08-15 ENCOUNTER — Encounter: Payer: Self-pay | Admitting: Hematology

## 2018-08-15 ENCOUNTER — Inpatient Hospital Stay (HOSPITAL_BASED_OUTPATIENT_CLINIC_OR_DEPARTMENT_OTHER): Payer: Medicare Other | Admitting: Hematology

## 2018-08-15 VITALS — BP 105/59 | Temp 97.6°F | Resp 82

## 2018-08-15 DIAGNOSIS — E46 Unspecified protein-calorie malnutrition: Secondary | ICD-10-CM

## 2018-08-15 DIAGNOSIS — Z923 Personal history of irradiation: Secondary | ICD-10-CM

## 2018-08-15 DIAGNOSIS — C160 Malignant neoplasm of cardia: Secondary | ICD-10-CM

## 2018-08-15 DIAGNOSIS — R634 Abnormal weight loss: Secondary | ICD-10-CM

## 2018-08-15 DIAGNOSIS — K2270 Barrett's esophagus without dysplasia: Secondary | ICD-10-CM

## 2018-08-15 DIAGNOSIS — Z79899 Other long term (current) drug therapy: Secondary | ICD-10-CM

## 2018-08-15 DIAGNOSIS — I959 Hypotension, unspecified: Secondary | ICD-10-CM | POA: Diagnosis not present

## 2018-08-15 DIAGNOSIS — G7000 Myasthenia gravis without (acute) exacerbation: Secondary | ICD-10-CM

## 2018-08-15 DIAGNOSIS — N179 Acute kidney failure, unspecified: Secondary | ICD-10-CM | POA: Diagnosis not present

## 2018-08-15 DIAGNOSIS — E210 Primary hyperparathyroidism: Secondary | ICD-10-CM

## 2018-08-15 DIAGNOSIS — E860 Dehydration: Secondary | ICD-10-CM

## 2018-08-15 DIAGNOSIS — N390 Urinary tract infection, site not specified: Secondary | ICD-10-CM

## 2018-08-15 LAB — URINALYSIS, COMPLETE (UACMP) WITH MICROSCOPIC
Glucose, UA: NEGATIVE mg/dL
Hgb urine dipstick: NEGATIVE
Ketones, ur: NEGATIVE mg/dL
Nitrite: NEGATIVE
Protein, ur: NEGATIVE mg/dL
Specific Gravity, Urine: 1.019 (ref 1.005–1.030)
pH: 5 (ref 5.0–8.0)

## 2018-08-15 LAB — CBC WITH DIFFERENTIAL (CANCER CENTER ONLY)
Abs Immature Granulocytes: 0.07 10*3/uL (ref 0.00–0.07)
Basophils Absolute: 0.1 10*3/uL (ref 0.0–0.1)
Basophils Relative: 1 %
Eosinophils Absolute: 0.3 10*3/uL (ref 0.0–0.5)
Eosinophils Relative: 3 %
HCT: 29.2 % — ABNORMAL LOW (ref 39.0–52.0)
Hemoglobin: 9.2 g/dL — ABNORMAL LOW (ref 13.0–17.0)
Immature Granulocytes: 1 %
Lymphocytes Relative: 6 %
Lymphs Abs: 0.5 10*3/uL — ABNORMAL LOW (ref 0.7–4.0)
MCH: 33 pg (ref 26.0–34.0)
MCHC: 31.5 g/dL (ref 30.0–36.0)
MCV: 104.7 fL — ABNORMAL HIGH (ref 80.0–100.0)
Monocytes Absolute: 1.3 10*3/uL — ABNORMAL HIGH (ref 0.1–1.0)
Monocytes Relative: 15 %
Neutro Abs: 6.1 10*3/uL (ref 1.7–7.7)
Neutrophils Relative %: 74 %
Platelet Count: 287 10*3/uL (ref 150–400)
RBC: 2.79 MIL/uL — ABNORMAL LOW (ref 4.22–5.81)
RDW: 16.8 % — ABNORMAL HIGH (ref 11.5–15.5)
WBC Count: 8.3 10*3/uL (ref 4.0–10.5)
nRBC: 0 % (ref 0.0–0.2)

## 2018-08-15 LAB — CMP (CANCER CENTER ONLY)
ALT: 15 U/L (ref 0–44)
AST: 18 U/L (ref 15–41)
Albumin: 3.2 g/dL — ABNORMAL LOW (ref 3.5–5.0)
Alkaline Phosphatase: 96 U/L (ref 38–126)
Anion gap: 10 (ref 5–15)
BUN: 15 mg/dL (ref 8–23)
CO2: 20 mmol/L — ABNORMAL LOW (ref 22–32)
Calcium: 10.8 mg/dL — ABNORMAL HIGH (ref 8.9–10.3)
Chloride: 104 mmol/L (ref 98–111)
Creatinine: 1.03 mg/dL (ref 0.61–1.24)
GFR, Est AFR Am: >60 mL/min (ref >60)
GFR, Estimated: >60 mL/min (ref >60)
Glucose, Bld: 91 mg/dL (ref 70–99)
Potassium: 4 mmol/L (ref 3.5–5.1)
Sodium: 134 mmol/L — ABNORMAL LOW (ref 135–145)
Total Bilirubin: 0.7 mg/dL (ref 0.3–1.2)
Total Protein: 6.1 g/dL — ABNORMAL LOW (ref 6.5–8.1)

## 2018-08-15 MED ORDER — SODIUM CHLORIDE 0.9 % IV SOLN
Freq: Once | INTRAVENOUS | Status: AC
  Administered 2018-08-15: 14:00:00 via INTRAVENOUS
  Filled 2018-08-15: qty 250

## 2018-08-15 MED ORDER — SODIUM CHLORIDE 0.9% FLUSH
10.0000 mL | Freq: Once | INTRAVENOUS | Status: DC | PRN
  Filled 2018-08-15: qty 10

## 2018-08-15 MED ORDER — HEPARIN SOD (PORK) LOCK FLUSH 100 UNIT/ML IV SOLN
250.0000 [IU] | Freq: Once | INTRAVENOUS | Status: DC | PRN
  Filled 2018-08-15: qty 5

## 2018-08-15 MED ORDER — SODIUM CHLORIDE 0.9% FLUSH
3.0000 mL | Freq: Once | INTRAVENOUS | Status: DC | PRN
  Filled 2018-08-15: qty 10

## 2018-08-15 MED ORDER — ALTEPLASE 2 MG IJ SOLR
2.0000 mg | Freq: Once | INTRAMUSCULAR | Status: DC | PRN
  Filled 2018-08-15: qty 2

## 2018-08-15 MED ORDER — HEPARIN SOD (PORK) LOCK FLUSH 100 UNIT/ML IV SOLN
500.0000 [IU] | Freq: Once | INTRAVENOUS | Status: DC | PRN
  Filled 2018-08-15: qty 5

## 2018-08-15 NOTE — Patient Instructions (Signed)

## 2018-08-16 ENCOUNTER — Telehealth: Payer: Self-pay | Admitting: Hematology

## 2018-08-16 LAB — URINE CULTURE: Culture: <10000 — AB

## 2018-08-16 NOTE — Telephone Encounter (Signed)
Scheduled appt per 4/15 los. °

## 2018-08-17 ENCOUNTER — Telehealth: Payer: Self-pay

## 2018-08-17 NOTE — Telephone Encounter (Signed)
-----   Message from Truitt Merle, MD sent at 08/17/2018  2:12 PM EDT ----- Please let pt know his urine culture was negative for infection, thanks   Truitt Merle  08/17/2018

## 2018-08-17 NOTE — Telephone Encounter (Signed)
Pt. Informed urine culture results were negative for infection. Pt. Verbalized understanding. No further problems or concerns noted.

## 2018-09-06 ENCOUNTER — Telehealth: Payer: Self-pay | Admitting: Radiation Oncology

## 2018-09-06 MED ORDER — HYDROCODONE-ACETAMINOPHEN 7.5-325 MG/15ML PO SOLN: 10.0000 mL | Freq: Four times a day (QID) | ORAL | 0 refills | Status: DC | PRN

## 2018-09-06 NOTE — Telephone Encounter (Signed)
  Radiation Oncology         [phone] ________________________________  Name: Philip [name] MRN: [id number]  Date of Service: 09/06/2018  DOB: 02/01/1946  Post Treatment Telephone Note  Diagnosis:  Stage IB-IIA, cT1-2N0M0, poorly differentiated adenocarcinoma of the GE junction.   Interval Since Last Radiation:  7 weeks   06/06/2018-07/16/2018:  The esophagus was treated to 45 Gy in 25 fractions followed by a 5.4 Gy, 3 fraction boost to total 50.4 Gy.    Narrative:  The patient was contacted today for routine follow-up. He was diagnosed with GE junction cancer and received ChemoRT. Of note he was not a candidate for esophagectomy During treatment he did very well with radiotherapy and did not have significant desquamation, but did struggle with esophagitis. He has been able to eat and drink better in the last few weeks. He still has pain with swallowing and is out of Hycet.  Impression/Plan: 1. Stage IB-IIA, cT1-2N0M0, poorly differentiated adenocarcinoma of the GE junction. The patient has been doing well since completion of radiotherapy. We discussed that we would be happy to continue to follow him as needed, but he will also continue to follow up with Dr. Burr Medico in medical oncology. He will call with questions or concerns. 2. Radiation esophagitis. I sent in a new prescription for Hycet for the patient and also suggested he try OTC mylanta and that his symptoms should resolve in the coming weeks.    Carola Rhine, PAC

## 2018-09-10 ENCOUNTER — Ambulatory Visit: Payer: Medicare Other | Admitting: Radiation Oncology

## 2018-09-10 NOTE — Progress Notes (Signed)
[place]   Telephone:([phone]) [phone] Fax:([phone]) [phone]   Clinic Follow up Note   Patient Care Team: [name], MD as PCP - General (Family Medicine) Mansouraty, [name], MD as Consulting Physician (Gastroenterology)  Date of Service:  09/13/2018  CHIEF COMPLAINT: F/u GEJ cancer  SUMMARY OF ONCOLOGIC HISTORY: Oncology History   Cancer Staging Malignant neoplasm of gastroesophageal junction ([place]) Staging form: Esophagus - Adenocarcinoma, AJCC 8th Edition - Clinical stage from 02/26/2018: Stage IIB (cT2, cN0, cM0, G3) - Signed by [name], NP on 02/26/2018      Malignant neoplasm of gastroesophageal junction ([place])   01/22/2018 Initial Biopsy    Diagnosis Surgical [P], GE junction nodule BX - POORLY 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    01/22/2018 Procedure    COLONOSCOPY IMPRESSION - Redundant colon. - The examination was otherwise normal on direct and retroflexion views. - No specimens collected.  UPPER ENDOSCOPY IMPRESSION: - Esophageal mucosal changes secondary to established short-segment Barrett's disease. - Mucosal nodule found in the esophagus. Biopsied. - Erythematous mucosa in the prepyloric region of the stomach. - Submucosal nodule found in the duodenum.     02/01/2018 Initial Diagnosis    Malignant neoplasm of gastroesophageal junction ([place])    02/05/2018 Imaging    CT CAP IMPRESSION: 1. No esophageal primary identified. No findings of metastatic disease in the chest, abdomen, or pelvis. 2.  Aortic Atherosclerosis (ICD10-I70.0).    02/12/2018 Procedure    EUS Impression: - A mass was found in the distal esophagus into the gastroesophageal junction and extending to proximal cardia. A tissue diagnosis was obtained prior to this exam consistent with adenocarcinoma. This was staged at T2N0Mx, because of a loss of interface of the muscularis propria as the distal GE  Junction becomes the cardia and the mass moves into this region, but there are many more regions and images that suggest a T1 lesion into the submucosa.    05/29/2018 Imaging    NM PET Image Initial (PI) Skull Base To Thigh  IMPRESSION: 1. Hypermetabolic focus at the GE junction consistent with know adenocarcinoma. 2. No evidence of metastatic adenopathy in the mediastinum or upper abdomen. 3. No evidence of liver metastasis.  No pulmonary metastasis.    06/06/2018 - 07/10/2018 Chemotherapy    Weeklycarboplatin and Taxol, with concurrent radiationstarting on 06/06/2018-07/10/18    06/06/2018 - 07/16/2018 Radiation Therapy    Concurrent chemoRT with Dr. Harvel Ricks on 06/06/2018-07/16/18      CURRENT THERAPY:  Surveillance  INTERVAL HISTORY:  Philip [name] is here for a follow up of GEJ Cancer. He presents to the clinic today by himself. He notes he is doing better. He feels he is not back to baseline. He is still tired which he attributes to his Myasthenia gravis. He has very mild odynophagia now. He still has chronic back pain for which he sees pain specialist for. He is on Tramadol. I reviewed his medication list with him. He is no longer using magic mouthwash. He still takes hycet BID for his throat. He is no longer taking Sulcralfate.  He notes he had a tick bite on anterior lower left arm. This notes this bite occurred 1 week ago. He denies fever or dizziness.    REVIEW OF SYSTEMS:   Constitutional: Denies fevers, chills or abnormal weight loss (+) Fatigue  Eyes: Denies blurriness of vision Ears, nose, mouth, throat, and face: Denies mucositis or sore throat (+) Very mild dysphagia/odynophagia  Respiratory: Denies cough, dyspnea or wheezes Cardiovascular: Denies palpitation, chest discomfort or lower extremity swelling Gastrointestinal:  Denies nausea, heartburn or change in bowel habits Skin: Denies abnormal skin rashes MSK: (+) Chronic back pain  Lymphatics: Denies new  lymphadenopathy or easy bruising Neurological:Denies numbness, tingling or new weaknesses Behavioral/Psych: Mood is stable, no new changes  All other systems were reviewed with the patient and are negative.  MEDICAL HISTORY:  Past Medical History:  Diagnosis Date   Abdominal hernia    Anemia    Asthma    Barrett's esophagus    Cancer (HCC)    Esophagel cancer   Cataract    Bil surgery   Cough    over 2 weeks   Fluid retention in legs    GERD (gastroesophageal reflux disease)    H/O hiatal hernia    Hypertension    Myasthenia gravis ([place])    Sleep apnea    don't use c-pap at home/ tried for several years   SOB (shortness of breath)     SURGICAL HISTORY: Past Surgical History:  Procedure Laterality Date   BACK SURGERY     5th lumbar   BALLOON DILATION N/A 10/19/2016   Procedure: BALLOON DILATION;  Surgeon: Ladene Artist, MD;  Location: [facility];  Service: Endoscopy;  Laterality: N/A;   COLONOSCOPY W/ POLYPECTOMY     ESOPHAGOGASTRODUODENOSCOPY N/A 10/19/2016   Procedure: ESOPHAGOGASTRODUODENOSCOPY (EGD);  Surgeon: Ladene Artist, MD;  Location: [facility];  Service: Endoscopy;  Laterality: N/A;   ESOPHAGOGASTRODUODENOSCOPY (EGD) WITH PROPOFOL N/A 02/12/2018   Procedure: ESOPHAGOGASTRODUODENOSCOPY (EGD) WITH PROPOFOL;  Surgeon: [name], MD;  Location: [facility];  Service: Gastroenterology;  Laterality: N/A;   EUS N/A 02/12/2018   Procedure: UPPER ENDOSCOPIC ULTRASOUND (EUS) RADIAL;  Surgeon: Irving Copas., MD;  Location: [facility];  Service: Gastroenterology;  Laterality: N/A;   EYE SURGERY Bilateral    cataract   LUMBAR LAMINECTOMY     1980's    I have reviewed the social history and family history with the patient and they are unchanged from previous note.  ALLERGIES:  has No Known Allergies.  MEDICATIONS:  Current Outpatient Medications  Medication Sig Dispense Refill   ALBUTEROL IN Inhale 2 puffs  into the lungs every 4 (four) hours as needed (shortness of breath or wheezing).      amitriptyline (ELAVIL) 25 MG tablet Take 25 mg by mouth daily.      aspirin EC 81 MG tablet Take 81 mg by mouth daily.     atorvastatin (LIPITOR) 10 MG tablet Take 10 mg by mouth at bedtime.      Esomeprazole Magnesium 20 MG TBEC Take 40 mg by mouth daily at 12 noon.      feeding supplement, ENSURE ENLIVE, (ENSURE ENLIVE) LIQD Take 237 mLs by mouth 2 (two) times daily between meals. 237 mL 12   HYDROcodone-acetaminophen (HYCET) 7.5-325 mg/15 ml solution Take 10 mLs by mouth every 6 (six) hours as needed for moderate pain. 240 mL 0   magic mouthwash w/lidocaine SOLN Take 5 mLs by mouth every 4 (four) hours as needed for mouth pain. 500 mL 1   Multiple Vitamin (MULTIVITAMIN) tablet Take 1 tablet by mouth daily.     mycophenolate (CELLCEPT) 500 MG tablet Take 2 tablets (1,000 mg total) by mouth 2 (two) times daily. 120 tablet 12   ondansetron (ZOFRAN) 8 MG tablet Take 1 tablet (8 mg total) by mouth 2 (two) times daily as needed for refractory nausea /  vomiting. Start on day 3 after chemo. 30 tablet 1   prochlorperazine (COMPAZINE) 10 MG tablet Take 1 tablet (10 mg total) by mouth every 6 (six) hours as needed (Nausea or vomiting). 30 tablet 1   pyridostigmine (MESTINON) 60 MG tablet Take 0.5-1 tablets (30-60 mg total) by mouth 3 (three) times daily. 90 tablet 12   sucralfate (CARAFATE) 1 g tablet Take 1 tablet (1 g total) by mouth 4 (four) times daily -  with meals and at bedtime. 120 tablet 2   tamsulosin (FLOMAX) 0.4 MG CAPS capsule Take 1 capsule (0.4 mg total) by mouth daily after supper. 30 capsule 0   traMADol (ULTRAM) 50 MG tablet Take 50 mg by mouth every 6 (six) hours as needed for pain.     No current facility-administered medications for this visit.     PHYSICAL EXAMINATION: ECOG PERFORMANCE STATUS: 1 - Symptomatic but completely ambulatory  Vitals:   09/13/18 0948  BP: 112/60    Pulse: 76  Resp: 18  Temp: (!) 97.5 F (36.4 C)  SpO2: 97%   Filed Weights   09/13/18 0948  Weight: 261 lb 14.4 oz (118.8 kg)    GENERAL:alert, no distress and comfortable SKIN: skin color, texture, turgor are normal, no rashes or significant lesions EYES: normal, Conjunctiva are pink and non-injected, sclera clear OROPHARYNX:no exudate, no erythema and lips, buccal mucosa, and tongue normal  NECK: supple, thyroid normal size, non-tender, without nodularity LYMPH:  no palpable lymphadenopathy in the cervical, axillary or inguinal LUNGS: clear to auscultation and percussion with normal breathing effort HEART: regular rate & rhythm and no murmurs and no lower extremity edema ABDOMEN:abdomen soft, non-tender and normal bowel sounds Musculoskeletal:no cyanosis of digits and no clubbing  NEURO: alert & oriented x 3 with fluent speech, no focal motor/sensory deficits  LABORATORY DATA:  I have reviewed the data as listed CBC Latest Ref Rng & Units 09/13/2018 08/15/2018 08/08/2018  WBC 4.0 - 10.5 K/uL 6.0 8.3 7.4  Hemoglobin 13.0 - 17.0 g/dL 10.0(L) 9.2(L) 9.2(L)  Hematocrit 39.0 - 52.0 % 30.9(L) 29.2(L) 28.1(L)  Platelets 150 - 400 K/uL 180 287 261     CMP Latest Ref Rng & Units 09/13/2018 08/15/2018 08/08/2018  Glucose 70 - 99 mg/dL 89 91 119(H)  BUN 8 - 23 mg/dL 21 15 16   Creatinine 0.61 - 1.24 mg/dL 0.91 1.03 1.63(H)  Sodium 135 - 145 mmol/L 134(L) 134(L) 135  Potassium 3.5 - 5.1 mmol/L 4.2 4.0 4.0  Chloride 98 - 111 mmol/L 106 104 104  CO2 22 - 32 mmol/L 21(L) 20(L) 23  Calcium 8.9 - 10.3 mg/dL 10.1 10.8(H) 10.6(H)  Total Protein 6.5 - 8.1 g/dL 5.8(L) 6.1(L) 6.0(L)  Total Bilirubin 0.3 - 1.2 mg/dL 0.5 0.7 0.9  Alkaline Phos 38 - 126 U/L 82 96 97  AST 15 - 41 U/L 17 18 24   ALT 0 - 44 U/L 15 15 16       RADIOGRAPHIC STUDIES: I have personally reviewed the radiological images as listed and agreed with the findings in the report. No results found.   ASSESSMENT & PLAN:   Philip [name] is a 73 y.o. male with   1. 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 -Due to his age and comorbidities, he is not a good candidate for surgery.He completed concurrent chemoRT with CT. -He has recent tick bite last week, currently asymptomatic. Will test Lyme disease. I  encouraged him to watch for fever, nausea and dizziness.  -He has recovered well from chemoRT and continues to improve. He has very mild dysphagia/odynophagia, fatigue and continues to have chronic back pain. He is eating adequately.  -Labs reviewed, CBC WNL except hg 10, CMP unremarkable. Iron panel and CEA are still pending.  -If next scan is clear, I recommend repeat endoscopy with Dr. Loletha Carrow in July or August  -F/u in 2 months with PET scan (Or CT Scan if PET not approved)  2. Hypotension andAKI  -Patient completedradiationlast month. He waspreviouslyfound to be hypotensive with systolic blood pressure in 70swith AKI andwas hospitalized  -resolved now    3.Dysphagia and odynophagia -Hehas difficultyswallowing and Sulcralfatehasnot improve his pain -I discussed with Hydrocodone liquid is narcotics and would reduce use. I recommend he start to ween off it. If he does not have significant pain he should not use it. He understands.  -Very mild dysphagia/odynophagia. He only uses Hycet BID as needed. I discussed as he continues to wean off, his Tramadol should help with the pain.   4. Weight loss and malnutrition -He has lost a significant weight since he started chemoradiation, secondary to dysphagia and odynophagia -Ipreviouslyencouraged him to increase oral nutrition supplement, especially boost -He has better diet and has 1 gallon intake of liquid daily. Odynophagia very mild now. Weight overall stable.  5.Chronic backpain  -Takestramadol as  needed -Continue toF/uwith pain specialist -stable.   6. HTN -was onlisinopril. Due to hypotension Ipreviouslyinstructed him to hold lisinopril and add salt to diet -F/u with PCP -BP normalized 112/60 today (09/13/18).    7.Myasthenia gravis -Oncellcept 1000mg BIDfor longer term immunosuppression -He is at high risk for infections due to prior chemo -Stable with fatigue   8. Hypercalcemia,primary hyperparathyroidism -Ipreviouslyrecommend low-dose calcium and vitamin D supplement,he will take multivitaminswhich contains low dose calcium -Labssupporting primary hyperparathyroidism -Ca at10.1today (09/13/18).  -f/u with PCP    Plan -He continues to clinically improve -F/u in 8 weeks with lab and PET a few days before    No problem-specific Assessment & Plan notes found for this encounter.   Orders Placed This Encounter  Procedures   NM PET Image Restag (PS) Skull Base To Thigh    Standing Status:   Future    Standing Expiration Date:   09/13/2019    Order Specific Question:   If indicated for the ordered procedure, I authorize the administration of a radiopharmaceutical per Radiology protocol    Answer:   Yes    Order Specific Question:   Preferred imaging location?    Answer:   [facility]    Order Specific Question:   Radiology Contrast Protocol - do NOT remove file path    Answer:   [email or web address]   All questions were answered. The patient knows to call the clinic with any problems, questions or concerns. No barriers to learning was detected. I spent 20 minutes counseling the patient face to face. The total time spent in the appointment was 25 minutes and more than 50% was on counseling and review of test results     Truitt Merle, MD 09/13/2018   I, Joslyn Devon, am acting as scribe for Truitt Merle, MD.   I have reviewed the above documentation for accuracy and completeness, and I agree with the above.

## 2018-09-13 ENCOUNTER — Encounter: Payer: Self-pay | Admitting: Hematology

## 2018-09-13 ENCOUNTER — Inpatient Hospital Stay (HOSPITAL_BASED_OUTPATIENT_CLINIC_OR_DEPARTMENT_OTHER): Payer: Medicare Other | Admitting: Hematology

## 2018-09-13 ENCOUNTER — Other Ambulatory Visit: Payer: Self-pay | Admitting: Hematology

## 2018-09-13 ENCOUNTER — Inpatient Hospital Stay: Payer: Medicare Other | Attending: Nurse Practitioner

## 2018-09-13 ENCOUNTER — Other Ambulatory Visit: Payer: Self-pay

## 2018-09-13 ENCOUNTER — Telehealth: Payer: Self-pay | Admitting: Hematology

## 2018-09-13 VITALS — BP 112/60 | HR 76 | Temp 97.5°F | Resp 18 | Ht 72.0 in | Wt 261.9 lb

## 2018-09-13 DIAGNOSIS — M549 Dorsalgia, unspecified: Secondary | ICD-10-CM

## 2018-09-13 DIAGNOSIS — Z79899 Other long term (current) drug therapy: Secondary | ICD-10-CM | POA: Insufficient documentation

## 2018-09-13 DIAGNOSIS — R1310 Dysphagia, unspecified: Secondary | ICD-10-CM

## 2018-09-13 DIAGNOSIS — G7000 Myasthenia gravis without (acute) exacerbation: Secondary | ICD-10-CM

## 2018-09-13 DIAGNOSIS — C160 Malignant neoplasm of cardia: Secondary | ICD-10-CM | POA: Insufficient documentation

## 2018-09-13 DIAGNOSIS — Z923 Personal history of irradiation: Secondary | ICD-10-CM

## 2018-09-13 DIAGNOSIS — E210 Primary hyperparathyroidism: Secondary | ICD-10-CM

## 2018-09-13 DIAGNOSIS — R634 Abnormal weight loss: Secondary | ICD-10-CM | POA: Insufficient documentation

## 2018-09-13 DIAGNOSIS — G8929 Other chronic pain: Secondary | ICD-10-CM

## 2018-09-13 DIAGNOSIS — Z9221 Personal history of antineoplastic chemotherapy: Secondary | ICD-10-CM

## 2018-09-13 LAB — CBC WITH DIFFERENTIAL (CANCER CENTER ONLY)
Abs Immature Granulocytes: 0.03 10*3/uL (ref 0.00–0.07)
Basophils Absolute: 0.1 10*3/uL (ref 0.0–0.1)
Basophils Relative: 1 %
Eosinophils Absolute: 0.2 10*3/uL (ref 0.0–0.5)
Eosinophils Relative: 3 %
HCT: 30.9 % — ABNORMAL LOW (ref 39.0–52.0)
Hemoglobin: 10 g/dL — ABNORMAL LOW (ref 13.0–17.0)
Immature Granulocytes: 1 %
Lymphocytes Relative: 9 %
Lymphs Abs: 0.6 10*3/uL — ABNORMAL LOW (ref 0.7–4.0)
MCH: 32.9 pg (ref 26.0–34.0)
MCHC: 32.4 g/dL (ref 30.0–36.0)
MCV: 101.6 fL — ABNORMAL HIGH (ref 80.0–100.0)
Monocytes Absolute: 0.7 10*3/uL (ref 0.1–1.0)
Monocytes Relative: 11 %
Neutro Abs: 4.6 10*3/uL (ref 1.7–7.7)
Neutrophils Relative %: 75 %
Platelet Count: 180 10*3/uL (ref 150–400)
RBC: 3.04 MIL/uL — ABNORMAL LOW (ref 4.22–5.81)
RDW: 13.8 % (ref 11.5–15.5)
WBC Count: 6 10*3/uL (ref 4.0–10.5)
nRBC: 0 % (ref 0.0–0.2)

## 2018-09-13 LAB — IRON AND TIBC
Iron: 67 ug/dL (ref 42–163)
Saturation Ratios: 26 % (ref 20–55)
TIBC: 255 ug/dL (ref 202–409)
UIBC: 188 ug/dL (ref 117–376)

## 2018-09-13 LAB — CMP (CANCER CENTER ONLY)
ALT: 15 U/L (ref 0–44)
AST: 17 U/L (ref 15–41)
Albumin: 3.4 g/dL — ABNORMAL LOW (ref 3.5–5.0)
Alkaline Phosphatase: 82 U/L (ref 38–126)
Anion gap: 7 (ref 5–15)
BUN: 21 mg/dL (ref 8–23)
CO2: 21 mmol/L — ABNORMAL LOW (ref 22–32)
Calcium: 10.1 mg/dL (ref 8.9–10.3)
Chloride: 106 mmol/L (ref 98–111)
Creatinine: 0.91 mg/dL (ref 0.61–1.24)
GFR, Est AFR Am: >60 mL/min (ref >60)
GFR, Estimated: >60 mL/min (ref >60)
Glucose, Bld: 89 mg/dL (ref 70–99)
Potassium: 4.2 mmol/L (ref 3.5–5.1)
Sodium: 134 mmol/L — ABNORMAL LOW (ref 135–145)
Total Bilirubin: 0.5 mg/dL (ref 0.3–1.2)
Total Protein: 5.8 g/dL — ABNORMAL LOW (ref 6.5–8.1)

## 2018-09-13 LAB — FERRITIN: Ferritin: 377 ng/mL — ABNORMAL HIGH (ref 24–336)

## 2018-09-13 LAB — CEA (IN HOUSE-CHCC): CEA (CHCC-In House): 1.94 ng/mL (ref 0.00–5.00)

## 2018-09-13 NOTE — Telephone Encounter (Signed)
Scheduled appt per 5/14 los. ° °A calendar will be mailed out. °

## 2018-09-14 ENCOUNTER — Telehealth: Payer: Self-pay

## 2018-09-14 NOTE — Telephone Encounter (Signed)
Spoke with patient regarding lab results.  CEA is normal, iron studies are in good range.  Continue multivitamin, no need for additional iron.   Patient verbalized an understanding.

## 2018-09-14 NOTE — Telephone Encounter (Signed)
-----   Message from [name], NP sent at 09/13/2018  4:30 PM EDT ----- Please let him know labs. CEA is normal. Iron studies in good range. Continue multivitamin, no need for additional iron. Thanks, Regan Rakers NP

## 2018-11-01 NOTE — Progress Notes (Signed)
Philip [name]   [phone] [phone]   Clinic Follow up Note   Patient Care Team: [name], MD as PCP - General (Family Medicine) Mansouraty, [name], MD as Consulting Physician (Gastroenterology) Zonia Kief, MD (Rehabilitation)  Date of Service:  11/08/2018  CHIEF COMPLAINT: F/u GEJ cancer  SUMMARY OF ONCOLOGIC HISTORY: Oncology History Overview Note  Cancer Staging Malignant neoplasm of gastroesophageal junction [facility]) Staging form: Esophagus - Adenocarcinoma, AJCC 8th Edition - Clinical stage from 02/26/2018: Stage IIB (cT2, cN0, cM0, G3) - Signed by [name], NP on 02/26/2018    Malignant neoplasm of gastroesophageal junction ([place])  01/22/2018 Initial Biopsy   Diagnosis Surgical [P], GE junction nodule BX - POORLY 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   01/22/2018 Procedure   COLONOSCOPY IMPRESSION - Redundant colon. - The examination was otherwise normal on direct and retroflexion views. - No specimens collected.  UPPER ENDOSCOPY IMPRESSION: - Esophageal mucosal changes secondary to established short-segment Barrett's disease. - Mucosal nodule found in the esophagus. Biopsied. - Erythematous mucosa in the prepyloric region of the stomach. - Submucosal nodule found in the duodenum.    02/01/2018 Initial Diagnosis   Malignant neoplasm of gastroesophageal junction ([place])   02/05/2018 Imaging   CT CAP IMPRESSION: 1. No esophageal primary identified. No findings of metastatic disease in the chest, abdomen, or pelvis. 2.  Aortic Atherosclerosis (ICD10-I70.0).   02/12/2018 Procedure   EUS Impression: - A mass was found in the distal esophagus into the gastroesophageal junction and extending to proximal cardia. A tissue diagnosis was obtained prior to this exam consistent with adenocarcinoma. This was staged at T2N0Mx, because of a loss of interface of the  muscularis propria as the distal GE Junction becomes the cardia and the mass moves into this region, but there are many more regions and images that suggest a T1 lesion into the submucosa.   05/29/2018 Imaging   NM PET Image Initial (PI) Skull Base To Thigh  IMPRESSION: 1. Hypermetabolic focus at the GE junction consistent with know adenocarcinoma. 2. No evidence of metastatic adenopathy in the mediastinum or upper abdomen. 3. No evidence of liver metastasis.  No pulmonary metastasis.   06/06/2018 -  Chemotherapy   Weeklycarboplatin and Taxol, with concurrent radiationstarting on 06/06/2018-07/10/18   06/06/2018 - 07/16/2018 Radiation Therapy   Concurrent chemoRT with Dr. Harvel Ricks on 06/06/2018-07/16/18   11/06/2018 PET scan   PET 11/06/18  IMPRESSION: 1. No residual hypermetabolic activity at the gastroesophageal junction. 2. No evidence of metastatic disease.      CURRENT THERAPY:  Surveillance  INTERVAL HISTORY:  Philip [name] is here for a follow up of GEJ cancer. He presents to the clinic alone. He notes his orthopedic Surgeon, is giving him steriod injections for his back pain. He plans to do this soon with them.  He feels he has receovered from treatment completely, no more dysphagia or odynophagia.  He knows people can have COVID-19 without knowing it. He is interested in being tested for it.     REVIEW OF SYSTEMS:   Constitutional: Denies fevers, chills or abnormal weight loss Eyes: Denies blurriness of vision Ears, nose, mouth, throat, and face: Denies mucositis or sore throat Respiratory: Denies cough, dyspnea or wheezes Cardiovascular: Denies palpitation, chest discomfort or lower extremity swelling Gastrointestinal:  Denies nausea, heartburn or change in bowel habits Skin: Denies abnormal skin rashes Lymphatics: Denies new lymphadenopathy or easy bruising Neurological:Denies numbness, tingling or new weaknesses  Behavioral/Psych: Mood is stable, no new changes   All other systems were reviewed with the patient and are negative.  MEDICAL HISTORY:  Past Medical History:  Diagnosis Date  . Abdominal hernia   . Anemia   . Asthma   . Barrett's esophagus   . Cancer (HCC)    Esophagel cancer  . Cataract    Bil surgery  . Cough    over 2 weeks  . Fluid retention in legs   . GERD (gastroesophageal reflux disease)   . H/O hiatal hernia   . Hypertension   . Myasthenia gravis ([place])   . Sleep apnea    don't use c-pap at home/ tried for several years  . SOB (shortness of breath)     SURGICAL HISTORY: Past Surgical History:  Procedure Laterality Date  . BACK SURGERY     5th lumbar  . BALLOON DILATION N/A 10/19/2016   Procedure: BALLOON DILATION;  Surgeon: Ladene Artist, MD;  Location: [facility];  Service: Endoscopy;  Laterality: N/A;  . COLONOSCOPY W/ POLYPECTOMY    . ESOPHAGOGASTRODUODENOSCOPY N/A 10/19/2016   Procedure: ESOPHAGOGASTRODUODENOSCOPY (EGD);  Surgeon: Ladene Artist, MD;  Location: [facility];  Service: Endoscopy;  Laterality: N/A;  . ESOPHAGOGASTRODUODENOSCOPY (EGD) WITH PROPOFOL N/A 02/12/2018   Procedure: ESOPHAGOGASTRODUODENOSCOPY (EGD) WITH PROPOFOL;  Surgeon: [name], MD;  Location: [facility];  Service: Gastroenterology;  Laterality: N/A;  . EUS N/A 02/12/2018   Procedure: UPPER ENDOSCOPIC ULTRASOUND (EUS) RADIAL;  Surgeon: [name], MD;  Location: [facility];  Service: Gastroenterology;  Laterality: N/A;  . EYE SURGERY Bilateral    cataract  . LUMBAR LAMINECTOMY     1980's    I have reviewed the social history and family history with the patient and they are unchanged from previous note.  ALLERGIES:  has No Known Allergies.  MEDICATIONS:  Current Outpatient Medications  Medication Sig Dispense Refill  . ALBUTEROL IN Inhale 2 puffs into the lungs every 4 (four) hours as needed (shortness of breath or wheezing).     [name] amitriptyline (ELAVIL) 25 MG tablet Take 25 mg by mouth  daily.     [name] aspirin EC 81 MG tablet Take 81 mg by mouth daily.    [name] atorvastatin (LIPITOR) 10 MG tablet Take 10 mg by mouth at bedtime.     . Esomeprazole Magnesium 20 MG TBEC Take 40 mg by mouth daily at 12 noon.     . feeding supplement, ENSURE ENLIVE, (ENSURE ENLIVE) LIQD Take 237 mLs by mouth 2 (two) times daily between meals. 237 mL 12  . Multiple Vitamin (MULTIVITAMIN) tablet Take 1 tablet by mouth daily.    . mycophenolate (CELLCEPT) 500 MG tablet Take 2 tablets (1,000 mg total) by mouth 2 (two) times daily. 120 tablet 12  . ondansetron (ZOFRAN) 8 MG tablet Take 1 tablet (8 mg total) by mouth 2 (two) times daily as needed for refractory nausea / vomiting. Start on day 3 after chemo. 30 tablet 1  . prochlorperazine (COMPAZINE) 10 MG tablet Take 1 tablet (10 mg total) by mouth every 6 (six) hours as needed (Nausea or vomiting). 30 tablet 1  . pyridostigmine (MESTINON) 60 MG tablet Take 0.5-1 tablets (30-60 mg total) by mouth 3 (three) times daily. 90 tablet 12  . sucralfate (CARAFATE) 1 g tablet Take 1 tablet (1 g total) by mouth 4 (four) times daily -  with meals and at bedtime. 120 tablet 2  . tamsulosin (FLOMAX) 0.4 MG CAPS capsule Take  1 capsule (0.4 mg total) by mouth daily after supper. 30 capsule 0  . traMADol (ULTRAM) 50 MG tablet Take 50 mg by mouth every 6 (six) hours as needed for pain.     No current facility-administered medications for this visit.     PHYSICAL EXAMINATION: ECOG PERFORMANCE STATUS: 1 - Symptomatic but completely ambulatory  Vitals:   11/08/18 1032  BP: 122/63  Pulse: 85  Resp: 18  Temp: 98.7 F (37.1 C)  SpO2: 97%   Filed Weights   11/08/18 1032  Weight: 265 lb 6.4 oz (120.4 kg)    GENERAL:alert, no distress and comfortable SKIN: skin color, texture, turgor are normal, no rashes or significant lesions EYES: normal, Conjunctiva are pink and non-injected, sclera clear  NECK: supple, thyroid normal size, non-tender, without nodularity LYMPH:  no  palpable lymphadenopathy in the cervical, axillary  LUNGS: clear to auscultation and percussion with normal breathing effort HEART: regular rate & rhythm and no murmurs and no lower extremity edema ABDOMEN:abdomen soft, non-tender and normal bowel sounds Musculoskeletal:no cyanosis of digits and no clubbing  NEURO: alert & oriented x 3 with fluent speech, no focal motor/sensory deficits  LABORATORY DATA:  I have reviewed the data as listed CBC Latest Ref Rng & Units 11/06/2018 09/13/2018 08/15/2018  WBC 4.0 - 10.5 K/uL 5.8 6.0 8.3  Hemoglobin 13.0 - 17.0 g/dL 13.1 10.0(L) 9.2(L)  Hematocrit 39.0 - 52.0 % 39.2 30.9(L) 29.2(L)  Platelets 150 - 400 K/uL 182 180 287     CMP Latest Ref Rng & Units 11/06/2018 09/13/2018 08/15/2018  Glucose 70 - 99 mg/dL 102(H) 89 91  BUN 8 - 23 mg/dL 12 21 15   Creatinine 0.61 - 1.24 mg/dL 0.86 0.91 1.03  Sodium 135 - 145 mmol/L 136 134(L) 134(L)  Potassium 3.5 - 5.1 mmol/L 4.1 4.2 4.0  Chloride 98 - 111 mmol/L 107 106 104  CO2 22 - 32 mmol/L 23 21(L) 20(L)  Calcium 8.9 - 10.3 mg/dL 10.8(H) 10.1 10.8(H)  Total Protein 6.5 - 8.1 g/dL 6.3(L) 5.8(L) 6.1(L)  Total Bilirubin 0.3 - 1.2 mg/dL 0.6 0.5 0.7  Alkaline Phos 38 - 126 U/L 78 82 96  AST 15 - 41 U/L 19 17 18   ALT 0 - 44 U/L 17 15 15       RADIOGRAPHIC STUDIES: I have personally reviewed the radiological images as listed and agreed with the findings in the report. No results found.   ASSESSMENT & PLAN:  Philip [name] is a 73 y.o. male with   1. 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 -Due to his age and comorbidities, he is not a good candidate for surgery.He completed concurrent chemoRT with CT. He recovered well.  -I personally reviewed and discussed his PET from 11/06/18 with pt which shows he has had complete repsonce from chemoRT, no residual or metastatic  disease.  -He is clinically doing well. Labs from this week reveiwed, CBC, CMP and CEA WNL except BG 102, Ca 10.8, protein 6.3.  -To rule out any residual disease, I recommend repeat endoscopy with Dr. Loletha Carrow in July or August.  -I discussed his high risk for recurrance. I discussed the surveillance plan, which is a physical exam and lab test (including CBC, CMP and CEA) every 3 months for the first 2 years, then every 6-12 months and surveilliance CT scan every 6-12 month for up to 5 year. If he does have recurrance  or metastatic disease, systemic chemo would be recommended. He understands.  -F/u in 3 months.  -He is interested in COVID-19 testing. H will likely have one with endoscopy. Or he can have it sooner through [facility] or [facility].    2.Dysphagia and odynophagia -Hehas difficultyswallowing and Sulcralfatehasnot improve his pain -I discussed with Hydrocodone liquid is narcotics and would reduce use. I recommend he start to ween off it. If he does not have significant pain he should not use it. He understands. -Dysphagia/odynophagia has resolved now.  3.Chronic backpain  -Takestramadol as needed -Continue toF/uwith pain specialist -He plans to have steriod injecitons to his back for his pain. He is cleared to proceed with his orthopedic surgoen, Dr Maia Petties at [facility]   4. HTN -was onlisinopril. Due to hypotension Ipreviouslyinstructed him to hold lisinopriland add salt to diet -F/u with PCP -BPnormalized 122/63 today (11/08/18).    5.Myasthenia gravis -Oncellcept 1000mg BIDfor longer term immunosuppression -He is at high risk for infections due topriorchemo -Stable with fatigue   6. Hypercalcemia,primary hyperparathyroidism -Ipreviouslyrecommend low-dose calcium and vitamin D supplement,he will take multivitaminswhich contains low dose calcium. I advised him to avoid high dose calcium.  -Labssupporting primary  hyperparathyroidism. I encouraged him to see his PCP about this for treatment.  -Ca at10.8today (11/08/18). -f/u with PCP    Plan -PET scan reviewed, NED. He is cliniclly doing well  -Proceed with Endoscopy, send message to Dr. Loletha Carrow  -I encouraged him to follow up with his PCP about his hyperparathyroidism.  -He is cleared to proceed with steriod injeciton with orthopedic surgeon  -Lab and f/u in 3 months    No problem-specific Assessment & Plan notes found for this encounter.   No orders of the defined types were placed in this encounter.  All questions were answered. The patient knows to call the clinic with any problems, questions or concerns. No barriers to learning was detected. I spent 25 minutes counseling the patient face to face. The total time spent in the appointment was 30 minutes and more than 50% was on counseling and review of test results     Truitt Merle, MD 11/08/2018   I, Joslyn Devon, am acting as scribe for Truitt Merle, MD.   I have reviewed the above documentation for accuracy and completeness, and I agree with the above.

## 2018-11-06 ENCOUNTER — Inpatient Hospital Stay: Payer: Medicare Other | Attending: Nurse Practitioner

## 2018-11-06 ENCOUNTER — Other Ambulatory Visit: Payer: Self-pay

## 2018-11-06 ENCOUNTER — Ambulatory Visit (HOSPITAL_COMMUNITY)
Admission: RE | Admit: 2018-11-06 | Discharge: 2018-11-06 | Disposition: A | Discharge status: Home / Self Care | Payer: Medicare Other | Source: Ambulatory Visit | Attending: Hematology | Admitting: Hematology

## 2018-11-06 DIAGNOSIS — Z923 Personal history of irradiation: Secondary | ICD-10-CM | POA: Diagnosis not present

## 2018-11-06 DIAGNOSIS — K2270 Barrett's esophagus without dysplasia: Secondary | ICD-10-CM | POA: Diagnosis not present

## 2018-11-06 DIAGNOSIS — M549 Dorsalgia, unspecified: Secondary | ICD-10-CM | POA: Diagnosis not present

## 2018-11-06 DIAGNOSIS — Z79899 Other long term (current) drug therapy: Secondary | ICD-10-CM | POA: Insufficient documentation

## 2018-11-06 DIAGNOSIS — J45909 Unspecified asthma, uncomplicated: Secondary | ICD-10-CM | POA: Insufficient documentation

## 2018-11-06 DIAGNOSIS — K219 Gastro-esophageal reflux disease without esophagitis: Secondary | ICD-10-CM | POA: Insufficient documentation

## 2018-11-06 DIAGNOSIS — E210 Primary hyperparathyroidism: Secondary | ICD-10-CM | POA: Diagnosis not present

## 2018-11-06 DIAGNOSIS — C160 Malignant neoplasm of cardia: Secondary | ICD-10-CM | POA: Insufficient documentation

## 2018-11-06 DIAGNOSIS — Z7982 Long term (current) use of aspirin: Secondary | ICD-10-CM | POA: Diagnosis not present

## 2018-11-06 DIAGNOSIS — G4730 Sleep apnea, unspecified: Secondary | ICD-10-CM | POA: Insufficient documentation

## 2018-11-06 DIAGNOSIS — Z9221 Personal history of antineoplastic chemotherapy: Secondary | ICD-10-CM | POA: Insufficient documentation

## 2018-11-06 DIAGNOSIS — I10 Essential (primary) hypertension: Secondary | ICD-10-CM | POA: Insufficient documentation

## 2018-11-06 DIAGNOSIS — G7000 Myasthenia gravis without (acute) exacerbation: Secondary | ICD-10-CM | POA: Diagnosis not present

## 2018-11-06 LAB — CEA (IN HOUSE-CHCC): CEA (CHCC-In House): 2.07 ng/mL (ref 0.00–5.00)

## 2018-11-06 LAB — CMP (CANCER CENTER ONLY)
ALT: 17 U/L (ref 0–44)
AST: 19 U/L (ref 15–41)
Albumin: 3.8 g/dL (ref 3.5–5.0)
Alkaline Phosphatase: 78 U/L (ref 38–126)
Anion gap: 6 (ref 5–15)
BUN: 12 mg/dL (ref 8–23)
CO2: 23 mmol/L (ref 22–32)
Calcium: 10.8 mg/dL — ABNORMAL HIGH (ref 8.9–10.3)
Chloride: 107 mmol/L (ref 98–111)
Creatinine: 0.86 mg/dL (ref 0.61–1.24)
GFR, Est AFR Am: >60 mL/min (ref >60)
GFR, Estimated: >60 mL/min (ref >60)
Glucose, Bld: 102 mg/dL — ABNORMAL HIGH (ref 70–99)
Potassium: 4.1 mmol/L (ref 3.5–5.1)
Sodium: 136 mmol/L (ref 135–145)
Total Bilirubin: 0.6 mg/dL (ref 0.3–1.2)
Total Protein: 6.3 g/dL — ABNORMAL LOW (ref 6.5–8.1)

## 2018-11-06 LAB — CBC WITH DIFFERENTIAL (CANCER CENTER ONLY)
Abs Immature Granulocytes: 0.01 10*3/uL (ref 0.00–0.07)
Basophils Absolute: 0.1 10*3/uL (ref 0.0–0.1)
Basophils Relative: 1 %
Eosinophils Absolute: 0.2 10*3/uL (ref 0.0–0.5)
Eosinophils Relative: 3 %
HCT: 39.2 % (ref 39.0–52.0)
Hemoglobin: 13.1 g/dL (ref 13.0–17.0)
Immature Granulocytes: 0 %
Lymphocytes Relative: 15 %
Lymphs Abs: 0.9 10*3/uL (ref 0.7–4.0)
MCH: 32.3 pg (ref 26.0–34.0)
MCHC: 33.4 g/dL (ref 30.0–36.0)
MCV: 96.6 fL (ref 80.0–100.0)
Monocytes Absolute: 0.6 10*3/uL (ref 0.1–1.0)
Monocytes Relative: 10 %
Neutro Abs: 4.1 10*3/uL (ref 1.7–7.7)
Neutrophils Relative %: 71 %
Platelet Count: 182 10*3/uL (ref 150–400)
RBC: 4.06 MIL/uL — ABNORMAL LOW (ref 4.22–5.81)
RDW: 13.1 % (ref 11.5–15.5)
WBC Count: 5.8 10*3/uL (ref 4.0–10.5)
nRBC: 0 % (ref 0.0–0.2)

## 2018-11-06 LAB — GLUCOSE, CAPILLARY: Glucose-Capillary: 100 mg/dL — ABNORMAL HIGH (ref 70–99)

## 2018-11-06 MED ORDER — FLUDEOXYGLUCOSE F - 18 (FDG) INJECTION
11.1100 | Freq: Once | INTRAVENOUS | Status: AC | PRN
  Administered 2018-11-06: 11.11 via INTRAVENOUS

## 2018-11-08 ENCOUNTER — Telehealth: Payer: Self-pay | Admitting: Hematology

## 2018-11-08 ENCOUNTER — Other Ambulatory Visit: Payer: Self-pay

## 2018-11-08 ENCOUNTER — Inpatient Hospital Stay (HOSPITAL_BASED_OUTPATIENT_CLINIC_OR_DEPARTMENT_OTHER): Payer: Medicare Other | Admitting: Hematology

## 2018-11-08 ENCOUNTER — Encounter: Payer: Self-pay | Admitting: Hematology

## 2018-11-08 VITALS — BP 122/63 | HR 85 | Temp 98.7°F | Resp 18 | Ht 72.0 in | Wt 265.4 lb

## 2018-11-08 DIAGNOSIS — C160 Malignant neoplasm of cardia: Secondary | ICD-10-CM | POA: Diagnosis not present

## 2018-11-08 DIAGNOSIS — M549 Dorsalgia, unspecified: Secondary | ICD-10-CM | POA: Diagnosis not present

## 2018-11-08 DIAGNOSIS — G4730 Sleep apnea, unspecified: Secondary | ICD-10-CM

## 2018-11-08 DIAGNOSIS — I10 Essential (primary) hypertension: Secondary | ICD-10-CM

## 2018-11-08 DIAGNOSIS — G7000 Myasthenia gravis without (acute) exacerbation: Secondary | ICD-10-CM

## 2018-11-08 DIAGNOSIS — Z923 Personal history of irradiation: Secondary | ICD-10-CM | POA: Diagnosis not present

## 2018-11-08 DIAGNOSIS — Z7982 Long term (current) use of aspirin: Secondary | ICD-10-CM

## 2018-11-08 DIAGNOSIS — E210 Primary hyperparathyroidism: Secondary | ICD-10-CM

## 2018-11-08 DIAGNOSIS — Z79899 Other long term (current) drug therapy: Secondary | ICD-10-CM

## 2018-11-08 DIAGNOSIS — J45909 Unspecified asthma, uncomplicated: Secondary | ICD-10-CM

## 2018-11-08 DIAGNOSIS — Z9221 Personal history of antineoplastic chemotherapy: Secondary | ICD-10-CM | POA: Diagnosis not present

## 2018-11-08 NOTE — Telephone Encounter (Signed)
Scheduled appt per 7/9 los. Printed and mailed appt calendar. °

## 2018-11-09 ENCOUNTER — Telehealth: Payer: Self-pay | Admitting: Gastroenterology

## 2018-11-09 NOTE — Telephone Encounter (Signed)
I received a message from Dr. Burr Medico of oncology, who saw the patient yesterday in follow-up for his esophageal cancer. She would like [place] to perform an upper endoscopy to reassess the cancer after the treatment he received.  Please contact the patient and arrange an upper endoscopy with me in the [place] in the next 4 to 6 weeks.  - HD

## 2018-11-13 NOTE — Telephone Encounter (Signed)
Patient has declined to schedule at this time. He has a great concern with trying to find transportation and a caregiver due to the [place]. I have mailed him information from [facility] and put him on my recall list to call back in a few weeks.

## 2018-11-14 NOTE — Telephone Encounter (Signed)
Philip [name] - thank you for planning to check back with him later.    Dr. Burr Medico,  [facility]

## 2018-11-14 NOTE — Telephone Encounter (Signed)
That should be OK, thanks for letting me know.   Truitt Merle MD

## 2018-12-08 ENCOUNTER — Other Ambulatory Visit: Payer: Self-pay | Admitting: Hematology

## 2018-12-10 ENCOUNTER — Ambulatory Visit (INDEPENDENT_AMBULATORY_CARE_PROVIDER_SITE_OTHER): Payer: Medicare Other | Admitting: Diagnostic Neuroimaging

## 2018-12-10 ENCOUNTER — Other Ambulatory Visit: Payer: Self-pay

## 2018-12-10 ENCOUNTER — Encounter: Payer: Self-pay | Admitting: Diagnostic Neuroimaging

## 2018-12-10 VITALS — BP 117/67 | HR 84 | Temp 98.0°F | Ht 72.0 in | Wt 271.0 lb

## 2018-12-10 DIAGNOSIS — G7000 Myasthenia gravis without (acute) exacerbation: Secondary | ICD-10-CM

## 2018-12-10 MED ORDER — MYCOPHENOLATE MOFETIL 500 MG PO TABS: 1000.0000 mg | ORAL_TABLET | Freq: Two times a day (BID) | ORAL | 12 refills | Status: DC

## 2018-12-10 MED ORDER — PYRIDOSTIGMINE BROMIDE 60 MG PO TABS: 30.0000 mg | ORAL_TABLET | Freq: Three times a day (TID) | ORAL | 12 refills | Status: DC

## 2018-12-10 NOTE — Progress Notes (Signed)
[facility]  PATIENT: Philip [name] DOB: 05-Sep-1945  REFERRING CLINICIAN: Delman Cheadle, J HISTORY FROM: patient and chart review REASON FOR VISIT: follow up    HISTORICAL  CHIEF COMPLAINT:  Chief Complaint  Patient presents with  . Myasthenia Gravis    rm 6 FU, "stable"    HISTORY OF PRESENT ILLNESS:   UPDATE (12/10/18, VRP): Since last visit, doing well. Symptoms are resolved from MG. No alleviating or aggravating factors. Tolerating meds.    UPDATE (06/11/18, VRP): Since last visit, now dx'd with "1. 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 Burr Medico (oncology). He was not felt to be good candidate for surgery, so he has been treated with chemotherapy (x1) and radiation (x3) and so far doing well except for fatigue and some SOB (mainly on exertion).   Myasthenia gravis is stable. No double vision. Tolerating cellcept. Off mestinon since Aug 2019 (at patient's request to see if still needed).   UPDATE (12/04/17, VRP): Since last visit, doing well. Symptoms are resolved. Tolerating cellcept and mestinon. No more double vision. No generalized fatigue.   UPDATE (03/13/17, VRP): Since last visit, doing well. IVIG helped quite a bit. Tolerating pyridostigmine. No alleviating or aggravating factors. No generalized weakness but notes some non-specific "tired" feeling. No double vision or drooping eyelids.   UPDATE (01/17/17, VRP): Since last visit, doing well on pyridostigmine. It worked the next day. AchR ab are positive. Some generalized fatigue and weakness are noted and new since last visit. Went to ER for laryngospasm (?upper resp infx) and tx'd with 4 days prednisone and oral lidocaine.    PRIOR HPI (12/21/16): 73 year old male here for evaluation of double vision. Her past to 3 weeks patient has had intermittent double vision.  Symptoms present with both eyes open but improve if he closes one eye or uses an eye patch. He describes objects  as being up and to the side from each other.  They slightly overlap.  This fluctuates.  Symptoms worse later in the day.  He also had some swallowing difficulties for the past 2 months without a specific cause to be found. He has some generalized fatigue as well. Patient went to [facility] eye clinic, with no specific ocular pathology found.  Patient was referred here for consideration of possible neuromuscular, neuro inflammatory or neuro vascular cause of symptoms. No unilateral numbness or weakness.  No sudden strokelike symptoms.  No headaches.  No prodromal factors.   REVIEW OF SYSTEMS: Full 14 system review of systems performed and negative with exception of: as per HPI.  ALLERGIES: No Known Allergies  HOME MEDICATIONS: Outpatient Medications Prior to Visit  Medication Sig Dispense Refill  . ALBUTEROL IN Inhale 2 puffs into the lungs every 4 (four) hours as needed (shortness of breath or wheezing).     [name] amitriptyline (ELAVIL) 25 MG tablet Take 25 mg by mouth daily.     [name] aspirin EC 81 MG tablet Take 81 mg by mouth daily.    [name] atorvastatin (LIPITOR) 10 MG tablet Take 10 mg by mouth at bedtime.     . Esomeprazole Magnesium 20 MG TBEC Take 40 mg by mouth daily at 12 noon.     . Multiple Vitamin (MULTIVITAMIN) tablet Take 1 tablet by mouth daily.    . mycophenolate (CELLCEPT) 500 MG tablet Take 2 tablets (1,000 mg total) by mouth 2 (two) times daily. 120 tablet 12  . ondansetron (ZOFRAN) 8 MG tablet Take 1 tablet (8 mg  total) by mouth 2 (two) times daily as needed for refractory nausea / vomiting. Start on day 3 after chemo. 30 tablet 1  . prochlorperazine (COMPAZINE) 10 MG tablet Take 1 tablet (10 mg total) by mouth every 6 (six) hours as needed (Nausea or vomiting). 30 tablet 1  . pyridostigmine (MESTINON) 60 MG tablet Take 0.5-1 tablets (30-60 mg total) by mouth 3 (three) times daily. 90 tablet 12  . sucralfate (CARAFATE) 1 g tablet TAKE ONE TABLET BY MOUTH FOUR TIMES A DAY WITH MEALS AND AT  BEDTIME 120 tablet 1  . traMADol (ULTRAM) 50 MG tablet Take 50 mg by mouth every 6 (six) hours as needed for pain.    . tamsulosin (FLOMAX) 0.4 MG CAPS capsule Take 1 capsule (0.4 mg total) by mouth daily after supper. 30 capsule 0  . feeding supplement, ENSURE ENLIVE, (ENSURE ENLIVE) LIQD Take 237 mLs by mouth 2 (two) times daily between meals. 237 mL 12   No facility-administered medications prior to visit.     PAST MEDICAL HISTORY: Past Medical History:  Diagnosis Date  . Abdominal hernia   . Anemia   . Asthma   . Barrett's esophagus   . Cancer (HCC)    Esophagel cancer  . Cataract    Bil surgery  . Cough    over 2 weeks  . Fluid retention in legs   . GERD (gastroesophageal reflux disease)   . H/O hiatal hernia   . Hypertension   . Myasthenia gravis ([place])   . Sleep apnea    don't use c-pap at home/ tried for several years  . SOB (shortness of breath)     PAST SURGICAL HISTORY: Past Surgical History:  Procedure Laterality Date  . BACK SURGERY     5th lumbar  . BALLOON DILATION N/A 10/19/2016   Procedure: BALLOON DILATION;  Surgeon: Ladene Artist, MD;  Location: [facility];  Service: Endoscopy;  Laterality: N/A;  . COLONOSCOPY W/ POLYPECTOMY    . ESOPHAGOGASTRODUODENOSCOPY N/A 10/19/2016   Procedure: ESOPHAGOGASTRODUODENOSCOPY (EGD);  Surgeon: Ladene Artist, MD;  Location: [facility];  Service: Endoscopy;  Laterality: N/A;  . ESOPHAGOGASTRODUODENOSCOPY (EGD) WITH PROPOFOL N/A 02/12/2018   Procedure: ESOPHAGOGASTRODUODENOSCOPY (EGD) WITH PROPOFOL;  Surgeon: [name], MD;  Location: [place];  Service: Gastroenterology;  Laterality: N/A;  . EUS N/A 02/12/2018   Procedure: UPPER ENDOSCOPIC ULTRASOUND (EUS) RADIAL;  Surgeon: [name], MD;  Location: [place];  Service: Gastroenterology;  Laterality: N/A;  . EYE SURGERY Bilateral    cataract  . LUMBAR LAMINECTOMY     1980's    FAMILY HISTORY: Family History  Problem  Relation Age of Onset  . Heart disease Mother   . Lung cancer Father   . Colon cancer Neg Hx     SOCIAL HISTORY:  Social History   Socioeconomic History  . Marital status: Single    Spouse name: Not on file  . Number of children: 0  . Years of education: 73  . Highest education level: Not on file  Occupational History    Comment: retired  Scientific laboratory technician  . Financial resource strain: Not on file  . Food insecurity    Worry: Not on file    Inability: Not on file  . Transportation needs    Medical: Yes    Non-medical: Yes  Tobacco Use  . Smoking status: Former Smoker    Packs/day: 1.00    Years: 7.00    Pack years: 7.00  Quit date: 12/21/1976    Years since quitting: 41.9  . Smokeless tobacco: Never Used  Substance and Sexual Activity  . Alcohol use: Not Currently    Comment: ocassional  . Drug use: No  . Sexual activity: Not on file  Lifestyle  . Physical activity    Days per week: Not on file    Minutes per session: Not on file  . Stress: Not on file  Relationships  . Social Herbalist on phone: Not on file    Gets together: Not on file    Attends religious service: Not on file    Active member of club or organization: Not on file    Attends meetings of clubs or organizations: Not on file    Relationship status: Not on file  . Intimate partner violence    Fear of current or ex partner: Not on file    Emotionally abused: Not on file    Physically abused: Not on file    Forced sexual activity: Not on file  Other Topics Concern  . Not on file  Social History Narrative   Lives alone   Caffeine- coffee, tea, sodas 1/2 gallon total daily     PHYSICAL EXAM  GENERAL EXAM/CONSTITUTIONAL: Vitals:  Vitals:   12/10/18 1540  BP: 117/67  Pulse: 84  Temp: 98 F (36.7 C)  Weight: 271 lb (122.9 kg)  Height: 6' (1.829 m)   Wt Readings from Last 10 Encounters:  12/10/18 271 lb (122.9 kg)  11/08/18 265 lb 6.4 oz (120.4 kg)  09/13/18 261 lb 14.4 oz  (118.8 kg)  08/08/18 264 lb 8 oz (120 kg)  07/23/18 262 lb 5.6 oz (119 kg)  07/23/18 262 lb 8 oz (119.1 kg)  07/10/18 275 lb 11.2 oz (125.1 kg)  07/03/18 283 lb (128.4 kg)  06/28/18 286 lb 4.8 oz (129.9 kg)  06/26/18 287 lb 4.8 oz (130.3 kg)    Body mass index is 36.75 kg/m. No exam data present  Patient is in no distress; well developed, nourished and groomed; neck is supple  CARDIOVASCULAR:  Examination of carotid arteries is normal; no carotid bruits  REGULAR rate and rhythm, no murmurs  Examination of peripheral vascular system by observation and palpation is normal  EYES:  Ophthalmoscopic exam of optic discs and posterior segments is normal; no papilledema or hemorrhages  MUSCULOSKELETAL:  Gait, strength, tone, movements noted in Neurologic exam below  NEUROLOGIC: MENTAL STATUS:  No flowsheet data found.  awake, alert, oriented to person, place and time  recent and remote memory intact  normal attention and concentration  language fluent, comprehension intact, naming intact,   fund of knowledge appropriate  CRANIAL NERVE:   2nd - no papilledema on fundoscopic exam  2nd, 3rd, 4th, 6th - pupils equal and reactive to light, visual fields full to confrontation, extraocular muscles intact, no nystagmus; NO PTOSIS; NO DOUBLE VISION  5th - facial sensation symmetric  7th - facial strength symmetric  8th - hearing intact  9th - palate elevates symmetrically, uvula midline  11th - shoulder shrug symmetric  12th - tongue protrusion midline  HOARSE VOICE  MOTOR:   normal bulk and tone, full strength in the BUE, BLE  SENSORY:   normal and symmetric to light touch, temperature  ABSENT VIBRATION IN TOES  COORDINATION:   finger-nose-finger, fine finger movements SLOW  REFLEXES:   deep tendon reflexes TRACE and symmetric; ABSENT AT ANKLES  GAIT/STATION:   SMOOTH GAIT  DIAGNOSTIC DATA (LABS, IMAGING, TESTING) - I reviewed patient  records, labs, notes, testing and imaging myself where available.  Lab Results  Component Value Date   WBC 5.8 11/06/2018   HGB 13.1 11/06/2018   HCT 39.2 11/06/2018   MCV 96.6 11/06/2018   PLT 182 11/06/2018      Component Value Date/Time   NA 136 11/06/2018 1023   NA 138 12/04/2017 1715   K 4.1 11/06/2018 1023   CL 107 11/06/2018 1023   CO2 23 11/06/2018 1023   GLUCOSE 102 (H) 11/06/2018 1023   BUN 12 11/06/2018 1023   BUN 19 12/04/2017 1715   CREATININE 0.86 11/06/2018 1023   CALCIUM 10.8 (H) 11/06/2018 1023   CALCIUM 10.8 (H) 06/28/2018 1347   PROT 6.3 (L) 11/06/2018 1023   PROT 6.0 12/04/2017 1715   ALBUMIN 3.8 11/06/2018 1023   ALBUMIN 4.0 12/04/2017 1715   AST 19 11/06/2018 1023   ALT 17 11/06/2018 1023   ALKPHOS 78 11/06/2018 1023   BILITOT 0.6 11/06/2018 1023   GFRNONAA >60 11/06/2018 1023   GFRAA >60 11/06/2018 1023   No results found for: CHOL, HDL, LDLCALC, LDLDIRECT, TRIG, CHOLHDL Lab Results  Component Value Date   HGBA1C 5.4 12/21/2016   Lab Results  Component Value Date   NIOEVOJJ00 938 07/24/2018   Lab Results  Component Value Date   TSH 2.040 12/21/2016    AChR Binding Ab, Serum  Date Value Ref Range Status  12/21/2016 65.60 (H) 0.00 - 0.24 nmol/L Final    Comment:    **Results verified by repeat testing**                                Negative:   0.00 - 0.24                                Borderline: 0.25 - 0.40                                Positive:        > 0.40     01/10/17 MRI brain  - Abnormal MRI scan of the brain showing mild changes of chronic microvascular ischemia and generalized cerebral atrophy. Incidental changes of chronic paranasal sinusitis are noted. 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     ASSESSMENT AND PLAN  72 y.o. year old male here with fatigable, fluctuating, intermittent double vision for past 2-3 weeks, with approximately 2 months of swallowing difficulty.  Also with  generalized weakness intermittently.  Has positive AchR antibodies and good response to pyridostigmine and IVIG.  Dx: myasthenia gravis  1. Myasthenia gravis ([place])      PLAN:  MYASTHENIA GRAVIS  - continue cellcept 1000mg  twice a day for longer term immunosuppression (will avoid prednisone due to potential side effects of weight gait, HTN, hyperglycemia); caution with chemotherapy and cellcept together (risk of infection) - repeat CBC, CMP every 3-6 months (for cellcept monitoring)  SHORTNESS OF BREATH (improved) - follow up with PCP, oncology, pulmonology for further evaluation  Meds ordered this encounter  Medications  . mycophenolate (CELLCEPT) 500 MG tablet    Sig: Take 2 tablets (1,000 mg total) by mouth 2 (two) times daily.    Dispense:  120 tablet    Refill:  12  .  pyridostigmine (MESTINON) 60 MG tablet    Sig: Take 0.5-1 tablets (30-60 mg total) by mouth 3 (three) times daily.    Dispense:  90 tablet    Refill:  12   Return in about 6 months (around 06/12/2019).    Penni Bombard, MD 11/01/4033, 2:48 PM Certified in Neurology, Neurophysiology and Neuroimaging  [facility] [address], [place], [place] [address] [phone]

## 2018-12-21 ENCOUNTER — Telehealth: Payer: Self-pay | Admitting: *Deleted

## 2018-12-21 NOTE — Telephone Encounter (Signed)
Covid-19 screening questions   Do you now or have you had a fever in the last 14 days no   Do you have any respiratory symptoms of shortness of breath or cough now or in the last 14 days no  Do you have any family members or close contacts with diagnosed or suspected Covid-19 in the past 14 days no  Have you been tested for Covid-19 and found to be positive no          

## 2018-12-22 ENCOUNTER — Ambulatory Visit (AMBULATORY_SURGERY_CENTER): Payer: Medicare Other | Admitting: Gastroenterology

## 2018-12-22 ENCOUNTER — Other Ambulatory Visit: Payer: Self-pay

## 2018-12-22 ENCOUNTER — Encounter: Payer: Self-pay | Admitting: Gastroenterology

## 2018-12-22 ENCOUNTER — Encounter: Payer: Medicare Other | Admitting: Gastroenterology

## 2018-12-22 VITALS — BP 113/64 | HR 68 | Temp 97.8°F | Resp 17 | Ht 72.0 in | Wt 271.0 lb

## 2018-12-22 DIAGNOSIS — K219 Gastro-esophageal reflux disease without esophagitis: Secondary | ICD-10-CM

## 2018-12-22 DIAGNOSIS — K2970 Gastritis, unspecified, without bleeding: Secondary | ICD-10-CM | POA: Diagnosis not present

## 2018-12-22 DIAGNOSIS — C159 Malignant neoplasm of esophagus, unspecified: Secondary | ICD-10-CM

## 2018-12-22 DIAGNOSIS — K2270 Barrett's esophagus without dysplasia: Secondary | ICD-10-CM | POA: Diagnosis present

## 2018-12-22 DIAGNOSIS — K2950 Unspecified chronic gastritis without bleeding: Secondary | ICD-10-CM | POA: Diagnosis not present

## 2018-12-22 DIAGNOSIS — K208 Other esophagitis: Secondary | ICD-10-CM | POA: Diagnosis not present

## 2018-12-22 MED ORDER — SODIUM CHLORIDE 0.9 % IV SOLN: 500.0000 mL | INTRAVENOUS | Status: DC

## 2018-12-22 NOTE — Patient Instructions (Signed)
YOU HAD AN ENDOSCOPIC PROCEDURE TODAY AT [facility]:   Refer to the procedure report that was given to you for any specific questions about what was found during the examination.  If the procedure report does not answer your questions, please call your gastroenterologist to clarify.  If you requested that your care partner not be given the details of your procedure findings, then the procedure report has been included in a sealed envelope for you to review at your convenience later.  YOU SHOULD EXPECT: Some feelings of bloating in the abdomen. Passage of more gas than usual.  Walking can help get rid of the air that was put into your GI tract during the procedure and reduce the bloating. If you had a lower endoscopy (such as a colonoscopy or flexible sigmoidoscopy) you may notice spotting of blood in your stool or on the toilet paper. If you underwent a bowel prep for your procedure, you may not have a normal bowel movement for a few days.  Please Note:  You might notice some irritation and congestion in your nose or some drainage.  This is from the oxygen used during your procedure.  There is no need for concern and it should clear up in a day or so.  SYMPTOMS TO REPORT IMMEDIATELY:   Following upper endoscopy (EGD)  Vomiting of blood or coffee ground material  New chest pain or pain under the shoulder blades  Painful or persistently difficult swallowing  New shortness of breath  Fever of 100F or higher  Black, tarry-looking stools  For urgent or emergent issues, a gastroenterologist can be reached at any hour by calling [phone].   DIET:  We do recommend a small meal at first, but then you may proceed to your regular diet.  Drink plenty of fluids but you should avoid alcoholic beverages for 24 hours.  ACTIVITY:  You should plan to take it easy for the rest of today and you should NOT DRIVE or use heavy machinery until tomorrow (because of the sedation medicines used  during the test).    FOLLOW UP: Our staff will call the number listed on your records 48-72 hours following your procedure to check on you and address any questions or concerns that you may have regarding the information given to you following your procedure. If we do not reach you, we will leave a message.  We will attempt to reach you two times.  During this call, we will ask if you have developed any symptoms of COVID 19. If you develop any symptoms (ie: fever, flu-like symptoms, shortness of breath, cough etc.) before then, please call [phone].  If you test positive for Covid 19 in the 2 weeks post procedure, please call and report this information to [place].    If any biopsies were taken you will be contacted by phone or by letter within the next 1-3 weeks.  Please call us at [phone] if you have not heard about the biopsies in 3 weeks.   SIGNATURES/CONFIDENTIALITY: You and/or your care partner have signed paperwork which will be entered into your electronic medical record.  These signatures attest to the fact that that the information above on your After Visit Summary has been reviewed and is understood.  Full responsibility of the confidentiality of this discharge information lies with you and/or your care-partner.  Await pathology- Dr. Loletha Carrow will let you know when to follow up with him  Continue your normal medications

## 2018-12-22 NOTE — Op Note (Signed)
[place] Patient Name: Philip [name] Procedure Date: 12/22/2018 11:58 AM MRN: [id number] Endoscopist: Poneto. Loletha Carrow , MD Age: 73 Referring MD:  Date of Birth: June 23, 1945 Gender: Male Account #: [id number] Procedure:                Upper GI endoscopy Indications:              Personal history of malignant esophageal neoplasm                            (adenocarcinoma arising from Barrett's esophagus -                            s/p treatment with CTX/XRT; post-treatment imaging                            showed excellent response) Medicines:                Monitored Anesthesia Care Procedure:                Pre-Anesthesia Assessment:                           - Prior to the procedure, a History and Physical                            was performed, and patient medications and                            allergies were reviewed. The patient's tolerance of                            previous anesthesia was also reviewed. The risks                            and benefits of the procedure and the sedation                            options and risks were discussed with the patient.                            All questions were answered, and informed consent                            was obtained. Prior Anticoagulants: The patient has                            taken no previous anticoagulant or antiplatelet                            agents except for aspirin. ASA Grade Assessment:                            III - A patient with severe systemic disease. After  reviewing the risks and benefits, the patient was                            deemed in satisfactory condition to undergo the                            procedure.                           After obtaining informed consent, the endoscope was                            passed under direct vision. Throughout the                            procedure, the patient's blood pressure, pulse, and                       oxygen saturations were monitored continuously. The                            Endoscope was introduced through the mouth, and                            advanced to the second part of duodenum. The upper                            GI endoscopy was accomplished without difficulty.                            The patient tolerated the procedure fairly well. Scope In: Scope Out: Findings:                 There were esophageal mucosal changes secondary to                            established short-segment Barrett's disease present                            at the gastroesophageal junction. The maximum                            longitudinal extent of these mucosal changes was 2                            cm in length (few tongues of salmon-colored mucosa).                           Localized mucosal changes characterized by                            erythema, increased vascularity, friability (with                            contact bleeding) and nodularity were found at the  gastroesophageal junction. Biopsies were taken with                            a cold forceps for histology.                           The exam of the esophagus was otherwise normal.                           Diffuse mucosal changes characterized by erythema                            and petechiae were found in the gastric fundus and                            in the proximal gastric body.                           Multiple diminutive sessile fundic gland polyps                            were found in the gastric body.                           Diffuse mucosal changes characterized by erosion                            and nodularity were found in the gastric antrum.                            Biopsies were taken from the antrum and distal body                            with a cold forceps for histology.                           The exam of the stomach was otherwise  normal.                           A single 6 mm submucosal nodule was found in the                            second portion of the duodenum.                           The exam of the duodenum was otherwise normal. Complications:            No immediate complications. Estimated Blood Loss:     Estimated blood loss was minimal. Impression:               - Esophageal mucosal changes secondary to                            established short-segment Barrett's disease.                           -  Erythematous, friable (with contact bleeding),                            nodular mucosa in the esophagus. Biopsied. May be                            inflammation from radiation.                           - Erythematous and petechial mucosa in the gastric                            fundus and gastric body. Appears likely related to                            radiation.                           - Multiple fundic gland polyps.                           - Eroded and nodular mucosa in the antrum. Biopsied.                           - Submucosal nodule found in the duodenum. Stable                            in size from prior exams. Recommendation:           - Patient has a contact number available for                            emergencies. The signs and symptoms of potential                            delayed complications were discussed with the                            patient. Return to normal activities tomorrow.                            Written discharge instructions were provided to the                            patient.                           - Resume previous diet.                           - Continue present medications.                           - Await pathology results. Henry L. Loletha Carrow, MD 12/22/2018 12:26:44 PM This report has been signed electronically.

## 2018-12-22 NOTE — Progress Notes (Signed)
Philip [name], Philip [name], Philip [name], Adon Gehlhausen checked in pt. Pt's states no medical or surgical changes since previsit or office visit.

## 2018-12-22 NOTE — Progress Notes (Signed)
Called to room to assist during endoscopic procedure.  Patient ID and intended procedure confirmed with present staff. Received instructions for my participation in the procedure from the performing physician.  

## 2018-12-25 ENCOUNTER — Telehealth: Payer: Self-pay | Admitting: *Deleted

## 2018-12-25 NOTE — Telephone Encounter (Signed)
  Follow up Call-  Call back number 12/22/2018 01/22/2018  Post procedure Call Back phone  # [phone]  Permission to leave phone message Yes Yes  Some recent data might be hidden     Patient questions:  Do you have a fever, pain , or abdominal swelling? No. Pain Score  0 *  Have you tolerated food without any problems? Yes.    Have you been able to return to your normal activities? Yes.    Do you have any questions about your discharge instructions: Diet   No. Medications  No. Follow up visit  No.  Do you have questions or concerns about your Care? No.  Actions: * If pain score is 4 or above: No action needed, pain <4.  1. Have you developed a fever since your procedure? no  2.   Have you had an respiratory symptoms (SOB or cough) since your procedure? no  3.   Have you tested positive for COVID 19 since your procedure no  4.   Have you had any family members/close contacts diagnosed with the COVID 19 since your procedure?  no   If yes to any of these questions please route to Joylene John, RN and Alphonsa Gin, Therapist, sports.

## 2019-01-30 ENCOUNTER — Encounter (HOSPITAL_BASED_OUTPATIENT_CLINIC_OR_DEPARTMENT_OTHER): Payer: Self-pay

## 2019-01-30 ENCOUNTER — Other Ambulatory Visit: Payer: Self-pay

## 2019-01-30 ENCOUNTER — Emergency Department (HOSPITAL_BASED_OUTPATIENT_CLINIC_OR_DEPARTMENT_OTHER): Payer: Medicare Other

## 2019-01-30 ENCOUNTER — Emergency Department (HOSPITAL_BASED_OUTPATIENT_CLINIC_OR_DEPARTMENT_OTHER)
Admission: EM | Admit: 2019-01-30 | Discharge: 2019-01-30 | Disposition: A | Discharge status: Home / Self Care | Payer: Medicare Other | Attending: Emergency Medicine | Admitting: Emergency Medicine

## 2019-01-30 DIAGNOSIS — J45909 Unspecified asthma, uncomplicated: Secondary | ICD-10-CM | POA: Insufficient documentation

## 2019-01-30 DIAGNOSIS — Z8501 Personal history of malignant neoplasm of esophagus: Secondary | ICD-10-CM | POA: Diagnosis not present

## 2019-01-30 DIAGNOSIS — Z7982 Long term (current) use of aspirin: Secondary | ICD-10-CM | POA: Diagnosis not present

## 2019-01-30 DIAGNOSIS — M25461 Effusion, right knee: Secondary | ICD-10-CM | POA: Insufficient documentation

## 2019-01-30 DIAGNOSIS — I10 Essential (primary) hypertension: Secondary | ICD-10-CM | POA: Diagnosis not present

## 2019-01-30 DIAGNOSIS — M25561 Pain in right knee: Secondary | ICD-10-CM | POA: Diagnosis present

## 2019-01-30 DIAGNOSIS — Z87891 Personal history of nicotine dependence: Secondary | ICD-10-CM | POA: Diagnosis not present

## 2019-01-30 DIAGNOSIS — M25469 Effusion, unspecified knee: Secondary | ICD-10-CM

## 2019-01-30 DIAGNOSIS — Z79899 Other long term (current) drug therapy: Secondary | ICD-10-CM | POA: Diagnosis not present

## 2019-01-30 NOTE — Discharge Instructions (Addendum)
KEEP LEG ELEVATED AND TRY TO REST IT AS MUCH AS POSSIBLE. APPLY ICE SEVERAL TIMES PER DAY AND USE KNEE SLEEVE FOR COMPRESSION. CONTACT ORTHOPEDICS CLINIC TO FOLLOW UP THIS WEEK. RETURN TO ER IF YOU HAVE WORSENING SWELLING, ANY REDNESS OR WARMTH OF KNEE, OR ANY FEVER/RASH.

## 2019-01-30 NOTE — ED Provider Notes (Signed)
[facility] EMERGENCY DEPARTMENT Provider Note   CSN: [id number] Arrival date & time: 01/30/19  1648     History   Chief Complaint Chief Complaint  Patient presents with  . Knee Pain    HPI Philip [name] is a 73 y.o. male.     73 year old male with past medical history including myasthenia gravis, OSA, hypertension, esophageal cancer, asthma who presents with right knee pain.  Approximately 4 days ago, the patient began having right knee pain and swelling.  He denies any specific injury is to his knee although he does note that he bends down a lot and sits on his knees sometimes when he is doing work in his garage.  He has not noticed any skin changes or warmth.  No fevers or recent illness.  He has been able to ambulate with a cane.  The history is provided by the patient.  Knee Pain   Past Medical History:  Diagnosis Date  . Abdominal hernia   . Anemia   . Asthma   . Barrett's esophagus   . Cancer (HCC)    Esophagel cancer  . Cataract    Bil surgery  . Cough    over 2 weeks  . Fluid retention in legs   . GERD (gastroesophageal reflux disease)   . H/O hiatal hernia   . Hypertension   . Myasthenia gravis ([place])   . Sleep apnea    don't use c-pap at home/ tried for several years  . SOB (shortness of breath)     Patient Active Problem List   Diagnosis Date Noted  . Anemia 07/24/2018  . ARF (acute renal failure) ([place]) 07/24/2018  . Dehydration   . Hypotension   . Urinary retention   . AKI (acute kidney injury) ([place]) 07/23/2018  . Hypertension 07/23/2018  . UTI (urinary tract infection) 07/23/2018  . Odynophagia 07/23/2018  . Hyponatremia 07/23/2018  . Hypercalcemia 07/23/2018  . Pre-operative cardiovascular examination 02/22/2018  . Shortness of breath 02/22/2018  . Malignant neoplasm of gastroesophageal junction ([place]) 02/01/2018  . Myasthenia gravis ([place]) 01/18/2017  . OSA on CPAP 01/18/2017  . Esophageal stricture 10/27/2016  . Dysphagia    . Abnormal esophagram   . ESOPHAGEAL REFLUX 05/08/2009  . BARRETTS ESOPHAGUS 05/08/2009  . DIVERTICULOSIS OF COLON 05/08/2009  . ABDOMINAL BLOATING 05/08/2009    Past Surgical History:  Procedure Laterality Date  . BACK SURGERY     5th lumbar  . BALLOON DILATION N/A 10/19/2016   Procedure: BALLOON DILATION;  Surgeon: Ladene Artist, MD;  Location: [facility];  Service: Endoscopy;  Laterality: N/A;  . COLONOSCOPY W/ POLYPECTOMY    . ESOPHAGOGASTRODUODENOSCOPY N/A 10/19/2016   Procedure: ESOPHAGOGASTRODUODENOSCOPY (EGD);  Surgeon: Ladene Artist, MD;  Location: [facility];  Service: Endoscopy;  Laterality: N/A;  . ESOPHAGOGASTRODUODENOSCOPY (EGD) WITH PROPOFOL N/A 02/12/2018   Procedure: ESOPHAGOGASTRODUODENOSCOPY (EGD) WITH PROPOFOL;  Surgeon: [name], MD;  Location: [facility];  Service: Gastroenterology;  Laterality: N/A;  . EUS N/A 02/12/2018   Procedure: UPPER ENDOSCOPIC ULTRASOUND (EUS) RADIAL;  Surgeon: [name], MD;  Location: [facility];  Service: Gastroenterology;  Laterality: N/A;  . EYE SURGERY Bilateral    cataract  . LUMBAR LAMINECTOMY     1980's        Home Medications    Prior to Admission medications   Medication Sig Start Date End Date Taking? Authorizing Provider  ALBUTEROL IN Inhale 2 puffs into the lungs every 4 (four) hours as needed (  shortness of breath or wheezing).     [provider]  amitriptyline (ELAVIL) 25 MG tablet Take 25 mg by mouth daily.  04/01/12   [provider]  aspirin EC 81 MG tablet Take 81 mg by mouth daily.    [provider]  atorvastatin (LIPITOR) 10 MG tablet Take 10 mg by mouth at bedtime.  03/22/16   [provider]  Esomeprazole Magnesium 20 MG TBEC Take 40 mg by mouth daily at 12 noon.     [provider]  Multiple Vitamin (MULTIVITAMIN) tablet Take 1 tablet by mouth daily.    [provider]  mycophenolate (CELLCEPT) 500 MG tablet Take  2 tablets (1,000 mg total) by mouth 2 (two) times daily. 12/10/18   Penumalli, Earlean Polka, MD  ondansetron (ZOFRAN) 8 MG tablet Take 1 tablet (8 mg total) by mouth 2 (two) times daily as needed for refractory nausea / vomiting. Start on day 3 after chemo. Patient not taking: Reported on 12/22/2018 05/29/18   Truitt Merle, MD  prochlorperazine (COMPAZINE) 10 MG tablet Take 1 tablet (10 mg total) by mouth every 6 (six) hours as needed (Nausea or vomiting). Patient not taking: Reported on 12/22/2018 05/29/18   Truitt Merle, MD  pyridostigmine (MESTINON) 60 MG tablet Take 0.5-1 tablets (30-60 mg total) by mouth 3 (three) times daily. 12/10/18   Penumalli, Earlean Polka, MD  sucralfate (CARAFATE) 1 g tablet TAKE ONE TABLET BY MOUTH FOUR TIMES A DAY WITH MEALS AND AT BEDTIME Patient not taking: Reported on 12/22/2018 12/10/18   Truitt Merle, MD  tamsulosin (FLOMAX) 0.4 MG CAPS capsule Take 1 capsule (0.4 mg total) by mouth daily after supper. 07/26/18   Hosie Poisson, MD  traMADol (ULTRAM) 50 MG tablet Take 50 mg by mouth every 6 (six) hours as needed for pain.    [provider]    Family History Family History  Problem Relation Age of Onset  . Heart disease Mother   . Lung cancer Father   . Colon cancer Neg Hx     Social History Social History   Tobacco Use  . Smoking status: Former Smoker    Packs/day: 1.00    Years: 7.00    Pack years: 7.00    Quit date: 12/21/1976    Years since quitting: 42.1  . Smokeless tobacco: Never Used  Substance Use Topics  . Alcohol use: Yes    Comment: occ  . Drug use: No     Allergies   Patient has no known allergies.   Review of Systems Review of Systems All other systems reviewed and are negative except that which was mentioned in HPI   Physical Exam Updated Vital Signs BP 119/74 (BP Location: Right Arm)   Pulse 76   Temp 98.6 F (37 C) (Oral)   Resp 16   Ht 6' (1.829 m)   Wt 125.9 kg   SpO2 100%   BMI 37.64 kg/m   Physical Exam Vitals signs  and nursing note reviewed.  Constitutional:      General: He is not in acute distress.    Appearance: He is well-developed.  HENT:     Head: Normocephalic and atraumatic.  Eyes:     Conjunctiva/sclera: Conjunctivae normal.  Neck:     Musculoskeletal: Neck supple.  Musculoskeletal:        General: Swelling and tenderness present.     Comments: Mild swelling R knee compared to L, suprapatellar tenderness with no skin changes; full ROM with mild  pain, no joint laxity  Skin:    General: Skin is warm and dry.     Capillary Refill: Capillary refill takes less than 2 seconds.     Findings: No erythema or rash.  Neurological:     Mental Status: He is alert and oriented to person, place, and time.  Psychiatric:        Judgment: Judgment normal.      ED Treatments / Results  Labs (all labs ordered are listed, but only abnormal results are displayed) Labs Reviewed - No data to display  EKG None  Radiology Dg Knee Complete 4 Views Right  Result Date: 01/30/2019 CLINICAL DATA:  73 year old male with right knee pain and swelling. EXAM: RIGHT KNEE - COMPLETE 4+ VIEW COMPARISON:  None. FINDINGS: There is no acute fracture or dislocation. Mild arthritic changes with chondrocalcinosis. There is a small suprapatellar effusion. The bones are mildly osteopenic. The soft tissues are unremarkable. IMPRESSION: 1. No acute fracture or dislocation. 2. Mild arthritic changes and a small suprapatellar effusion. Electronically Signed   By: Anner Crete M.D.   On: 01/30/2019 18:08    Procedures Procedures (including critical care time)  Medications Ordered in ED Medications - No data to display   Initial Impression / Assessment and Plan / ED Course  I have reviewed the triage vital signs and the nursing notes.  Pertinent  imaging results that were available during my care of the patient were reviewed by me and considered in my medical decision making (see chart for details).        X-ray  shows small suprapatellar effusion with no other acute findings. DDX includes osteoarthritis, gout, pseudogout, or much less likely septic joint.  He is on CellCept for myasthenia gravis and I explained that because of his immunocompromised state, I cannot fully rule out infection without arthrocentesis.  I explained the procedure in detail.  He stated that he would rather follow-up with orthopedics.  I reviewed risks of foregoing arthrocentesis including unidentified infection and he voiced understanding.  He understands red flag symptoms for which he should return to the ER.  Provided with knee sleeve and discussed supportive measures including ice and elevation.  Final Clinical Impressions(s) / ED Diagnoses   Final diagnoses:  Suprapatellar effusion of knee    ED Discharge Orders    None       Jehan Ranganathan, Wenda Overland, MD 01/30/19 2358

## 2019-01-30 NOTE — ED Triage Notes (Signed)
Pt c/o pain/swelling right knee x 4 days-denies injury-NAD-walking with own cane

## 2019-02-05 ENCOUNTER — Ambulatory Visit (INDEPENDENT_AMBULATORY_CARE_PROVIDER_SITE_OTHER): Payer: Medicare Other | Admitting: Orthopaedic Surgery

## 2019-02-05 ENCOUNTER — Encounter: Payer: Self-pay | Admitting: Orthopaedic Surgery

## 2019-02-05 VITALS — Ht 72.0 in | Wt 271.0 lb

## 2019-02-05 DIAGNOSIS — M1711 Unilateral primary osteoarthritis, right knee: Secondary | ICD-10-CM

## 2019-02-05 MED ORDER — LIDOCAINE HCL 1 % IJ SOLN
2.0000 mL | INTRAMUSCULAR | Status: AC | PRN
  Administered 2019-02-05: 16:00:00 2 mL

## 2019-02-05 MED ORDER — ETODOLAC 200 MG PO CAPS: 200.0000 mg | ORAL_CAPSULE | Freq: Three times a day (TID) | ORAL | 3 refills | Status: DC

## 2019-02-05 MED ORDER — BUPIVACAINE HCL 0.5 % IJ SOLN
2.0000 mL | INTRAMUSCULAR | Status: AC | PRN
  Administered 2019-02-05: 2 mL via INTRA_ARTICULAR

## 2019-02-05 MED ORDER — METHYLPREDNISOLONE ACETATE 40 MG/ML IJ SUSP
40.0000 mg | INTRAMUSCULAR | Status: AC | PRN
  Administered 2019-02-05: 16:00:00 40 mg via INTRA_ARTICULAR

## 2019-02-05 NOTE — Progress Notes (Signed)
Office Visit Note   Patient: Philip [name]           Date of Birth: [birth year]           MRN: [id number] Visit Date: 02/05/2019              Requested by: Philip Anchors, MD [address],  [place] [id number] PCP: Philip Anchors, MD   Assessment & Plan: Visit Diagnoses:  1. Primary osteoarthritis of right knee     Plan: Impression is left knee osteoarthritis exacerbation.  Injection was performed today.  Patient tolerated this well.  He has had good relief from etodolac in the past so we refilled this today.  Follow-up as needed.  Follow-Up Instructions: Return if symptoms worsen or fail to improve.   Orders:  Orders Placed This Encounter  Procedures  . Large Joint Inj   Meds ordered this encounter  Medications  . etodolac (LODINE) 200 MG capsule    Sig: Take 1 capsule (200 mg total) by mouth every 8 (eight) hours.    Dispense:  30 capsule    Refill:  3      Procedures: Large Joint Inj: R knee on 02/05/2019 4:11 PM Indications: pain Details: 22 G needle  Arthrogram: No  Medications: 40 mg methylPREDNISolone acetate 40 MG/ML; 2 mL lidocaine 1 %; 2 mL bupivacaine 0.5 % Consent was given by the patient. Patient was prepped and draped in the usual sterile fashion.       Clinical Data: No additional findings.   Subjective: Chief Complaint  Patient presents with  . Right Knee - Pain    Philip [name] is a 73 year old gentleman comes in for evaluation of right knee pain due to osteoarthritis.  He has had a history of right knee pain arthritis with effusion in the past.  He has responded well to previous cortisone injection.  Denies any injuries.  He is ambulate with a cane and using ice and compression wrap.  He states that he is pain with straightening his knee.  Denies any mechanical symptoms.  The pain is more of a dull throbbing constant pain.   Review of Systems  Constitutional: Negative.   All other systems reviewed and are negative.     Objective: Vital Signs: Ht 6' (1.829 m)   Wt 271 lb (122.9 kg)   BMI 36.75 kg/m   Physical Exam Vitals signs and nursing note reviewed.  Constitutional:      Appearance: He is well-developed.  HENT:     Head: Normocephalic and atraumatic.  Eyes:     Pupils: Pupils are equal, round, and reactive to light.  Neck:     Musculoskeletal: Neck supple.  Pulmonary:     Effort: Pulmonary effort is normal.  Abdominal:     Palpations: Abdomen is soft.  Musculoskeletal: Normal range of motion.  Skin:    General: Skin is warm.  Neurological:     Mental Status: He is alert and oriented to person, place, and time.  Psychiatric:        Behavior: Behavior normal.        Thought Content: Thought content normal.        Judgment: Judgment normal.     Ortho Exam Right knee exam shows a trace joint effusion.  He has a mild flexion contracture.  Collaterals and cruciates are stable.  Painful joint range of motion. Specialty Comments:  No specialty comments available.  Imaging: No results found.   PMFS History:  Patient Active Problem List   Diagnosis Date Noted  . Anemia 07/24/2018  . ARF (acute renal failure) ([place]) 07/24/2018  . Dehydration   . Hypotension   . Urinary retention   . AKI (acute kidney injury) ([place]) 07/23/2018  . Hypertension 07/23/2018  . UTI (urinary tract infection) 07/23/2018  . Odynophagia 07/23/2018  . Hyponatremia 07/23/2018  . Hypercalcemia 07/23/2018  . Pre-operative cardiovascular examination 02/22/2018  . Shortness of breath 02/22/2018  . Malignant neoplasm of gastroesophageal junction ([place]) 02/01/2018  . Myasthenia gravis ([place]) 01/18/2017  . OSA on CPAP 01/18/2017  . Esophageal stricture 10/27/2016  . Dysphagia   . Abnormal esophagram   . ESOPHAGEAL REFLUX 05/08/2009  . BARRETTS ESOPHAGUS 05/08/2009  . DIVERTICULOSIS OF COLON 05/08/2009  . ABDOMINAL BLOATING 05/08/2009   Past Medical History:  Diagnosis Date  . Abdominal hernia   . Anemia   .  Asthma   . Barrett's esophagus   . Cancer (HCC)    Esophagel cancer  . Cataract    Bil surgery  . Cough    over 2 weeks  . Fluid retention in legs   . GERD (gastroesophageal reflux disease)   . H/O hiatal hernia   . Hypertension   . Myasthenia gravis ([place])   . Sleep apnea    don't use c-pap at home/ tried for several years  . SOB (shortness of breath)     Family History  Problem Relation Age of Onset  . Heart disease Mother   . Lung cancer Father   . Colon cancer Neg Hx     Past Surgical History:  Procedure Laterality Date  . BACK SURGERY     5th lumbar  . BALLOON DILATION N/A 10/19/2016   Procedure: BALLOON DILATION;  Surgeon: Ladene Artist, MD;  Location: [facility];  Service: Endoscopy;  Laterality: N/A;  . COLONOSCOPY W/ POLYPECTOMY    . ESOPHAGOGASTRODUODENOSCOPY N/A 10/19/2016   Procedure: ESOPHAGOGASTRODUODENOSCOPY (EGD);  Surgeon: Ladene Artist, MD;  Location: [facility];  Service: Endoscopy;  Laterality: N/A;  . ESOPHAGOGASTRODUODENOSCOPY (EGD) WITH PROPOFOL N/A 02/12/2018   Procedure: ESOPHAGOGASTRODUODENOSCOPY (EGD) WITH PROPOFOL;  Surgeon: [name], MD;  Location: [facility];  Service: Gastroenterology;  Laterality: N/A;  . EUS N/A 02/12/2018   Procedure: UPPER ENDOSCOPIC ULTRASOUND (EUS) RADIAL;  Surgeon: [name], MD;  Location: [facility];  Service: Gastroenterology;  Laterality: N/A;  . EYE SURGERY Bilateral    cataract  . LUMBAR LAMINECTOMY     1980's   Social History   Occupational History    Comment: retired  Tobacco Use  . Smoking status: Former Smoker    Packs/day: 1.00    Years: 7.00    Pack years: 7.00    Quit date: 12/21/1976    Years since quitting: 42.1  . Smokeless tobacco: Never Used  Substance and Sexual Activity  . Alcohol use: Yes    Comment: occ  . Drug use: No  . Sexual activity: Not on file

## 2019-02-07 ENCOUNTER — Other Ambulatory Visit: Payer: Self-pay | Admitting: Hematology

## 2019-02-07 NOTE — Progress Notes (Signed)
[place]   [phone] Fax:([phone]) [phone]   Clinic Follow up Note   Patient Care Team: [name], MD as PCP - General (Family Medicine) Mansouraty, [name], MD as Consulting Physician (Gastroenterology) Zonia Kief, MD (Rehabilitation)  Date of Service:  02/11/2019  CHIEF COMPLAINT: F/u GEJ cancer  SUMMARY OF ONCOLOGIC HISTORY: Oncology History Overview Note  Cancer Staging Malignant neoplasm of gastroesophageal junction [facility]) Staging form: Esophagus - Adenocarcinoma, AJCC 8th Edition - Clinical stage from 02/26/2018: Stage IIB (cT2, cN0, cM0, G3) - Signed by [name], NP on 02/26/2018    Malignant neoplasm of gastroesophageal junction ([place])  01/22/2018 Initial Biopsy   Diagnosis Surgical [P], GE junction nodule BX - POORLY 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   01/22/2018 Procedure   COLONOSCOPY IMPRESSION - Redundant colon. - The examination was otherwise normal on direct and retroflexion views. - No specimens collected.  UPPER ENDOSCOPY IMPRESSION: - Esophageal mucosal changes secondary to established short-segment Barrett's disease. - Mucosal nodule found in the esophagus. Biopsied. - Erythematous mucosa in the prepyloric region of the stomach. - Submucosal nodule found in the duodenum.    02/01/2018 Initial Diagnosis   Malignant neoplasm of gastroesophageal junction ([place])   02/05/2018 Imaging   CT CAP IMPRESSION: 1. No esophageal primary identified. No findings of metastatic disease in the chest, abdomen, or pelvis. 2.  Aortic Atherosclerosis (ICD10-I70.0).   02/12/2018 Procedure   EUS Impression: - A mass was found in the distal esophagus into the gastroesophageal junction and extending to proximal cardia. A tissue diagnosis was obtained prior to this exam consistent with adenocarcinoma. This was staged at T2N0Mx, because of a loss of interface of the  muscularis propria as the distal GE Junction becomes the cardia and the mass moves into this region, but there are many more regions and images that suggest a T1 lesion into the submucosa.   05/29/2018 Imaging   NM PET Image Initial (PI) Skull Base To Thigh  IMPRESSION: 1. Hypermetabolic focus at the GE junction consistent with know adenocarcinoma. 2. No evidence of metastatic adenopathy in the mediastinum or upper abdomen. 3. No evidence of liver metastasis.  No pulmonary metastasis.   06/06/2018 -  Chemotherapy   Weeklycarboplatin and Taxol, with concurrent radiationstarting on 06/06/2018-07/10/18   06/06/2018 - 07/16/2018 Radiation Therapy   Concurrent chemoRT with Dr. Harvel Ricks on 06/06/2018-07/16/18   11/06/2018 PET scan   PET 11/06/18  IMPRESSION: 1. No residual hypermetabolic activity at the gastroesophageal junction. 2. No evidence of metastatic disease.   12/22/2018 Procedure   Upper Endoscopy by Dr. Loletha Carrow 12/22/18  IMPRESSION - Esophageal mucosal changes secondary to established short-segment Barrett's disease. - Erythematous, friable (with contact bleeding), nodular mucosa in the esophagus. Biopsied. May be inflammation from radiation. - Erythematous and petechial mucosa in the gastric fundus and gastric body. Appears likely related to radiation. - Multiple fundic gland polyps. - Eroded and nodular mucosa in the antrum. Biopsied. - Submucosal nodule found in the duodenum. Stable in size from prior exams.    12/22/2018 Pathology Results   Diagnosis 12/22/18 1. Surgical [P], random gastric BX - CHRONIC INACTIVE GASTRITIS. - THERE IS NO EVIDENCE OF HELICOBACTER PYLORI, DYSPLASIA, OR MALIGNANCY. - SEE COMMENT. 2. Surgical [P], esophagus, GE junction - GASTROESOPHAGEAL JUNCTION MUCOSA WITH MILD INFLAMMATION CONSISTENT WITH GASTROESOPHAGEAL REFLUX. - THERE IS NO EVIDENCE OF GOBLET CELL METAPLASIA, DYSPLASIA OR MALIGNANCY. - SEE COMMENT.      CURRENT THERAPY:   Surveillance  INTERVAL HISTORY:  Philip [name] is here for a follow up of GEJ cancer. He presents to the clinic alone. He notes he is doing well and able to eat well with no issues swallowing. He denies abdominal issues, cough, chest discomfort, new pain, or burning sensations. He feels his energy is adequate overall. He feels he can take care himself well. He still has chronic back pain which he sees pain specialist for. I reviewed his medication list with him. He is fine to stop Sucralfate.  He wonders if he is at risk for pancreatic cancer and if he needs to screen for it. He has not family h/o pancreatic cancer.     REVIEW OF SYSTEMS:   Constitutional: Denies fevers, chills or abnormal weight loss Eyes: Denies blurriness of vision Ears, nose, mouth, throat, and face: Denies mucositis or sore throat Respiratory: Denies cough, dyspnea or wheezes Cardiovascular: Denies palpitation, chest discomfort or lower extremity swelling Gastrointestinal:  Denies nausea, heartburn or change in bowel habits Skin: Denies abnormal skin rashes MSK: (+) Chronic back pain  Lymphatics: Denies new lymphadenopathy or easy bruising Neurological:Denies numbness, tingling or new weaknesses Behavioral/Psych: Mood is stable, no new changes  All other systems were reviewed with the patient and are negative.  MEDICAL HISTORY:  Past Medical History:  Diagnosis Date   Abdominal hernia    Anemia    Asthma    Barrett's esophagus    Cancer (HCC)    Esophagel cancer   Cataract    Bil surgery   Cough    over 2 weeks   Fluid retention in legs    GERD (gastroesophageal reflux disease)    H/O hiatal hernia    Hypertension    Myasthenia gravis ([place])    Sleep apnea    don't use c-pap at home/ tried for several years   SOB (shortness of breath)     SURGICAL HISTORY: Past Surgical History:  Procedure Laterality Date   BACK SURGERY     5th lumbar   BALLOON DILATION N/A 10/19/2016    Procedure: BALLOON DILATION;  Surgeon: Ladene Artist, MD;  Location: [facility];  Service: Endoscopy;  Laterality: N/A;   COLONOSCOPY W/ POLYPECTOMY     ESOPHAGOGASTRODUODENOSCOPY N/A 10/19/2016   Procedure: ESOPHAGOGASTRODUODENOSCOPY (EGD);  Surgeon: Ladene Artist, MD;  Location: [facility];  Service: Endoscopy;  Laterality: N/A;   ESOPHAGOGASTRODUODENOSCOPY (EGD) WITH PROPOFOL N/A 02/12/2018   Procedure: ESOPHAGOGASTRODUODENOSCOPY (EGD) WITH PROPOFOL;  Surgeon: [name], MD;  Location: [place];  Service: Gastroenterology;  Laterality: N/A;   EUS N/A 02/12/2018   Procedure: UPPER ENDOSCOPIC ULTRASOUND (EUS) RADIAL;  Surgeon: Irving Copas., MD;  Location: [place];  Service: Gastroenterology;  Laterality: N/A;   EYE SURGERY Bilateral    cataract   LUMBAR LAMINECTOMY     1980's    I have reviewed the social history and family history with the patient and they are unchanged from previous note.  ALLERGIES:  has No Known Allergies.  MEDICATIONS:  Current Outpatient Medications  Medication Sig Dispense Refill   ALBUTEROL IN Inhale 2 puffs into the lungs every 4 (four) hours as needed (shortness of breath or wheezing).      amitriptyline (ELAVIL) 25 MG tablet Take 25 mg by mouth daily.      aspirin EC 81 MG tablet Take 81 mg by mouth daily.     atorvastatin (LIPITOR) 10 MG tablet Take 10 mg by mouth at bedtime.      Esomeprazole Magnesium 20  MG TBEC Take 40 mg by mouth daily at 12 noon.      etodolac (LODINE) 200 MG capsule Take 1 capsule (200 mg total) by mouth every 8 (eight) hours. 30 capsule 3   Multiple Vitamin (MULTIVITAMIN) tablet Take 1 tablet by mouth daily.     mycophenolate (CELLCEPT) 500 MG tablet Take 2 tablets (1,000 mg total) by mouth 2 (two) times daily. 120 tablet 12   pyridostigmine (MESTINON) 60 MG tablet Take 0.5-1 tablets (30-60 mg total) by mouth 3 (three) times daily. 90 tablet 12   sucralfate (CARAFATE) 1 g  tablet TAKE ONE TABLET BY MOUTH FOUR TIMES A DAY WITH MEALS AND AT BEDTIME 120 tablet 0   tamsulosin (FLOMAX) 0.4 MG CAPS capsule Take 1 capsule (0.4 mg total) by mouth daily after supper. 30 capsule 0   traMADol (ULTRAM) 50 MG tablet Take 50 mg by mouth every 6 (six) hours as needed for pain.     ondansetron (ZOFRAN) 8 MG tablet Take 1 tablet (8 mg total) by mouth 2 (two) times daily as needed for refractory nausea / vomiting. Start on day 3 after chemo. (Patient not taking: Reported on 12/22/2018) 30 tablet 1   prochlorperazine (COMPAZINE) 10 MG tablet Take 1 tablet (10 mg total) by mouth every 6 (six) hours as needed (Nausea or vomiting). (Patient not taking: Reported on 12/22/2018) 30 tablet 1   No current facility-administered medications for this visit.     PHYSICAL EXAMINATION: ECOG PERFORMANCE STATUS: 1 - Symptomatic but completely ambulatory  Vitals:   02/11/19 1048  BP: (!) 120/56  Pulse: 84  Resp: 17  Temp: 98 F (36.7 C)  SpO2: 97%   Filed Weights   02/11/19 1048  Weight: 285 lb (129.3 kg)    GENERAL:alert, no distress and comfortable SKIN: skin color, texture, turgor are normal, no rashes or significant lesions EYES: normal, Conjunctiva are pink and non-injected, sclera clear  NECK: supple, thyroid normal size, non-tender, without nodularity LYMPH:  no palpable lymphadenopathy in the cervical, axillary  LUNGS: clear to auscultation and percussion with normal breathing effort HEART: regular rate & rhythm and no murmurs and no lower extremity edema ABDOMEN:abdomen soft, non-tender and normal bowel sounds Musculoskeletal:no cyanosis of digits and no clubbing  NEURO: alert & oriented x 3 with fluent speech, no focal motor/sensory deficits  LABORATORY DATA:  I have reviewed the data as listed CBC Latest Ref Rng & Units 02/11/2019 11/06/2018 09/13/2018  WBC 4.0 - 10.5 K/uL 6.3 5.8 6.0  Hemoglobin 13.0 - 17.0 g/dL 12.3(L) 13.1 10.0(L)  Hematocrit 39.0 - 52.0 % 37.4(L)  39.2 30.9(L)  Platelets 150 - 400 K/uL 184 182 180     CMP Latest Ref Rng & Units 02/11/2019 11/06/2018 09/13/2018  Glucose 70 - 99 mg/dL 100(H) 102(H) 89  BUN 8 - 23 mg/dL 19 12 21   Creatinine 0.61 - 1.24 mg/dL 1.02 0.86 0.91  Sodium 135 - 145 mmol/L 138 136 134(L)  Potassium 3.5 - 5.1 mmol/L 4.7 4.1 4.2  Chloride 98 - 111 mmol/L 107 107 106  CO2 22 - 32 mmol/L 23 23 21(L)  Calcium 8.9 - 10.3 mg/dL 10.7(H) 10.8(H) 10.1  Total Protein 6.5 - 8.1 g/dL 6.1(L) 6.3(L) 5.8(L)  Total Bilirubin 0.3 - 1.2 mg/dL 0.5 0.6 0.5  Alkaline Phos 38 - 126 U/L 80 78 82  AST 15 - 41 U/L 15 19 17   ALT 0 - 44 U/L 19 17 15       RADIOGRAPHIC STUDIES: I have personally reviewed  the radiological images as listed and agreed with the findings in the report. No results found.   ASSESSMENT & PLAN:  Philip [name] is a 73 y.o. male with   1. 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 -Due to his age and comorbidities, he is not a good candidate for surgery.He completed concurrent chemoRT with CT. He recovered well.  -I personally reviewed and discussed his PET from 11/06/18 with pt which shows he has had complete response from chemoRT, no residual or metastatic disease.  -His repeat upper endoscopy from 12/22/18 shows no evidence of cancer. Given he did not have surgery his risk of recurrence is more than 50%. Will monitor every 6 months with endoscopy (06/2019), I sent a message to Dr. Loletha Carrow  -Plan to repeat CT scan next year around May.  -I discussed if his cancer recurs surgery is an option of only local recurrence, otherwise chemo and systemic therapy is available.  -He is clinically doing well and stable. Labs reviewed, CBC and CMP WNL except Hg 12.3, calcium 10.7. Physical exam unremarkable.  -F/u in 3 months. Will order surveillance CT scan at that time.   2.Dysphagia  and odynophagia -resolved now   3.Chronic back pain  -Takestramadol as needed -Continue toF/uwith pain specialist -I will send clearance note to his orthopedic surgeon, Dr Maia Petties [facility] for him to receive steroid injections in spine.   4. HTN -was onlisinopril. Due to hypotension Ipreviouslyinstructed him to hold lisinopriland add salt to diet -F/u with PCP -BP120/56today (02/11/19).   5.Myasthenia gravis -Oncellcept 1000mg BIDfor longer term immunosuppression -He is at high risk for infections due topriorchemo -Stablewith fatigue  6. Hypercalcemia,primary hyperparathyroidism -Ipreviouslyrecommend low-dose calcium and vitamin D supplement,he will take multivitaminswhich contains low dose calcium. I advised him to avoid high dose calcium.  -Labssupporting primary hyperparathyroidism. I encouraged him to see his PCP about this for treatment.  -Ca at10.7today (02/11/19). -f/u with PCP   Plan -He is clinically doing well  -Endoscopy with Dr Loletha Carrow in 06/2018, I communicated with Dr. Loletha Carrow  -He is cleared to have epidural injection, will copy note to Dr. Maia Petties  -Lab and f/u in 3 months, will order CT scan on next visit   No problem-specific Assessment & Plan notes found for this encounter.   No orders of the defined types were placed in this encounter.  All questions were answered. The patient knows to call the clinic with any problems, questions or concerns. No barriers to learning was detected. I spent 20 minutes counseling the patient face to face. The total time spent in the appointment was 25 minutes and more than 50% was on counseling and review of test results     Truitt Merle, MD 02/11/2019   I, Joslyn Devon, am acting as scribe for Truitt Merle, MD.   I have reviewed the above documentation for accuracy and completeness, and I agree with the above.

## 2019-02-08 ENCOUNTER — Other Ambulatory Visit: Payer: Self-pay | Admitting: *Deleted

## 2019-02-08 ENCOUNTER — Telehealth: Payer: Self-pay

## 2019-02-08 DIAGNOSIS — C160 Malignant neoplasm of cardia: Secondary | ICD-10-CM

## 2019-02-08 NOTE — Telephone Encounter (Signed)
Patient left a voicemail stating he "needs to be cleared by my oncologist to have epidural steroid shots from my back doctor. I have an appointment with Dr. Burr Medico Monday, but I thought I'd call to give you all a heads up". Will make Dr. Burr Medico aware so she can address it with patient during their visit

## 2019-02-11 ENCOUNTER — Inpatient Hospital Stay: Payer: Medicare Other | Attending: Nurse Practitioner

## 2019-02-11 ENCOUNTER — Inpatient Hospital Stay (HOSPITAL_BASED_OUTPATIENT_CLINIC_OR_DEPARTMENT_OTHER): Payer: Medicare Other | Admitting: Hematology

## 2019-02-11 ENCOUNTER — Telehealth: Payer: Self-pay | Admitting: Hematology

## 2019-02-11 ENCOUNTER — Encounter: Payer: Self-pay | Admitting: Hematology

## 2019-02-11 ENCOUNTER — Telehealth: Payer: Self-pay | Admitting: Gastroenterology

## 2019-02-11 ENCOUNTER — Other Ambulatory Visit: Payer: Self-pay

## 2019-02-11 VITALS — BP 120/56 | HR 84 | Temp 98.0°F | Resp 17 | Ht 72.0 in | Wt 285.0 lb

## 2019-02-11 DIAGNOSIS — M549 Dorsalgia, unspecified: Secondary | ICD-10-CM | POA: Diagnosis not present

## 2019-02-11 DIAGNOSIS — Z79899 Other long term (current) drug therapy: Secondary | ICD-10-CM | POA: Insufficient documentation

## 2019-02-11 DIAGNOSIS — E210 Primary hyperparathyroidism: Secondary | ICD-10-CM | POA: Diagnosis not present

## 2019-02-11 DIAGNOSIS — G7000 Myasthenia gravis without (acute) exacerbation: Secondary | ICD-10-CM | POA: Insufficient documentation

## 2019-02-11 DIAGNOSIS — J45909 Unspecified asthma, uncomplicated: Secondary | ICD-10-CM | POA: Diagnosis not present

## 2019-02-11 DIAGNOSIS — Z923 Personal history of irradiation: Secondary | ICD-10-CM | POA: Diagnosis not present

## 2019-02-11 DIAGNOSIS — G4730 Sleep apnea, unspecified: Secondary | ICD-10-CM | POA: Insufficient documentation

## 2019-02-11 DIAGNOSIS — Z9221 Personal history of antineoplastic chemotherapy: Secondary | ICD-10-CM | POA: Diagnosis not present

## 2019-02-11 DIAGNOSIS — Z7982 Long term (current) use of aspirin: Secondary | ICD-10-CM | POA: Diagnosis not present

## 2019-02-11 DIAGNOSIS — I10 Essential (primary) hypertension: Secondary | ICD-10-CM | POA: Diagnosis not present

## 2019-02-11 DIAGNOSIS — C160 Malignant neoplasm of cardia: Secondary | ICD-10-CM | POA: Diagnosis present

## 2019-02-11 LAB — CBC WITH DIFFERENTIAL (CANCER CENTER ONLY)
Abs Immature Granulocytes: 0.03 10*3/uL (ref 0.00–0.07)
Basophils Absolute: 0.1 10*3/uL (ref 0.0–0.1)
Basophils Relative: 1 %
Eosinophils Absolute: 0.2 10*3/uL (ref 0.0–0.5)
Eosinophils Relative: 4 %
HCT: 37.4 % — ABNORMAL LOW (ref 39.0–52.0)
Hemoglobin: 12.3 g/dL — ABNORMAL LOW (ref 13.0–17.0)
Immature Granulocytes: 1 %
Lymphocytes Relative: 15 %
Lymphs Abs: 0.9 10*3/uL (ref 0.7–4.0)
MCH: 32.3 pg (ref 26.0–34.0)
MCHC: 32.9 g/dL (ref 30.0–36.0)
MCV: 98.2 fL (ref 80.0–100.0)
Monocytes Absolute: 0.7 10*3/uL (ref 0.1–1.0)
Monocytes Relative: 10 %
Neutro Abs: 4.4 10*3/uL (ref 1.7–7.7)
Neutrophils Relative %: 69 %
Platelet Count: 184 10*3/uL (ref 150–400)
RBC: 3.81 MIL/uL — ABNORMAL LOW (ref 4.22–5.81)
RDW: 14.1 % (ref 11.5–15.5)
WBC Count: 6.3 10*3/uL (ref 4.0–10.5)
nRBC: 0 % (ref 0.0–0.2)

## 2019-02-11 LAB — CMP (CANCER CENTER ONLY)
ALT: 19 U/L (ref 0–44)
AST: 15 U/L (ref 15–41)
Albumin: 3.7 g/dL (ref 3.5–5.0)
Alkaline Phosphatase: 80 U/L (ref 38–126)
Anion gap: 8 (ref 5–15)
BUN: 19 mg/dL (ref 8–23)
CO2: 23 mmol/L (ref 22–32)
Calcium: 10.7 mg/dL — ABNORMAL HIGH (ref 8.9–10.3)
Chloride: 107 mmol/L (ref 98–111)
Creatinine: 1.02 mg/dL (ref 0.61–1.24)
GFR, Est AFR Am: >60 mL/min (ref >60)
GFR, Estimated: >60 mL/min (ref >60)
Glucose, Bld: 100 mg/dL — ABNORMAL HIGH (ref 70–99)
Potassium: 4.7 mmol/L (ref 3.5–5.1)
Sodium: 138 mmol/L (ref 135–145)
Total Bilirubin: 0.5 mg/dL (ref 0.3–1.2)
Total Protein: 6.1 g/dL — ABNORMAL LOW (ref 6.5–8.1)

## 2019-02-11 NOTE — Telephone Encounter (Signed)
Scheduled appt per 10/12 los. ° °Sent a staff message to get a calendar mailed out. °

## 2019-02-11 NOTE — Telephone Encounter (Signed)
6 month EGD for GEJ cancer surveillance recall edited and updated in Epic for Feb 2021.

## 2019-02-11 NOTE — Telephone Encounter (Signed)
Sounds reasonable - we will put him in for recall EGD Feb 2021.  Thanks.   Brittani,    Please place the recall.    -- HD

## 2019-02-11 NOTE — Telephone Encounter (Signed)
Dr. Burr Medico,    Your note from today is not yet in the chart.  Can you elaborate on this request?  -  - Wilfrid Lund, MD    [facility]

## 2019-02-11 NOTE — Telephone Encounter (Signed)
Dr. Loletha Carrow, since he did not have surgery for his GEJ cancer, only received chemo and radiation, he is at very high risk for recurrence. His last EGD showed no residual cancer on 12/22/2018 , and I would like him to have a repeated EGD in 6 months which will be Feb 2021, for survillance. What do you think?   Thanks,  Philip Merle MD

## 2019-02-11 NOTE — Telephone Encounter (Signed)
Patient has a recall in for a repeat EGD 12/2019. Per the patient, Dr. Burr Medico wants him to have on w/i the next 6 months. Please advise on scheduling the patient for a repeat procedure.

## 2019-02-12 ENCOUNTER — Telehealth: Payer: Self-pay

## 2019-02-12 ENCOUNTER — Encounter: Payer: Self-pay | Admitting: Hematology

## 2019-02-12 LAB — LYME AB/WESTERN BLOT REFLEX
B burgdorferi Ab IgG+IgM: <0.91 {ISR} (ref 0.00–0.90)
LYME DISEASE AB, QUANT, IGM: <0.8 index (ref 0.00–0.79)

## 2019-02-12 NOTE — Telephone Encounter (Signed)
Faxed OV note from 10/12 and note clearing him for epidural injections to Dr. Zonia Kief at fax [phone]. Sent cover sheet for scan to chart.

## 2019-02-12 NOTE — Telephone Encounter (Signed)
-----   Message from Truitt Merle, MD sent at 02/12/2019  9:09 AM EDT ----- Philip [name],  Please fax my last office note from yesterday to his orthopedic Dr. Maia Petties. On the fax cover sheet, please indicate that he is cleared to have epidural injection   Thanks   Krista Blue

## 2019-02-12 NOTE — Telephone Encounter (Signed)
Spoke with patient regarding lab results.  Per Dr. Burr Medico notified him lyme disease test was negative.  Calcium slightly high due to hyperparathyroidism.  I have instructed him to take low dose calcium and vitamin D such as a MVI.  Her verbalized an understanding.

## 2019-02-12 NOTE — Telephone Encounter (Signed)
-----   Message from Truitt Merle, MD sent at 02/12/2019  2:39 PM EDT ----- Please let pt know his lab results, lyme disease was negative, calcium slightly high due to his hyperparathyroidusm, encourage him to take low dose calcium and vitd such as MVI.   Thanks   Truitt Merle

## 2019-04-10 ENCOUNTER — Other Ambulatory Visit: Payer: Self-pay | Admitting: Orthopaedic Surgery

## 2019-05-09 ENCOUNTER — Other Ambulatory Visit: Payer: Self-pay

## 2019-05-09 DIAGNOSIS — C160 Malignant neoplasm of cardia: Secondary | ICD-10-CM

## 2019-05-10 NOTE — Progress Notes (Signed)
[facility]   [phone] Fax:(336) [phone]   Clinic Follow up Note   Patient Care Team: [name], MD as PCP - General (Family Medicine) Mansouraty, [name], MD as Consulting Physician (Gastroenterology) Zonia Kief, MD (Rehabilitation)  Date of Service:  05/13/2019  CHIEF COMPLAINT: F/u GEJ cancer  SUMMARY OF ONCOLOGIC HISTORY: Oncology History Overview Note  Cancer Staging Malignant neoplasm of gastroesophageal junction [facility]) Staging form: Esophagus - Adenocarcinoma, AJCC 8th Edition - Clinical stage from 02/26/2018: Stage IIB (cT2, cN0, cM0, G3) - Signed by [name], NP on 02/26/2018    Malignant neoplasm of gastroesophageal junction ([place])  01/22/2018 Initial Biopsy   Diagnosis Surgical [P], GE junction nodule BX - POORLY 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   01/22/2018 Procedure   COLONOSCOPY IMPRESSION - Redundant colon. - The examination was otherwise normal on direct and retroflexion views. - No specimens collected.  UPPER ENDOSCOPY IMPRESSION: - Esophageal mucosal changes secondary to established short-segment Barrett's disease. - Mucosal nodule found in the esophagus. Biopsied. - Erythematous mucosa in the prepyloric region of the stomach. - Submucosal nodule found in the duodenum.    02/01/2018 Initial Diagnosis   Malignant neoplasm of gastroesophageal junction ([place])   02/05/2018 Imaging   CT CAP IMPRESSION: 1. No esophageal primary identified. No findings of metastatic disease in the chest, abdomen, or pelvis. 2.  Aortic Atherosclerosis (ICD10-I70.0).   02/12/2018 Procedure   EUS Impression: - A mass was found in the distal esophagus into the gastroesophageal junction and extending to proximal cardia. A tissue diagnosis was obtained prior to this exam consistent with adenocarcinoma. This was staged at T2N0Mx, because of a loss of interface of the  muscularis propria as the distal GE Junction becomes the cardia and the mass moves into this region, but there are many more regions and images that suggest a T1 lesion into the submucosa.   05/29/2018 Imaging   NM PET Image Initial (PI) Skull Base To Thigh  IMPRESSION: 1. Hypermetabolic focus at the GE junction consistent with know adenocarcinoma. 2. No evidence of metastatic adenopathy in the mediastinum or upper abdomen. 3. No evidence of liver metastasis.  No pulmonary metastasis.   06/06/2018 -  Chemotherapy   Weeklycarboplatin and Taxol, with concurrent radiationstarting on 06/06/2018-07/10/18   06/06/2018 - 07/16/2018 Radiation Therapy   Concurrent chemoRT with Dr. Harvel Ricks on 06/06/2018-07/16/18   11/06/2018 PET scan   PET 11/06/18  IMPRESSION: 1. No residual hypermetabolic activity at the gastroesophageal junction. 2. No evidence of metastatic disease.   12/22/2018 Procedure   Upper Endoscopy by Dr. Loletha Carrow 12/22/18  IMPRESSION - Esophageal mucosal changes secondary to established short-segment Barrett's disease. - Erythematous, friable (with contact bleeding), nodular mucosa in the esophagus. Biopsied. May be inflammation from radiation. - Erythematous and petechial mucosa in the gastric fundus and gastric body. Appears likely related to radiation. - Multiple fundic gland polyps. - Eroded and nodular mucosa in the antrum. Biopsied. - Submucosal nodule found in the duodenum. Stable in size from prior exams.    12/22/2018 Pathology Results   Diagnosis 12/22/18 1. Surgical [P], random gastric BX - CHRONIC INACTIVE GASTRITIS. - THERE IS NO EVIDENCE OF HELICOBACTER PYLORI, DYSPLASIA, OR MALIGNANCY. - SEE COMMENT. 2. Surgical [P], esophagus, GE junction - GASTROESOPHAGEAL JUNCTION MUCOSA WITH MILD INFLAMMATION CONSISTENT WITH GASTROESOPHAGEAL REFLUX. - THERE IS NO EVIDENCE OF GOBLET CELL METAPLASIA, DYSPLASIA OR MALIGNANCY. - SEE COMMENT.      CURRENT THERAPY:   Surveillance  INTERVAL HISTORY:  Philip [name] is here for a follow up of GEJ cancer. He presents to the clinic alone. He notes he is doing well and stable. He has stable chronic back pain managed by his pain specialist. He notes no new pain and is adequately swallowing. He notes normal BMs. He is overall doing well with no concerns. He is interested in COVID vaccine when available to him.    REVIEW OF SYSTEMS:   Constitutional: Denies fevers, chills or abnormal weight loss Eyes: Denies blurriness of vision Ears, nose, mouth, throat, and face: Denies mucositis or sore throat Respiratory: Denies cough, dyspnea or wheezes Cardiovascular: Denies palpitation, chest discomfort or lower extremity swelling Gastrointestinal:  Denies nausea, heartburn or change in bowel habits Skin: Denies abnormal skin rashes MSK: (+) Stable and chronic back pain  Lymphatics: Denies new lymphadenopathy or easy bruising Neurological:Denies numbness, tingling or new weaknesses Behavioral/Psych: Mood is stable, no new changes  All other systems were reviewed with the patient and are negative.  MEDICAL HISTORY:  Past Medical History:  Diagnosis Date  . Abdominal hernia   . Anemia   . Asthma   . Barrett's esophagus   . Cancer (HCC)    Esophagel cancer  . Cataract    Bil surgery  . Cough    over 2 weeks  . Fluid retention in legs   . GERD (gastroesophageal reflux disease)   . H/O hiatal hernia   . Hypertension   . Myasthenia gravis ([place])   . Sleep apnea    don't use c-pap at home/ tried for several years  . SOB (shortness of breath)     SURGICAL HISTORY: Past Surgical History:  Procedure Laterality Date  . BACK SURGERY     5th lumbar  . BALLOON DILATION N/A 10/19/2016   Procedure: BALLOON DILATION;  Surgeon: Ladene Artist, MD;  Location: [facility];  Service: Endoscopy;  Laterality: N/A;  . COLONOSCOPY W/ POLYPECTOMY    . ESOPHAGOGASTRODUODENOSCOPY N/A 10/19/2016   Procedure:  ESOPHAGOGASTRODUODENOSCOPY (EGD);  Surgeon: Ladene Artist, MD;  Location: [facility];  Service: Endoscopy;  Laterality: N/A;  . ESOPHAGOGASTRODUODENOSCOPY (EGD) WITH PROPOFOL N/A 02/12/2018   Procedure: ESOPHAGOGASTRODUODENOSCOPY (EGD) WITH PROPOFOL;  Surgeon: [name], MD;  Location: [facility];  Service: Gastroenterology;  Laterality: N/A;  . EUS N/A 02/12/2018   Procedure: UPPER ENDOSCOPIC ULTRASOUND (EUS) RADIAL;  Surgeon: [name], MD;  Location: [facility];  Service: Gastroenterology;  Laterality: N/A;  . EYE SURGERY Bilateral    cataract  . LUMBAR LAMINECTOMY     1980's    I have reviewed the social history and family history with the patient and they are unchanged from previous note.  ALLERGIES:  has No Known Allergies.  MEDICATIONS:  Current Outpatient Medications  Medication Sig Dispense Refill  . ALBUTEROL IN Inhale 2 puffs into the lungs every 4 (four) hours as needed (shortness of breath or wheezing).     [name] amitriptyline (ELAVIL) 25 MG tablet Take 25 mg by mouth daily.     [name] aspirin EC 81 MG tablet Take 81 mg by mouth daily.    [name] atorvastatin (LIPITOR) 10 MG tablet Take 10 mg by mouth at bedtime.     . Esomeprazole Magnesium 20 MG TBEC Take 40 mg by mouth daily at 12 noon.     . etodolac (LODINE) 200 MG capsule TAKE ONE CAPSULE BY MOUTH EVERY 8 HOURS 30 capsule 2  . Multiple Vitamin (MULTIVITAMIN) tablet Take 1 tablet by  mouth daily.    . mycophenolate (CELLCEPT) 500 MG tablet Take 2 tablets (1,000 mg total) by mouth 2 (two) times daily. 120 tablet 12  . ondansetron (ZOFRAN) 8 MG tablet Take 1 tablet (8 mg total) by mouth 2 (two) times daily as needed for refractory nausea / vomiting. Start on day 3 after chemo. 30 tablet 1  . prochlorperazine (COMPAZINE) 10 MG tablet Take 1 tablet (10 mg total) by mouth every 6 (six) hours as needed (Nausea or vomiting). 30 tablet 1  . pyridostigmine (MESTINON) 60 MG tablet Take 0.5-1 tablets (30-60 mg  total) by mouth 3 (three) times daily. 90 tablet 12  . sucralfate (CARAFATE) 1 g tablet TAKE ONE TABLET BY MOUTH FOUR TIMES A DAY WITH MEALS AND AT BEDTIME 120 tablet 0  . tamsulosin (FLOMAX) 0.4 MG CAPS capsule Take 1 capsule (0.4 mg total) by mouth daily after supper. 30 capsule 0  . traMADol (ULTRAM) 50 MG tablet Take 50 mg by mouth every 6 (six) hours as needed for pain.     No current facility-administered medications for this visit.    PHYSICAL EXAMINATION: ECOG PERFORMANCE STATUS: 0 - Asymptomatic  Vitals:   05/13/19 0943  BP: 129/60  Pulse: (!) 45  Resp: 17  Temp: 98.5 F (36.9 C)  SpO2: 100%   Filed Weights   05/13/19 0943  Weight: 289 lb 14.4 oz (131.5 kg)    GENERAL:alert, no distress and comfortable SKIN: skin color, texture, turgor are normal, no rashes or significant lesions EYES: normal, Conjunctiva are pink and non-injected, sclera clear  NECK: supple, thyroid normal size, non-tender, without nodularity LYMPH:  no palpable lymphadenopathy in the cervical, axillary  LUNGS: clear to auscultation and percussion with normal breathing effort HEART: regular rate & rhythm and no murmurs and no lower extremity edema ABDOMEN:abdomen soft, non-tender and normal bowel sounds Musculoskeletal:no cyanosis of digits and no clubbing  NEURO: alert & oriented x 3 with fluent speech, no focal motor/sensory deficits  LABORATORY DATA:  I have reviewed the data as listed CBC Latest Ref Rng & Units 05/13/2019 02/11/2019 11/06/2018  WBC 4.0 - 10.5 K/uL 6.4 6.3 5.8  Hemoglobin 13.0 - 17.0 g/dL 13.5 12.3(L) 13.1  Hematocrit 39.0 - 52.0 % 40.1 37.4(L) 39.2  Platelets 150 - 400 K/uL 193 184 182     CMP Latest Ref Rng & Units 05/13/2019 02/11/2019 11/06/2018  Glucose 70 - 99 mg/dL 116(H) 100(H) 102(H)  BUN 8 - 23 mg/dL 21 19 12   Creatinine 0.61 - 1.24 mg/dL 0.90 1.02 0.86  Sodium 135 - 145 mmol/L 137 138 136  Potassium 3.5 - 5.1 mmol/L 4.3 4.7 4.1  Chloride 98 - 111 mmol/L 108 107  107  CO2 22 - 32 mmol/L 22 23 23   Calcium 8.9 - 10.3 mg/dL 10.5(H) 10.7(H) 10.8(H)  Total Protein 6.5 - 8.1 g/dL 6.2(L) 6.1(L) 6.3(L)  Total Bilirubin 0.3 - 1.2 mg/dL 0.8 0.5 0.6  Alkaline Phos 38 - 126 U/L 83 80 78  AST 15 - 41 U/L 14(L) 15 19  ALT 0 - 44 U/L 16 19 17       RADIOGRAPHIC STUDIES: I have personally reviewed the radiological images as listed and agreed with the findings in the report. No results found.   ASSESSMENT & PLAN:  Philip [name] is a 74 y.o. male with   1. 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 -Due to his age  and comorbidities, he was felt to be a poor candidate for surgery. He was previously seen by thoracic surgery Dr. Pia Mau. -He completed concurrent chemoRT with CT.Given he did not have surgery his risk of recurrence is more than 50%. Will continue with surveillance.  -He is clinically doing well and stable. No new concerning pain, eating adequately without difficultly. 12/2018 endoscopy with Dr Loletha Carrow was benign, NED. Labs reviewed, CBC and CMP WNL except BG 116, Ca 10.5. CEA is still pending. Physical exam unremarkable.  -Will continue with surveillance. Next CT scan in 10/2019, likely last scan. Will repeat Endoscopy in the future if needed.  -F/u in 4 months -He is fine to have COVID vaccine when available to him. I encouraged him to continue COVID precautions.   3.Chronic back pain  -Takestramadol as needed -Continue toF/uwith pain specialist -[facility]   4. HTN -Was onlisinopril. Due to hypotension Ipreviouslyinstructed him to hold lisinopriland add salt to diet -F/u with PCP. Has been normal lately.   5.Myasthenia gravis -Oncellcept 1000mg BIDfor longer  term immunosuppression -He is at high risk for infections due topriorchemo -Stablewith fatigue  6. Hypercalcemia,primary hyperparathyroidism -He takes Vitamin D. He is fine to take multivitamin that contains low dose calcium. I advised him to avoid high dose calcium. -Labs havesupported primary hyperparathyroidism. F/u wit h PCP about this.    Plan -He is clinically doing well  -Lab and F/u in 4 months    No problem-specific Assessment & Plan notes found for this encounter.   No orders of the defined types were placed in this encounter.  All questions were answered. The patient knows to call the clinic with any problems, questions or concerns. No barriers to learning was detected. The total time spent in the appointment was 25 minutes.     Truitt Merle, MD 05/13/2019   I, Joslyn Devon, am acting as scribe for Truitt Merle, MD.   I have reviewed the above documentation for accuracy and completeness, and I agree with the above.

## 2019-05-13 ENCOUNTER — Inpatient Hospital Stay (HOSPITAL_BASED_OUTPATIENT_CLINIC_OR_DEPARTMENT_OTHER): Payer: Medicare Other | Admitting: Hematology

## 2019-05-13 ENCOUNTER — Encounter: Payer: Self-pay | Admitting: Hematology

## 2019-05-13 ENCOUNTER — Other Ambulatory Visit: Payer: Self-pay

## 2019-05-13 ENCOUNTER — Telehealth: Payer: Self-pay | Admitting: Hematology

## 2019-05-13 ENCOUNTER — Inpatient Hospital Stay: Payer: Medicare Other | Attending: Nurse Practitioner

## 2019-05-13 VITALS — BP 129/60 | HR 45 | Temp 98.5°F | Resp 17 | Ht 72.0 in | Wt 289.9 lb

## 2019-05-13 DIAGNOSIS — E210 Primary hyperparathyroidism: Secondary | ICD-10-CM | POA: Diagnosis not present

## 2019-05-13 DIAGNOSIS — Z9221 Personal history of antineoplastic chemotherapy: Secondary | ICD-10-CM | POA: Insufficient documentation

## 2019-05-13 DIAGNOSIS — C160 Malignant neoplasm of cardia: Secondary | ICD-10-CM | POA: Diagnosis present

## 2019-05-13 DIAGNOSIS — Z79899 Other long term (current) drug therapy: Secondary | ICD-10-CM | POA: Insufficient documentation

## 2019-05-13 DIAGNOSIS — G8929 Other chronic pain: Secondary | ICD-10-CM | POA: Diagnosis not present

## 2019-05-13 DIAGNOSIS — Z923 Personal history of irradiation: Secondary | ICD-10-CM | POA: Insufficient documentation

## 2019-05-13 DIAGNOSIS — M549 Dorsalgia, unspecified: Secondary | ICD-10-CM | POA: Insufficient documentation

## 2019-05-13 DIAGNOSIS — G7000 Myasthenia gravis without (acute) exacerbation: Secondary | ICD-10-CM | POA: Insufficient documentation

## 2019-05-13 DIAGNOSIS — I10 Essential (primary) hypertension: Secondary | ICD-10-CM | POA: Insufficient documentation

## 2019-05-13 LAB — CMP (CANCER CENTER ONLY)
ALT: 16 U/L (ref 0–44)
AST: 14 U/L — ABNORMAL LOW (ref 15–41)
Albumin: 3.9 g/dL (ref 3.5–5.0)
Alkaline Phosphatase: 83 U/L (ref 38–126)
Anion gap: 7 (ref 5–15)
BUN: 21 mg/dL (ref 8–23)
CO2: 22 mmol/L (ref 22–32)
Calcium: 10.5 mg/dL — ABNORMAL HIGH (ref 8.9–10.3)
Chloride: 108 mmol/L (ref 98–111)
Creatinine: 0.9 mg/dL (ref 0.61–1.24)
GFR, Est AFR Am: >60 mL/min (ref >60)
GFR, Estimated: >60 mL/min (ref >60)
Glucose, Bld: 116 mg/dL — ABNORMAL HIGH (ref 70–99)
Potassium: 4.3 mmol/L (ref 3.5–5.1)
Sodium: 137 mmol/L (ref 135–145)
Total Bilirubin: 0.8 mg/dL (ref 0.3–1.2)
Total Protein: 6.2 g/dL — ABNORMAL LOW (ref 6.5–8.1)

## 2019-05-13 LAB — CBC WITH DIFFERENTIAL (CANCER CENTER ONLY)
Abs Immature Granulocytes: 0.03 10*3/uL (ref 0.00–0.07)
Basophils Absolute: 0.1 10*3/uL (ref 0.0–0.1)
Basophils Relative: 1 %
Eosinophils Absolute: 0.2 10*3/uL (ref 0.0–0.5)
Eosinophils Relative: 3 %
HCT: 40.1 % (ref 39.0–52.0)
Hemoglobin: 13.5 g/dL (ref 13.0–17.0)
Immature Granulocytes: 1 %
Lymphocytes Relative: 15 %
Lymphs Abs: 1 10*3/uL (ref 0.7–4.0)
MCH: 31.6 pg (ref 26.0–34.0)
MCHC: 33.7 g/dL (ref 30.0–36.0)
MCV: 93.9 fL (ref 80.0–100.0)
Monocytes Absolute: 0.8 10*3/uL (ref 0.1–1.0)
Monocytes Relative: 12 %
Neutro Abs: 4.4 10*3/uL (ref 1.7–7.7)
Neutrophils Relative %: 68 %
Platelet Count: 193 10*3/uL (ref 150–400)
RBC: 4.27 MIL/uL (ref 4.22–5.81)
RDW: 13.6 % (ref 11.5–15.5)
WBC Count: 6.4 10*3/uL (ref 4.0–10.5)
nRBC: 0 % (ref 0.0–0.2)

## 2019-05-13 LAB — CEA (IN HOUSE-CHCC): CEA (CHCC-In House): 2.3 ng/mL (ref 0.00–5.00)

## 2019-05-13 NOTE — Telephone Encounter (Signed)
Scheduled appt per 1/11 los.  Sent a message to HIM pool to get a calendar mailed out. 

## 2019-05-14 ENCOUNTER — Encounter: Payer: Self-pay | Admitting: Hematology

## 2019-05-17 ENCOUNTER — Encounter: Payer: Self-pay | Admitting: Hematology

## 2019-05-22 ENCOUNTER — Other Ambulatory Visit: Payer: Self-pay | Admitting: Physician Assistant

## 2019-05-30 ENCOUNTER — Ambulatory Visit: Payer: Medicare Other

## 2019-06-08 ENCOUNTER — Ambulatory Visit: Payer: Medicare Other

## 2019-06-12 ENCOUNTER — Encounter: Payer: Self-pay | Admitting: Diagnostic Neuroimaging

## 2019-06-12 ENCOUNTER — Ambulatory Visit (INDEPENDENT_AMBULATORY_CARE_PROVIDER_SITE_OTHER): Payer: Medicare Other | Admitting: Diagnostic Neuroimaging

## 2019-06-12 ENCOUNTER — Other Ambulatory Visit: Payer: Self-pay

## 2019-06-12 VITALS — BP 131/74 | HR 82 | Temp 97.4°F | Ht 72.0 in | Wt 297.6 lb

## 2019-06-12 DIAGNOSIS — G7000 Myasthenia gravis without (acute) exacerbation: Secondary | ICD-10-CM | POA: Diagnosis not present

## 2019-06-12 MED ORDER — MYCOPHENOLATE MOFETIL 500 MG PO TABS: 1000.0000 mg | ORAL_TABLET | Freq: Two times a day (BID) | ORAL | 12 refills | Status: DC

## 2019-06-12 MED ORDER — PYRIDOSTIGMINE BROMIDE 60 MG PO TABS: 30.0000 mg | ORAL_TABLET | Freq: Three times a day (TID) | ORAL | 12 refills | Status: DC

## 2019-06-12 NOTE — Progress Notes (Signed)
[facility]  PATIENT: Philip [name] DOB: 21-Mar-1946  REFERRING CLINICIAN: Delman Cheadle, J HISTORY FROM: patient and chart review REASON FOR VISIT: follow up    HISTORICAL  CHIEF COMPLAINT:  Chief Complaint  Patient presents with  . Myasthenia gravis    rm 7, 6 month FU  "doing pretty good but tired alll the time"    HISTORY OF PRESENT ILLNESS:   UPDATE (06/12/19, VRP): Since last visit, doing about the same. Symptoms are stable; mainly generalized fatigue. No focal muscle weakness or double vision. No major shortness of breath. No alleviating or aggravating factors. Tolerating cellcept and mestinon.    UPDATE (12/10/18, VRP): Since last visit, doing well. Symptoms are resolved from MG. No alleviating or aggravating factors. Tolerating meds.    UPDATE (06/11/18, VRP): Since last visit, now dx'd with "1. 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 Burr Medico (oncology). He was not felt to be good candidate for surgery, so he has been treated with chemotherapy (x1) and radiation (x3) and so far doing well except for fatigue and some SOB (mainly on exertion).   Myasthenia gravis is stable. No double vision. Tolerating cellcept. Off mestinon since Aug 2019 (at patient's request to see if still needed).   UPDATE (12/04/17, VRP): Since last visit, doing well. Symptoms are resolved. Tolerating cellcept and mestinon. No more double vision. No generalized fatigue.   UPDATE (03/13/17, VRP): Since last visit, doing well. IVIG helped quite a bit. Tolerating pyridostigmine. No alleviating or aggravating factors. No generalized weakness but notes some non-specific "tired" feeling. No double vision or drooping eyelids.   UPDATE (01/17/17, VRP): Since last visit, doing well on pyridostigmine. It worked the next day. AchR ab are positive. Some generalized fatigue and weakness are noted and new since last visit. Went to ER for laryngospasm (?upper resp infx) and  tx'd with 4 days prednisone and oral lidocaine.    PRIOR HPI (12/21/16): 74 year old male here for evaluation of double vision. Her past to 3 weeks patient has had intermittent double vision.  Symptoms present with both eyes open but improve if he closes one eye or uses an eye patch. He describes objects as being up and to the side from each other.  They slightly overlap.  This fluctuates.  Symptoms worse later in the day.  He also had some swallowing difficulties for the past 2 months without a specific cause to be found. He has some generalized fatigue as well. Patient went to [facility] eye clinic, with no specific ocular pathology found.  Patient was referred here for consideration of possible neuromuscular, neuro inflammatory or neuro vascular cause of symptoms. No unilateral numbness or weakness.  No sudden strokelike symptoms.  No headaches.  No prodromal factors.   REVIEW OF SYSTEMS: Full 14 system review of systems performed and negative with exception of: as per HPI.  ALLERGIES: No Known Allergies  HOME MEDICATIONS: Outpatient Medications Prior to Visit  Medication Sig Dispense Refill  . ALBUTEROL IN Inhale 2 puffs into the lungs every 4 (four) hours as needed (shortness of breath or wheezing).     [name] amitriptyline (ELAVIL) 25 MG tablet Take 25 mg by mouth daily.     [name] aspirin EC 81 MG tablet Take 81 mg by mouth daily.    [name] atorvastatin (LIPITOR) 10 MG tablet Take 10 mg by mouth at bedtime.     . Esomeprazole Magnesium 20 MG TBEC Take 40 mg by mouth daily at 12 noon.     [name]  etodolac (LODINE) 200 MG capsule TAKE ONE CAPSULE BY MOUTH EVERY 8 HOURS 30 capsule 2  . furosemide (LASIX) 20 MG tablet Take 20 mg by mouth daily.    [name] lisinopril (ZESTRIL) 20 MG tablet Take 20 mg by mouth daily.    . Multiple Vitamin (MULTIVITAMIN) tablet Take 1 tablet by mouth daily.    . mycophenolate (CELLCEPT) 500 MG tablet Take 2 tablets (1,000 mg total) by mouth 2 (two) times daily. 120 tablet 12  . ondansetron  (ZOFRAN) 8 MG tablet Take 1 tablet (8 mg total) by mouth 2 (two) times daily as needed for refractory nausea / vomiting. Start on day 3 after chemo. 30 tablet 1  . prochlorperazine (COMPAZINE) 10 MG tablet Take 1 tablet (10 mg total) by mouth every 6 (six) hours as needed (Nausea or vomiting). 30 tablet 1  . pyridostigmine (MESTINON) 60 MG tablet Take 0.5-1 tablets (30-60 mg total) by mouth 3 (three) times daily. 90 tablet 12  . sucralfate (CARAFATE) 1 g tablet TAKE ONE TABLET BY MOUTH FOUR TIMES A DAY WITH MEALS AND AT BEDTIME 120 tablet 0  . tamsulosin (FLOMAX) 0.4 MG CAPS capsule Take 1 capsule (0.4 mg total) by mouth daily after supper. 30 capsule 0  . traMADol (ULTRAM) 50 MG tablet Take 50 mg by mouth every 6 (six) hours as needed for pain.     No facility-administered medications prior to visit.    PAST MEDICAL HISTORY: Past Medical History:  Diagnosis Date  . Abdominal hernia   . Anemia   . Asthma   . Barrett's esophagus   . Cancer (HCC)    Esophagel cancer  . Cataract    Bil surgery  . Cough    over 2 weeks  . Fluid retention in legs   . GERD (gastroesophageal reflux disease)   . H/O hiatal hernia   . Hypertension   . Myasthenia gravis ([place])   . Sleep apnea    don't use c-pap at home/ tried for several years  . SOB (shortness of breath)     PAST SURGICAL HISTORY: Past Surgical History:  Procedure Laterality Date  . BACK SURGERY     5th lumbar  . BALLOON DILATION N/A 10/19/2016   Procedure: BALLOON DILATION;  Surgeon: Ladene Artist, MD;  Location: [facility];  Service: Endoscopy;  Laterality: N/A;  . COLONOSCOPY W/ POLYPECTOMY    . ESOPHAGOGASTRODUODENOSCOPY N/A 10/19/2016   Procedure: ESOPHAGOGASTRODUODENOSCOPY (EGD);  Surgeon: Ladene Artist, MD;  Location: [facility];  Service: Endoscopy;  Laterality: N/A;  . ESOPHAGOGASTRODUODENOSCOPY (EGD) WITH PROPOFOL N/A 02/12/2018   Procedure: ESOPHAGOGASTRODUODENOSCOPY (EGD) WITH PROPOFOL;  Surgeon: Rush Landmark  [name], MD;  Location: [facility];  Service: Gastroenterology;  Laterality: N/A;  . EUS N/A 02/12/2018   Procedure: UPPER ENDOSCOPIC ULTRASOUND (EUS) RADIAL;  Surgeon: [name], MD;  Location: [facility];  Service: Gastroenterology;  Laterality: N/A;  . EYE SURGERY Bilateral    cataract  . LUMBAR LAMINECTOMY     1980's    FAMILY HISTORY: Family History  Problem Relation Age of Onset  . Heart disease Mother   . Lung cancer Father   . Colon cancer Neg Hx     SOCIAL HISTORY:  Social History   Socioeconomic History  . Marital status: Single    Spouse name: Not on file  . Number of children: 0  . Years of education: 69  . Highest education level: Not on file  Occupational History    Comment: retired  Tobacco Use  . Smoking status: Former Smoker    Packs/day: 1.00    Years: 7.00    Pack years: 7.00    Quit date: 12/21/1976    Years since quitting: 42.5  . Smokeless tobacco: Never Used  Substance and Sexual Activity  . Alcohol use: Yes    Comment: occ  . Drug use: No  . Sexual activity: Not on file  Other Topics Concern  . Not on file  Social History Narrative   Lives alone   Caffeine- coffee, tea, sodas 1/2 gallon total daily   Social Determinants of Health   Financial Resource Strain:   . Difficulty of Paying Living Expenses: Not on file  Food Insecurity:   . Worried About Charity fundraiser in the Last Year: Not on file  . Ran Out of Food in the Last Year: Not on file  Transportation Needs:   . Lack of Transportation (Medical): Not on file  . Lack of Transportation (Non-Medical): Not on file  Physical Activity:   . Days of Exercise per Week: Not on file  . Minutes of Exercise per Session: Not on file  Stress:   . Feeling of Stress : Not on file  Social Connections:   . Frequency of Communication with Friends and Family: Not on file  . Frequency of Social Gatherings with Friends and Family: Not on file  . Attends Religious Services:  Not on file  . Active Member of Clubs or Organizations: Not on file  . Attends Archivist Meetings: Not on file  . Marital Status: Not on file  Intimate Partner Violence:   . Fear of Current or Ex-Partner: Not on file  . Emotionally Abused: Not on file  . Physically Abused: Not on file  . Sexually Abused: Not on file     PHYSICAL EXAM  GENERAL EXAM/CONSTITUTIONAL: Vitals:  Vitals:   06/12/19 1404  BP: 131/74  Pulse: 82  Temp: (!) 97.4 F (36.3 C)  Weight: 297 lb 9.6 oz (135 kg)  Height: 6' (1.829 m)   Wt Readings from Last 10 Encounters:  06/12/19 297 lb 9.6 oz (135 kg)  05/13/19 289 lb 14.4 oz (131.5 kg)  02/11/19 285 lb (129.3 kg)  02/05/19 271 lb (122.9 kg)  01/30/19 277 lb 9 oz (125.9 kg)  12/22/18 271 lb (122.9 kg)  12/10/18 271 lb (122.9 kg)  11/08/18 265 lb 6.4 oz (120.4 kg)  09/13/18 261 lb 14.4 oz (118.8 kg)  08/08/18 264 lb 8 oz (120 kg)    Body mass index is 40.36 kg/m. No exam data present  Patient is in no distress; well developed, nourished and groomed; neck is supple  CARDIOVASCULAR:  Examination of carotid arteries is normal; no carotid bruits  REGULAR rate and rhythm, no murmurs  Examination of peripheral vascular system by observation and palpation is normal  EYES:  Ophthalmoscopic exam of optic discs and posterior segments is normal; no papilledema or hemorrhages  MUSCULOSKELETAL:  Gait, strength, tone, movements noted in Neurologic exam below  NEUROLOGIC: MENTAL STATUS:  No flowsheet data found.  awake, alert, oriented to person, place and time  recent and remote memory intact  normal attention and concentration  language fluent, comprehension intact, naming intact,   fund of knowledge appropriate  CRANIAL NERVE:   2nd - no papilledema on fundoscopic exam  2nd, 3rd, 4th, 6th - pupils equal and reactive to light, visual fields full to confrontation, extraocular muscles intact, no nystagmus; NO PTOSIS; NO DOUBLE  VISION  5th - facial sensation symmetric  7th - facial strength symmetric  8th - hearing intact  9th - palate elevates symmetrically, uvula midline  11th - shoulder shrug symmetric  12th - tongue protrusion midline  HOARSE VOICE  MOTOR:   normal bulk and tone, full strength in the BUE, BLE  SENSORY:   normal and symmetric to light touch, temperature  ABSENT VIBRATION IN TOES  COORDINATION:   finger-nose-finger, fine finger movements SLOW  REFLEXES:   deep tendon reflexes TRACE and symmetric; ABSENT AT ANKLES  GAIT/STATION:   SMOOTH GAIT    DIAGNOSTIC DATA (LABS, IMAGING, TESTING) - I reviewed patient records, labs, notes, testing and imaging myself where available.  Lab Results  Component Value Date   WBC 6.4 05/13/2019   HGB 13.5 05/13/2019   HCT 40.1 05/13/2019   MCV 93.9 05/13/2019   PLT 193 05/13/2019      Component Value Date/Time   NA 137 05/13/2019 0920   NA 138 12/04/2017 1715   K 4.3 05/13/2019 0920   CL 108 05/13/2019 0920   CO2 22 05/13/2019 0920   GLUCOSE 116 (H) 05/13/2019 0920   BUN 21 05/13/2019 0920   BUN 19 12/04/2017 1715   CREATININE 0.90 05/13/2019 0920   CALCIUM 10.5 (H) 05/13/2019 0920   CALCIUM 10.8 (H) 06/28/2018 1347   PROT 6.2 (L) 05/13/2019 0920   PROT 6.0 12/04/2017 1715   ALBUMIN 3.9 05/13/2019 0920   ALBUMIN 4.0 12/04/2017 1715   AST 14 (L) 05/13/2019 0920   ALT 16 05/13/2019 0920   ALKPHOS 83 05/13/2019 0920   BILITOT 0.8 05/13/2019 0920   GFRNONAA >60 05/13/2019 0920   GFRAA >60 05/13/2019 0920   No results found for: CHOL, HDL, LDLCALC, LDLDIRECT, TRIG, CHOLHDL Lab Results  Component Value Date   HGBA1C 5.4 12/21/2016   Lab Results  Component Value Date   [id number] 07/24/2018   Lab Results  Component Value Date   TSH 2.040 12/21/2016    AChR Binding Ab, Serum  Date Value Ref Range Status  12/21/2016 65.60 (H) 0.00 - 0.24 nmol/L Final    Comment:    **Results verified by repeat  testing**                                Negative:   0.00 - 0.24                                Borderline: 0.25 - 0.40                                Positive:        > 0.40     01/10/17 MRI brain  - Abnormal MRI scan of the brain showing mild changes of chronic microvascular ischemia and generalized cerebral atrophy. Incidental changes of chronic paranasal sinusitis are noted. 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     ASSESSMENT AND PLAN  74 y.o. year old male here with fatigable, fluctuating, intermittent double vision for past 2-3 weeks, with approximately 2 months of swallowing difficulty.  Also with generalized weakness intermittently.  Has positive AchR antibodies and good response to pyridostigmine and IVIG.  Cellcept --> started 03/13/17.   Dx: myasthenia gravis  1. Myasthenia gravis ([place])  PLAN:  MYASTHENIA GRAVIS (stable) - continue cellcept 1000mg  twice a day for longer term immunosuppression (will avoid prednisone due to potential side effects of weight gait, HTN, hyperglycemia); caution with chemotherapy and cellcept together (risk of infection) - repeat CBC, CMP every 3-6 months (for cellcept monitoring)  SHORTNESS OF BREATH (improved) - follow up with PCP, oncology, pulmonology for further evaluation  Meds ordered this encounter  Medications  . mycophenolate (CELLCEPT) 500 MG tablet    Sig: Take 2 tablets (1,000 mg total) by mouth 2 (two) times daily.    Dispense:  120 tablet    Refill:  12  . pyridostigmine (MESTINON) 60 MG tablet    Sig: Take 0.5-1 tablets (30-60 mg total) by mouth 3 (three) times daily.    Dispense:  90 tablet    Refill:  12   Return in about 8 months (around 02/09/2020).    Penni Bombard, MD [id number], 0000000 PM Certified in Neurology, Neurophysiology and Neuroimaging  [facility] [address], [place], [place] [address] [phone]

## 2019-06-18 ENCOUNTER — Other Ambulatory Visit: Payer: Self-pay | Admitting: Hematology

## 2019-06-20 ENCOUNTER — Ambulatory Visit: Payer: Medicare Other

## 2019-06-27 ENCOUNTER — Other Ambulatory Visit: Payer: Self-pay

## 2019-06-27 ENCOUNTER — Ambulatory Visit: Payer: Medicare Other | Admitting: *Deleted

## 2019-06-27 VITALS — Temp 97.7°F | Ht 72.0 in | Wt 292.4 lb

## 2019-06-27 DIAGNOSIS — C159 Malignant neoplasm of esophagus, unspecified: Secondary | ICD-10-CM

## 2019-06-27 DIAGNOSIS — Z01818 Encounter for other preprocedural examination: Secondary | ICD-10-CM

## 2019-06-27 NOTE — Progress Notes (Signed)

## 2019-07-02 ENCOUNTER — Ambulatory Visit (INDEPENDENT_AMBULATORY_CARE_PROVIDER_SITE_OTHER): Payer: Medicare Other

## 2019-07-02 ENCOUNTER — Other Ambulatory Visit: Payer: Self-pay

## 2019-07-02 DIAGNOSIS — Z1159 Encounter for screening for other viral diseases: Secondary | ICD-10-CM

## 2019-07-03 LAB — SARS CORONAVIRUS 2 (TAT 6-24 HRS): SARS Coronavirus 2: NEGATIVE

## 2019-07-08 ENCOUNTER — Encounter: Payer: Self-pay | Admitting: Hematology

## 2019-07-08 ENCOUNTER — Encounter: Payer: Self-pay | Admitting: Gastroenterology

## 2019-07-08 ENCOUNTER — Ambulatory Visit (AMBULATORY_SURGERY_CENTER): Payer: Medicare Other | Admitting: Gastroenterology

## 2019-07-08 ENCOUNTER — Other Ambulatory Visit: Payer: Self-pay

## 2019-07-08 VITALS — BP 120/68 | HR 81 | Temp 97.7°F | Resp 14 | Ht 72.0 in | Wt 292.4 lb

## 2019-07-08 DIAGNOSIS — C159 Malignant neoplasm of esophagus, unspecified: Secondary | ICD-10-CM

## 2019-07-08 DIAGNOSIS — C155 Malignant neoplasm of lower third of esophagus: Secondary | ICD-10-CM | POA: Diagnosis not present

## 2019-07-08 MED ORDER — SODIUM CHLORIDE 0.9 % IV SOLN: 500.0000 mL | Freq: Once | INTRAVENOUS | Status: DC

## 2019-07-08 NOTE — Progress Notes (Signed)
Called to room to assist during endoscopic procedure.  Patient ID and intended procedure confirmed with present staff. Received instructions for my participation in the procedure from the performing physician.  

## 2019-07-08 NOTE — Patient Instructions (Signed)
Impression/Recommendations: ? ?Resume previous diet. ?Continue present medications. ?Await pathology results. ? ?YOU HAD AN ENDOSCOPIC PROCEDURE TODAY AT THE [facility]:   Refer to the procedure report that was given to you for any specific questions about what was found during the examination.  If the procedure report does not answer your questions, please call your gastroenterologist to clarify.  If you requested that your care partner not be given the details of your procedure findings, then the procedure report has been included in a sealed envelope for you to review at your convenience later. ? ?YOU SHOULD EXPECT: Some feelings of bloating in the abdomen. Passage of more gas than usual.  Walking can help get rid of the air that was put into your GI tract during the procedure and reduce the bloating. If you had a lower endoscopy (such as a colonoscopy or flexible sigmoidoscopy) you may notice spotting of blood in your stool or on the toilet paper. If you underwent a bowel prep for your procedure, you may not have a normal bowel movement for a few days. ? ?Please Note:  You might notice some irritation and congestion in your nose or some drainage.  This is from the oxygen used during your procedure.  There is no need for concern and it should clear up in a day or so. ? ?SYMPTOMS TO REPORT IMMEDIATELY: ? ?Following upper endoscopy (EGD) ? Vomiting of blood or coffee ground material ? New chest pain or pain under the shoulder blades ? Painful or persistently difficult swallowing ? New shortness of breath ? Fever of 100?F or higher ? Black, tarry-looking stools ? ?For urgent or emergent issues, a gastroenterologist can be reached at any hour by calling [phone]. ?Do not use MyChart messaging for urgent concerns.  ? ? ?DIET:  We do recommend a small meal at first, but then you may proceed to your regular diet.  Drink plenty of fluids but you should avoid alcoholic beverages for 24  hours. ? ?ACTIVITY:  You should plan to take it easy for the rest of today and you should NOT DRIVE or use heavy machinery until tomorrow (because of the sedation medicines used during the test).   ? ?FOLLOW UP: ?Our staff will call the number listed on your records 48-72 hours following your procedure to check on you and address any questions or concerns that you may have regarding the information given to you following your procedure. If we do not reach you, we will leave a message.  We will attempt to reach you two times.  During this call, we will ask if you have developed any symptoms of COVID 19. If you develop any symptoms (ie: fever, flu-like symptoms, shortness of breath, cough etc.) before then, please call [phone].  If you test positive for Covid 19 in the 2 weeks post procedure, please call and report this information to us.   ? ?If any biopsies were taken you will be contacted by phone or by letter within the next 1-3 weeks.  Please call us at [phone] if you have not heard about the biopsies in 3 weeks.  ? ? ?SIGNATURES/CONFIDENTIALITY: ?You and/or your care partner have signed paperwork which will be entered into your electronic medical record.  These signatures attest to the fact that that the information above on your After Visit Summary has been reviewed and is understood.  Full responsibility of the confidentiality of this discharge information lies with you and/or your care-partner.  ?

## 2019-07-08 NOTE — Progress Notes (Signed)
Pt's states no medical or surgical changes since previsit or office visit.  DT- vs in adm  JB- temp front desk

## 2019-07-08 NOTE — Progress Notes (Signed)
A and O x3. Report to RN. Tolerated MAC anesthesia well.Teeth unchanged after procedure.

## 2019-07-08 NOTE — Op Note (Signed)
[facility] Patient Name: Philip [name] Procedure Date: 07/08/2019 1:52 PM MRN: [id number] Endoscopist: Mallie Mussel L. Loletha Carrow , MD Age: 74 Referring MD:  Date of Birth: [birth year] Gender: Male Account #: [id number] Procedure:                Upper GI endoscopy Indications:              Personal history of malignant esophageal neoplasm                            (adenoCA Dx 12/2017, arising from Barrett's,                            Excellent response to CTX/XRT - no surgery. No                            recurrence last EGD 12/2018) Medicines:                Monitored Anesthesia Care Procedure:                Pre-Anesthesia Assessment:                           - Prior to the procedure, a History and Physical                            was performed, and patient medications and                            allergies were reviewed. The patient's tolerance of                            previous anesthesia was also reviewed. The risks                            and benefits of the procedure and the sedation                            options and risks were discussed with the patient.                            All questions were answered, and informed consent                            was obtained. Prior Anticoagulants: The patient has                            taken no previous anticoagulant or antiplatelet                            agents. ASA Grade Assessment: III - A patient with                            severe systemic disease. After reviewing the risks  and benefits, the patient was deemed in                            satisfactory condition to undergo the procedure.                           After obtaining informed consent, the endoscope was                            passed under direct vision. Throughout the                            procedure, the patient's blood pressure, pulse, and                            oxygen saturations were monitored  continuously. The                            Endoscope was introduced through the mouth, and                            advanced to the duodenal bulb. The upper GI                            endoscopy was accomplished without difficulty. The                            patient tolerated the procedure fairly well. Scope In: Scope Out: Findings:                 A single mucosal nodule with friable mucosa was                            found at the gastroesophageal junction. Biopsies                            were taken with a cold forceps for histology (firm                            on Bx). This tissue was also seen on gastric                            retroflexion (see photo)                           The exam of the esophagus was otherwise normal.                           Food (residue) was found in the gastric antrum and                            at the pylorus, limiting visualization.  The exam of the stomach was otherwise normal.                           Food (residue) was found in the duodenal bulb,                            limiting visualization and scope advancement to the                            second portion of duodenum. Complications:            No immediate complications. Estimated Blood Loss:     Estimated blood loss was minimal. Impression:               - Nodule found in the esophagus. Biopsied to rule                            out recurrent malignancy vs radiation effect.                           - Food (residue) in the stomach.                           - Retained food in the duodenum. Recommendation:           - Patient has a contact number available for                            emergencies. The signs and symptoms of potential                            delayed complications were discussed with the                            patient. Return to normal activities tomorrow.                            Written discharge instructions  were provided to the                            patient.                           - Resume previous diet.                           - Continue present medications.                           - Await pathology results. [name] L. Loletha Carrow, MD 07/08/2019 2:20:34 PM This report has been signed electronically.

## 2019-07-10 ENCOUNTER — Telehealth: Payer: Self-pay

## 2019-07-10 NOTE — Telephone Encounter (Signed)
  Follow up Call-  Call back number 07/08/2019 12/22/2018 01/22/2018  Post procedure Call Back phone  # [phone]  Permission to leave phone message Yes Yes Yes  Some recent data might be hidden     Patient questions:  Do you have a fever, pain , or abdominal swelling? No. Pain Score  0 *  Have you tolerated food without any problems? Yes.    Have you been able to return to your normal activities? Yes.    Do you have any questions about your discharge instructions: Diet   No. Medications  No. Follow up visit  No.  Do you have questions or concerns about your Care? No.  Actions: * If pain score is 4 or above: 1. No action needed, pain <4.Have you developed a fever since your procedure? no  2.   Have you had an respiratory symptoms (SOB or cough) since your procedure? no  3.   Have you tested positive for COVID 19 since your procedure no  4.   Have you had any family members/close contacts diagnosed with the COVID 19 since your procedure?  no   If yes to any of these questions please route to Joylene John, RN and Alphonsa Gin, Therapist, sports.

## 2019-07-11 ENCOUNTER — Telehealth: Payer: Self-pay | Admitting: Gastroenterology

## 2019-07-11 NOTE — Telephone Encounter (Signed)
Dr. Loletha Carrow,  Thanks much for letting me know, I appreciate it!  Santiago Glad, please set up a phone visit or office visit with me next week, no lab, thanks   Truitt Merle MD

## 2019-07-11 NOTE — Telephone Encounter (Signed)
Dr. Burr Medico,  I received a call from Dr. Saralyn Pilar and pathology today with results of EGD biopsy from earlier this week.  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  Final pathology report is not yet in Epic, but I wanted you to know since I will be out of the office for a few days.  I spoke with Mr. Crounse about this result, told him I would make you aware, and that he could expect to hear from your office soon with plans for follow-up and discussion of further testing and treatment.  Herma Ard [facility]

## 2019-07-12 ENCOUNTER — Telehealth: Payer: Self-pay | Admitting: Hematology

## 2019-07-12 NOTE — Telephone Encounter (Signed)
Scheduled per 3/12 sch msg. Called and spoke with patient. Confirmed appt

## 2019-07-12 NOTE — Telephone Encounter (Signed)
Message sent to schedulers 

## 2019-07-15 ENCOUNTER — Encounter: Payer: Self-pay | Admitting: Hematology

## 2019-07-15 ENCOUNTER — Inpatient Hospital Stay: Payer: Medicare Other | Attending: Nurse Practitioner | Admitting: Hematology

## 2019-07-15 DIAGNOSIS — K2270 Barrett's esophagus without dysplasia: Secondary | ICD-10-CM | POA: Insufficient documentation

## 2019-07-15 DIAGNOSIS — K219 Gastro-esophageal reflux disease without esophagitis: Secondary | ICD-10-CM | POA: Insufficient documentation

## 2019-07-15 DIAGNOSIS — Z79899 Other long term (current) drug therapy: Secondary | ICD-10-CM | POA: Insufficient documentation

## 2019-07-15 DIAGNOSIS — I10 Essential (primary) hypertension: Secondary | ICD-10-CM | POA: Insufficient documentation

## 2019-07-15 DIAGNOSIS — C160 Malignant neoplasm of cardia: Secondary | ICD-10-CM | POA: Diagnosis not present

## 2019-07-15 DIAGNOSIS — M549 Dorsalgia, unspecified: Secondary | ICD-10-CM | POA: Insufficient documentation

## 2019-07-15 DIAGNOSIS — G4730 Sleep apnea, unspecified: Secondary | ICD-10-CM | POA: Insufficient documentation

## 2019-07-15 DIAGNOSIS — Z923 Personal history of irradiation: Secondary | ICD-10-CM | POA: Insufficient documentation

## 2019-07-15 DIAGNOSIS — Z9221 Personal history of antineoplastic chemotherapy: Secondary | ICD-10-CM | POA: Insufficient documentation

## 2019-07-15 DIAGNOSIS — E210 Primary hyperparathyroidism: Secondary | ICD-10-CM | POA: Insufficient documentation

## 2019-07-15 DIAGNOSIS — J45909 Unspecified asthma, uncomplicated: Secondary | ICD-10-CM | POA: Insufficient documentation

## 2019-07-15 DIAGNOSIS — D84821 Immunodeficiency due to drugs: Secondary | ICD-10-CM | POA: Insufficient documentation

## 2019-07-15 DIAGNOSIS — G8929 Other chronic pain: Secondary | ICD-10-CM | POA: Insufficient documentation

## 2019-07-15 DIAGNOSIS — G7000 Myasthenia gravis without (acute) exacerbation: Secondary | ICD-10-CM | POA: Insufficient documentation

## 2019-07-15 NOTE — Progress Notes (Signed)
[place]   [phone] [phone]   Clinic Follow up Note   Patient Care Team: [name], MD as PCP - General (Family Medicine) Mansouraty, [name], MD as Consulting Physician (Gastroenterology) Zonia Kief, MD (Rehabilitation)   I connected with Philip [name] on 07/15/2019 at  8:40 AM EDT by telephone visit and verified that I am speaking with the correct person using two identifiers.  I discussed the limitations, risks, security and privacy concerns of performing an evaluation and management service by telephone and the availability of in person appointments. I also discussed with the patient that there may be a patient responsible charge related to this service. The patient expressed understanding and agreed to proceed.   Patient's location:  His home  Provider's location:  My Office   CHIEF COMPLAINT: F/u GEJ cancer  SUMMARY OF ONCOLOGIC HISTORY: Oncology History Overview Note  Cancer Staging Malignant neoplasm of gastroesophageal junction ([place]) Staging form: Esophagus - Adenocarcinoma, AJCC 8th Edition - Clinical stage from 02/26/2018: Stage IIB (cT2, cN0, cM0, G3) - Signed by [name], NP on 02/26/2018    Malignant neoplasm of gastroesophageal junction ([place])  01/22/2018 Initial Biopsy   Diagnosis Surgical [P], GE junction nodule BX - POORLY 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   01/22/2018 Procedure   COLONOSCOPY IMPRESSION - Redundant colon. - The examination was otherwise normal on direct and retroflexion views. - No specimens collected.  UPPER ENDOSCOPY IMPRESSION: - Esophageal mucosal changes secondary to established short-segment Barrett's disease. - Mucosal nodule found in the esophagus. Biopsied. - Erythematous mucosa in the prepyloric region of the stomach. - Submucosal nodule found in the duodenum.    02/01/2018 Initial Diagnosis   Malignant  neoplasm of gastroesophageal junction ([place])   02/05/2018 Imaging   CT CAP IMPRESSION: 1. No esophageal primary identified. No findings of metastatic disease in the chest, abdomen, or pelvis. 2.  Aortic Atherosclerosis (ICD10-I70.0).   02/12/2018 Procedure   EUS Impression: - A mass was found in the distal esophagus into the gastroesophageal junction and extending to proximal cardia. A tissue diagnosis was obtained prior to this exam consistent with adenocarcinoma. This was staged at T2N0Mx, because of a loss of interface of the muscularis propria as the distal GE Junction becomes the cardia and the mass moves into this region, but there are many more regions and images that suggest a T1 lesion into the submucosa.   05/29/2018 Imaging   NM PET Image Initial (PI) Skull Base To Thigh  IMPRESSION: 1. Hypermetabolic focus at the GE junction consistent with know adenocarcinoma. 2. No evidence of metastatic adenopathy in the mediastinum or upper abdomen. 3. No evidence of liver metastasis.  No pulmonary metastasis.   06/06/2018 -  Chemotherapy   Weeklycarboplatin and Taxol, with concurrent radiationstarting on 06/06/2018-07/10/18   06/06/2018 - 07/16/2018 Radiation Therapy   Concurrent chemoRT with Dr. Harvel Ricks on 06/06/2018-07/16/18   11/06/2018 PET scan   PET 11/06/18  IMPRESSION: 1. No residual hypermetabolic activity at the gastroesophageal junction. 2. No evidence of metastatic disease.   12/22/2018 Procedure   Upper Endoscopy by Dr. Loletha Carrow 12/22/18  IMPRESSION - Esophageal mucosal changes secondary to established short-segment Barrett's disease. - Erythematous, friable (with contact bleeding), nodular mucosa in the esophagus. Biopsied. May be inflammation from radiation. - Erythematous and petechial mucosa in the gastric fundus and gastric body. Appears likely related to radiation. - Multiple fundic gland polyps. - Eroded and nodular mucosa in the  antrum. Biopsied. - Submucosal nodule  found in the duodenum. Stable in size from prior exams.    12/22/2018 Pathology Results   Diagnosis 12/22/18 1. Surgical [P], random gastric BX - CHRONIC INACTIVE GASTRITIS. - THERE IS NO EVIDENCE OF HELICOBACTER PYLORI, DYSPLASIA, OR MALIGNANCY. - SEE COMMENT. 2. Surgical [P], esophagus, GE junction - GASTROESOPHAGEAL JUNCTION MUCOSA WITH MILD INFLAMMATION CONSISTENT WITH GASTROESOPHAGEAL REFLUX. - THERE IS NO EVIDENCE OF GOBLET CELL METAPLASIA, DYSPLASIA OR MALIGNANCY. - SEE COMMENT.   07/08/2019 Procedure   Upper Endoscopy by Dr. Loletha Carrow 07/08/19  IMPRESSION - Nodule found in the esophagus. Biopsied to rule out recurrent malignancy vs radiation effect. - Food (residue) in the stomach. - Retained food in the duodenum.   07/08/2019 Pathology Results   Diagnosis Surgical [P], distal esophagus - POORLY DIFFERENTIATED ADENOCARCINOMA WITH SIGNET RING FEATURES      CURRENT THERAPY:  Surveillance  INTERVAL HISTORY:  Philip [name] is here for a follow up of GEJ cancer. They identified themselves by birth date. He notes he is doing well. He denies recent pain, dysphagia or weight loss.     REVIEW OF SYSTEMS:   Constitutional: Denies fevers, chills or abnormal weight loss Eyes: Denies blurriness of vision Ears, nose, mouth, throat, and face: Denies mucositis or sore throat Respiratory: Denies cough, dyspnea or wheezes Cardiovascular: Denies palpitation, chest discomfort or lower extremity swelling Gastrointestinal:  Denies nausea, heartburn or change in bowel habits Skin: Denies abnormal skin rashes Lymphatics: Denies new lymphadenopathy or easy bruising Neurological:Denies numbness, tingling or new weaknesses Behavioral/Psych: Mood is stable, no new changes  All other systems were reviewed with the patient and are negative.  MEDICAL HISTORY:  Past Medical History:  Diagnosis Date  . Abdominal hernia   . Anemia   . Asthma   . Barrett's esophagus   . Cancer (HCC)     Esophagel cancer  . Cataract    Bil surgery  . Cough    over 2 weeks  . Fluid retention in legs   . GERD (gastroesophageal reflux disease)   . H/O hiatal hernia   . Hypertension   . Myasthenia gravis ([place])   . Sleep apnea    don't use c-pap at home/ tried for several years  . SOB (shortness of breath)     SURGICAL HISTORY: Past Surgical History:  Procedure Laterality Date  . BACK SURGERY     5th lumbar  . BALLOON DILATION N/A 10/19/2016   Procedure: BALLOON DILATION;  Surgeon: Ladene Artist, MD;  Location: [facility];  Service: Endoscopy;  Laterality: N/A;  . COLONOSCOPY W/ POLYPECTOMY    . ESOPHAGOGASTRODUODENOSCOPY N/A 10/19/2016   Procedure: ESOPHAGOGASTRODUODENOSCOPY (EGD);  Surgeon: Ladene Artist, MD;  Location: [facility];  Service: Endoscopy;  Laterality: N/A;  . ESOPHAGOGASTRODUODENOSCOPY (EGD) WITH PROPOFOL N/A 02/12/2018   Procedure: ESOPHAGOGASTRODUODENOSCOPY (EGD) WITH PROPOFOL;  Surgeon: [name], MD;  Location: [facility];  Service: Gastroenterology;  Laterality: N/A;  . EUS N/A 02/12/2018   Procedure: UPPER ENDOSCOPIC ULTRASOUND (EUS) RADIAL;  Surgeon: [name], MD;  Location: [facility];  Service: Gastroenterology;  Laterality: N/A;  . EYE SURGERY Bilateral    cataract  . LUMBAR LAMINECTOMY     1980's    I have reviewed the social history and family history with the patient and they are unchanged from previous note.  ALLERGIES:  has No Known Allergies.  MEDICATIONS:  Current Outpatient Medications  Medication Sig Dispense Refill  . ALBUTEROL IN Inhale 2 puffs into the  lungs every 4 (four) hours as needed (shortness of breath or wheezing).     [name] amitriptyline (ELAVIL) 25 MG tablet Take 25 mg by mouth daily.     [name] aspirin EC 81 MG tablet Take 81 mg by mouth daily.    [name] atorvastatin (LIPITOR) 10 MG tablet Take 10 mg by mouth at bedtime.     . Esomeprazole Magnesium 20 MG TBEC Take 40 mg by mouth daily at 12 noon.       . etodolac (LODINE) 200 MG capsule TAKE ONE CAPSULE BY MOUTH EVERY 8 HOURS 30 capsule 2  . furosemide (LASIX) 20 MG tablet Take 20 mg by mouth daily.    [name] lisinopril (ZESTRIL) 20 MG tablet Take 20 mg by mouth daily.    . Multiple Vitamin (MULTIVITAMIN) tablet Take 1 tablet by mouth daily.    . mycophenolate (CELLCEPT) 500 MG tablet Take 2 tablets (1,000 mg total) by mouth 2 (two) times daily. 120 tablet 12  . ondansetron (ZOFRAN) 8 MG tablet Take 1 tablet (8 mg total) by mouth 2 (two) times daily as needed for refractory nausea / vomiting. Start on day 3 after chemo. (Patient not taking: Reported on 06/27/2019) 30 tablet 1  . prochlorperazine (COMPAZINE) 10 MG tablet Take 1 tablet (10 mg total) by mouth every 6 (six) hours as needed (Nausea or vomiting). (Patient not taking: Reported on 06/27/2019) 30 tablet 1  . pyridostigmine (MESTINON) 60 MG tablet Take 0.5-1 tablets (30-60 mg total) by mouth 3 (three) times daily. 90 tablet 12  . sucralfate (CARAFATE) 1 g tablet TAKE ONE TABLET BY MOUTH FOUR TIMES A DAY WITH MEALS AND AT BEDTIME (Patient not taking: Reported on 06/27/2019) 120 tablet 0  . tamsulosin (FLOMAX) 0.4 MG CAPS capsule Take 1 capsule (0.4 mg total) by mouth daily after supper. 30 capsule 0  . traMADol (ULTRAM) 50 MG tablet Take 50 mg by mouth every 6 (six) hours as needed for pain.     Current Facility-Administered Medications  Medication Dose Route Frequency Provider Last Rate Last Admin  . 0.9 %  sodium chloride infusion  500 mL Intravenous Once Nelida Meuse III, MD        PHYSICAL EXAMINATION: ECOG PERFORMANCE STATUS: 1 - Symptomatic but completely ambulatory  No vitals taken today, Exam not performed today   LABORATORY DATA:  I have reviewed the data as listed CBC Latest Ref Rng & Units 05/13/2019 02/11/2019 11/06/2018  WBC 4.0 - 10.5 K/uL 6.4 6.3 5.8  Hemoglobin 13.0 - 17.0 g/dL 13.5 12.3(L) 13.1  Hematocrit 39.0 - 52.0 % 40.1 37.4(L) 39.2  Platelets 150 - 400 K/uL 193  184 182     CMP Latest Ref Rng & Units 05/13/2019 02/11/2019 11/06/2018  Glucose 70 - 99 mg/dL 116(H) 100(H) 102(H)  BUN 8 - 23 mg/dL 21 19 12   Creatinine 0.61 - 1.24 mg/dL 0.90 1.02 0.86  Sodium 135 - 145 mmol/L 137 138 136  Potassium 3.5 - 5.1 mmol/L 4.3 4.7 4.1  Chloride 98 - 111 mmol/L 108 107 107  CO2 22 - 32 mmol/L 22 23 23   Calcium 8.9 - 10.3 mg/dL 10.5(H) 10.7(H) 10.8(H)  Total Protein 6.5 - 8.1 g/dL 6.2(L) 6.1(L) 6.3(L)  Total Bilirubin 0.3 - 1.2 mg/dL 0.8 0.5 0.6  Alkaline Phos 38 - 126 U/L 83 80 78  AST 15 - 41 U/L 14(L) 15 19  ALT 0 - 44 U/L 16 19 17     RADIOGRAPHIC STUDIES: I have personally reviewed the radiological  images as listed and agreed with the findings in the report. No results found.   ASSESSMENT & PLAN:  Philip [name] is a 74 y.o. male with    1. 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 -Due to his age and comorbidities, he was felt to be a poor candidate for surgery. He was previously seen by thoracic surgery Dr. Pia Mau. -He completed concurrent chemoRT with CT.Given he did not have surgery his risk of recurrence is more than 50%. Will continue with surveillance. His post-treatment PET and endoscopy in 12/2018 showed no residual disease  -We discussed his Upper endoscopy by Dr. Loletha Carrow from 07/08/19 which showed nodule at distal esophagus/GEJ. His biopsy shows poorly differentiated adenocarcinoma, appears early stage cancer recurrence.  This could be residual disease or local recurrence, he is currently asymptomatic.  -I recommend PET scan for restaging and rule out metastasis, he is agreeable.  -I also discussed treatment options. He has had RT in the past 2 years and he is not eligible for further radiation at this time.  Patient is not interested in esophagectomy, I will reach  out to his GI at [facility] to see if they can evaluate him for endoscopy resection -If not he does not proceed with surgical resection chemotherapy is an option to control his disease. I will also send biopsy sample for molecular testing to see if he is eligible for target therapy or immunotherapy.  -F/u after PET scan.    3.Chronic back pain  -Takestramadol as needed -Continue toF/uwith pain specialist -I previously [facility]  4. HTN -Was onlisinopril. Due to hypotension Ipreviouslyinstructed him to hold lisinopriland add salt to diet -F/u with PCP. Has been normal lately.   5.Myasthenia gravis -Oncellcept 1000mg BIDfor longer term immunosuppression -He is at high risk for infections due topriorchemo -Stablewith fatigue  6. Hypercalcemia,primary hyperparathyroidism -He takes Vitamin D. He is fine to take multivitamin that contains low dose calcium. I advised him to avoid high dose calcium. -Labs havesupported primary hyperparathyroidism. F/u wit h PCP about this.    Plan -Upper Endoscopy and biopsy reviewed, shows cancer recurrence  -PET scan in 1-2 weeks  -will reach out to [facility] GI  -Lab and F/u in a few days after PET scan    No problem-specific Assessment & Plan notes found for this encounter.   Orders Placed This Encounter  Procedures  . NM PET Image Restag (PS) Skull Base To Thigh    Standing Status:   Future    Standing Expiration Date:   07/14/2020    Order Specific Question:   If indicated for the ordered procedure, I authorize the administration of a radiopharmaceutical per Radiology protocol    Answer:   Yes    Order Specific Question:   Radiology Contrast Protocol - do NOT remove file path    Answer:   [email or web address]   I discussed the assessment and treatment plan with the patient. The  patient was provided an opportunity to ask questions and all were answered. The patient agreed with the plan and demonstrated an understanding of the instructions.  The patient was advised to call back or seek an in-person evaluation if the symptoms worsen or if the condition fails to improve as anticipated.  The total time spent in the appointment  was 25 minutes.    Truitt Merle, MD 07/15/2019   I, Joslyn Devon, am acting as scribe for Truitt Merle, MD.   I have reviewed the above documentation for accuracy and completeness, and I agree with the above.

## 2019-07-16 ENCOUNTER — Telehealth: Payer: Self-pay | Admitting: Hematology

## 2019-07-16 ENCOUNTER — Telehealth: Payer: Self-pay

## 2019-07-16 NOTE — Telephone Encounter (Signed)
Scheduled appt per 3/15 los.  Spoke with pt and he is aware of the appt date and time.

## 2019-07-16 NOTE — Telephone Encounter (Signed)
Philip. Philip [name] left vm stating that while he was making his PET scan appt he was disconnected.  I called central scheduling and scheduled his PET scan for 3/25 at 1100.  I left a vm for Philip [name] to call me.

## 2019-07-17 NOTE — Telephone Encounter (Signed)
I spoke with Philip [name] and reviewed his upcoming appts including the PET scan I instructed him to be np after 5am the day of the PET scan.  He verbalized understanding.

## 2019-07-22 NOTE — Progress Notes (Addendum)
[place]   [phone] Fax:([phone]   Clinic Follow up Note   Patient Care Team: [name], MD as PCP - General (Family Medicine) Mansouraty, [name], MD as Consulting Physician (Gastroenterology) Zonia Kief, MD (Rehabilitation)  Date of Service:  08/02/2019  CHIEF COMPLAINT:  F/u GEJ cancer  SUMMARY OF ONCOLOGIC HISTORY: Oncology History Overview Note  Cancer Staging Malignant neoplasm of gastroesophageal junction [facility]) Staging form: Esophagus - Adenocarcinoma, AJCC 8th Edition - Clinical stage from 02/26/2018: Stage IIB (cT2, cN0, cM0, G3) - Signed by [name], NP on 02/26/2018    Malignant neoplasm of gastroesophageal junction ([place])  01/22/2018 Initial Biopsy   Diagnosis Surgical [P], GE junction nodule BX - POORLY 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   01/22/2018 Procedure   COLONOSCOPY IMPRESSION - Redundant colon. - The examination was otherwise normal on direct and retroflexion views. - No specimens collected.  UPPER ENDOSCOPY IMPRESSION: - Esophageal mucosal changes secondary to established short-segment Barrett's disease. - Mucosal nodule found in the esophagus. Biopsied. - Erythematous mucosa in the prepyloric region of the stomach. - Submucosal nodule found in the duodenum.    02/01/2018 Initial Diagnosis   Malignant neoplasm of gastroesophageal junction ([place])   02/05/2018 Imaging   CT CAP IMPRESSION: 1. No esophageal primary identified. No findings of metastatic disease in the chest, abdomen, or pelvis. 2.  Aortic Atherosclerosis (ICD10-I70.0).   02/12/2018 Procedure   EUS Impression: - A mass was found in the distal esophagus into the gastroesophageal junction and extending to proximal cardia. A tissue diagnosis was obtained prior to this exam consistent with adenocarcinoma. This was staged at T2N0Mx, because of a loss of interface of the  muscularis propria as the distal GE Junction becomes the cardia and the mass moves into this region, but there are many more regions and images that suggest a T1 lesion into the submucosa.   05/29/2018 Imaging   NM PET Image Initial (PI) Skull Base To Thigh  IMPRESSION: 1. Hypermetabolic focus at the GE junction consistent with know adenocarcinoma. 2. No evidence of metastatic adenopathy in the mediastinum or upper abdomen. 3. No evidence of liver metastasis.  No pulmonary metastasis.   06/06/2018 -  Chemotherapy   Weeklycarboplatin and Taxol, with concurrent radiationstarting on 06/06/2018-07/10/18   06/06/2018 - 07/16/2018 Radiation Therapy   Concurrent chemoRT with Dr. Harvel Ricks on 06/06/2018-07/16/18   11/06/2018 PET scan   PET 11/06/18  IMPRESSION: 1. No residual hypermetabolic activity at the gastroesophageal junction. 2. No evidence of metastatic disease.   12/22/2018 Procedure   Upper Endoscopy by Dr. Loletha Carrow 12/22/18  IMPRESSION - Esophageal mucosal changes secondary to established short-segment Barrett's disease. - Erythematous, friable (with contact bleeding), nodular mucosa in the esophagus. Biopsied. May be inflammation from radiation. - Erythematous and petechial mucosa in the gastric fundus and gastric body. Appears likely related to radiation. - Multiple fundic gland polyps. - Eroded and nodular mucosa in the antrum. Biopsied. - Submucosal nodule found in the duodenum. Stable in size from prior exams.    12/22/2018 Pathology Results   Diagnosis 12/22/18 1. Surgical [P], random gastric BX - CHRONIC INACTIVE GASTRITIS. - THERE IS NO EVIDENCE OF HELICOBACTER PYLORI, DYSPLASIA, OR MALIGNANCY. - SEE COMMENT. 2. Surgical [P], esophagus, GE junction - GASTROESOPHAGEAL JUNCTION MUCOSA WITH MILD INFLAMMATION CONSISTENT WITH GASTROESOPHAGEAL REFLUX. - THERE IS NO EVIDENCE OF GOBLET CELL METAPLASIA, DYSPLASIA OR MALIGNANCY. - SEE COMMENT.   07/08/2019 Procedure   Upper Endoscopy  by Dr. Loletha Carrow 07/08/19  IMPRESSION - Nodule found in the esophagus. Biopsied to rule out recurrent malignancy vs radiation effect. - Food (residue) in the stomach. - Retained food in the duodenum.   07/08/2019 Pathology Results   Diagnosis Surgical [P], distal esophagus - POORLY DIFFERENTIATED ADENOCARCINOMA WITH SIGNET RING FEATURES   07/25/2019 PET scan   IMPRESSION: 1. Signs of disease recurrence at the gastroesophageal junction with local nodal disease as described.   08/14/2019 -  Chemotherapy   The patient had palonosetron (ALOXI) injection 0.25 mg, 0.25 mg, Intravenous,  Once, 0 of 8 cycles oxaliplatin (ELOXATIN) 225 mg in dextrose 5 % 500 mL chemo infusion, 85 mg/m2 = 225 mg (original dose ), Intravenous,  Once, 0 of 8 cycles Dose modification: 85 mg/m2 (Cycle 1, Reason: Provider Judgment)  for chemotherapy treatment.       CURRENT THERAPY:  PENDING CAPOX q3weeks with oral Xeloda 2 weeks on/1 week off  INTERVAL HISTORY:  Philip [name] is here for a follow up of GEJ cancer. He presents to the clinic alone. He denies any current pain or difficultly swallowing. He notes he is eating well and gaining weight. He notes he has been his back pain is still his main pain. He notes he was on Tramadol previously and currently on hydrocodone which only helps some. He wondered if he could restart drinking alcohol to help has back pain.    REVIEW OF SYSTEMS:   Constitutional: Denies fevers, chills or abnormal weight loss (+) Weight gain  Eyes: Denies blurriness of vision Ears, nose, mouth, throat, and face: Denies mucositis or sore throat Respiratory: Denies cough, dyspnea or wheezes Cardiovascular: Denies palpitation, chest discomfort or lower extremity swelling Gastrointestinal:  Denies nausea, heartburn or change in bowel habits Skin: Denies abnormal skin rashes MSK: (+) back pain  Lymphatics: Denies new lymphadenopathy or easy bruising Neurological:Denies numbness, tingling or new  weaknesses Behavioral/Psych: Mood is stable, no new changes  All other systems were reviewed with the patient and are negative.  MEDICAL HISTORY:  Past Medical History:  Diagnosis Date  . Abdominal hernia   . Anemia   . Asthma   . Barrett's esophagus   . Cancer (HCC)    Esophagel cancer  . Cataract    Bil surgery  . Cough    over 2 weeks  . Fluid retention in legs   . GERD (gastroesophageal reflux disease)   . H/O hiatal hernia   . Hypertension   . Myasthenia gravis ([place])   . Sleep apnea    don't use c-pap at home/ tried for several years  . SOB (shortness of breath)     SURGICAL HISTORY: Past Surgical History:  Procedure Laterality Date  . BACK SURGERY     5th lumbar  . BALLOON DILATION N/A 10/19/2016   Procedure: BALLOON DILATION;  Surgeon: Ladene Artist, MD;  Location: [facility];  Service: Endoscopy;  Laterality: N/A;  . COLONOSCOPY W/ POLYPECTOMY    . ESOPHAGOGASTRODUODENOSCOPY N/A 10/19/2016   Procedure: ESOPHAGOGASTRODUODENOSCOPY (EGD);  Surgeon: Ladene Artist, MD;  Location: [facility];  Service: Endoscopy;  Laterality: N/A;  . ESOPHAGOGASTRODUODENOSCOPY (EGD) WITH PROPOFOL N/A 02/12/2018   Procedure: ESOPHAGOGASTRODUODENOSCOPY (EGD) WITH PROPOFOL;  Surgeon: [name], MD;  Location: [facility];  Service: Gastroenterology;  Laterality: N/A;  . EUS N/A 02/12/2018   Procedure: UPPER ENDOSCOPIC ULTRASOUND (EUS) RADIAL;  Surgeon: [name], MD;  Location: [facility];  Service: Gastroenterology;  Laterality: N/A;  . EYE SURGERY Bilateral  cataract  . LUMBAR LAMINECTOMY     1980's    I have reviewed the social history and family history with the patient and they are unchanged from previous note.  ALLERGIES:  has No Known Allergies.  MEDICATIONS:  Current Outpatient Medications  Medication Sig Dispense Refill  . ALBUTEROL IN Inhale 2 puffs into the lungs every 4 (four) hours as needed (shortness of breath or wheezing).      [name] amitriptyline (ELAVIL) 25 MG tablet Take 25 mg by mouth daily.     [name] aspirin EC 81 MG tablet Take 81 mg by mouth daily.    [name] atorvastatin (LIPITOR) 10 MG tablet Take 10 mg by mouth at bedtime.     . capecitabine (XELODA) 500 MG tablet Take 4 tabs every 12 hrs for 14 days, then off for 7 days, starts on the same day of IV chemo 112 tablet 0  . Esomeprazole Magnesium 20 MG TBEC Take 40 mg by mouth daily at 12 noon.     . etodolac (LODINE) 200 MG capsule TAKE ONE CAPSULE BY MOUTH EVERY 8 HOURS 30 capsule 2  . etodolac (LODINE) 500 MG tablet Take 500 mg by mouth 2 (two) times daily.    . furosemide (LASIX) 20 MG tablet Take 20 mg by mouth daily.    [name] HYDROcodone-acetaminophen (NORCO) 7.5-325 MG tablet Take 1 tablet by mouth 3 (three) times daily as needed.    [name] lisinopril (ZESTRIL) 20 MG tablet Take 20 mg by mouth daily.    . Multiple Vitamin (MULTIVITAMIN) tablet Take 1 tablet by mouth daily.    . mycophenolate (CELLCEPT) 500 MG tablet Take 2 tablets (1,000 mg total) by mouth 2 (two) times daily. 120 tablet 12  . pyridostigmine (MESTINON) 60 MG tablet Take 0.5-1 tablets (30-60 mg total) by mouth 3 (three) times daily. 90 tablet 12  . tamsulosin (FLOMAX) 0.4 MG CAPS capsule Take 1 capsule (0.4 mg total) by mouth daily after supper. 30 capsule 0   Current Facility-Administered Medications  Medication Dose Route Frequency Provider Last Rate Last Admin  . 0.9 %  sodium chloride infusion  500 mL Intravenous Once Nelida Meuse III, MD        PHYSICAL EXAMINATION: ECOG PERFORMANCE STATUS: 1 - Symptomatic but completely ambulatory  Vitals:   08/02/19 1524  BP: (!) 159/57  Pulse: 60  Resp: 20  Temp: 98.9 F (37.2 C)  SpO2: 98%   Filed Weights   08/02/19 1524  Weight: (!) 306 lb (138.8 kg)    Due to COVID19 we will limit examination to appearance. Patient had no complaints.  GENERAL:alert, no distress and comfortable SKIN: skin color normal, no rashes or significant lesions EYES:  normal, Conjunctiva are pink and non-injected, sclera clear  NEURO: alert & oriented x 3 with fluent speech   LABORATORY DATA:  I have reviewed the data as listed CBC Latest Ref Rng & Units 07/25/2019 05/13/2019 02/11/2019  WBC 4.0 - 10.5 K/uL 7.4 6.4 6.3  Hemoglobin 13.0 - 17.0 g/dL 13.7 13.5 12.3(L)  Hematocrit 39.0 - 52.0 % 40.8 40.1 37.4(L)  Platelets 150 - 400 K/uL 187 193 184     CMP Latest Ref Rng & Units 07/25/2019 05/13/2019 02/11/2019  Glucose 70 - 99 mg/dL 115(H) 116(H) 100(H)  BUN 8 - 23 mg/dL _0 Creatinine 0.61 - 1.24 mg/dL 0.82 0.90 1.02  Sodium 135 - 145 mmol/L 138 137 138  Potassium 3.5 - 5.1 mmol/L 4.2 4.3 4.7  Chloride 98 -  111 mmol/L 108 108 107  CO2 22 - 32 mmol/L _0 Calcium 8.9 - 10.3 mg/dL 10.3 10.5(H) 10.7(H)  Total Protein 6.5 - 8.1 g/dL 5.9(L) 6.2(L) 6.1(L)  Total Bilirubin 0.3 - 1.2 mg/dL 0.6 0.8 0.5  Alkaline Phos 38 - 126 U/L 84 83 80  AST 15 - 41 U/L 16 14(L) 15  ALT 0 - 44 U/L _1 RADIOGRAPHIC STUDIES: I have personally reviewed the radiological images as listed and agreed with the findings in the report. No results found.   ASSESSMENT & PLAN:  Philip [name] is a 74 y.o. male with    1. 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 -Due to his age and comorbidities, hewas felt to be a poorcandidate for surgery. Hewas previously seen by thoracic surgery Dr. Pia Mau. -He completed concurrent chemoRT with CT.Given he did not have surgery his risk of recurrence is more than 50%. Willcontinue with surveillance. His post-treatment PET and endoscopy in 12/2018 showed no residual disease  -His Upper endoscopy by Dr. Loletha Carrow from 07/08/19 showed nodule at distal esophagus/GEJ. His biopsy shows poorly differentiated adenocarcinoma,  appears early stage cancer recurrence. This could be residual disease or local recurrence, he is currently asymptomatic.  -I reviewed and discussed his PET from 07/25/19 with pt in person which shows regional node metastasis but no distant metastasis. Overall only local disease.  -I discussed Radiation was just last year and is not recommended to repeat. I discussed treatment with chemotherapy to control his disease. I recommend chemo FOLFOX q2weeks or CAPOX q3weeks with oral Xeloda 2 weeks on/1 week off. He opted to try CAPOX first and switch to FOLFOX if not tolerable. After at least 3 months if he has good response, I may start maintenance therapy with oral Xeloda alone.   --Chemotherapy consent: Side effects including but does not limited to, fatigue, nausea, vomiting, diarrhea, hair loss, cold sensitivity, neuropathy, fluid retention, renal and kidney dysfunction, neutropenic fever, needed for blood transfusion, bleeding, were discussed with patient in great detail. He agrees to proceed. Plan to start CAPOX in 2 weeks.  -The goal of therapy is palliative to prolong his life and preserve his quality of life -His tumor was negative.  Unfortunately due t for HER-2 expressio.  PD-L1 was positive at 10%, unfortunately due to his myasthenia gravis, he is not a candidate for immunotherapy. FO genomic testing results still pending -I discussed PAC placement. He declined at this time and willing to use peripheral access.  -F/u in 2 weeks   3.Chronic back pain  -Continue toF/uwith pain specialist -Ipreviouslysentclearance note tohis orthopedicsur[facility]. -He was previously on Tramadol, now he is on Hydrocodone which only helps his pain some.  -I recommend he not use alcohol to help pain.   4. HTN -Was onlisinopril. Due to hypotension Ipreviouslyinstructed him to hold lisinopriland add salt to diet -F/u  with PCP. Has been normal lately.  5.Myasthenia gravis -Oncellcept [id number] longer term immunosuppression -He is at high risk for infections due topriorchemo -Stablewith fatigue  6. Hypercalcemia,primary hyperparathyroidism -He takes Vitamin D. He is fine to take multivitamin that contains low dose calcium.I advised him to avoid high dose calcium. -Labshavesupportedprimary hyperparathyroidism.F/u wit hPCP about this. -Ca normal recently  7. Goal of care discussion  -We again discussed the incurable nature of his cancer, and the overall poor prognosis, especially if he does not have good response to chemotherapy or progress on chemo -The patient understands the goal of care is palliative. -He is full code now    Plan -PET scan reviewed -I will call in Xeloda today  -Lab, f/u and Oxaliplatin in about 2 weeks, will reduce first dose to 70m/m2 to see how he tolerates  -Chemo education class in 1-2 weeks  -f/u FO result    No problem-specific Assessment & Plan notes found for this encounter.   No orders of the defined types were placed in this encounter.  All questions were answered. The patient knows to call the clinic with any problems, questions or concerns. No barriers to learning was detected. The total time spent in the appointment was 30 minutes.     YTruitt Merle MD 08/02/2019   I, AJoslyn Devon am acting as scribe for YTruitt Merle MD.   I have reviewed the above documentation for accuracy and completeness, and I agree with the above.

## 2019-07-25 ENCOUNTER — Encounter (HOSPITAL_COMMUNITY)
Admission: RE | Admit: 2019-07-25 | Discharge: 2019-07-25 | Disposition: A | Discharge status: Home / Self Care | Payer: Medicare Other | Source: Ambulatory Visit | Attending: Hematology | Admitting: Hematology

## 2019-07-25 ENCOUNTER — Other Ambulatory Visit: Payer: Self-pay

## 2019-07-25 ENCOUNTER — Inpatient Hospital Stay: Payer: Medicare Other

## 2019-07-25 DIAGNOSIS — C160 Malignant neoplasm of cardia: Secondary | ICD-10-CM | POA: Diagnosis not present

## 2019-07-25 DIAGNOSIS — M549 Dorsalgia, unspecified: Secondary | ICD-10-CM | POA: Diagnosis not present

## 2019-07-25 DIAGNOSIS — K2270 Barrett's esophagus without dysplasia: Secondary | ICD-10-CM | POA: Diagnosis not present

## 2019-07-25 DIAGNOSIS — Z79899 Other long term (current) drug therapy: Secondary | ICD-10-CM | POA: Diagnosis not present

## 2019-07-25 DIAGNOSIS — E210 Primary hyperparathyroidism: Secondary | ICD-10-CM | POA: Diagnosis not present

## 2019-07-25 DIAGNOSIS — D84821 Immunodeficiency due to drugs: Secondary | ICD-10-CM | POA: Diagnosis not present

## 2019-07-25 DIAGNOSIS — I10 Essential (primary) hypertension: Secondary | ICD-10-CM | POA: Diagnosis not present

## 2019-07-25 DIAGNOSIS — Z9221 Personal history of antineoplastic chemotherapy: Secondary | ICD-10-CM | POA: Diagnosis not present

## 2019-07-25 DIAGNOSIS — G4730 Sleep apnea, unspecified: Secondary | ICD-10-CM | POA: Diagnosis not present

## 2019-07-25 DIAGNOSIS — K219 Gastro-esophageal reflux disease without esophagitis: Secondary | ICD-10-CM | POA: Diagnosis not present

## 2019-07-25 DIAGNOSIS — G8929 Other chronic pain: Secondary | ICD-10-CM | POA: Diagnosis not present

## 2019-07-25 DIAGNOSIS — J45909 Unspecified asthma, uncomplicated: Secondary | ICD-10-CM | POA: Diagnosis not present

## 2019-07-25 DIAGNOSIS — G7000 Myasthenia gravis without (acute) exacerbation: Secondary | ICD-10-CM | POA: Diagnosis not present

## 2019-07-25 DIAGNOSIS — Z923 Personal history of irradiation: Secondary | ICD-10-CM | POA: Diagnosis not present

## 2019-07-25 LAB — CBC WITH DIFFERENTIAL (CANCER CENTER ONLY)
Abs Immature Granulocytes: 0.05 10*3/uL (ref 0.00–0.07)
Basophils Absolute: 0.1 10*3/uL (ref 0.0–0.1)
Basophils Relative: 1 %
Eosinophils Absolute: 0.2 10*3/uL (ref 0.0–0.5)
Eosinophils Relative: 2 %
HCT: 40.8 % (ref 39.0–52.0)
Hemoglobin: 13.7 g/dL (ref 13.0–17.0)
Immature Granulocytes: 1 %
Lymphocytes Relative: 13 %
Lymphs Abs: 0.9 10*3/uL (ref 0.7–4.0)
MCH: 31.8 pg (ref 26.0–34.0)
MCHC: 33.6 g/dL (ref 30.0–36.0)
MCV: 94.7 fL (ref 80.0–100.0)
Monocytes Absolute: 0.8 10*3/uL (ref 0.1–1.0)
Monocytes Relative: 11 %
Neutro Abs: 5.4 10*3/uL (ref 1.7–7.7)
Neutrophils Relative %: 72 %
Platelet Count: 187 10*3/uL (ref 150–400)
RBC: 4.31 MIL/uL (ref 4.22–5.81)
RDW: 13.3 % (ref 11.5–15.5)
WBC Count: 7.4 10*3/uL (ref 4.0–10.5)
nRBC: 0 % (ref 0.0–0.2)

## 2019-07-25 LAB — GLUCOSE, CAPILLARY: Glucose-Capillary: 107 mg/dL — ABNORMAL HIGH (ref 70–99)

## 2019-07-25 LAB — CMP (CANCER CENTER ONLY)
ALT: 21 U/L (ref 0–44)
AST: 16 U/L (ref 15–41)
Albumin: 3.5 g/dL (ref 3.5–5.0)
Alkaline Phosphatase: 84 U/L (ref 38–126)
Anion gap: 8 (ref 5–15)
BUN: 17 mg/dL (ref 8–23)
CO2: 22 mmol/L (ref 22–32)
Calcium: 10.3 mg/dL (ref 8.9–10.3)
Chloride: 108 mmol/L (ref 98–111)
Creatinine: 0.82 mg/dL (ref 0.61–1.24)
GFR, Est AFR Am: >60 mL/min (ref >60)
GFR, Estimated: >60 mL/min (ref >60)
Glucose, Bld: 115 mg/dL — ABNORMAL HIGH (ref 70–99)
Potassium: 4.2 mmol/L (ref 3.5–5.1)
Sodium: 138 mmol/L (ref 135–145)
Total Bilirubin: 0.6 mg/dL (ref 0.3–1.2)
Total Protein: 5.9 g/dL — ABNORMAL LOW (ref 6.5–8.1)

## 2019-07-25 LAB — CEA (IN HOUSE-CHCC): CEA (CHCC-In House): 3.79 ng/mL (ref 0.00–5.00)

## 2019-07-25 MED ORDER — FLUDEOXYGLUCOSE F - 18 (FDG) INJECTION
14.7100 | Freq: Once | INTRAVENOUS | Status: AC
  Administered 2019-07-25: 14.71 via INTRAVENOUS

## 2019-07-30 ENCOUNTER — Other Ambulatory Visit: Payer: Medicare Other

## 2019-08-02 ENCOUNTER — Encounter: Payer: Self-pay | Admitting: Hematology

## 2019-08-02 ENCOUNTER — Telehealth: Payer: Self-pay | Admitting: Hematology

## 2019-08-02 ENCOUNTER — Inpatient Hospital Stay: Payer: Medicare Other | Attending: Nurse Practitioner | Admitting: Hematology

## 2019-08-02 ENCOUNTER — Other Ambulatory Visit: Payer: Self-pay

## 2019-08-02 VITALS — BP 159/57 | HR 60 | Temp 98.9°F | Resp 20 | Ht 72.0 in | Wt 306.0 lb

## 2019-08-02 DIAGNOSIS — M549 Dorsalgia, unspecified: Secondary | ICD-10-CM | POA: Insufficient documentation

## 2019-08-02 DIAGNOSIS — Z5111 Encounter for antineoplastic chemotherapy: Secondary | ICD-10-CM | POA: Diagnosis present

## 2019-08-02 DIAGNOSIS — E210 Primary hyperparathyroidism: Secondary | ICD-10-CM | POA: Insufficient documentation

## 2019-08-02 DIAGNOSIS — C160 Malignant neoplasm of cardia: Secondary | ICD-10-CM | POA: Diagnosis present

## 2019-08-02 DIAGNOSIS — Z79899 Other long term (current) drug therapy: Secondary | ICD-10-CM | POA: Insufficient documentation

## 2019-08-02 DIAGNOSIS — G8929 Other chronic pain: Secondary | ICD-10-CM | POA: Diagnosis not present

## 2019-08-02 DIAGNOSIS — G7000 Myasthenia gravis without (acute) exacerbation: Secondary | ICD-10-CM | POA: Diagnosis not present

## 2019-08-02 DIAGNOSIS — Z7189 Other specified counseling: Secondary | ICD-10-CM | POA: Insufficient documentation

## 2019-08-02 DIAGNOSIS — I10 Essential (primary) hypertension: Secondary | ICD-10-CM | POA: Insufficient documentation

## 2019-08-02 MED ORDER — CAPECITABINE 500 MG PO TABS: ORAL_TABLET | ORAL | 0 refills | Status: DC

## 2019-08-02 NOTE — Progress Notes (Signed)
DISCONTINUE ON PATHWAY REGIMEN - Gastroesophageal     Administer weekly during RT:     Paclitaxel      Carboplatin   **Always confirm dose/schedule in your pharmacy ordering system**  REASON: Disease Progression PRIOR TREATMENT: [id number]: Carboplatin + Paclitaxel (2/50) Weekly (x 5-6 Weeks) with Concurrent RT TREATMENT RESPONSE: Partial Response (PR)  START OFF PATHWAY REGIMEN - Gastroesophageal   [id number]:Capecitabine + Oxaliplatin (1000/130):   A cycle is every 21 days:     Capecitabine      Oxaliplatin   **Always confirm dose/schedule in your pharmacy ordering system**  Patient Characteristics: Distant Metastases (cM1/pM1) / Locally Recurrent Disease, Adenocarcinoma - Esophageal, GE Junction, and Gastric, First Line, HER2 Negative / Unknown Histology: Adenocarcinoma Disease Classification: GE Junction Therapeutic Status: Local Recurrence (No Additional Staging) Line of Therapy: First Line HER2 Status: Awaiting Test Results Intent of Therapy: Non-Curative / Palliative Intent, Discussed with Patient

## 2019-08-02 NOTE — Telephone Encounter (Signed)
Scheduled per 04/02 los, called patient and left a voicemail.

## 2019-08-05 ENCOUNTER — Telehealth: Payer: Self-pay | Admitting: Pharmacy Technician

## 2019-08-05 ENCOUNTER — Telehealth: Payer: Self-pay | Admitting: Pharmacist

## 2019-08-05 DIAGNOSIS — C160 Malignant neoplasm of cardia: Secondary | ICD-10-CM

## 2019-08-05 MED ORDER — XELODA 500 MG PO TABS: 2000.0000 mg | ORAL_TABLET | Freq: Two times a day (BID) | ORAL | 0 refills | Status: DC

## 2019-08-05 NOTE — Telephone Encounter (Signed)
Oral Oncology Patient Advocate Encounter  After completing a benefits investigation, prior authorization for Xeloda is not required at this time through Medicare B and supplement.    [employer] supplement COBA number for Medicare is [id number].  Patient's copay is $0.00.  [facility] Patient [place] Phone [phone] Fax [phone] 08/05/2019 1:16 PM

## 2019-08-05 NOTE — Telephone Encounter (Signed)
Oral Oncology Pharmacist Encounter  Received new prescription for Xeloda (capecitabine) for the treatment of adenocarcinoma of the GE junction in conjunction with oxaliplatin, planned duration until disease progression or unacceptable drug toxicity.  CMP from 07/25/19 assessed, no relevant lab abnormalities. Prescription dose and frequency assessed.   Current medication list in Epic reviewed, one DDIs with capecitabine identified: -Esomeprazole: Proton Pump Inhibitors (PPI) may diminish the therapeutic effect of capecitabine. Recommend evaluating the need for a PPI/acid suppression. If acid suppression is needed, recommend switching to a H2 antagonist (eg, famotidine) to avoid this DDI.   Prescription has been e-scribed to the [facility] for benefits analysis and approval.  Oral Oncology Clinic will continue to follow for insurance authorization, copayment issues, initial counseling and start date.  Darl Pikes, PharmD, BCPS, BCOP, CPP Hematology/Oncology Clinical Pharmacist [facility]/AP [facility] [phone]  08/05/2019 10:06 AM

## 2019-08-07 MED FILL — XELODA 500 MG TABLET: 500 | 21 days supply | Qty: 112 | Fill #0

## 2019-08-09 NOTE — Progress Notes (Signed)
Pharmacist Chemotherapy Monitoring - Initial Assessment    Anticipated start date: 4/14/121  Regimen:  . Are orders appropriate based on the patient's diagnosis, regimen, and cycle? Yes . Does the plan date match the patient's scheduled date? Yes . Is the sequencing of drugs appropriate? Yes . Are the premedications appropriate for the patient's regimen? Yes . Prior Authorization for treatment is: Approved o If applicable, is the correct biosimilar selected based on the patient's insurance? not applicable  Organ Function and Labs: [name] Are dose adjustments needed based on the patient's renal function, hepatic function, or hematologic function? No . Are appropriate labs ordered prior to the start of patient's treatment? Yes . Other organ system assessment, if indicated: N/A . The following baseline labs, if indicated, have been ordered: N/A  Dose Assessment: . Are the drug doses appropriate? Yes . Are the following correct: o Drug concentrations Yes o IV fluid compatible with drug Yes o Administration routes Yes o Timing of therapy Yes . If applicable, does the patient have documented access for treatment and/or plans for port-a-cath placement? no . If applicable, have lifetime cumulative doses been properly documented and assessed? not applicable Lifetime Dose Tracking  . Carboplatin: 1,480 mg = 0.01 % of the maximum lifetime dose of 999,999,999 mg  o   Toxicity Monitoring/Prevention: . The patient has the following take home antiemetics prescribed: N/A . The patient has the following take home medications prescribed: N/A . Medication allergies and previous infusion related reactions, if applicable, have been reviewed and addressed. Yes . The patient's current medication list has been assessed for drug-drug interactions with their chemotherapy regimen. no significant drug-drug interactions were identified on review.  Order Review: . Are the treatment plan orders signed? Yes . Is  the patient scheduled to see a provider prior to their treatment? Yes  I verify that I have reviewed each item in the above checklist and answered each question accordingly.  Romualdo Bolk [facility] 08/09/2019 10:48 AM

## 2019-08-12 NOTE — Progress Notes (Signed)
[place]   [phone] [phone]   Clinic Follow up Note   Patient Care Team: [name], MD as PCP - General (Family Medicine) Mansouraty, [name], MD as Consulting Physician (Gastroenterology) Zonia Kief, MD (Rehabilitation) 08/14/2019  CHIEF COMPLAINT: F/u cancer of GE junction   SUMMARY OF ONCOLOGIC HISTORY: Oncology History Overview Note  Cancer Staging Malignant neoplasm of gastroesophageal junction [facility]) Staging form: Esophagus - Adenocarcinoma, AJCC 8th Edition - Clinical stage from 02/26/2018: Stage IIB (cT2, cN0, cM0, G3) - Signed by [name], NP on 02/26/2018    Malignant neoplasm of gastroesophageal junction ([place])  01/22/2018 Initial Biopsy   Diagnosis Surgical [P], GE junction nodule BX - POORLY 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   01/22/2018 Procedure   COLONOSCOPY IMPRESSION - Redundant colon. - The examination was otherwise normal on direct and retroflexion views. - No specimens collected.  UPPER ENDOSCOPY IMPRESSION: - Esophageal mucosal changes secondary to established short-segment Barrett's disease. - Mucosal nodule found in the esophagus. Biopsied. - Erythematous mucosa in the prepyloric region of the stomach. - Submucosal nodule found in the duodenum.    02/01/2018 Initial Diagnosis   Malignant neoplasm of gastroesophageal junction ([place])   02/05/2018 Imaging   CT CAP IMPRESSION: 1. No esophageal primary identified. No findings of metastatic disease in the chest, abdomen, or pelvis. 2.  Aortic Atherosclerosis (ICD10-I70.0).   02/12/2018 Procedure   EUS Impression: - A mass was found in the distal esophagus into the gastroesophageal junction and extending to proximal cardia. A tissue diagnosis was obtained prior to this exam consistent with adenocarcinoma. This was staged at T2N0Mx, because of a loss of interface of the muscularis  propria as the distal GE Junction becomes the cardia and the mass moves into this region, but there are many more regions and images that suggest a T1 lesion into the submucosa.   05/29/2018 Imaging   NM PET Image Initial (PI) Skull Base To Thigh  IMPRESSION: 1. Hypermetabolic focus at the GE junction consistent with know adenocarcinoma. 2. No evidence of metastatic adenopathy in the mediastinum or upper abdomen. 3. No evidence of liver metastasis.  No pulmonary metastasis.   06/06/2018 -  Chemotherapy   Weeklycarboplatin and Taxol, with concurrent radiationstarting on 06/06/2018-07/10/18   06/06/2018 - 07/16/2018 Radiation Therapy   Concurrent chemoRT with Dr. Harvel Ricks on 06/06/2018-07/16/18   11/06/2018 PET scan   PET 11/06/18  IMPRESSION: 1. No residual hypermetabolic activity at the gastroesophageal junction. 2. No evidence of metastatic disease.   12/22/2018 Procedure   Upper Endoscopy by Dr. Loletha Carrow 12/22/18  IMPRESSION - Esophageal mucosal changes secondary to established short-segment Barrett's disease. - Erythematous, friable (with contact bleeding), nodular mucosa in the esophagus. Biopsied. May be inflammation from radiation. - Erythematous and petechial mucosa in the gastric fundus and gastric body. Appears likely related to radiation. - Multiple fundic gland polyps. - Eroded and nodular mucosa in the antrum. Biopsied. - Submucosal nodule found in the duodenum. Stable in size from prior exams.    12/22/2018 Pathology Results   Diagnosis 12/22/18 1. Surgical [P], random gastric BX - CHRONIC INACTIVE GASTRITIS. - THERE IS NO EVIDENCE OF HELICOBACTER PYLORI, DYSPLASIA, OR MALIGNANCY. - SEE COMMENT. 2. Surgical [P], esophagus, GE junction - GASTROESOPHAGEAL JUNCTION MUCOSA WITH MILD INFLAMMATION CONSISTENT WITH GASTROESOPHAGEAL REFLUX. - THERE IS NO EVIDENCE OF GOBLET CELL METAPLASIA, DYSPLASIA OR MALIGNANCY. - SEE COMMENT.   07/08/2019 Procedure   Upper Endoscopy by Dr.  Loletha Carrow  07/08/19  IMPRESSION - Nodule found in the esophagus. Biopsied to rule out recurrent malignancy vs radiation effect. - Food (residue) in the stomach. - Retained food in the duodenum.   07/08/2019 Pathology Results   Diagnosis Surgical [P], distal esophagus - POORLY DIFFERENTIATED ADENOCARCINOMA WITH SIGNET RING FEATURES   07/25/2019 PET scan   IMPRESSION: 1. Signs of disease recurrence at the gastroesophageal junction with local nodal disease as described.   08/14/2019 -  Chemotherapy   The patient had palonosetron (ALOXI) injection 0.25 mg, 0.25 mg, Intravenous,  Once, 1 of 8 cycles oxaliplatin (ELOXATIN) 225 mg in dextrose 5 % 500 mL chemo infusion, 85 mg/m2 = 225 mg (100 % of original dose 85 mg/m2), Intravenous,  Once, 1 of 8 cycles Dose modification: 85 mg/m2 (original dose 85 mg/m2, Cycle 1, Reason: Provider Judgment) fosaprepitant (EMEND) 150 mg in sodium chloride 0.9 % 145 mL IVPB, 150 mg, Intravenous,  Once, 1 of 8 cycles  for chemotherapy treatment.      CURRENT THERAPY:  PENDING CAPOX q3weeks with oral Xeloda 2 weeks on/1 week off, starting 08/14/19   INTERVAL HISTORY: Philip [name] returns for f/u and treatment as scheduled. He began 2000 mg Xeloda this morning. He is feeling good. Able to be up and active at home. Appetite is good. Denies dysphagia or odynophagia, no n/v/c/d. He has sinus trouble. Denies fever, chills, cough, chest pain, dyspnea, leg pain. Denies abdominal pain. He has chronic back pain at baseline. No other concerns today.    MEDICAL HISTORY:  Past Medical History:  Diagnosis Date  . Abdominal hernia   . Anemia   . Asthma   . Barrett's esophagus   . Cancer (HCC)    Esophagel cancer  . Cataract    Bil surgery  . Cough    over 2 weeks  . Fluid retention in legs   . GERD (gastroesophageal reflux disease)   . H/O hiatal hernia   . Hypertension   . Myasthenia gravis ([place])   . Sleep apnea    don't use c-pap at home/ tried for several years  .  SOB (shortness of breath)     SURGICAL HISTORY: Past Surgical History:  Procedure Laterality Date  . BACK SURGERY     5th lumbar  . BALLOON DILATION N/A 10/19/2016   Procedure: BALLOON DILATION;  Surgeon: Ladene Artist, MD;  Location: [facility];  Service: Endoscopy;  Laterality: N/A;  . COLONOSCOPY W/ POLYPECTOMY    . ESOPHAGOGASTRODUODENOSCOPY N/A 10/19/2016   Procedure: ESOPHAGOGASTRODUODENOSCOPY (EGD);  Surgeon: Ladene Artist, MD;  Location: [facility];  Service: Endoscopy;  Laterality: N/A;  . ESOPHAGOGASTRODUODENOSCOPY (EGD) WITH PROPOFOL N/A 02/12/2018   Procedure: ESOPHAGOGASTRODUODENOSCOPY (EGD) WITH PROPOFOL;  Surgeon: [name], MD;  Location: [facility];  Service: Gastroenterology;  Laterality: N/A;  . EUS N/A 02/12/2018   Procedure: UPPER ENDOSCOPIC ULTRASOUND (EUS) RADIAL;  Surgeon: [name], MD;  Location: [facility];  Service: Gastroenterology;  Laterality: N/A;  . EYE SURGERY Bilateral    cataract  . LUMBAR LAMINECTOMY     1980's    I have reviewed the social history and family history with the patient and they are unchanged from previous note.  ALLERGIES:  has No Known Allergies.  MEDICATIONS:  Current Outpatient Medications  Medication Sig Dispense Refill  . ALBUTEROL IN Inhale 2 puffs into the lungs every 4 (four) hours as needed (shortness of breath or wheezing).     [name] amitriptyline (ELAVIL) 25 MG tablet Take 25 mg  by mouth daily.     [name] aspirin EC 81 MG tablet Take 81 mg by mouth daily.    [name] atorvastatin (LIPITOR) 10 MG tablet Take 10 mg by mouth at bedtime.     [name] etodolac (LODINE) 200 MG capsule TAKE ONE CAPSULE BY MOUTH EVERY 8 HOURS 30 capsule 2  . etodolac (LODINE) 500 MG tablet Take 500 mg by mouth 2 (two) times daily.    [name] HYDROcodone-acetaminophen (NORCO) 7.5-325 MG tablet Take 1 tablet by mouth 3 (three) times daily as needed.    [name] lisinopril (ZESTRIL) 20 MG tablet Take 20 mg by mouth daily.    . Multiple  Vitamin (MULTIVITAMIN) tablet Take 1 tablet by mouth daily.    . mycophenolate (CELLCEPT) 500 MG tablet Take 2 tablets (1,000 mg total) by mouth 2 (two) times daily. 120 tablet 12  . pyridostigmine (MESTINON) 60 MG tablet Take 0.5-1 tablets (30-60 mg total) by mouth 3 (three) times daily. 90 tablet 12  . tamsulosin (FLOMAX) 0.4 MG CAPS capsule Take 1 capsule (0.4 mg total) by mouth daily after supper. 30 capsule 0  . XELODA 500 MG tablet Take 4 tablets (2,000 mg total) by mouth 2 (two) times daily after a meal. Take for 14 days, then hold for 7 days. Repeat every 21 days. 112 tablet 0  . Esomeprazole Magnesium 20 MG TBEC Take 40 mg by mouth daily at 12 noon.     . furosemide (LASIX) 20 MG tablet Take 20 mg by mouth daily.    . ondansetron (ZOFRAN) 8 MG tablet Take 1 tablet (8 mg total) by mouth 2 (two) times daily as needed for refractory nausea / vomiting. Start on day 3 after chemotherapy. 30 tablet 1  . prochlorperazine (COMPAZINE) 10 MG tablet Take 1 tablet (10 mg total) by mouth every 6 (six) hours as needed (Nausea or vomiting). 30 tablet 1   Current Facility-Administered Medications  Medication Dose Route Frequency Provider Last Rate Last Admin  . 0.9 %  sodium chloride infusion  500 mL Intravenous Once Doran Stabler, MD       Facility-Administered Medications Ordered in Other Visits  Medication Dose Route Frequency Provider Last [place]  . oxaliplatin (ELOXATIN) 225 mg in dextrose 5 % 500 mL chemo infusion  85 mg/m2 (Treatment Plan Recorded) Intravenous Once Truitt Merle, MD        PHYSICAL EXAMINATION: ECOG PERFORMANCE STATUS: 1 - Symptomatic but completely ambulatory  Vitals:   08/14/19 0819  BP: 139/68  Pulse: 85  Resp: 18  Temp: 98.3 F (36.8 C)  SpO2: 98%   Filed Weights   08/14/19 0819  Weight: (!) 303 lb 8 oz (137.7 kg)    GENERAL:alert, no distress and comfortable SKIN: no rash  EYES: sclera clear LUNGS:  normal breathing effort HEART: trace lower  extremity edema ABDOMEN:abdomen round NEURO: alert & oriented x 3 with fluent speech, normal gait Limited exam for COVID19 pandemic and patient had no concerns   LABORATORY DATA:  I have reviewed the data as listed CBC Latest Ref Rng & Units 08/14/2019 07/25/2019 05/13/2019  WBC 4.0 - 10.5 K/uL 6.4 7.4 6.4  Hemoglobin 13.0 - 17.0 g/dL 13.5 13.7 13.5  Hematocrit 39.0 - 52.0 % 41.1 40.8 40.1  Platelets 150 - 400 K/uL 194 187 193     CMP Latest Ref Rng & Units 08/14/2019 07/25/2019 05/13/2019  Glucose 70 - 99 mg/dL 145(H) 115(H) 116(H)  BUN 8 - 23 mg/dL 15 17 21  Creatinine 0.61 - 1.24 mg/dL 1.01 0.82 0.90  Sodium 135 - 145 mmol/L 140 138 137  Potassium 3.5 - 5.1 mmol/L 4.8 4.2 4.3  Chloride 98 - 111 mmol/L 113(H) 108 108  CO2 22 - 32 mmol/L 18(L) 22 22  Calcium 8.9 - 10.3 mg/dL 10.6(H) 10.3 10.5(H)  Total Protein 6.5 - 8.1 g/dL 6.3(L) 5.9(L) 6.2(L)  Total Bilirubin 0.3 - 1.2 mg/dL 0.6 0.6 0.8  Alkaline Phos 38 - 126 U/L 81 84 83  AST 15 - 41 U/L 19 16 14(L)  ALT 0 - 44 U/L _0 RADIOGRAPHIC STUDIES: I have personally reviewed the radiological images as listed and agreed with the findings in the report. No results found.   ASSESSMENT & PLAN: Philip [name] is a 74 y.o. male with    1. 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 -Due to his age and comorbidities, hewas felt to be a poorcandidate for surgery. Hewas previously seen by thoracic surgery Dr. Pia Mau. -He completed concurrent chemoRT with CT.Given he did not have surgery his risk of recurrence is more than 50%. He was on surveillance.His post-treatment PET and endoscopy in 12/2018 showed no residual disease -His Upper endoscopy by Dr. Loletha Carrow from 07/08/19 showed nodule at distal esophagus/GEJ.  Hisbiopsyshows poorly differentiated adenocarcinoma, appears early stage cancer recurrence. This could be residual disease or local recurrence, he is currently asymptomatic. Dr. Burr Medico previously reviewed this. -PET from 07/25/19 shows regional node metastasis but no distant metastasis. Overall only local disease.  -It was not recommended to repeat radiation given that it was just done last year. Dr. Burr Medico recommended systemic chemotherapy to control his disease, FOLFOX vs CAPOX were discussed. He opted to try CAPOX first and switch to FOLFOX if not tolerable. After at least 3 months if he has good response, may consider maintenance therapy with oral Xeloda alone.  -The goal of therapy is palliative to prolong his life and preserve his quality of life -His tumor was HER2 (-) negative.  PD-L1 was positive at 10%, unfortunately due to his myasthenia gravis, he is not a candidate for immunotherapy. FO genomic testing results still pending -He declined PAC for now -Cycle 1 CAPOX with dose reduced oxali 08/14/19  3.Chronic back pain  -Continue toF/uwith pain specialist, on Norco  -Dr. Burr Medico previouslysentclearance note [facility]his [facility]ur[facility] -stable  4. HTN -on Lisinopril, f/u with PCP -BP 139/68 today   5.Myasthenia gravis -Oncellcept 1075mBIDfor longer term immunosuppression, and mestinon TID  -He is at high risk for infections due topriorchemo -Stablewith fatigue  6. Hypercalcemia,primary hyperparathyroidism -He takes Vitamin D and multivitamin, he has been advised to avoid additional calcium supplement  -Labshavesupportedprimary hyperparathyroidism.F/u wit hPCP about this. -Ca 10.5 today   7. Goal of care discussion  -The patient understands the goal of care is palliative. -He is full code now    Disposition:  Mr. CSchnabelappears stable. Labs reviewed. He will proceed with  cycle 1 CAPOX today with oxaliplatin dose reduced to 85 mg/m2. He will take Xeloda 2000 mg BID x14 days then 7 days off. If he tolerates well, may increase Oxali on subsequent cycles. Given his myasthenia gravis, will avoid high dose steroids. We will reduce his pre-med dex to 2 mg and will add  Emend. I reviewed with pharmacy.   We again reviewed the rationale and goal of therapy, potential side effects, symptom management, and s/sx to call to report such as severe fatigue, difficulties with po intake, severe hand/foot syndrome, mucositis that limites eating/drinking, uncontrolled n/v/c/d, fever, chills, rash, or changes in respiratory status. I sent in compazine and zofran and reviewed instructions. We will f/u by phone next week for toxicity check. He will return for f/u and cycle 2 in 3 weeks.   All questions were answered. The patient knows to call the clinic with any problems, questions or concerns. No barriers to learning was detected. I reviewed the plan with Dr. Burr Medico.  Total encounter time was 30 minutes.      [name], NP 08/14/19

## 2019-08-13 MED FILL — Dexamethasone Sodium Phosphate Inj 100 MG/10ML: INTRAMUSCULAR | Qty: 1 | Status: AC

## 2019-08-14 ENCOUNTER — Inpatient Hospital Stay (HOSPITAL_BASED_OUTPATIENT_CLINIC_OR_DEPARTMENT_OTHER): Payer: Medicare Other | Admitting: Nurse Practitioner

## 2019-08-14 ENCOUNTER — Inpatient Hospital Stay: Payer: Medicare Other

## 2019-08-14 ENCOUNTER — Other Ambulatory Visit: Payer: Self-pay

## 2019-08-14 ENCOUNTER — Encounter: Payer: Self-pay | Admitting: Nurse Practitioner

## 2019-08-14 ENCOUNTER — Other Ambulatory Visit: Payer: Self-pay | Admitting: Hematology

## 2019-08-14 VITALS — BP 139/68 | HR 85 | Temp 98.3°F | Resp 18 | Ht 72.0 in | Wt 303.5 lb

## 2019-08-14 DIAGNOSIS — C160 Malignant neoplasm of cardia: Secondary | ICD-10-CM

## 2019-08-14 DIAGNOSIS — Z5111 Encounter for antineoplastic chemotherapy: Secondary | ICD-10-CM | POA: Diagnosis not present

## 2019-08-14 LAB — CMP (CANCER CENTER ONLY)
ALT: 23 U/L (ref 0–44)
AST: 19 U/L (ref 15–41)
Albumin: 3.6 g/dL (ref 3.5–5.0)
Alkaline Phosphatase: 81 U/L (ref 38–126)
Anion gap: 9 (ref 5–15)
BUN: 15 mg/dL (ref 8–23)
CO2: 18 mmol/L — ABNORMAL LOW (ref 22–32)
Calcium: 10.6 mg/dL — ABNORMAL HIGH (ref 8.9–10.3)
Chloride: 113 mmol/L — ABNORMAL HIGH (ref 98–111)
Creatinine: 1.01 mg/dL (ref 0.61–1.24)
GFR, Est AFR Am: >60 mL/min (ref >60)
GFR, Estimated: >60 mL/min (ref >60)
Glucose, Bld: 145 mg/dL — ABNORMAL HIGH (ref 70–99)
Potassium: 4.8 mmol/L (ref 3.5–5.1)
Sodium: 140 mmol/L (ref 135–145)
Total Bilirubin: 0.6 mg/dL (ref 0.3–1.2)
Total Protein: 6.3 g/dL — ABNORMAL LOW (ref 6.5–8.1)

## 2019-08-14 LAB — CBC WITH DIFFERENTIAL (CANCER CENTER ONLY)
Abs Immature Granulocytes: 0.03 10*3/uL (ref 0.00–0.07)
Basophils Absolute: 0.1 10*3/uL (ref 0.0–0.1)
Basophils Relative: 1 %
Eosinophils Absolute: 0.2 10*3/uL (ref 0.0–0.5)
Eosinophils Relative: 3 %
HCT: 41.1 % (ref 39.0–52.0)
Hemoglobin: 13.5 g/dL (ref 13.0–17.0)
Immature Granulocytes: 1 %
Lymphocytes Relative: 17 %
Lymphs Abs: 1.1 10*3/uL (ref 0.7–4.0)
MCH: 32.1 pg (ref 26.0–34.0)
MCHC: 32.8 g/dL (ref 30.0–36.0)
MCV: 97.6 fL (ref 80.0–100.0)
Monocytes Absolute: 0.7 10*3/uL (ref 0.1–1.0)
Monocytes Relative: 11 %
Neutro Abs: 4.3 10*3/uL (ref 1.7–7.7)
Neutrophils Relative %: 67 %
Platelet Count: 194 10*3/uL (ref 150–400)
RBC: 4.21 MIL/uL — ABNORMAL LOW (ref 4.22–5.81)
RDW: 14.3 % (ref 11.5–15.5)
WBC Count: 6.4 10*3/uL (ref 4.0–10.5)
nRBC: 0 % (ref 0.0–0.2)

## 2019-08-14 LAB — CEA (IN HOUSE-CHCC): CEA (CHCC-In House): 5.26 ng/mL — ABNORMAL HIGH (ref 0.00–5.00)

## 2019-08-14 MED ORDER — DEXAMETHASONE 4 MG PO TABS
ORAL_TABLET | ORAL | Status: AC
  Filled 2019-08-14: qty 1

## 2019-08-14 MED ORDER — DEXTROSE 5 % IV SOLN
Freq: Once | INTRAVENOUS | Status: AC
  Filled 2019-08-14: qty 250

## 2019-08-14 MED ORDER — DEXAMETHASONE 4 MG PO TABS
2.0000 mg | ORAL_TABLET | Freq: Once | ORAL | Status: AC
  Administered 2019-08-14: 2 mg via ORAL

## 2019-08-14 MED ORDER — ONDANSETRON HCL 8 MG PO TABS: 8.0000 mg | ORAL_TABLET | Freq: Two times a day (BID) | ORAL | 1 refills | Status: DC | PRN

## 2019-08-14 MED ORDER — PROCHLORPERAZINE MALEATE 10 MG PO TABS: 10.0000 mg | ORAL_TABLET | Freq: Four times a day (QID) | ORAL | 1 refills | Status: AC | PRN

## 2019-08-14 MED ORDER — SODIUM CHLORIDE 0.9 % IV SOLN
150.0000 mg | Freq: Once | INTRAVENOUS | Status: AC
  Administered 2019-08-14: 150 mg via INTRAVENOUS
  Filled 2019-08-14: qty 150

## 2019-08-14 MED ORDER — PALONOSETRON HCL INJECTION 0.25 MG/5ML
0.2500 mg | Freq: Once | INTRAVENOUS | Status: AC
  Administered 2019-08-14: 0.25 mg via INTRAVENOUS

## 2019-08-14 MED ORDER — OXALIPLATIN CHEMO INJECTION 100 MG/20ML
85.0000 mg/m2 | Freq: Once | INTRAVENOUS | Status: AC
  Administered 2019-08-14: 225 mg via INTRAVENOUS
  Filled 2019-08-14: qty 40

## 2019-08-14 MED ORDER — PALONOSETRON HCL INJECTION 0.25 MG/5ML
INTRAVENOUS | Status: AC
  Filled 2019-08-14: qty 5

## 2019-08-14 MED FILL — ONDANSETRON HCL 8 MG TABLET: 8 | 15 days supply | Qty: 30 | Fill #0

## 2019-08-14 MED FILL — PROCHLORPERAZINE 10 MG TAB: 10 | 7 days supply | Qty: 30 | Fill #0

## 2019-08-14 NOTE — Progress Notes (Signed)
Spoke with Lacie - oxaliplatin dose will be reduced to 85 mg/m2 IV every 3 weeks due to tolerability concerns. Patient has myasthenia gravis. In order to avoid excess steroid use in this setting - antiemetics will be fosaprepitant 150mg  and dexamethasone 2mg  (along with the aloxi) per Lacie.   Demetrius Charity, PharmD, BCPS, [facility] Pharmacist Pharmacy Phone: [phone] 08/14/2019

## 2019-08-14 NOTE — Patient Instructions (Signed)
[facility] Discharge Instructions for Patients Receiving Chemotherapy  Today you received the following chemotherapy agents Oxaliplatin  To help prevent nausea and vomiting after your treatment, we encourage you to take your nausea medication as prescribed.   If you develop nausea and vomiting that is not controlled by your nausea medication, call the clinic.   BELOW ARE SYMPTOMS THAT SHOULD BE REPORTED IMMEDIATELY:  *FEVER GREATER THAN 100.5 F  *CHILLS WITH OR WITHOUT FEVER  NAUSEA AND VOMITING THAT IS NOT CONTROLLED WITH YOUR NAUSEA MEDICATION  *UNUSUAL SHORTNESS OF BREATH  *UNUSUAL BRUISING OR BLEEDING  TENDERNESS IN MOUTH AND THROAT WITH OR WITHOUT PRESENCE OF ULCERS  *URINARY PROBLEMS  *BOWEL PROBLEMS  UNUSUAL RASH Items with * indicate a potential emergency and should be followed up as soon as possible.  Feel free to call the clinic should you have any questions or concerns. The clinic phone number is [phone].  Please show the [facility] at check-in to the Emergency Department and triage nurse.  Oxaliplatin Injection What is this medicine? OXALIPLATIN (ox AL i PLA tin) is a chemotherapy drug. It targets fast dividing cells, like cancer cells, and causes these cells to die. This medicine is used to treat cancers of the colon and rectum, and many other cancers. This medicine may be used for other purposes; ask your health care provider or pharmacist if you have questions. COMMON BRAND NAME(S): Eloxatin What should I tell my health care provider before I take this medicine? They need to know if you have any of these conditions:  heart disease  history of irregular heartbeat  liver disease  low blood counts, like white cells, platelets, or red blood cells  lung or breathing disease, like asthma  take medicines that treat or prevent blood clots  tingling of the fingers or toes, or other nerve disorder  an unusual or allergic  reaction to oxaliplatin, other chemotherapy, other medicines, foods, dyes, or preservatives  pregnant or trying to get pregnant  breast-feeding How should I use this medicine? This drug is given as an infusion into a vein. It is administered in a hospital or clinic by a specially trained health care professional. Talk to your pediatrician regarding the use of this medicine in children. Special care may be needed. Overdosage: If you think you have taken too much of this medicine contact a poison control center or emergency room at once. NOTE: This medicine is only for you. Do not share this medicine with others. What if I miss a dose? It is important not to miss a dose. Call your doctor or health care professional if you are unable to keep an appointment. What may interact with this medicine? Do not take this medicine with any of the following medications:  cisapride  dronedarone  pimozide  thioridazine This medicine may also interact with the following medications:  aspirin and aspirin-like medicines  certain medicines that treat or prevent blood clots like warfarin, apixaban, dabigatran, and rivaroxaban  cisplatin  cyclosporine  diuretics  medicines for infection like acyclovir, adefovir, amphotericin B, bacitracin, cidofovir, foscarnet, ganciclovir, gentamicin, pentamidine, vancomycin  NSAIDs, medicines for pain and inflammation, like ibuprofen or naproxen  other medicines that prolong the QT interval (an abnormal heart rhythm)  pamidronate  zoledronic acid This list may not describe all possible interactions. Give your health care provider a list of all the medicines, herbs, non-prescription drugs, or dietary supplements you use. Also tell them if you smoke, drink alcohol, or use  illegal drugs. Some items may interact with your medicine. What should I watch for while using this medicine? Your condition will be monitored carefully while you are receiving this  medicine. You may need blood work done while you are taking this medicine. This medicine may make you feel generally unwell. This is not uncommon as chemotherapy can affect healthy cells as well as cancer cells. Report any side effects. Continue your course of treatment even though you feel ill unless your healthcare professional tells you to stop. This medicine can make you more sensitive to cold. Do not drink cold drinks or use ice. Cover exposed skin before coming in contact with cold temperatures or cold objects. When out in cold weather wear warm clothing and cover your mouth and nose to warm the air that goes into your lungs. Tell your doctor if you get sensitive to the cold. Do not become pregnant while taking this medicine or for 9 months after stopping it. Women should inform their health care professional if they wish to become pregnant or think they might be pregnant. Men should not father a child while taking this medicine and for 6 months after stopping it. There is potential for serious side effects to an unborn child. Talk to your health care professional for more information. Do not breast-feed a child while taking this medicine or for 3 months after stopping it. This medicine has caused ovarian failure in some women. This medicine may make it more difficult to get pregnant. Talk to your health care professional if you are concerned about your fertility. This medicine has caused decreased sperm counts in some men. This may make it more difficult to father a child. Talk to your health care professional if you are concerned about your fertility. This medicine may increase your risk of getting an infection. Call your health care professional for advice if you get a fever, chills, or sore throat, or other symptoms of a cold or flu. Do not treat yourself. Try to avoid being around people who are sick. Avoid taking medicines that contain aspirin, acetaminophen, ibuprofen, naproxen, or ketoprofen  unless instructed by your health care professional. These medicines may hide a fever. Be careful brushing or flossing your teeth or using a toothpick because you may get an infection or bleed more easily. If you have any dental work done, tell your dentist you are receiving this medicine. What side effects may I notice from receiving this medicine? Side effects that you should report to your doctor or health care professional as soon as possible:  allergic reactions like skin rash, itching or hives, swelling of the face, lips, or tongue  breathing problems  cough  low blood counts - this medicine may decrease the number of white blood cells, red blood cells, and platelets. You may be at increased risk for infections and bleeding  nausea, vomiting  pain, redness, or irritation at site where injected  pain, tingling, numbness in the hands or feet  signs and symptoms of bleeding such as bloody or black, tarry stools; red or dark brown urine; spitting up blood or brown material that looks like coffee grounds; red spots on the skin; unusual bruising or bleeding from the eyes, gums, or nose  signs and symptoms of a dangerous change in heartbeat or heart rhythm like chest pain; dizziness; fast, irregular heartbeat; palpitations; feeling faint or lightheaded; falls  signs and symptoms of infection like fever; chills; cough; sore throat; pain or trouble passing urine  signs and  symptoms of liver injury like dark yellow or brown urine; general ill feeling or flu-like symptoms; light-colored stools; loss of appetite; nausea; right upper belly pain; unusually weak or tired; yellowing of the eyes or skin  signs and symptoms of low red blood cells or anemia such as unusually weak or tired; feeling faint or lightheaded; falls  signs and symptoms of muscle injury like dark urine; trouble passing urine or change in the amount of urine; unusually weak or tired; muscle pain; back pain Side effects that  usually do not require medical attention (report to your doctor or health care professional if they continue or are bothersome):  changes in taste  diarrhea  gas  hair loss  loss of appetite  mouth sores This list may not describe all possible side effects. Call your doctor for medical advice about side effects. You may report side effects to FDA at 1-800-FDA-1088. Where should I keep my medicine? This drug is given in a hospital or clinic and will not be stored at home. NOTE: This sheet is a summary. It may not cover all possible information. If you have questions about this medicine, talk to your doctor, pharmacist, or health care provider.  2020 Elsevier/Gold Standard (2018-09-05 12:20:35)

## 2019-08-15 ENCOUNTER — Telehealth: Payer: Self-pay

## 2019-08-15 ENCOUNTER — Telehealth: Payer: Self-pay | Admitting: Nurse Practitioner

## 2019-08-15 NOTE — Telephone Encounter (Signed)
Scheduled appt per 4/14 los.  Spoke with pt and he is aware of his appt date and time.

## 2019-08-15 NOTE — Telephone Encounter (Signed)
I spoke with Mr Stefanovich this am.  He is feeling well after his first oxaliplatin infusion yesterday.  He had no problems during his infusion yesterday.  He voiced no questions or concerns.  I encouraged him to call if he does.  I reviewed his up coming appts.  He verbalized understanding

## 2019-08-20 NOTE — Progress Notes (Signed)
[place]   Telephone:([phone]) [phone] Fax:(336) [phone]   Clinic Follow up Note   Patient Care Team: [name], MD as PCP - General (Family Medicine) Mansouraty, [name], MD as Consulting Physician (Gastroenterology) Zonia Kief, MD (Rehabilitation) 08/20/2019  I connected with Lutricia Feil on 08/20/19 at  3:15 PM EDT by telephone visit and verified that I am speaking with the correct person using two identifiers.   I discussed the limitations, risks, security and privacy concerns of performing an evaluation and management service by telemedicine and the availability of in-person appointments. I also discussed with the patient that there may be a patient responsible charge related to this service. The patient expressed understanding and agreed to proceed.   Other persons participating in the visit and their role in the encounter: None   Patient's location: Home Provider's location: [facility] office   Chief Complaint: F/u cancer of GE junction, toxicity check   SUMMARY OF ONCOLOGIC HISTORY: Oncology History Overview Note  Cancer Staging Malignant neoplasm of gastroesophageal junction ([place]) Staging form: Esophagus - Adenocarcinoma, AJCC 8th Edition - Clinical stage from 02/26/2018: Stage IIB (cT2, cN0, cM0, G3) - Signed by [name], NP on 02/26/2018    Malignant neoplasm of gastroesophageal junction ([place])  01/22/2018 Initial Biopsy   Diagnosis Surgical [P], GE junction nodule BX - POORLY 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   01/22/2018 Procedure   COLONOSCOPY IMPRESSION - Redundant colon. - The examination was otherwise normal on direct and retroflexion views. - No specimens collected.  UPPER ENDOSCOPY IMPRESSION: - Esophageal mucosal changes secondary to established short-segment Barrett's disease. - Mucosal nodule found in the esophagus. Biopsied. - Erythematous mucosa in the  prepyloric region of the stomach. - Submucosal nodule found in the duodenum.    02/01/2018 Initial Diagnosis   Malignant neoplasm of gastroesophageal junction ([place])   02/05/2018 Imaging   CT CAP IMPRESSION: 1. No esophageal primary identified. No findings of metastatic disease in the chest, abdomen, or pelvis. 2.  Aortic Atherosclerosis (ICD10-I70.0).   02/12/2018 Procedure   EUS Impression: - A mass was found in the distal esophagus into the gastroesophageal junction and extending to proximal cardia. A tissue diagnosis was obtained prior to this exam consistent with adenocarcinoma. This was staged at T2N0Mx, because of a loss of interface of the muscularis propria as the distal GE Junction becomes the cardia and the mass moves into this region, but there are many more regions and images that suggest a T1 lesion into the submucosa.   05/29/2018 Imaging   NM PET Image Initial (PI) Skull Base To Thigh  IMPRESSION: 1. Hypermetabolic focus at the GE junction consistent with know adenocarcinoma. 2. No evidence of metastatic adenopathy in the mediastinum or upper abdomen. 3. No evidence of liver metastasis.  No pulmonary metastasis.   06/06/2018 -  Chemotherapy   Weeklycarboplatin and Taxol, with concurrent radiationstarting on 06/06/2018-07/10/18   06/06/2018 - 07/16/2018 Radiation Therapy   Concurrent chemoRT with Dr. Harvel Ricks on 06/06/2018-07/16/18   11/06/2018 PET scan   PET 11/06/18  IMPRESSION: 1. No residual hypermetabolic activity at the gastroesophageal junction. 2. No evidence of metastatic disease.   12/22/2018 Procedure   Upper Endoscopy by Dr. Loletha Carrow 12/22/18  IMPRESSION - Esophageal mucosal changes secondary to established short-segment Barrett's disease. - Erythematous, friable (with contact bleeding), nodular mucosa in the esophagus. Biopsied. May be inflammation from radiation. - Erythematous and petechial mucosa in the gastric fundus and gastric body. Appears  likely  related to radiation. - Multiple fundic gland polyps. - Eroded and nodular mucosa in the antrum. Biopsied. - Submucosal nodule found in the duodenum. Stable in size from prior exams.    12/22/2018 Pathology Results   Diagnosis 12/22/18 1. Surgical [P], random gastric BX - CHRONIC INACTIVE GASTRITIS. - THERE IS NO EVIDENCE OF HELICOBACTER PYLORI, DYSPLASIA, OR MALIGNANCY. - SEE COMMENT. 2. Surgical [P], esophagus, GE junction - GASTROESOPHAGEAL JUNCTION MUCOSA WITH MILD INFLAMMATION CONSISTENT WITH GASTROESOPHAGEAL REFLUX. - THERE IS NO EVIDENCE OF GOBLET CELL METAPLASIA, DYSPLASIA OR MALIGNANCY. - SEE COMMENT.   07/08/2019 Procedure   Upper Endoscopy by Dr. Loletha Carrow 07/08/19  IMPRESSION - Nodule found in the esophagus. Biopsied to rule out recurrent malignancy vs radiation effect. - Food (residue) in the stomach. - Retained food in the duodenum.   07/08/2019 Pathology Results   Diagnosis Surgical [P], distal esophagus - POORLY DIFFERENTIATED ADENOCARCINOMA WITH SIGNET RING FEATURES   07/25/2019 PET scan   IMPRESSION: 1. Signs of disease recurrence at the gastroesophageal junction with local nodal disease as described.   08/14/2019 -  Chemotherapy   The patient had palonosetron (ALOXI) injection 0.25 mg, 0.25 mg, Intravenous,  Once, 1 of 8 cycles Administration: 0.25 mg (08/14/2019) oxaliplatin (ELOXATIN) 225 mg in dextrose 5 % 500 mL chemo infusion, 85 mg/m2 = 225 mg (100 % of original dose 85 mg/m2), Intravenous,  Once, 1 of 8 cycles Dose modification: 85 mg/m2 (original dose 85 mg/m2, Cycle 1, Reason: Provider Judgment) Administration: 225 mg (08/14/2019) fosaprepitant (EMEND) 150 mg in sodium chloride 0.9 % 145 mL IVPB, 150 mg, Intravenous,  Once, 1 of 8 cycles Administration: 150 mg (08/14/2019)  for chemotherapy treatment.      CURRENT THERAPY: PENDINGCAPOX q3weeks with oral Xeloda 2 weeks on/1 week off, starting 08/14/19   INTERVAL HISTORY: Mr. Nazar presents by phone for  toxicity f/u. He started cycle 1 on 08/14/19 with dose reduced Oxaliplatin and continues Xeloda 2000 mg BID to total 14 days. He feels pretty good except he is tired and sleeping a lot. He has no energy. He has no difficulty with ADLs, still able to do anything he wants to do. He has some difficulty doing paperwork at night when he is more tired. Appetite is normal. His GERD worsened off nexium so he started pepcid 1 day ago. Denies dysphagia. He has mild cold sensitivity to touch. Denies mucositis. No n/v/c/d. Denies hand/foot redness or pain. He thinks his long standing neuropathy is improved on chemo. Denies fever, chills, cough, chest pain, dyspnea, leg edema, new pain or other concerns.    MEDICAL HISTORY:  Past Medical History:  Diagnosis Date  . Abdominal hernia   . Anemia   . Asthma   . Barrett's esophagus   . Cancer (HCC)    Esophagel cancer  . Cataract    Bil surgery  . Cough    over 2 weeks  . Fluid retention in legs   . GERD (gastroesophageal reflux disease)   . H/O hiatal hernia   . Hypertension   . Myasthenia gravis ([place])   . Sleep apnea    don't use c-pap at home/ tried for several years  . SOB (shortness of breath)     SURGICAL HISTORY: Past Surgical History:  Procedure Laterality Date  . BACK SURGERY     5th lumbar  . BALLOON DILATION N/A 10/19/2016   Procedure: BALLOON DILATION;  Surgeon: Ladene Artist, MD;  Location: [facility];  Service: Endoscopy;  Laterality: N/A;  .  COLONOSCOPY W/ POLYPECTOMY    . ESOPHAGOGASTRODUODENOSCOPY N/A 10/19/2016   Procedure: ESOPHAGOGASTRODUODENOSCOPY (EGD);  Surgeon: Ladene Artist, MD;  Location: [facility];  Service: Endoscopy;  Laterality: N/A;  . ESOPHAGOGASTRODUODENOSCOPY (EGD) WITH PROPOFOL N/A 02/12/2018   Procedure: ESOPHAGOGASTRODUODENOSCOPY (EGD) WITH PROPOFOL;  Surgeon: [name], MD;  Location: [facility];  Service: Gastroenterology;  Laterality: N/A;  . EUS N/A 02/12/2018   Procedure: UPPER  ENDOSCOPIC ULTRASOUND (EUS) RADIAL;  Surgeon: [name], MD;  Location: [place];  Service: Gastroenterology;  Laterality: N/A;  . EYE SURGERY Bilateral    cataract  . LUMBAR LAMINECTOMY     1980's    I have reviewed the social history and family history with the patient and they are unchanged from previous note.  ALLERGIES:  has No Known Allergies.  MEDICATIONS:  Current Outpatient Medications  Medication Sig Dispense Refill  . ALBUTEROL IN Inhale 2 puffs into the lungs every 4 (four) hours as needed (shortness of breath or wheezing).     [name] amitriptyline (ELAVIL) 25 MG tablet Take 25 mg by mouth daily.     [name] aspirin EC 81 MG tablet Take 81 mg by mouth daily.    [name] atorvastatin (LIPITOR) 10 MG tablet Take 10 mg by mouth at bedtime.     . Esomeprazole Magnesium 20 MG TBEC Take 40 mg by mouth daily at 12 noon.     . etodolac (LODINE) 200 MG capsule TAKE ONE CAPSULE BY MOUTH EVERY 8 HOURS 30 capsule 2  . etodolac (LODINE) 500 MG tablet Take 500 mg by mouth 2 (two) times daily.    . furosemide (LASIX) 20 MG tablet Take 20 mg by mouth daily.    [name] HYDROcodone-acetaminophen (NORCO) 7.5-325 MG tablet Take 1 tablet by mouth 3 (three) times daily as needed.    [name] lisinopril (ZESTRIL) 20 MG tablet Take 20 mg by mouth daily.    . Multiple Vitamin (MULTIVITAMIN) tablet Take 1 tablet by mouth daily.    . mycophenolate (CELLCEPT) 500 MG tablet Take 2 tablets (1,000 mg total) by mouth 2 (two) times daily. 120 tablet 12  . ondansetron (ZOFRAN) 8 MG tablet Take 1 tablet (8 mg total) by mouth 2 (two) times daily as needed for refractory nausea / vomiting. Start on day 3 after chemotherapy. 30 tablet 1  . prochlorperazine (COMPAZINE) 10 MG tablet Take 1 tablet (10 mg total) by mouth every 6 (six) hours as needed (Nausea or vomiting). 30 tablet 1  . pyridostigmine (MESTINON) 60 MG tablet Take 0.5-1 tablets (30-60 mg total) by mouth 3 (three) times daily. 90 tablet 12  . tamsulosin (FLOMAX)  0.4 MG CAPS capsule Take 1 capsule (0.4 mg total) by mouth daily after supper. 30 capsule 0  . XELODA 500 MG tablet Take 4 tablets (2,000 mg total) by mouth 2 (two) times daily after a meal. Take for 14 days, then hold for 7 days. Repeat every 21 days. 112 tablet 0   Current Facility-Administered Medications  Medication Dose Route Frequency Provider Last Rate Last Admin  . 0.9 %  sodium chloride infusion  500 mL Intravenous Once Nelida Meuse III, MD        PHYSICAL EXAMINATION: ECOG PERFORMANCE STATUS: 1 - Symptomatic but completely ambulatory  There were no vitals filed for this visit. There were no vitals filed for this visit.  Patient appears well over the phone. Speech is clear and intact. No cough or conversational dyspnea.   LABORATORY DATA:  No labs for  today's visit     RADIOGRAPHIC STUDIES: I have personally reviewed the radiological images as listed and agreed with the findings in the report. No results found.   ASSESSMENT & PLAN: Philip Abbey Coferis a 74 y.o.malewith    1. 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 -Due to his age and comorbidities, hewas felt to be a poorcandidate for surgery. Hewas previously seen by thoracic surgery Dr. Pia Mau. -He completed concurrent chemoRT with CT.Given he did not have surgery his risk of recurrence is more than 50%. He was on surveillance.His post-treatment PET and endoscopy in 12/2018 showed no residual disease -His Upper endoscopy by Dr. Loletha Carrow from 07/08/19 showed nodule at distal esophagus/GEJ. Hisbiopsyshows poorly differentiated adenocarcinoma, appears early stage cancer recurrence. This could be residual disease or local recurrence, he is currently asymptomatic.Dr. Burr Medico previously reviewed this. -PET from  07/25/19 showsregionalnode metastasis but nodistant metastasis. Overall only local disease.  -It was not recommended to repeat radiation given that it was just done last year. Dr. Burr Medico recommended systemic chemotherapy to control his disease, FOLFOX vs CAPOX were discussed. He opted to try CAPOX first and switch to FOLFOX if not tolerable. After at least 3 monthsif he has good response, may consider maintenance therapy with oral Xeloda alone. -The goal of therapy is palliative to prolong his life and preserve his quality of life -Histumor was HER2 (-) negative.PD-L1 was positive at 10%,unfortunately due tohis myasthenia gravis, he is not a candidate for immunotherapy.FOgenomic testing results still pending -He declined PAC for now -Cycle 1 CAPOX with dose reduced oxali 08/14/19  3.Chronic back pain  -Continue toF/uwith pain specialist, on Norco  -Dr. Burr Medico previouslysentclearance note [facility]his [facility]ur[facility]. -stable  4. HTN -on Lisinopril, f/u with PCP  5.Myasthenia gravis -Oncellcept [id number] longer term immunosuppression, and mestinon TID  -He is at high risk for infections due topriorchemo -Stablewith fatigue  6. Hypercalcemia,primary hyperparathyroidism -He takes Vitamin D and multivitamin, he has been advised to avoid additional calcium supplement  -Labshavesupportedprimary hyperparathyroidism.F/u wit hPCP about this. -Ca 10.5 on 4/14  7.Goal of care discussion  -The patient understands the goal of care is palliative. -He is full code now  Disposition:  Mr. CCarrekerappears stable. He continues cycle 1 CAPOX with Xeloda 2000 mg BID which he will complete on 08/27/19. He is tolerating treatment well except moderate fatigue. He remains functional and can do anything he wants. We reviewed the option of reducing his Xeloda dose for the remainder of this  cycle, he declined. I encouraged him to increase activity, limit naps, get sleep at night, and maintain adequate nutrition and hydration. He understands. He is otherwise tolerating cycle 1 well overall. He will be off Xeloda for 7 days, he knows to start 09/04/19 with next f/u and cycle 2 Oxali. He will call back for in-person evaluation if he has worsening symptoms or other concerns in the meantime.    All questions were answered. The patient knows to call the clinic with any problems, questions or concerns. No barriers to learning were detected. I spent 12 minutes non face to face time on today's call.      LAlla Feeling NP 08/20/19

## 2019-08-21 ENCOUNTER — Inpatient Hospital Stay (HOSPITAL_BASED_OUTPATIENT_CLINIC_OR_DEPARTMENT_OTHER): Payer: Medicare Other | Admitting: Nurse Practitioner

## 2019-08-21 ENCOUNTER — Encounter: Payer: Self-pay | Admitting: Nurse Practitioner

## 2019-08-21 DIAGNOSIS — C160 Malignant neoplasm of cardia: Secondary | ICD-10-CM

## 2019-08-26 ENCOUNTER — Other Ambulatory Visit: Payer: Self-pay | Admitting: Hematology

## 2019-08-26 DIAGNOSIS — C160 Malignant neoplasm of cardia: Secondary | ICD-10-CM

## 2019-08-28 MED FILL — XELODA 500 MG TABLET: 500 | 21 days supply | Qty: 112 | Fill #0

## 2019-09-02 NOTE — Progress Notes (Signed)
[place]   [phone] [phone]   Clinic Follow up Note   Patient Care Team: [name], MD as PCP - General (Family Medicine) Mansouraty, [name], MD as Consulting Physician (Gastroenterology) Zonia Kief, MD (Rehabilitation)  Date of Service:  09/04/2019  CHIEF COMPLAINT: F/u GEJ cancer  SUMMARY OF ONCOLOGIC HISTORY: Oncology History Overview Note  Cancer Staging Malignant neoplasm of gastroesophageal junction [facility]) Staging form: Esophagus - Adenocarcinoma, AJCC 8th Edition - Clinical stage from 02/26/2018: Stage IIB (cT2, cN0, cM0, G3) - Signed by [name], NP on 02/26/2018    Malignant neoplasm of gastroesophageal junction ([place])  01/22/2018 Initial Biopsy   Diagnosis Surgical [P], GE junction nodule BX - POORLY 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   01/22/2018 Procedure   COLONOSCOPY IMPRESSION - Redundant colon. - The examination was otherwise normal on direct and retroflexion views. - No specimens collected.  UPPER ENDOSCOPY IMPRESSION: - Esophageal mucosal changes secondary to established short-segment Barrett's disease. - Mucosal nodule found in the esophagus. Biopsied. - Erythematous mucosa in the prepyloric region of the stomach. - Submucosal nodule found in the duodenum.    02/01/2018 Initial Diagnosis   Malignant neoplasm of gastroesophageal junction ([place])   02/05/2018 Imaging   CT CAP IMPRESSION: 1. No esophageal primary identified. No findings of metastatic disease in the chest, abdomen, or pelvis. 2.  Aortic Atherosclerosis (ICD10-I70.0).   02/12/2018 Procedure   EUS Impression: - A mass was found in the distal esophagus into the gastroesophageal junction and extending to proximal cardia. A tissue diagnosis was obtained prior to this exam consistent with adenocarcinoma. This was staged at T2N0Mx, because of a loss of interface of the  muscularis propria as the distal GE Junction becomes the cardia and the mass moves into this region, but there are many more regions and images that suggest a T1 lesion into the submucosa.   05/29/2018 Imaging   NM PET Image Initial (PI) Skull Base To Thigh  IMPRESSION: 1. Hypermetabolic focus at the GE junction consistent with know adenocarcinoma. 2. No evidence of metastatic adenopathy in the mediastinum or upper abdomen. 3. No evidence of liver metastasis.  No pulmonary metastasis.   06/06/2018 -  Chemotherapy   Weeklycarboplatin and Taxol, with concurrent radiationstarting on 06/06/2018-07/10/18   06/06/2018 - 07/16/2018 Radiation Therapy   Concurrent chemoRT with Dr. Harvel Ricks on 06/06/2018-07/16/18   11/06/2018 PET scan   PET 11/06/18  IMPRESSION: 1. No residual hypermetabolic activity at the gastroesophageal junction. 2. No evidence of metastatic disease.   12/22/2018 Procedure   Upper Endoscopy by Dr. Loletha Carrow 12/22/18  IMPRESSION - Esophageal mucosal changes secondary to established short-segment Barrett's disease. - Erythematous, friable (with contact bleeding), nodular mucosa in the esophagus. Biopsied. May be inflammation from radiation. - Erythematous and petechial mucosa in the gastric fundus and gastric body. Appears likely related to radiation. - Multiple fundic gland polyps. - Eroded and nodular mucosa in the antrum. Biopsied. - Submucosal nodule found in the duodenum. Stable in size from prior exams.    12/22/2018 Pathology Results   Diagnosis 12/22/18 1. Surgical [P], random gastric BX - CHRONIC INACTIVE GASTRITIS. - THERE IS NO EVIDENCE OF HELICOBACTER PYLORI, DYSPLASIA, OR MALIGNANCY. - SEE COMMENT. 2. Surgical [P], esophagus, GE junction - GASTROESOPHAGEAL JUNCTION MUCOSA WITH MILD INFLAMMATION CONSISTENT WITH GASTROESOPHAGEAL REFLUX. - THERE IS NO EVIDENCE OF GOBLET CELL METAPLASIA, DYSPLASIA OR MALIGNANCY. - SEE COMMENT.   07/08/2019 Procedure   Upper Endoscopy  by  Dr. Loletha Carrow 07/08/19  IMPRESSION - Nodule found in the esophagus. Biopsied to rule out recurrent malignancy vs radiation effect. - Food (residue) in the stomach. - Retained food in the duodenum.   07/08/2019 Pathology Results   Diagnosis Surgical [P], distal esophagus - POORLY DIFFERENTIATED ADENOCARCINOMA WITH SIGNET RING FEATURES   07/25/2019 PET scan   IMPRESSION: 1. Signs of disease recurrence at the gastroesophageal junction with local nodal disease as described.   08/14/2019 -  Chemotherapy   CAPOX q3weeks with oral Xeloda 2073m BID 2 weeks on/1 week off      CURRENT THERAPY:  CAPOX q3weeks with oral Xeloda 2002mBID 2 weeks on/1 week off  INTERVAL HISTORY:  JoJESSEY [name] here for a follow up and treatment. He presents to the clinic alone. He notes he tolerated his first cycle CAPOX was moderately manageable with significant fatigue. He was able to still do regular routine activity but it is taxing on him. He is still doing his activities and yard tasks as needed. He denied feeling sick, only very mild cold sensitivity and able to eat more. He is very interested in something to give him more energy during the day.  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    REVIEW OF SYSTEMS:   Constitutional: Denies fevers, chills or abnormal weight loss (+) Fatigue  Eyes: Denies blurriness of vision Ears, nose, mouth, throat, and face: Denies mucositis or sore throat Respiratory: Denies cough, dyspnea or wheezes Cardiovascular: Denies palpitation, chest discomfort or lower extremity swelling Gastrointestinal:  Denies nausea, heartburn or change in bowel habits Skin: Denies abnormal skin rashes Lymphatics: Denies new lymphadenopathy or easy bruising Neurological:Denies numbness, tingling or new weaknesses Behavioral/Psych: Mood is stable, no new changes  All other systems were reviewed with the patient and are  negative.  MEDICAL HISTORY:  Past Medical History:  Diagnosis Date  . Abdominal hernia   . Anemia   . Asthma   . Barrett's esophagus   . Cancer (HCC)    Esophagel cancer  . Cataract    Bil surgery  . Cough    over 2 weeks  . Fluid retention in legs   . GERD (gastroesophageal reflux disease)   . H/O hiatal hernia   . Hypertension   . [place]  . Sleep apnea    don't use c-pap at home/ tried for several years  . SOB (shortness of breath)     SURGICAL HISTORY: Past Surgical History:  Procedure Laterality Date  . BACK SURGERY     5th lumbar  . BALLOON DILATION N/A 10/19/2016   Procedure: BALLOON DILATION;  Surgeon: StLadene ArtistMD;  [facility];  Service: Endoscopy;  Laterality: N/A;  . COLONOSCOPY W/ POLYPECTOMY    . ESOPHAGOGASTRODUODENOSCOPY N/A 10/19/2016   Procedure: ESOPHAGOGASTRODUODENOSCOPY (EGD);  Surgeon: StLadene ArtistMD;  [facility];  Service: Endoscopy;  Laterality: N/A;  . ESOPHAGOGASTRODUODENOSCOPY (EGD) WITH PROPOFOL N/A 02/12/2018   Procedure: ESOPHAGOGASTRODUODENOSCOPY (EGD) WITH PROPOFOL;  Surgeon: MaRush LandmarkaTelford Nab MD;  [facility] Service: Gastroenterology;  Laterality: N/A;  . EUS N/A 02/12/2018   Procedure: UPPER ENDOSCOPIC ULTRASOUND (EUS) RADIAL;  Surgeon: MaRush LandmarkaTelford Nab MD;  [facility] Service: Gastroenterology;  Laterality: N/A;  . EYE SURGERY Bilateral    cataract  . LUMBAR LAMINECTOMY     1980's    I have reviewed the social history and  family history with the patient and they are unchanged from previous note.  ALLERGIES:  has No Known Allergies.  MEDICATIONS:  Current Outpatient Medications  Medication Sig Dispense Refill  . ALBUTEROL IN Inhale 2 puffs into the lungs every 4 (four) hours as needed (shortness of breath or wheezing).     [name] amitriptyline (ELAVIL) 50 MG tablet Take 50 mg by mouth at bedtime.    [name] aspirin EC 81 MG tablet Take 81 mg by  mouth daily.    [name] atorvastatin (LIPITOR) 10 MG tablet Take 10 mg by mouth at bedtime.     . Esomeprazole Magnesium 20 MG TBEC Take 40 mg by mouth daily at 12 noon.     . etodolac (LODINE) 200 MG capsule TAKE ONE CAPSULE BY MOUTH EVERY 8 HOURS 30 capsule 2  . etodolac (LODINE) 500 MG tablet Take 500 mg by mouth 2 (two) times daily.    [name] HYDROcodone-acetaminophen (NORCO) 7.5-325 MG tablet Take 1 tablet by mouth 3 (three) times daily as needed.    [name] lisinopril (ZESTRIL) 20 MG tablet Take 20 mg by mouth daily.    . Multiple Vitamin (MULTIVITAMIN) tablet Take 1 tablet by mouth daily.    . mycophenolate (CELLCEPT) 500 MG tablet Take 2 tablets (1,000 mg total) by mouth 2 (two) times daily. 120 tablet 12  . ondansetron (ZOFRAN) 8 MG tablet Take 1 tablet (8 mg total) by mouth 2 (two) times daily as needed for refractory nausea / vomiting. Start on day 3 after chemotherapy. 30 tablet 1  . prochlorperazine (COMPAZINE) 10 MG tablet Take 1 tablet (10 mg total) by mouth every 6 (six) hours as needed (Nausea or vomiting). 30 tablet 1  . pyridostigmine (MESTINON) 60 MG tablet Take 0.5-1 tablets (30-60 mg total) by mouth 3 (three) times daily. 90 tablet 12  . tamsulosin (FLOMAX) 0.4 MG CAPS capsule Take 1 capsule (0.4 mg total) by mouth daily after supper. 30 capsule 0  . XELODA 500 MG tablet Take 4 tablets (2,000 mg total) by mouth 2 (two) times daily after a meal. Take for 14 days, then hold for 7 days. Repeat every 21 days. 112 tablet 1   Current Facility-Administered Medications  Medication Dose Route Frequency Provider Last Rate Last Admin  . 0.9 %  sodium chloride infusion  500 mL Intravenous Once Nelida Meuse III, MD        PHYSICAL EXAMINATION: ECOG PERFORMANCE STATUS: 2 - Symptomatic, <50% confined to bed  Vitals:   09/04/19 0958  BP: (!) 150/70  Pulse: 60  Resp: 20  Temp: 98 F (36.7 C)  SpO2: 98%   Filed Weights   09/04/19 0958  Weight: (!) 308 lb 8 oz (139.9 kg)    Due to COVID19 we  will limit examination to appearance. Patient had no complaints.  GENERAL:alert, no distress and comfortable SKIN: skin color normal, no rashes or significant lesions EYES: normal, Conjunctiva are pink and non-injected, sclera clear  NEURO: alert & oriented x 3 with fluent speech   LABORATORY DATA:  I have reviewed the data as listed CBC Latest Ref Rng & Units 09/04/2019 08/14/2019 07/25/2019  WBC 4.0 - 10.5 K/uL 5.2 6.4 7.4  Hemoglobin 13.0 - 17.0 g/dL 13.2 13.5 13.7  Hematocrit 39.0 - 52.0 % 38.2(L) 41.1 40.8  Platelets 150 - 400 K/uL 147(L) 194 187     CMP Latest Ref Rng & Units 09/04/2019 08/14/2019 07/25/2019  Glucose 70 - 99 mg/dL 137(H) 145(H) 115(H)  BUN 8 -  23 mg/dL _0 Creatinine 0.61 - 1.24 mg/dL 0.93 1.01 0.82  Sodium 135 - 145 mmol/L 142 140 138  Potassium 3.5 - 5.1 mmol/L 4.3 4.8 4.2  Chloride 98 - 111 mmol/L 109 113(H) 108  CO2 22 - 32 mmol/L 21(L) 18(L) 22  Calcium 8.9 - 10.3 mg/dL 10.6(H) 10.6(H) 10.3  Total Protein 6.5 - 8.1 g/dL 6.1(L) 6.3(L) 5.9(L)  Total Bilirubin 0.3 - 1.2 mg/dL 0.7 0.6 0.6  Alkaline Phos 38 - 126 U/L 90 81 84  AST 15 - 41 U/L _1 ALT 0 - 44 U/L 35 23 21      RADIOGRAPHIC STUDIES: I have personally reviewed the radiological images as listed and agreed with the findings in the report. No results found.   ASSESSMENT & PLAN:  Philip [name] is a 74 y.o. male with    1. 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 -Due to his age and comorbidities, hewas felt to be a poorcandidate for surgery. Hewas previously seen by thoracic surgery Dr. Pia Mau. -He completed concurrent chemoRT with CT.Given he did not have surgery his risk of recurrence is more than 50%. Willcontinue with surveillance.His post-treatment PET and endoscopy  in 12/2018 showed no residual disease.  -His Upper endoscopy by Dr. Loletha Carrow from 07/08/19 showed nodule at distal esophagus/GEJ. Hisbiopsyshows poorly differentiated adenocarcinoma, appears early stage cancer recurrence. This could be residual disease or local recurrence, he is currently asymptomatic.  -His PET from 07/25/19 showed regional node metastasis but no distant metastasis. Overall only local disease.  -His [facility] test showed PDL-1 10%+, mutation burden 10, HER-2 mutation (+), no other targeted therapy.  Due to his history of myasthenia gravis, he is not a candidate for immunotherapy.  I discussed the above with patient. -He tolerated first cycle CAPOX moderately well with moderate to significant fatigue. He otherwise tolerated treatment well. No Skin toxicity. I reviewed eating adequately and mild caffeine can help but there is no further help for fatigue to give at this point. I advised him to take a nap as needed, but remain active when he can. 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  -Labs reviewed, plt 147K, BG 137, Ca 10.6, protein 6.1. Overall adequate to proceed with C2 Oxaliplatin today, will keep same dose. Start C2 Xeloda today at same 2016m BID dose.  -Will repeat scan after Cycle 4.  -f/u in 3 weeks. He can contact clinic for significant side effects.   2.Chronic back pain  -Continue toF/uwith pain specialist -Ipreviouslysentclearance note tohis orthopedicsurgeon, [facility]. -He was previously on Tramadol, now he is on Hydrocodone which only helps his pain some.  -I recommend he not use alcohol to help pain.   3. HTN -Was onlisinopril. Due to hypotension Ipreviouslyinstructed him to hold lisinopriland add salt to diet -F/u with PCP. Has been normal lately.  4.Myasthenia gravis -Oncellcept 10029mIDfor longer term immunosuppression -He is at high  risk for infections due topriorchemo -Stablewith fatigue  5. Hypercalcemia,primary hyperparathyroidism -He takes Vitamin D. He is fine to take multivitamin that contains low dose calcium.I advised him to avoid high dose calcium. -Labshavesupportedprimary hyperparathyroidism.F/u wit hPCP about this. -Ca normal recently   6. Goal of care discussion  -We again discussed  the incurable nature of his cancer, and the overall poor prognosis, especially if he does not have good response to chemotherapy or progress on chemo -The patient understands the goal of care is palliative. -He is full code now    Plan -Labs reviewed and adequate to proceed with C2 Oxaliplatin at same dose as last cycle -Continue Xeloda today at 2088m BID for 2 weeks on/1 week off. Start C2 today (09/04/19) -Lab, f/u and Oxaliplatin in 3, 6 weeks    No problem-specific Assessment & Plan notes found for this encounter.   No orders of the defined types were placed in this encounter.  All questions were answered. The patient knows to call the clinic with any problems, questions or concerns. No barriers to learning was detected. The total time spent in the appointment was 30 minutes.     YTruitt Merle MD 09/04/2019   I, AJoslyn Devon am acting as scribe for YTruitt Merle MD.   I have reviewed the above documentation for accuracy and completeness, and I agree with the above.

## 2019-09-04 ENCOUNTER — Other Ambulatory Visit: Payer: Medicare Other

## 2019-09-04 ENCOUNTER — Inpatient Hospital Stay: Payer: Medicare Other | Attending: Nurse Practitioner

## 2019-09-04 ENCOUNTER — Ambulatory Visit: Payer: Medicare Other | Admitting: Hematology

## 2019-09-04 ENCOUNTER — Encounter: Payer: Self-pay | Admitting: Hematology

## 2019-09-04 ENCOUNTER — Inpatient Hospital Stay (HOSPITAL_BASED_OUTPATIENT_CLINIC_OR_DEPARTMENT_OTHER): Payer: Medicare Other | Admitting: Hematology

## 2019-09-04 ENCOUNTER — Ambulatory Visit: Payer: Medicare Other

## 2019-09-04 ENCOUNTER — Inpatient Hospital Stay: Payer: Medicare Other

## 2019-09-04 ENCOUNTER — Other Ambulatory Visit: Payer: Self-pay

## 2019-09-04 VITALS — BP 150/70 | HR 60 | Temp 98.0°F | Resp 20 | Ht 72.0 in | Wt 308.5 lb

## 2019-09-04 DIAGNOSIS — Z9221 Personal history of antineoplastic chemotherapy: Secondary | ICD-10-CM | POA: Insufficient documentation

## 2019-09-04 DIAGNOSIS — G7000 Myasthenia gravis without (acute) exacerbation: Secondary | ICD-10-CM | POA: Insufficient documentation

## 2019-09-04 DIAGNOSIS — I10 Essential (primary) hypertension: Secondary | ICD-10-CM | POA: Insufficient documentation

## 2019-09-04 DIAGNOSIS — M549 Dorsalgia, unspecified: Secondary | ICD-10-CM | POA: Insufficient documentation

## 2019-09-04 DIAGNOSIS — G4730 Sleep apnea, unspecified: Secondary | ICD-10-CM | POA: Insufficient documentation

## 2019-09-04 DIAGNOSIS — Z79899 Other long term (current) drug therapy: Secondary | ICD-10-CM | POA: Insufficient documentation

## 2019-09-04 DIAGNOSIS — G8929 Other chronic pain: Secondary | ICD-10-CM | POA: Insufficient documentation

## 2019-09-04 DIAGNOSIS — C160 Malignant neoplasm of cardia: Secondary | ICD-10-CM

## 2019-09-04 DIAGNOSIS — Z7982 Long term (current) use of aspirin: Secondary | ICD-10-CM | POA: Insufficient documentation

## 2019-09-04 DIAGNOSIS — E210 Primary hyperparathyroidism: Secondary | ICD-10-CM | POA: Diagnosis not present

## 2019-09-04 DIAGNOSIS — Z5111 Encounter for antineoplastic chemotherapy: Secondary | ICD-10-CM | POA: Insufficient documentation

## 2019-09-04 DIAGNOSIS — J45909 Unspecified asthma, uncomplicated: Secondary | ICD-10-CM | POA: Insufficient documentation

## 2019-09-04 DIAGNOSIS — Z923 Personal history of irradiation: Secondary | ICD-10-CM | POA: Insufficient documentation

## 2019-09-04 LAB — CMP (CANCER CENTER ONLY)
ALT: 35 U/L (ref 0–44)
AST: 28 U/L (ref 15–41)
Albumin: 3.5 g/dL (ref 3.5–5.0)
Alkaline Phosphatase: 90 U/L (ref 38–126)
Anion gap: 12 (ref 5–15)
BUN: 15 mg/dL (ref 8–23)
CO2: 21 mmol/L — ABNORMAL LOW (ref 22–32)
Calcium: 10.6 mg/dL — ABNORMAL HIGH (ref 8.9–10.3)
Chloride: 109 mmol/L (ref 98–111)
Creatinine: 0.93 mg/dL (ref 0.61–1.24)
GFR, Est AFR Am: >60 mL/min (ref >60)
GFR, Estimated: >60 mL/min (ref >60)
Glucose, Bld: 137 mg/dL — ABNORMAL HIGH (ref 70–99)
Potassium: 4.3 mmol/L (ref 3.5–5.1)
Sodium: 142 mmol/L (ref 135–145)
Total Bilirubin: 0.7 mg/dL (ref 0.3–1.2)
Total Protein: 6.1 g/dL — ABNORMAL LOW (ref 6.5–8.1)

## 2019-09-04 LAB — CBC WITH DIFFERENTIAL (CANCER CENTER ONLY)
Abs Immature Granulocytes: 0.02 10*3/uL (ref 0.00–0.07)
Basophils Absolute: 0.1 10*3/uL (ref 0.0–0.1)
Basophils Relative: 1 %
Eosinophils Absolute: 0.2 10*3/uL (ref 0.0–0.5)
Eosinophils Relative: 5 %
HCT: 38.2 % — ABNORMAL LOW (ref 39.0–52.0)
Hemoglobin: 13.2 g/dL (ref 13.0–17.0)
Immature Granulocytes: 0 %
Lymphocytes Relative: 18 %
Lymphs Abs: 0.9 10*3/uL (ref 0.7–4.0)
MCH: 32.8 pg (ref 26.0–34.0)
MCHC: 34.6 g/dL (ref 30.0–36.0)
MCV: 95 fL (ref 80.0–100.0)
Monocytes Absolute: 0.7 10*3/uL (ref 0.1–1.0)
Monocytes Relative: 13 %
Neutro Abs: 3.2 10*3/uL (ref 1.7–7.7)
Neutrophils Relative %: 63 %
Platelet Count: 147 10*3/uL — ABNORMAL LOW (ref 150–400)
RBC: 4.02 MIL/uL — ABNORMAL LOW (ref 4.22–5.81)
RDW: 16.5 % — ABNORMAL HIGH (ref 11.5–15.5)
WBC Count: 5.2 10*3/uL (ref 4.0–10.5)
nRBC: 0 % (ref 0.0–0.2)

## 2019-09-04 LAB — CEA (IN HOUSE-CHCC): CEA (CHCC-In House): 3.79 ng/mL (ref 0.00–5.00)

## 2019-09-04 MED ORDER — OXALIPLATIN CHEMO INJECTION 100 MG/20ML
85.0000 mg/m2 | Freq: Once | INTRAVENOUS | Status: AC
  Administered 2019-09-04: 225 mg via INTRAVENOUS
  Filled 2019-09-04: qty 45

## 2019-09-04 MED ORDER — DEXAMETHASONE 4 MG PO TABS
ORAL_TABLET | ORAL | Status: AC
  Filled 2019-09-04: qty 1

## 2019-09-04 MED ORDER — DEXTROSE 5 % IV SOLN
Freq: Once | INTRAVENOUS | Status: AC
  Filled 2019-09-04: qty 250

## 2019-09-04 MED ORDER — XELODA 500 MG PO TABS: 2000.0000 mg | ORAL_TABLET | Freq: Two times a day (BID) | ORAL | 1 refills | Status: DC

## 2019-09-04 MED ORDER — DEXAMETHASONE 4 MG PO TABS
2.0000 mg | ORAL_TABLET | Freq: Once | ORAL | Status: AC
  Administered 2019-09-04: 2 mg via ORAL

## 2019-09-04 MED ORDER — PALONOSETRON HCL INJECTION 0.25 MG/5ML
INTRAVENOUS | Status: AC
  Filled 2019-09-04: qty 5

## 2019-09-04 MED ORDER — PALONOSETRON HCL INJECTION 0.25 MG/5ML
0.2500 mg | Freq: Once | INTRAVENOUS | Status: AC
  Administered 2019-09-04: 0.25 mg via INTRAVENOUS

## 2019-09-04 MED ORDER — SODIUM CHLORIDE 0.9 % IV SOLN
150.0000 mg | Freq: Once | INTRAVENOUS | Status: AC
  Administered 2019-09-04: 150 mg via INTRAVENOUS
  Filled 2019-09-04: qty 150

## 2019-09-04 NOTE — Patient Instructions (Signed)
[facility] Discharge Instructions for Patients Receiving Chemotherapy  Today you received the following chemotherapy agents: Oxaliplatin  To help prevent nausea and vomiting after your treatment, we encourage you to take your nausea medication as directed.   If you develop nausea and vomiting that is not controlled by your nausea medication, call the clinic.   BELOW ARE SYMPTOMS THAT SHOULD BE REPORTED IMMEDIATELY:  *FEVER GREATER THAN 100.5 F  *CHILLS WITH OR WITHOUT FEVER  NAUSEA AND VOMITING THAT IS NOT CONTROLLED WITH YOUR NAUSEA MEDICATION  *UNUSUAL SHORTNESS OF BREATH  *UNUSUAL BRUISING OR BLEEDING  TENDERNESS IN MOUTH AND THROAT WITH OR WITHOUT PRESENCE OF ULCERS  *URINARY PROBLEMS  *BOWEL PROBLEMS  UNUSUAL RASH Items with * indicate a potential emergency and should be followed up as soon as possible.  Feel free to call the clinic should you have any questions or concerns. The clinic phone number is [phone].  Please show the CHEMO ALERT CARD at check-in to the Emergency Department and triage nurse.   

## 2019-09-09 ENCOUNTER — Other Ambulatory Visit: Payer: Medicare Other

## 2019-09-09 ENCOUNTER — Ambulatory Visit: Payer: Medicare Other | Admitting: Hematology

## 2019-09-24 NOTE — Progress Notes (Signed)
[place]   [phone] [phone]   Clinic Follow up Note   Patient Care Team: [name], MD as PCP - General (Family Medicine) Mansouraty, [name], MD as Consulting Physician (Gastroenterology) Zonia Kief, MD (Rehabilitation) 09/25/2019  CHIEF COMPLAINT: F/u GEJ carcinoma   SUMMARY OF ONCOLOGIC HISTORY: Oncology History Overview Note  Cancer Staging Malignant neoplasm of gastroesophageal junction [facility]) Staging form: Esophagus - Adenocarcinoma, AJCC 8th Edition - Clinical stage from 02/26/2018: Stage IIB (cT2, cN0, cM0, G3) - Signed by [name], NP on 02/26/2018    Malignant neoplasm of gastroesophageal junction ([place])  01/22/2018 Initial Biopsy   Diagnosis Surgical [P], GE junction nodule BX - POORLY 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   01/22/2018 Procedure   COLONOSCOPY IMPRESSION - Redundant colon. - The examination was otherwise normal on direct and retroflexion views. - No specimens collected.  UPPER ENDOSCOPY IMPRESSION: - Esophageal mucosal changes secondary to established short-segment Barrett's disease. - Mucosal nodule found in the esophagus. Biopsied. - Erythematous mucosa in the prepyloric region of the stomach. - Submucosal nodule found in the duodenum.    02/01/2018 Initial Diagnosis   Malignant neoplasm of gastroesophageal junction ([place])   02/05/2018 Imaging   CT CAP IMPRESSION: 1. No esophageal primary identified. No findings of metastatic disease in the chest, abdomen, or pelvis. 2.  Aortic Atherosclerosis (ICD10-I70.0).   02/12/2018 Procedure   EUS Impression: - A mass was found in the distal esophagus into the gastroesophageal junction and extending to proximal cardia. A tissue diagnosis was obtained prior to this exam consistent with adenocarcinoma. This was staged at T2N0Mx, because of a loss of interface of the muscularis propria as  the distal GE Junction becomes the cardia and the mass moves into this region, but there are many more regions and images that suggest a T1 lesion into the submucosa.   05/29/2018 Imaging   NM PET Image Initial (PI) Skull Base To Thigh  IMPRESSION: 1. Hypermetabolic focus at the GE junction consistent with know adenocarcinoma. 2. No evidence of metastatic adenopathy in the mediastinum or upper abdomen. 3. No evidence of liver metastasis.  No pulmonary metastasis.   06/06/2018 -  Chemotherapy   Weeklycarboplatin and Taxol, with concurrent radiationstarting on 06/06/2018-07/10/18   06/06/2018 - 07/16/2018 Radiation Therapy   Concurrent chemoRT with Dr. Harvel Ricks on 06/06/2018-07/16/18   11/06/2018 PET scan   PET 11/06/18  IMPRESSION: 1. No residual hypermetabolic activity at the gastroesophageal junction. 2. No evidence of metastatic disease.   12/22/2018 Procedure   Upper Endoscopy by Dr. Loletha Carrow 12/22/18  IMPRESSION - Esophageal mucosal changes secondary to established short-segment Barrett's disease. - Erythematous, friable (with contact bleeding), nodular mucosa in the esophagus. Biopsied. May be inflammation from radiation. - Erythematous and petechial mucosa in the gastric fundus and gastric body. Appears likely related to radiation. - Multiple fundic gland polyps. - Eroded and nodular mucosa in the antrum. Biopsied. - Submucosal nodule found in the duodenum. Stable in size from prior exams.    12/22/2018 Pathology Results   Diagnosis 12/22/18 1. Surgical [P], random gastric BX - CHRONIC INACTIVE GASTRITIS. - THERE IS NO EVIDENCE OF HELICOBACTER PYLORI, DYSPLASIA, OR MALIGNANCY. - SEE COMMENT. 2. Surgical [P], esophagus, GE junction - GASTROESOPHAGEAL JUNCTION MUCOSA WITH MILD INFLAMMATION CONSISTENT WITH GASTROESOPHAGEAL REFLUX. - THERE IS NO EVIDENCE OF GOBLET CELL METAPLASIA, DYSPLASIA OR MALIGNANCY. - SEE COMMENT.   07/08/2019 Procedure   Upper Endoscopy by Dr. Loletha Carrow 07/08/19  IMPRESSION - Nodule found in the esophagus. Biopsied to rule out recurrent malignancy vs radiation effect. - Food (residue) in the stomach. - Retained food in the duodenum.   07/08/2019 Pathology Results   Diagnosis Surgical [P], distal esophagus - POORLY DIFFERENTIATED ADENOCARCINOMA WITH SIGNET RING FEATURES   07/25/2019 PET scan   IMPRESSION: 1. Signs of disease recurrence at the gastroesophageal junction with local nodal disease as described.   08/14/2019 -  Chemotherapy   CAPOX q3weeks with oral Xeloda 2095m BID 2 weeks on/1 week off     CURRENT THERAPY: CAPOX q3weeks with oral Xeloda 20041mBID 2 weeks on/1 week off  INTERVAL HISTORY: Mr. CoMcadameturns for f/u and treatment as scheduled. He started cycle 2 CAPOX on 09/04/19 and has been off for a week. He feels well, still very tired on treatment but able to do what he wants and function but with more effort. His back is bothering him more than anything, no new or worse pain just not managed well. He takes norco 4 times daily. Sees pain specialist monthly. He might be getting an injection soon. Cold sensitivity from oxali lasts a week. He had existing neuropathy in his legs which has gotten better on chemo. Denies mucositis, fever, chills, n/v/c/d, hand-foot syndrome, cough, chest pain, dyspnea. His leg edema is stable. His GERD got worse after he stopped esomeprazole due to DDI with xeloda.   MEDICAL HISTORY:  Past Medical History:  Diagnosis Date  . Abdominal hernia   . Anemia   . Asthma   . Barrett's esophagus   . Cancer (HCC)    Esophagel cancer  . Cataract    Bil surgery  . Cough    over 2 weeks  . Fluid retention in legs   . GERD (gastroesophageal reflux disease)   . H/O hiatal hernia   . Hypertension   . [place]  . Sleep apnea    don't use c-pap at home/ tried for several years  . SOB (shortness of breath)     SURGICAL HISTORY: Past Surgical History:  Procedure Laterality Date  . BACK  SURGERY     5th lumbar  . BALLOON DILATION N/A 10/19/2016   Procedure: BALLOON DILATION;  Surgeon: StLadene ArtistMD;  [facility];  Service: Endoscopy;  Laterality: N/A;  . COLONOSCOPY W/ POLYPECTOMY    . ESOPHAGOGASTRODUODENOSCOPY N/A 10/19/2016   Procedure: ESOPHAGOGASTRODUODENOSCOPY (EGD);  Surgeon: StLadene ArtistMD;  [facility];  Service: Endoscopy;  Laterality: N/A;  . ESOPHAGOGASTRODUODENOSCOPY (EGD) WITH PROPOFOL N/A 02/12/2018   Procedure: ESOPHAGOGASTRODUODENOSCOPY (EGD) WITH PROPOFOL;  Surgeon: MaRush LandmarkaTelford Nab MD;  [facility] Service: Gastro[facility]ology;  Laterality: N/A;  . EUS N/A 02/12/2018   Procedure: UPPER ENDOSCOPIC ULTRASOUND (EUS) RADIAL;  Surgeon: MaRush LandmarkaTelford Nab MD;  [facility] Service: Gastroenterology;  Laterality: N/A;  . EYE SURGERY Bilateral    cataract  . LUMBAR LAMINECTOMY     1980's    I have reviewed the social history and family history with the patient and they are unchanged from previous note.  ALLERGIES:  has No Known Allergies.  MEDICATIONS:  Current Outpatient Medications  Medication Sig Dispense Refill  . ALBUTEROL IN Inhale 2 puffs into the lungs every 4 (four) hours as needed (shortness of breath or wheezing).     . Marland Kitchenmitriptyline (ELAVIL) 50 MG tablet Take 50 mg by mouth at bedtime.    . Marland Kitchenspirin EC 81 MG tablet Take 81 mg by mouth  daily.    . atorvastatin (LIPITOR) 10 MG tablet Take 10 mg by mouth at bedtime.     [name] etodolac (LODINE) 200 MG capsule TAKE ONE CAPSULE BY MOUTH EVERY 8 HOURS 30 capsule 2  . etodolac (LODINE) 500 MG tablet Take 500 mg by mouth 2 (two) times daily.    [name] HYDROcodone-acetaminophen (NORCO) 7.5-325 MG tablet Take 1 tablet by mouth 3 (three) times daily as needed.    [name] lisinopril (ZESTRIL) 20 MG tablet Take 20 mg by mouth daily.    . Multiple Vitamin (MULTIVITAMIN) tablet Take 1 tablet by mouth daily.    . mycophenolate (CELLCEPT) 500 MG tablet Take 2  tablets (1,000 mg total) by mouth 2 (two) times daily. 120 tablet 12  . ondansetron (ZOFRAN) 8 MG tablet Take 1 tablet (8 mg total) by mouth 2 (two) times daily as needed for refractory nausea / vomiting. Start on day 3 after chemotherapy. 30 tablet 1  . prochlorperazine (COMPAZINE) 10 MG tablet Take 1 tablet (10 mg total) by mouth every 6 (six) hours as needed (Nausea or vomiting). 30 tablet 1  . pyridostigmine (MESTINON) 60 MG tablet Take 0.5-1 tablets (30-60 mg total) by mouth 3 (three) times daily. 90 tablet 12  . tamsulosin (FLOMAX) 0.4 MG CAPS capsule Take 1 capsule (0.4 mg total) by mouth daily after supper. 30 capsule 0  . XELODA 500 MG tablet Take 4 tablets (2,000 mg total) by mouth 2 (two) times daily after a meal. Take for 14 days, then hold for 7 days. Repeat every 21 days. 112 tablet 1  . famotidine (PEPCID) 20 MG tablet Take 1 tablet (20 mg total) by mouth 2 (two) times daily. 60 tablet 0   Current Facility-Administered Medications  Medication Dose Route Frequency Provider Last Rate Last Admin  . 0.9 %  sodium chloride infusion  500 mL Intravenous Once Doran Stabler, MD       Facility-Administered Medications Ordered in Other Visits  Medication Dose Route Frequency Provider Last [place]  . dexamethasone (DECADRON) tablet 2 mg  2 mg Oral Once Truitt Merle, MD      . dextrose 5 % solution   Intravenous Once Truitt Merle, MD      . fosaprepitant (EMEND) 150 mg in sodium chloride 0.9 % 145 mL IVPB  150 mg Intravenous Once Truitt Merle, MD      . oxaliplatin (ELOXATIN) 225 mg in dextrose 5 % 500 mL chemo infusion  85 mg/m2 (Treatment Plan Recorded) Intravenous Once Truitt Merle, MD      . palonosetron (ALOXI) injection 0.25 mg  0.25 mg Intravenous Once Truitt Merle, MD        PHYSICAL EXAMINATION: ECOG PERFORMANCE STATUS: 1 - Symptomatic but completely ambulatory  Vitals:   09/25/19 1157  BP: 133/65  Pulse: (!) 53  Resp: 18  Temp: 98.4 F (36.9 C)  SpO2: 98%   Filed Weights    09/25/19 1157  Weight: (!) 304 lb 14.4 oz (138.3 kg)    GENERAL:alert, no distress and comfortable SKIN: no rash to exposed skin. Palms without erythema  EYES: sclera clear LUNGS:  normal breathing effort HEART: mild to moderate equal lower extremity edema ABDOMEN: abdomen round MSK: no focal spinal tenderness  NEURO: alert & oriented x 3 with fluent speech, normal gait   LABORATORY DATA:  I have reviewed the data as listed CBC Latest Ref Rng & Units 09/25/2019 09/04/2019 08/14/2019  WBC 4.0 - 10.5 K/uL 5.3 5.2 6.4  Hemoglobin 13.0 - 17.0 g/dL 12.6(L) 13.2 13.5  Hematocrit 39.0 - 52.0 % 37.3(L) 38.2(L) 41.1  Platelets 150 - 400 K/uL 164 147(L) 194     CMP Latest Ref Rng & Units 09/25/2019 09/04/2019 08/14/2019  Glucose 70 - 99 mg/dL 130(H) 137(H) 145(H)  BUN 8 - 23 mg/dL _0 Creatinine 0.61 - 1.24 mg/dL 0.77 0.93 1.01  Sodium 135 - 145 mmol/L 137 142 140  Potassium 3.5 - 5.1 mmol/L 4.1 4.3 4.8  Chloride 98 - 111 mmol/L 108 109 113(H)  CO2 22 - 32 mmol/L 23 21(L) 18(L)  Calcium 8.9 - 10.3 mg/dL 10.8(H) 10.6(H) 10.6(H)  Total Protein 6.5 - 8.1 g/dL 5.9(L) 6.1(L) 6.3(L)  Total Bilirubin 0.3 - 1.2 mg/dL 0.5 0.7 0.6  Alkaline Phos 38 - 126 U/L 78 90 81  AST 15 - 41 U/L _1 ALT 0 - 44 U/L 23 35 23      RADIOGRAPHIC STUDIES: I have personally reviewed the radiological images as listed and agreed with the findings in the report. No results found.   ASSESSMENT & PLAN: Philip [name] a 74 y.o.malewith    1. 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 -Due to his age and comorbidities, hewas felt to be a poorcandidate for surgery. Hewas previously seen by thoracic surgery Dr. Pia Mau. -He completed concurrent chemoRT with CT.Given he did not have surgery his  risk of recurrence is more than 50%.He was onsurveillance.His post-treatment PET and endoscopy in 12/2018 showed no residual disease -His Upper endoscopy by Dr. Loletha Carrow from 07/08/19 showed nodule at distal esophagus/GEJ. Hisbiopsyshows poorly differentiated adenocarcinoma, appears early stage cancer recurrence. This could be residual disease or local recurrence, he is currently asymptomatic.Dr. Burr Medico previously reviewed this. -PET from 07/25/19 showsregionalnode metastasis but nodistant metastasis. Overall only local disease.  -It was not recommended to repeat radiation given that it was just done last year. Dr. Burr Medico recommended systemicchemotherapy to control his disease, FOLFOX vs CAPOX were discussed.He opted to try CAPOX first and switch to FOLFOX if not tolerable. After at least 3 monthsif he has good response, mayconsidermaintenance therapy with oral Xeloda alone. -The goal of therapy is palliative to prolong his life and preserve his quality of life -Histumor wasHER2 (-)negative.PD-L1 was positive at 10%,unfortunately due tohis myasthenia gravis, he is not a candidate for immunotherapy.FOgenomic testing results still pending -He declined PAC for now -S/p Cycle 2 CAPOX with dose reduced oxali starting 08/14/19. Also minimizing steroid pre-med due to MG  3.Chronic back pain  -Continue toF/uwith pain specialist, on Norco -Dr. Lurlean Horns [facility] -He complains of back pain today, but at the usual location and intensity. Low clinical concern for disease progression to the spine   4. HTN -on Lisinopril -BP 133/65 today  5.Myasthenia gravis -Oncellcept 1075mBIDfor longer term immunosuppression, and mestinon TID -He is at high risk for infections due topriorchemo -Stablewith fatigue  6. Hypercalcemia,primary  hyperparathyroidism -He takes Vitamin Dand multivitamin, he has been advised to avoid additional calcium supplement -Labshavesupportedprimary hyperparathyroidism.F/u wit hPCP about this. -Ca >10 lately, stable   7.Goal of care discussion  -The patient understands the goal of care is palliative. -He is full code now  Disposition:  Mr. CSwaimappears stable. He completed 2 cycles of CAPOX. He  tolerates well except moderate fatigue. He remains functional and is able to recover well. No GI or skin toxicities. He has worsening GERD since he was told to stop nexium due to DDI with Xeloda. I sent in new Rx for pepcid. His back pain is flaring up, but location and intensity are at baseline, low clinical concern for cancer progression to the spine. He will f/u with pain specialist.   He is otherwise doing well. CBC and CMP are stable, adequate for treatment. CEA remains normal. He will proceed with cycle 3 CAPOX today. I will contact the pharmacy to prepare and ship Xeloda. He has enough to start tonight. He will return for f/u and cycle 4 in 3 weeks. Plan to restage after C4.   All questions were answered. The patient knows to call the clinic with any problems, questions or concerns. No barriers to learning was detected.     [name], NP 09/25/19

## 2019-09-25 ENCOUNTER — Other Ambulatory Visit: Payer: Medicare Other

## 2019-09-25 ENCOUNTER — Telehealth: Payer: Self-pay

## 2019-09-25 ENCOUNTER — Ambulatory Visit: Payer: Medicare Other

## 2019-09-25 ENCOUNTER — Inpatient Hospital Stay (HOSPITAL_BASED_OUTPATIENT_CLINIC_OR_DEPARTMENT_OTHER): Payer: Medicare Other | Admitting: Nurse Practitioner

## 2019-09-25 ENCOUNTER — Encounter: Payer: Self-pay | Admitting: Nurse Practitioner

## 2019-09-25 ENCOUNTER — Inpatient Hospital Stay: Payer: Medicare Other

## 2019-09-25 ENCOUNTER — Ambulatory Visit: Payer: Medicare Other | Admitting: Nurse Practitioner

## 2019-09-25 ENCOUNTER — Other Ambulatory Visit: Payer: Self-pay

## 2019-09-25 VITALS — BP 133/65 | HR 53 | Temp 98.4°F | Resp 18 | Ht 72.0 in | Wt 304.9 lb

## 2019-09-25 DIAGNOSIS — Z5111 Encounter for antineoplastic chemotherapy: Secondary | ICD-10-CM | POA: Diagnosis not present

## 2019-09-25 DIAGNOSIS — C160 Malignant neoplasm of cardia: Secondary | ICD-10-CM | POA: Diagnosis not present

## 2019-09-25 LAB — CBC WITH DIFFERENTIAL (CANCER CENTER ONLY)
Abs Immature Granulocytes: 0.02 10*3/uL (ref 0.00–0.07)
Basophils Absolute: 0.1 10*3/uL (ref 0.0–0.1)
Basophils Relative: 1 %
Eosinophils Absolute: 0.2 10*3/uL (ref 0.0–0.5)
Eosinophils Relative: 3 %
HCT: 37.3 % — ABNORMAL LOW (ref 39.0–52.0)
Hemoglobin: 12.6 g/dL — ABNORMAL LOW (ref 13.0–17.0)
Immature Granulocytes: 0 %
Lymphocytes Relative: 16 %
Lymphs Abs: 0.8 10*3/uL (ref 0.7–4.0)
MCH: 33.6 pg (ref 26.0–34.0)
MCHC: 33.8 g/dL (ref 30.0–36.0)
MCV: 99.5 fL (ref 80.0–100.0)
Monocytes Absolute: 0.8 10*3/uL (ref 0.1–1.0)
Monocytes Relative: 15 %
Neutro Abs: 3.4 10*3/uL (ref 1.7–7.7)
Neutrophils Relative %: 65 %
Platelet Count: 164 10*3/uL (ref 150–400)
RBC: 3.75 MIL/uL — ABNORMAL LOW (ref 4.22–5.81)
RDW: 18.6 % — ABNORMAL HIGH (ref 11.5–15.5)
WBC Count: 5.3 10*3/uL (ref 4.0–10.5)
nRBC: 0 % (ref 0.0–0.2)

## 2019-09-25 LAB — CMP (CANCER CENTER ONLY)
ALT: 23 U/L (ref 0–44)
AST: 22 U/L (ref 15–41)
Albumin: 3.5 g/dL (ref 3.5–5.0)
Alkaline Phosphatase: 78 U/L (ref 38–126)
Anion gap: 6 (ref 5–15)
BUN: 17 mg/dL (ref 8–23)
CO2: 23 mmol/L (ref 22–32)
Calcium: 10.8 mg/dL — ABNORMAL HIGH (ref 8.9–10.3)
Chloride: 108 mmol/L (ref 98–111)
Creatinine: 0.77 mg/dL (ref 0.61–1.24)
GFR, Est AFR Am: >60 mL/min (ref >60)
GFR, Estimated: >60 mL/min (ref >60)
Glucose, Bld: 130 mg/dL — ABNORMAL HIGH (ref 70–99)
Potassium: 4.1 mmol/L (ref 3.5–5.1)
Sodium: 137 mmol/L (ref 135–145)
Total Bilirubin: 0.5 mg/dL (ref 0.3–1.2)
Total Protein: 5.9 g/dL — ABNORMAL LOW (ref 6.5–8.1)

## 2019-09-25 LAB — CEA (IN HOUSE-CHCC): CEA (CHCC-In House): 3.81 ng/mL (ref 0.00–5.00)

## 2019-09-25 MED ORDER — DEXAMETHASONE 4 MG PO TABS
2.0000 mg | ORAL_TABLET | Freq: Once | ORAL | Status: AC
  Administered 2019-09-25: 2 mg via ORAL

## 2019-09-25 MED ORDER — SODIUM CHLORIDE 0.9 % IV SOLN
150.0000 mg | Freq: Once | INTRAVENOUS | Status: AC
  Administered 2019-09-25: 150 mg via INTRAVENOUS
  Filled 2019-09-25: qty 150

## 2019-09-25 MED ORDER — PALONOSETRON HCL INJECTION 0.25 MG/5ML
INTRAVENOUS | Status: AC
  Filled 2019-09-25: qty 5

## 2019-09-25 MED ORDER — OXALIPLATIN CHEMO INJECTION 100 MG/20ML
85.0000 mg/m2 | Freq: Once | INTRAVENOUS | Status: AC
  Administered 2019-09-25: 225 mg via INTRAVENOUS
  Filled 2019-09-25: qty 45

## 2019-09-25 MED ORDER — PALONOSETRON HCL INJECTION 0.25 MG/5ML
0.2500 mg | Freq: Once | INTRAVENOUS | Status: AC
  Administered 2019-09-25: 0.25 mg via INTRAVENOUS

## 2019-09-25 MED ORDER — DEXTROSE 5 % IV SOLN
Freq: Once | INTRAVENOUS | Status: AC
  Filled 2019-09-25: qty 250

## 2019-09-25 MED ORDER — DEXAMETHASONE 4 MG PO TABS
ORAL_TABLET | ORAL | Status: AC
  Filled 2019-09-25: qty 1

## 2019-09-25 MED ORDER — FAMOTIDINE 20 MG PO TABS
20.0000 mg | ORAL_TABLET | Freq: Two times a day (BID) | ORAL | 0 refills | Status: DC
Start: 2019-09-25 — End: 2019-10-28

## 2019-09-25 MED FILL — XELODA 500 MG TABLET: 500 | 21 days supply | Qty: 112 | Fill #0

## 2019-09-25 NOTE — Patient Instructions (Signed)
[facility] Discharge Instructions for Patients Receiving Chemotherapy  Today you received the following chemotherapy agents:  Oxaliplatin  To help prevent nausea and vomiting after your treatment, we encourage you to take your nausea medication as prescribed.   If you develop nausea and vomiting that is not controlled by your nausea medication, call the clinic.   BELOW ARE SYMPTOMS THAT SHOULD BE REPORTED IMMEDIATELY:  *FEVER GREATER THAN 100.5 F  *CHILLS WITH OR WITHOUT FEVER  NAUSEA AND VOMITING THAT IS NOT CONTROLLED WITH YOUR NAUSEA MEDICATION  *UNUSUAL SHORTNESS OF BREATH  *UNUSUAL BRUISING OR BLEEDING  TENDERNESS IN MOUTH AND THROAT WITH OR WITHOUT PRESENCE OF ULCERS  *URINARY PROBLEMS  *BOWEL PROBLEMS  UNUSUAL RASH Items with * indicate a potential emergency and should be followed up as soon as possible.  Feel free to call the clinic should you have any questions or concerns. The clinic phone number is [phone].  Please show the CHEMO ALERT CARD at check-in to the Emergency Department and triage nurse.   

## 2019-09-25 NOTE — Telephone Encounter (Signed)
TC per Philip Rue NP to [facility] outpatient pharmacy [phone]) to prepare and ship Xeloda. same dose. They stated that they would get this done today

## 2019-09-25 NOTE — Progress Notes (Deleted)
Per Dr. Kale, ok to treat with elevated creatinine.  

## 2019-09-26 ENCOUNTER — Telehealth: Payer: Self-pay | Admitting: Nurse Practitioner

## 2019-09-26 NOTE — Telephone Encounter (Signed)
Scheduled appt per 5/26 los.  Spoke with pt and they are aware of their scheduled appt date and time.

## 2019-10-15 MED FILL — XELODA 500 MG TABLET: 500 | 21 days supply | Qty: 112 | Fill #1

## 2019-10-15 NOTE — Progress Notes (Signed)
[place]   Telephone:([phone]) [phone] Fax:([phone]) [phone]   Clinic Follow up Note   Patient Care Team: Philip Anchors, MD as PCP - General (Family Medicine) Philip [name], Philip Nab., MD as Consulting Physician (Gastroenterology) Philip Kief, MD (Rehabilitation) 10/16/2019  CHIEF COMPLAINT: F/u GEJ carcinoma   SUMMARY OF ONCOLOGIC HISTORY: Oncology History Overview Note  Cancer Staging Malignant neoplasm of gastroesophageal junction [facility]) Staging form: Esophagus - Adenocarcinoma, AJCC 8th Edition - Clinical stage from 02/26/2018: Stage IIB (cT2, cN0, cM0, G3) - Signed by Philip Feeling, NP on 02/26/2018    Malignant neoplasm of gastroesophageal junction ([place])  01/22/2018 Initial Biopsy   Diagnosis Surgical [P], GE junction nodule BX - POORLY 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   01/22/2018 Procedure   COLONOSCOPY IMPRESSION - Redundant colon. - The examination was otherwise normal on direct and retroflexion views. - No specimens collected.  UPPER ENDOSCOPY IMPRESSION: - Esophageal mucosal changes secondary to established short-segment Barrett's disease. - Mucosal nodule found in the esophagus. Biopsied. - Erythematous mucosa in the prepyloric region of the stomach. - Submucosal nodule found in the duodenum.    02/01/2018 Initial Diagnosis   Malignant neoplasm of gastroesophageal junction ([facility])   02/05/2018 Imaging   CT CAP IMPRESSION: 1. No esophageal primary identified. No findings of metastatic disease in the chest, abdomen, or pelvis. 2.  Aortic Atherosclerosis (ICD10-I70.0).   02/12/2018 Procedure   EUS Impression: - A mass was found in the distal esophagus into the gastroesophageal junction and extending to proximal cardia. A tissue diagnosis was obtained prior to this exam consistent with adenocarcinoma. This was staged at T2N0Mx, because of a loss of interface of the muscularis propria as  the distal GE Junction becomes the cardia and the mass moves into this region, but there are many more regions and images that suggest a T1 lesion into the submucosa.   05/29/2018 Imaging   NM PET Image Initial (PI) Skull Base To Thigh  IMPRESSION: 1. Hypermetabolic focus at the GE junction consistent with know adenocarcinoma. 2. No evidence of metastatic adenopathy in the mediastinum or upper abdomen. 3. No evidence of liver metastasis.  No pulmonary metastasis.   06/06/2018 -  Chemotherapy   Weeklycarboplatin and Taxol, with concurrent radiationstarting on 06/06/2018-07/10/18   06/06/2018 - 07/16/2018 Radiation Therapy   Concurrent chemoRT with Philip [name] on 06/06/2018-07/16/18   11/06/2018 PET scan   PET 11/06/18  IMPRESSION: 1. No residual hypermetabolic activity at the gastroesophageal junction. 2. No evidence of metastatic disease.   12/22/2018 Procedure   Upper Endoscopy by Dr. Loletha [name] 12/22/18  IMPRESSION - Esophageal mucosal changes secondary to established short-segment Barrett's disease. - Erythematous, friable (with contact bleeding), nodular mucosa in the esophagus. Biopsied. May be inflammation from radiation. - Erythematous and petechial mucosa in the gastric fundus and gastric body. Appears likely related to radiation. - Multiple fundic gland polyps. - Eroded and nodular mucosa in the antrum. Biopsied. - Submucosal nodule found in the duodenum. Stable in size from prior exams.    12/22/2018 Pathology Results   Diagnosis 12/22/18 1. Surgical [P], random gastric BX - CHRONIC INACTIVE GASTRITIS. - THERE IS NO EVIDENCE OF HELICOBACTER PYLORI, DYSPLASIA, OR MALIGNANCY. - SEE COMMENT. 2. Surgical [P], esophagus, GE junction - GASTROESOPHAGEAL JUNCTION MUCOSA WITH MILD INFLAMMATION CONSISTENT WITH GASTROESOPHAGEAL REFLUX. - THERE IS NO EVIDENCE OF GOBLET CELL METAPLASIA, DYSPLASIA OR MALIGNANCY. - SEE COMMENT.   07/08/2019 Procedure   Upper Endoscopy by Dr. Loletha [name] 07/08/19  IMPRESSION - Nodule found in the esophagus. Biopsied to rule out recurrent malignancy vs radiation effect. - Food (residue) in the stomach. - Retained food in the duodenum.   07/08/2019 Pathology Results   Diagnosis Surgical [P], distal esophagus - POORLY DIFFERENTIATED ADENOCARCINOMA WITH SIGNET RING FEATURES   07/25/2019 PET scan   IMPRESSION: 1. Signs of disease recurrence at the gastroesophageal junction with local nodal disease as described.   08/14/2019 -  Chemotherapy   CAPOX q3weeks with oral Xeloda 2051m BID 2 weeks on/1 week off     CURRENT THERAPY: CAPOX q3weeks with oral Xeloda20052mBID2 weeks on/1 week off starting 08/14/19  INTERVAL HISTORY: Mr. JoHaywoodeturns for f/u and treatment as scheduled. He completed cycle 3 oxaliplatin infusion on 09/15/19 and started Xeloda as prescribed. He feels well today. Cold sensitivity lasts 2-3 days. 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 Denies hand/foot redness or sensitivity. Denies mucositis. He is eating and drinking well, appetite is normal for him. He lost weight which he says normally fluctuates. The pepcid doesn't work as well as nexium which he had to stop when he started Xeloda. Denies n/v/c/d, pain, bleeding, mucositis, or other issues. He has stiff joints and chronic back pain, denies new pain. He takes a multivitamin which might have calcium in it, not on other calcium supplements.    MEDICAL HISTORY:  Past Medical History:  Diagnosis Date  . Abdominal hernia   . Anemia   . Asthma   . Barrett's esophagus   . Cancer (HCC)    Esophagel cancer  . Cataract    Bil surgery  . Cough    over 2 weeks  . Fluid retention in legs   . GERD (gastroesophageal reflux disease)   . H/O hiatal hernia   . Hypertension   . Myasthenia gravis ([facility]  . Sleep apnea    don't use c-pap at home/ tried for several years  . SOB (shortness of breath)     SURGICAL HISTORY: Past Surgical History:  Procedure  Laterality Date  . BACK SURGERY     5th lumbar  . BALLOON DILATION N/A 10/19/2016   Procedure: BALLOON DILATION;  Surgeon: StLadene ArtistMD;  [facility];  Service: Endoscopy;  Laterality: N/A;  . COLONOSCOPY W/ POLYPECTOMY    . ESOPHAGOGASTRODUODENOSCOPY N/A 10/19/2016   Procedure: ESOPHAGOGASTRODUODENOSCOPY (EGD);  Surgeon: StLadene ArtistMD;  [facility];  Service: Endoscopy;  Laterality: N/A;  . ESOPHAGOGASTRODUODENOSCOPY (EGD) WITH PROPOFOL N/A 02/12/2018   Procedure: ESOPHAGOGASTRODUODENOSCOPY (EGD) WITH PROPOFOL;  Surgeon: MaRush LandmarkaTelford Nab MD;  [facility] Service: Gastro[facility]ology;  Laterality: N/A;  . EUS N/A 02/12/2018   Procedure: UPPER ENDOSCOPIC ULTRASOUND (EUS) RADIAL;  Surgeon: MaRush LandmarkaTelford Nab MD;  [facility] Service: Gastroenterology;  Laterality: N/A;  . EYE SURGERY Bilateral    cataract  . LUMBAR LAMINECTOMY     1980's    I have reviewed the social history and family history with the patient and they are unchanged from previous note.  ALLERGIES:  has No Known Allergies.  MEDICATIONS:  Current Outpatient Medications  Medication Sig Dispense Refill  . etodolac (LODINE) 200 MG capsule Take 200 mg by mouth every 8 (eight) hours as needed.    . ALBUTEROL IN Inhale 2 puffs into the lungs every 4 (four) hours as needed (shortness of breath or wheezing).     . Marland Kitchenmitriptyline (ELAVIL) 50 MG tablet Take 50 mg by mouth at bedtime.    .[name]  aspirin EC 81 MG tablet Take 81 mg by mouth daily.    [name] atorvastatin (LIPITOR) 10 MG tablet Take 10 mg by mouth at bedtime.     . famotidine (PEPCID) 20 MG tablet Take 1 tablet (20 mg total) by mouth 2 (two) times daily. 60 tablet 0  . HYDROcodone-acetaminophen (NORCO) 7.5-325 MG tablet Take 1 tablet by mouth 3 (three) times daily as needed.    [name] lisinopril (ZESTRIL) 20 MG tablet Take 20 mg by mouth daily.    . Multiple Vitamin (MULTIVITAMIN) tablet Take 1 tablet by mouth  daily.    . mycophenolate (CELLCEPT) 500 MG tablet Take 2 tablets (1,000 mg total) by mouth 2 (two) times daily. 120 tablet 12  . ondansetron (ZOFRAN) 8 MG tablet Take 1 tablet (8 mg total) by mouth 2 (two) times daily as needed for refractory nausea / vomiting. Start on day 3 after chemotherapy. 30 tablet 1  . prochlorperazine (COMPAZINE) 10 MG tablet Take 1 tablet (10 mg total) by mouth every 6 (six) hours as needed (Nausea or vomiting). 30 tablet 1  . pyridostigmine (MESTINON) 60 MG tablet Take 0.5-1 tablets (30-60 mg total) by mouth 3 (three) times daily. 90 tablet 12  . tamsulosin (FLOMAX) 0.4 MG CAPS capsule Take 1 capsule (0.4 mg total) by mouth daily after supper. 30 capsule 0  . XELODA 500 MG tablet Take 4 tablets (2,000 mg total) by mouth 2 (two) times daily after a meal. Take for 14 days, then hold for 7 days. Repeat every 21 days. 112 tablet 1   Current Facility-Administered Medications  Medication Dose Route Frequency Provider Last Rate Last Admin  . 0.9 %  sodium chloride infusion  500 mL Intravenous Once Doran Stabler, MD       Facility-Administered Medications Ordered in Other Visits  Medication Dose Route Frequency Provider Last [place]  . dexamethasone (DECADRON) tablet 2 mg  2 mg Oral Once Truitt Merle, MD      . dextrose 5 % solution   Intravenous Once Truitt Merle, MD      . fosaprepitant (EMEND) 150 mg in sodium chloride 0.9 % 145 mL IVPB  150 mg Intravenous Once Truitt Merle, MD      . heparin lock flush 100 unit/mL  500 Units Intracatheter Once PRN Truitt Merle, MD      . oxaliplatin (ELOXATIN) 225 mg in dextrose 5 % 500 mL chemo infusion  85 mg/m2 (Treatment Plan Recorded) Intravenous Once Truitt Merle, MD      . palonosetron (ALOXI) injection 0.25 mg  0.25 mg Intravenous Once Truitt Merle, MD      . sodium chloride flush (NS) 0.9 % injection 10 mL  10 mL Intracatheter PRN Truitt Merle, MD        PHYSICAL EXAMINATION: ECOG PERFORMANCE STATUS: 1 - Symptomatic but completely  ambulatory  Vitals:   10/16/19 1048  BP: 128/64  Pulse: 89  Resp: 19  Temp: 98.1 F (36.7 C)  SpO2: 98%   Filed Weights   10/16/19 1048  Weight: 298 lb 6.4 oz (135.4 kg)    GENERAL:alert, no distress and comfortable SKIN: no rash. Palms without erythema  EYES:  sclera clear LUNGS:  normal breathing effort HEART:  no lower extremity edema ABDOMEN:abdomen round NEURO: alert & oriented x 3 with fluent speech, normal gait   LABORATORY DATA:  I have reviewed the data as listed CBC Latest Ref Rng & Units 10/16/2019 09/25/2019 09/04/2019  WBC 4.0 - 10.5 K/uL  4.7 5.3 5.2  Hemoglobin 13.0 - 17.0 g/dL 13.5 12.6(L) 13.2  Hematocrit 39 - 52 % 39.1 37.3(L) 38.2(L)  Platelets 150 - 400 K/uL 160 164 147(L)     CMP Latest Ref Rng & Units 10/16/2019 09/25/2019 09/04/2019  Glucose 70 - 99 mg/dL 113(H) 130(H) 137(H)  BUN 8 - 23 mg/dL _0 Creatinine 0.61 - 1.24 mg/dL 0.80 0.77 0.93  Sodium 135 - 145 mmol/L 137 137 142  Potassium 3.5 - 5.1 mmol/L 4.6 4.1 4.3  Chloride 98 - 111 mmol/L 109 108 109  CO2 22 - 32 mmol/L 19(L) 23 21(L)  Calcium 8.9 - 10.3 mg/dL 11.0(H) 10.8(H) 10.6(H)  Total Protein 6.5 - 8.1 g/dL 6.1(L) 5.9(L) 6.1(L)  Total Bilirubin 0.3 - 1.2 mg/dL 0.6 0.5 0.7  Alkaline Phos 38 - 126 U/L 82 78 90  AST 15 - 41 U/L _1 ALT 0 - 44 U/L 25 23 35      RADIOGRAPHIC STUDIES: I have personally reviewed the radiological images as listed and agreed with the findings in the report. No results found.   ASSESSMENT & PLAN: Philip [name] a 74 y.o.malewith    1. 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 -Due to his age and comorbidities, hewas felt to be a poorcandidate for surgery. Hewas previously seen by thoracic surgery Dr. Pia Mau. -He completed concurrent  chemoRT with CT.Given he did not have surgery his risk of recurrence is more than 50%.He was onsurveillance.His post-treatment PET and endoscopy in 12/2018 showed no residual disease -His Upper endoscopy by Dr. Loletha [name] from 07/08/19 showed nodule at distal esophagus/GEJ. Hisbiopsyshows poorly differentiated adenocarcinoma, appears early stage cancer recurrence. This could be residual disease or local recurrence, he is currently asymptomatic.Dr. Burr Medico previously reviewed this. -PET from 07/25/19 showsregionalnode metastasis but nodistant metastasis. Overall only local disease.  -It was not recommended to repeat radiation given that it was just done last year. Dr. Burr Medico recommended systemicchemotherapy to control his disease, FOLFOX vs CAPOX were discussed.He opted to try CAPOX first and switch to FOLFOX if not tolerable. After at least 3 monthsif he has good response, mayconsidermaintenance therapy with oral Xeloda alone. -The goal of therapy is palliative to prolong his life and preserve his quality of life -Histumor wasHER2 (-)negative.PD-L1 was positive at 10%,unfortunately due tohis myasthenia gravis, he is not a candidate for immunotherapy.FOgenomic testing results still pending -He declined PAC for now -S/p Cycle 3 CAPOX with dose reduced oxali starting 08/14/19. Also minimizing steroid pre-med due to MG. Tolerating chemo well overall.   2.Chronic back pain  -Continue toF/uwith pain specialist, on Norco -Dr. Lurlean Horns [facility] -chronic back pain and joint stiffness at baseline, denies new pain   3. HTN -on Lisinopril, BP 128/64 today   4.Myasthenia gravis -Oncellcept 1010mBIDfor longer term immunosuppression, and mestinon TID -He is at high risk for infections due topriorchemo -Stablewith fatigue  5. Hypercalcemia,primary  hyperparathyroidism -He takes Vitamin Dand multivitamin, he has been advised to avoid additional calcium supplement -06/28/2018 - PTH 110, total calcium 10.8 supporting primary perparathyroidism. -Ca rising lately, now 11 today   6.Goal of care discussion  -The patient understands the goal of care is palliative. -He is full code now  Disposition:  Mr. CSniderappears stable. He completed  3 cycles of CAPOX. He tolerates treatment very well overall with mild cold sensitivity. No GI or skin toxicities. His weight is decreased 10 lbs in 6 weeks, despite a normal appetite. I encouraged him to use supplements as needed to maintain his weight. Will refer him to dietician.   CEA normalized after starting chemo, level is pending from today. CBC and CMP stable except rising calcium. He takes a multivitamin which might have Ca in it, no other calcium products. He denies new bone/joint pain. I recommend to take multivit every other day, increase liquid hydration. His previous labs support primary hyperparathyroidism, will refer back to PCP for this.   He will proceed with cycle 4 CAPOX today, no dosage adjustments. We are referring him for restaging before f/u and next cycle in 3 weeks.    Orders Placed This Encounter  Procedures  . NM PET Image Restag (PS) Skull Base To Thigh    Standing Status:   Future    Standing Expiration Date:   10/15/2020    Order Specific Question:   ** REASON FOR EXAM (FREE TEXT)    Answer:   esophagus cancer on systemic chemo, restaging    Order Specific Question:   If indicated for the ordered procedure, I authorize the administration of a radiopharmaceutical per Radiology protocol    Answer:   Yes    Order Specific Question:   Preferred imaging location?    Answer:   [facility]    Order Specific Question:   Radiology Contrast Protocol - do NOT remove file path    Answer:   _0 [email or web address]  . Ambulatory referral to Nutrition and Diabetic  E    Referral Priority:   Routine    Referral Type:   Consultation    Referral Reason:   Specialty Services Required    Number of Visits Requested:   1   All questions were answered. The patient knows to call the clinic with any problems, questions or concerns. No barriers to learning were detected.     Philip Feeling, NP 10/16/19

## 2019-10-16 ENCOUNTER — Encounter: Payer: Self-pay | Admitting: Nurse Practitioner

## 2019-10-16 ENCOUNTER — Inpatient Hospital Stay: Payer: Medicare Other

## 2019-10-16 ENCOUNTER — Inpatient Hospital Stay (HOSPITAL_BASED_OUTPATIENT_CLINIC_OR_DEPARTMENT_OTHER): Payer: Medicare Other | Admitting: Nurse Practitioner

## 2019-10-16 ENCOUNTER — Other Ambulatory Visit: Payer: Self-pay

## 2019-10-16 ENCOUNTER — Inpatient Hospital Stay: Payer: Medicare Other | Attending: Nurse Practitioner

## 2019-10-16 VITALS — BP 128/64 | HR 89 | Temp 98.1°F | Resp 19 | Ht 72.0 in | Wt 298.4 lb

## 2019-10-16 DIAGNOSIS — C160 Malignant neoplasm of cardia: Secondary | ICD-10-CM

## 2019-10-16 DIAGNOSIS — I10 Essential (primary) hypertension: Secondary | ICD-10-CM | POA: Insufficient documentation

## 2019-10-16 DIAGNOSIS — G8929 Other chronic pain: Secondary | ICD-10-CM | POA: Diagnosis not present

## 2019-10-16 DIAGNOSIS — M419 Scoliosis, unspecified: Secondary | ICD-10-CM | POA: Insufficient documentation

## 2019-10-16 DIAGNOSIS — Z79899 Other long term (current) drug therapy: Secondary | ICD-10-CM | POA: Insufficient documentation

## 2019-10-16 DIAGNOSIS — Z5111 Encounter for antineoplastic chemotherapy: Secondary | ICD-10-CM | POA: Insufficient documentation

## 2019-10-16 DIAGNOSIS — G7000 Myasthenia gravis without (acute) exacerbation: Secondary | ICD-10-CM | POA: Diagnosis not present

## 2019-10-16 DIAGNOSIS — E210 Primary hyperparathyroidism: Secondary | ICD-10-CM | POA: Diagnosis not present

## 2019-10-16 DIAGNOSIS — M549 Dorsalgia, unspecified: Secondary | ICD-10-CM | POA: Diagnosis not present

## 2019-10-16 LAB — CBC WITH DIFFERENTIAL (CANCER CENTER ONLY)
Abs Immature Granulocytes: 0.02 10*3/uL (ref 0.00–0.07)
Basophils Absolute: 0.1 10*3/uL (ref 0.0–0.1)
Basophils Relative: 1 %
Eosinophils Absolute: 0.2 10*3/uL (ref 0.0–0.5)
Eosinophils Relative: 4 %
HCT: 39.1 % (ref 39.0–52.0)
Hemoglobin: 13.5 g/dL (ref 13.0–17.0)
Immature Granulocytes: 0 %
Lymphocytes Relative: 21 %
Lymphs Abs: 1 10*3/uL (ref 0.7–4.0)
MCH: 35.1 pg — ABNORMAL HIGH (ref 26.0–34.0)
MCHC: 34.5 g/dL (ref 30.0–36.0)
MCV: 101.6 fL — ABNORMAL HIGH (ref 80.0–100.0)
Monocytes Absolute: 0.8 10*3/uL (ref 0.1–1.0)
Monocytes Relative: 18 %
Neutro Abs: 2.6 10*3/uL (ref 1.7–7.7)
Neutrophils Relative %: 56 %
Platelet Count: 160 10*3/uL (ref 150–400)
RBC: 3.85 MIL/uL — ABNORMAL LOW (ref 4.22–5.81)
RDW: 18.9 % — ABNORMAL HIGH (ref 11.5–15.5)
WBC Count: 4.7 10*3/uL (ref 4.0–10.5)
nRBC: 0 % (ref 0.0–0.2)

## 2019-10-16 LAB — CMP (CANCER CENTER ONLY)
ALT: 25 U/L (ref 0–44)
AST: 28 U/L (ref 15–41)
Albumin: 3.6 g/dL (ref 3.5–5.0)
Alkaline Phosphatase: 82 U/L (ref 38–126)
Anion gap: 9 (ref 5–15)
BUN: 14 mg/dL (ref 8–23)
CO2: 19 mmol/L — ABNORMAL LOW (ref 22–32)
Calcium: 11 mg/dL — ABNORMAL HIGH (ref 8.9–10.3)
Chloride: 109 mmol/L (ref 98–111)
Creatinine: 0.8 mg/dL (ref 0.61–1.24)
GFR, Est AFR Am: >60 mL/min (ref >60)
GFR, Estimated: >60 mL/min (ref >60)
Glucose, Bld: 113 mg/dL — ABNORMAL HIGH (ref 70–99)
Potassium: 4.6 mmol/L (ref 3.5–5.1)
Sodium: 137 mmol/L (ref 135–145)
Total Bilirubin: 0.6 mg/dL (ref 0.3–1.2)
Total Protein: 6.1 g/dL — ABNORMAL LOW (ref 6.5–8.1)

## 2019-10-16 LAB — CEA (IN HOUSE-CHCC): CEA (CHCC-In House): 3.37 ng/mL (ref 0.00–5.00)

## 2019-10-16 MED ORDER — HEPARIN SOD (PORK) LOCK FLUSH 100 UNIT/ML IV SOLN
500.0000 [IU] | Freq: Once | INTRAVENOUS | Status: DC | PRN
  Filled 2019-10-16: qty 5

## 2019-10-16 MED ORDER — DEXAMETHASONE 4 MG PO TABS
ORAL_TABLET | ORAL | Status: AC
  Filled 2019-10-16: qty 1

## 2019-10-16 MED ORDER — DEXTROSE 5 % IV SOLN
Freq: Once | INTRAVENOUS | Status: AC
  Filled 2019-10-16: qty 250

## 2019-10-16 MED ORDER — OXALIPLATIN CHEMO INJECTION 100 MG/20ML
85.0000 mg/m2 | Freq: Once | INTRAVENOUS | Status: AC
  Administered 2019-10-16: 225 mg via INTRAVENOUS
  Filled 2019-10-16: qty 40

## 2019-10-16 MED ORDER — SODIUM CHLORIDE 0.9% FLUSH
10.0000 mL | INTRAVENOUS | Status: DC | PRN
  Filled 2019-10-16: qty 10

## 2019-10-16 MED ORDER — DEXAMETHASONE 4 MG PO TABS
2.0000 mg | ORAL_TABLET | Freq: Once | ORAL | Status: AC
  Administered 2019-10-16: 2 mg via ORAL

## 2019-10-16 MED ORDER — PALONOSETRON HCL INJECTION 0.25 MG/5ML
INTRAVENOUS | Status: AC
  Filled 2019-10-16: qty 5

## 2019-10-16 MED ORDER — SODIUM CHLORIDE 0.9 % IV SOLN
150.0000 mg | Freq: Once | INTRAVENOUS | Status: AC
  Administered 2019-10-16: 150 mg via INTRAVENOUS
  Filled 2019-10-16: qty 150

## 2019-10-16 MED ORDER — PALONOSETRON HCL INJECTION 0.25 MG/5ML
0.2500 mg | Freq: Once | INTRAVENOUS | Status: AC
  Administered 2019-10-16: 0.25 mg via INTRAVENOUS

## 2019-10-16 NOTE — Patient Instructions (Signed)
[facility] Discharge Instructions for Patients Receiving Chemotherapy  Today you received the following chemotherapy agents:  Oxaliplatin  To help prevent nausea and vomiting after your treatment, we encourage you to take your nausea medication as prescribed.   If you develop nausea and vomiting that is not controlled by your nausea medication, call the clinic.   BELOW ARE SYMPTOMS THAT SHOULD BE REPORTED IMMEDIATELY:  *FEVER GREATER THAN 100.5 F  *CHILLS WITH OR WITHOUT FEVER  NAUSEA AND VOMITING THAT IS NOT CONTROLLED WITH YOUR NAUSEA MEDICATION  *UNUSUAL SHORTNESS OF BREATH  *UNUSUAL BRUISING OR BLEEDING  TENDERNESS IN MOUTH AND THROAT WITH OR WITHOUT PRESENCE OF ULCERS  *URINARY PROBLEMS  *BOWEL PROBLEMS  UNUSUAL RASH Items with * indicate a potential emergency and should be followed up as soon as possible.  Feel free to call the clinic should you have any questions or concerns. The clinic phone number is [phone].  Please show the CHEMO ALERT CARD at check-in to the Emergency Department and triage nurse.   

## 2019-10-17 ENCOUNTER — Telehealth: Payer: Self-pay | Admitting: Nurse Practitioner

## 2019-10-17 NOTE — Telephone Encounter (Signed)
Scheduled appt per 6/16 los.  Pt will get an updated appt calendar at their next scheduled appt.

## 2019-10-27 ENCOUNTER — Other Ambulatory Visit: Payer: Self-pay | Admitting: Nurse Practitioner

## 2019-10-30 NOTE — Progress Notes (Signed)
Philip [name]   [phone]) [phone] [phone]) [phone]   Clinic Follow up Note   Patient Care Team: [name], MD as PCP - General (Family Medicine) Mansouraty, [name], MD as Consulting Physician (Gastroenterology) Zonia Kief, MD (Rehabilitation)  Date of Service:  11/06/2019  CHIEF COMPLAINT: F/u GEJ cancer  SUMMARY OF ONCOLOGIC HISTORY: Oncology History Overview Note  Cancer Staging Malignant neoplasm of gastroesophageal junction [facility]) Staging form: Esophagus - Adenocarcinoma, AJCC 8th Edition - Clinical stage from 02/26/2018: Stage IIB (cT2, cN0, cM0, G3) - Signed by [name], NP on 02/26/2018    Malignant neoplasm of gastroesophageal junction ([place])  01/22/2018 Initial Biopsy   Diagnosis Surgical [P], GE junction nodule BX - POORLY 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   01/22/2018 Procedure   COLONOSCOPY IMPRESSION - Redundant colon. - The examination was otherwise normal on direct and retroflexion views. - No specimens collected.  UPPER ENDOSCOPY IMPRESSION: - Esophageal mucosal changes secondary to established short-segment Barrett's disease. - Mucosal nodule found in the esophagus. Biopsied. - Erythematous mucosa in the prepyloric region of the stomach. - Submucosal nodule found in the duodenum.    02/01/2018 Initial Diagnosis   Malignant neoplasm of gastroesophageal junction ([place])   02/05/2018 Imaging   CT CAP IMPRESSION: 1. No esophageal primary identified. No findings of metastatic disease in the chest, abdomen, or pelvis. 2.  Aortic Atherosclerosis (ICD10-I70.0).   02/12/2018 Procedure   EUS Impression: - A mass was found in the distal esophagus into the gastroesophageal junction and extending to proximal cardia. A tissue diagnosis was obtained prior to this exam consistent with adenocarcinoma. This was staged at T2N0Mx, because of a loss of interface of the  muscularis propria as the distal GE Junction becomes the cardia and the mass moves into this region, but there are many more regions and images that suggest a T1 lesion into the submucosa.   05/29/2018 Imaging   NM PET Image Initial (PI) Skull Base To Thigh  IMPRESSION: 1. Hypermetabolic focus at the GE junction consistent with know adenocarcinoma. 2. No evidence of metastatic adenopathy in the mediastinum or upper abdomen. 3. No evidence of liver metastasis.  No pulmonary metastasis.   06/06/2018 -  Chemotherapy   Weeklycarboplatin and Taxol, with concurrent radiationstarting on 06/06/2018-07/10/18   06/06/2018 - 07/16/2018 Radiation Therapy   Concurrent chemoRT with Dr. Harvel Ricks on 06/06/2018-07/16/18   11/06/2018 PET scan   PET 11/06/18  IMPRESSION: 1. No residual hypermetabolic activity at the gastroesophageal junction. 2. No evidence of metastatic disease.   12/22/2018 Procedure   Upper Endoscopy by Dr. Loletha Carrow 12/22/18  IMPRESSION - Esophageal mucosal changes secondary to established short-segment Barrett's disease. - Erythematous, friable (with contact bleeding), nodular mucosa in the esophagus. Biopsied. May be inflammation from radiation. - Erythematous and petechial mucosa in the gastric fundus and gastric body. Appears likely related to radiation. - Multiple fundic gland polyps. - Eroded and nodular mucosa in the antrum. Biopsied. - Submucosal nodule found in the duodenum. Stable in size from prior exams.    12/22/2018 Pathology Results   Diagnosis 12/22/18 1. Surgical [P], random gastric BX - CHRONIC INACTIVE GASTRITIS. - THERE IS NO EVIDENCE OF HELICOBACTER PYLORI, DYSPLASIA, OR MALIGNANCY. - SEE COMMENT. 2. Surgical [P], esophagus, GE junction - GASTROESOPHAGEAL JUNCTION MUCOSA WITH MILD INFLAMMATION CONSISTENT WITH GASTROESOPHAGEAL REFLUX. - THERE IS NO EVIDENCE OF GOBLET CELL METAPLASIA, DYSPLASIA OR MALIGNANCY. - SEE COMMENT.   07/08/2019 Procedure   Upper Endoscopy  by  Dr. Loletha Carrow 07/08/19  IMPRESSION - Nodule found in the esophagus. Biopsied to rule out recurrent malignancy vs radiation effect. - Food (residue) in the stomach. - Retained food in the duodenum.   07/08/2019 Pathology Results   Diagnosis Surgical [P], distal esophagus - POORLY DIFFERENTIATED ADENOCARCINOMA WITH SIGNET RING FEATURES   07/25/2019 PET scan   IMPRESSION: 1. Signs of disease recurrence at the gastroesophageal junction with local nodal disease as described.   08/14/2019 -  Chemotherapy   CAPOX q3weeks with oral Xeloda 2049m BID 2 weeks on/1 week off      CURRENT THERAPY:  CAPOX q3weeks with oral Xeloda 20034mBID 2 weeks on/1 week off starting on 08/14/19   INTERVAL HISTORY:  Philip Wieczorekofer is here for a follow up and treatment. He presents to the clinic alone. He notes he is doing well. He notes he it tolerating Xeloda well. He has little to no skin toxicities. He notes he is more tired now and his energy does not improve. He notes he is still able to do all activities he wants to. He will take naps as needed. 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 He notes no change in his eyelids fromMyasthenia gravis. He is on Cellcept for this.    REVIEW OF SYSTEMS:   Constitutional: Denies fevers, chills or abnormal weight loss Eyes: Denies blurriness of vision Ears, nose, mouth, throat, and face: Denies mucositis or sore throat Respiratory: Denies cough, dyspnea or wheezes Cardiovascular: Denies palpitation, chest discomfort or lower extremity swelling Gastrointestinal:  Denies nausea, heartburn or change in bowel habits Skin: Denies abnormal skin rashes Lymphatics: Denies new lymphadenopathy or easy bruising Neurological:Denies numbness, tingling or new weaknesses Behavioral/Psych: Mood is stable, no new changes  All other systems were reviewed with the patient and are negative.  MEDICAL HISTORY:  Past Medical History:  Diagnosis Date  . Abdominal hernia   .  Anemia   . Asthma   . Barrett's esophagus   . Cancer (HCC)    Esophagel cancer  . Cataract    Bil surgery  . Cough    over 2 weeks  . Fluid retention in legs   . GERD (gastroesophageal reflux disease)   . H/O hiatal hernia   . Hypertension   . [place]  . Sleep apnea    don't use c-pap at home/ tried for several years  . SOB (shortness of breath)     SURGICAL HISTORY: Past Surgical History:  Procedure Laterality Date  . BACK SURGERY     5th lumbar  . BALLOON DILATION N/A 10/19/2016   Procedure: BALLOON DILATION;  Surgeon: StLadene ArtistMD;  [facility];  Service: Endoscopy;  Laterality: N/A;  . COLONOSCOPY W/ POLYPECTOMY    . ESOPHAGOGASTRODUODENOSCOPY N/A 10/19/2016   Procedure: ESOPHAGOGASTRODUODENOSCOPY (EGD);  Surgeon: StLadene ArtistMD;  [facility];  Service: Endoscopy;  Laterality: N/A;  . ESOPHAGOGASTRODUODENOSCOPY (EGD) WITH PROPOFOL N/A 02/12/2018   Procedure: ESOPHAGOGASTRODUODENOSCOPY (EGD) WITH PROPOFOL;  Surgeon: MaRush LandmarkaTelford Nab MD;  [facility] Service: Gastroenterology;  Laterality: N/A;  . EUS N/A 02/12/2018   Procedure: UPPER ENDOSCOPIC ULTRASOUND (EUS) RADIAL;  Surgeon: MaRush LandmarkaTelford Nab MD;  [facility] Service: Gastro[facility]ology;  Laterality: N/A;  . EYE SURGERY Bilateral    cataract  . LUMBAR LAMINECTOMY     1980's    I have reviewed the social history and family history with the patient and they are unchanged from previous  note.  ALLERGIES:  has No Known Allergies.  MEDICATIONS:  Current Outpatient Medications  Medication Sig Dispense Refill  . ALBUTEROL IN Inhale 2 puffs into the lungs every 4 (four) hours as needed (shortness of breath or wheezing).     [name] amitriptyline (ELAVIL) 50 MG tablet Take 50 mg by mouth at bedtime.    [name] aspirin EC 81 MG tablet Take 81 mg by mouth daily.    [name] atorvastatin (LIPITOR) 10 MG tablet Take 10 mg by mouth at bedtime.     [name]  etodolac (LODINE) 200 MG capsule Take 200 mg by mouth every 8 (eight) hours as needed.    . famotidine (PEPCID) 20 MG tablet TAKE ONE TABLET BY MOUTH TWICE A DAY 60 tablet 0  . HYDROcodone-acetaminophen (NORCO) 7.5-325 MG tablet Take 1 tablet by mouth 3 (three) times daily as needed.    [name] lisinopril (ZESTRIL) 20 MG tablet Take 20 mg by mouth daily.    . Multiple Vitamin (MULTIVITAMIN) tablet Take 1 tablet by mouth daily.    . mycophenolate (CELLCEPT) 500 MG tablet Take 2 tablets (1,000 mg total) by mouth 2 (two) times daily. 120 tablet 12  . ondansetron (ZOFRAN) 8 MG tablet Take 1 tablet (8 mg total) by mouth 2 (two) times daily as needed for refractory nausea / vomiting. Start on day 3 after chemotherapy. 30 tablet 1  . prochlorperazine (COMPAZINE) 10 MG tablet Take 1 tablet (10 mg total) by mouth every 6 (six) hours as needed (Nausea or vomiting). 30 tablet 1  . pyridostigmine (MESTINON) 60 MG tablet Take 0.5-1 tablets (30-60 mg total) by mouth 3 (three) times daily. 90 tablet 12  . XELODA 500 MG tablet Take 4 tablets (2,000 mg total) by mouth 2 (two) times daily after a meal. Take for 14 days, then hold for 7 days. Repeat every 21 days. 112 tablet 0   Current Facility-Administered Medications  Medication Dose Route Frequency Provider Last Rate Last Admin  . 0.9 %  sodium chloride infusion  500 mL Intravenous Once Doran Stabler, MD       Facility-Administered Medications Ordered in Other Visits  Medication Dose Route Frequency Provider Last [place]  . oxaliplatin (ELOXATIN) 225 mg in dextrose 5 % 500 mL chemo infusion  85 mg/m2 (Treatment Plan Recorded) Intravenous Once Truitt Merle, MD 273 mL/hr at 11/06/19 1116 225 mg at 11/06/19 1116    PHYSICAL EXAMINATION: ECOG PERFORMANCE STATUS: 1 - Symptomatic but completely ambulatory  Vitals:   11/06/19 0901  BP: (!) 141/85  Pulse: 99  Resp: 18  Temp: 98.6 F (37 C)  SpO2: 98%   Filed Weights   11/06/19 0901  Weight: 295 lb 4.8  oz (133.9 kg)    Due to COVID19 we will limit examination to appearance. Patient had no complaints.  GENERAL:alert, no distress and comfortable SKIN: skin color normal, no rashes or significant lesions EYES: normal, Conjunctiva are pink and non-injected, sclera clear  NEURO: alert & oriented x 3 with fluent speech   LABORATORY DATA:  I have reviewed the data as listed CBC Latest Ref Rng & Units 11/06/2019 10/16/2019 09/25/2019  WBC 4.0 - 10.5 K/uL 5.5 4.7 5.3  Hemoglobin 13.0 - 17.0 g/dL 14.5 13.5 12.6(L)  Hematocrit 39 - 52 % 41.9 39.1 37.3(L)  Platelets 150 - 400 K/uL 170 160 164     CMP Latest Ref Rng & Units 11/06/2019 10/16/2019 09/25/2019  Glucose 70 - 99 mg/dL 112(H) 113(H) 130(H)  BUN 8 -  23 mg/dL _0 Creatinine 0.61 - 1.24 mg/dL 0.97 0.80 0.77  Sodium 135 - 145 mmol/L 137 137 137  Potassium 3.5 - 5.1 mmol/L 4.5 4.6 4.1  Chloride 98 - 111 mmol/L 109 109 108  CO2 22 - 32 mmol/L 18(L) 19(L) 23  Calcium 8.9 - 10.3 mg/dL 11.2(H) 11.0(H) 10.8(H)  Total Protein 6.5 - 8.1 g/dL 6.5 6.1(L) 5.9(L)  Total Bilirubin 0.3 - 1.2 mg/dL 0.9 0.6 0.5  Alkaline Phos 38 - 126 U/L 99 82 78  AST 15 - 41 U/L _1 ALT 0 - 44 U/L _2 RADIOGRAPHIC STUDIES: I have personally reviewed the radiological images as listed and agreed with the findings in the report. NM PET Image Restag (PS) Skull Base To Thigh  Result Date: 11/06/2019 CLINICAL DATA:  Subsequent treatment strategy for esophageal cancer. EXAM: NUCLEAR MEDICINE PET SKULL BASE TO THIGH TECHNIQUE: 15.1 mCi F-18 FDG was injected intravenously. Full-ring PET imaging was performed from the skull base to thigh after the radiotracer. CT data was obtained and used for attenuation correction and anatomic localization. Fasting blood glucose: 101 mg/dl COMPARISON:  PET-CT 07/25/2019 FINDINGS: Mediastinal blood pool activity: SUV max 3.29 Liver activity: SUV max NA NECK: No hypermetabolic lymph nodes in the neck. Incidental CT findings:  none CHEST: No hypermetabolic mediastinal or hilar nodes. No suspicious pulmonary nodules on the CT scan. Incidental CT findings: none ABDOMEN/PELVIS: Reduction in metabolic activity of the gastrohepatic ligament lymph node with SUV max equal 3.9 decreased from SUV max equal 6.7. Node slightly reduced in size measuring 11 mm compared to 14 mm. Likewise metabolic activity at the GE junction is decreased with SUV max equal 5.9 decreased from SUV max equal 9.0 (PET-CT 07/25/2019). No new hypermetabolic gastrohepatic lymph nodes are upper abdominal adenopathy. No abnormal activity within the liver. No evidence of metastatic adenopathy in pelvis. Incidental CT findings: Prostate gland unremarkable. Small gallstone noted. SKELETON: No focal hypermetabolic activity to suggest skeletal metastasis. Incidental CT findings: none IMPRESSION: 1. Interval decrease in metabolic activity at the gastroesophageal junction. 2. Interval decrease in size and metabolic activity of gastrohepatic ligament metastatic lymph node. 3. No evidence of new or progressive disease in the chest, abdomen, or pelvis. Electronically Signed   By: Suzy Bouchard M.D.   On: 11/06/2019 11:25     ASSESSMENT & PLAN:  Philip [name] is a 74 y.o. male with    1. 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 -Due to his age and comorbidities, hewas felt to be a poorcandidate for surgery. Hewas previously seen by thoracic surgery Dr. Pia Mau. -He completed concurrent chemoRT with CT.Given he did not have surgery his risk of recurrence is more than 50%. Willcontinue with surveillance.His post-treatment PET and endoscopy in 12/2018 showed no residual disease.  -His Upper endoscopy by Dr. Loletha Carrow from 07/08/19 showed nodule at distal esophagus/GEJ.  Hisbiopsyshows poorly differentiated adenocarcinoma, appears early stage cancer recurrence. This could be residual disease or local recurrence, he is currently asymptomatic. -His PET from 3/25/21showed regionalnode metastasis but nodistant metastasis. Overall only local disease.  -His [facility] test showed PDL-1 10%+, mutation burden 10, HER-2 mutation (+), no other targeted therapy.  Due to his history of myasthenia gravis, he is not a candidate for immunotherapy.  -I started him on CAPOX q3weeks with  oral Xeloda 2053m BID 2 weeks on/1 week off on 08/14/19. He declined PAC placement.  -His PET from 11/05/19 has not been read yet. I personally reviewed and discussed his scan with him. Scan shows decreased activity of known primary tumor and regional node. I will update him when final report is released.   -S/p C4 he is tolerating chemo well with little skin toxicity. He mainly has fatigue but able to manage. I encouraged him to remain as active as possible.  -Labs reviewed, and adequate to proceed with C5 Oxaliplatin today. Proceed with start of Xeloda at same dose 20086mBID.  -may consider lower dose chemo if his fatigue gets worse, and will stop Oxaliplatin after a total of 4-6 months if he develops neuropathy  -F/u in 3 weeks   2.Chronic back pain  -Continue toF/uwith pain specialist -Ipreviouslysentclearance note tohis orthopedicsurgeon, Dr [facility] steroid injections in spine. -He waspreviouslyon Tramadol, now he is on Hydrocodone which only helps his pain some.  -I recommend he not use alcohol to help pain.  3. HTN -F/u with PCP. Has been normal lately.  4.Myasthenia gravis -Oncellcept 100049mDfor longer term immunosuppression and Mestinon.  -He is at high risk for infections due topriorchemo.  -Stablewith fatigue. He will f/u with neurologist.   5. Hypercalcemia,primary hyperparathyroidism -He takes  Vitamin D. He is fine to take multivitamin that contains low dose calcium.I advised him to avoid high dose calcium. -Labshavesupportedprimary hyperparathyroidism.F/u withPCP about this. -Ca at 11.2 today (11/06/19).   6.Goal of care discussion  -The patient understands the goal of care is palliative. -He is full code now   Plan -PET scan reviewed, good partial response to chemo. I will update him with final report.  -Labs reviewed and adequate to proceed with C5 Oxaliplatin at same dose as last cycle -Continue Xeloda today at 2000m59mD for 2 weeks on/1 week off. Start C5 today (11/06/19) -Lab, f/u and Oxaliplatin in 3, 6 weeks    No problem-specific Assessment & Plan notes found for this encounter.   No orders of the defined types were placed in this encounter.  All questions were answered. The patient knows to call the clinic with any problems, questions or concerns. No barriers to learning was detected. The total time spent in the appointment was 30 minutes.     Hagar Sadiq Truitt Merle 11/06/2019   I, AmoyJoslyn Devon acting as scribe for Ladeidra Borys Truitt Merle.   I have reviewed the above documentation for accuracy and completeness, and I agree with the above.

## 2019-10-31 ENCOUNTER — Other Ambulatory Visit: Payer: Self-pay | Admitting: Hematology

## 2019-10-31 DIAGNOSIS — C160 Malignant neoplasm of cardia: Secondary | ICD-10-CM

## 2019-11-01 ENCOUNTER — Other Ambulatory Visit: Payer: Self-pay | Admitting: Hematology

## 2019-11-01 DIAGNOSIS — C160 Malignant neoplasm of cardia: Secondary | ICD-10-CM

## 2019-11-01 MED ORDER — XELODA 500 MG PO TABS: 2000.0000 mg | ORAL_TABLET | Freq: Two times a day (BID) | ORAL | 0 refills | Status: DC

## 2019-11-04 MED FILL — XELODA 500 MG TABLET: 500 | 21 days supply | Qty: 112 | Fill #0

## 2019-11-05 ENCOUNTER — Other Ambulatory Visit: Payer: Self-pay

## 2019-11-05 ENCOUNTER — Ambulatory Visit (HOSPITAL_COMMUNITY)
Admission: RE | Admit: 2019-11-05 | Discharge: 2019-11-05 | Disposition: A | Discharge status: Home / Self Care | Payer: Medicare Other | Source: Ambulatory Visit | Attending: Nurse Practitioner | Admitting: Nurse Practitioner

## 2019-11-05 DIAGNOSIS — C160 Malignant neoplasm of cardia: Secondary | ICD-10-CM | POA: Diagnosis not present

## 2019-11-05 LAB — GLUCOSE, CAPILLARY: Glucose-Capillary: 101 mg/dL — ABNORMAL HIGH (ref 70–99)

## 2019-11-05 MED ORDER — FLUDEOXYGLUCOSE F - 18 (FDG) INJECTION
15.1000 | Freq: Once | INTRAVENOUS | Status: AC | PRN
  Administered 2019-11-05: 15.1 via INTRAVENOUS

## 2019-11-06 ENCOUNTER — Encounter: Payer: Self-pay | Admitting: Hematology

## 2019-11-06 ENCOUNTER — Inpatient Hospital Stay: Payer: Medicare Other | Attending: Nurse Practitioner

## 2019-11-06 ENCOUNTER — Inpatient Hospital Stay: Payer: Medicare Other | Admitting: Nutrition

## 2019-11-06 ENCOUNTER — Other Ambulatory Visit: Payer: Self-pay

## 2019-11-06 ENCOUNTER — Inpatient Hospital Stay: Payer: Medicare Other

## 2019-11-06 ENCOUNTER — Inpatient Hospital Stay (HOSPITAL_BASED_OUTPATIENT_CLINIC_OR_DEPARTMENT_OTHER): Payer: Medicare Other | Admitting: Hematology

## 2019-11-06 VITALS — BP 141/85 | HR 99 | Temp 98.6°F | Resp 18 | Ht 72.0 in | Wt 295.3 lb

## 2019-11-06 DIAGNOSIS — Z5111 Encounter for antineoplastic chemotherapy: Secondary | ICD-10-CM | POA: Insufficient documentation

## 2019-11-06 DIAGNOSIS — M549 Dorsalgia, unspecified: Secondary | ICD-10-CM | POA: Diagnosis not present

## 2019-11-06 DIAGNOSIS — Z7982 Long term (current) use of aspirin: Secondary | ICD-10-CM | POA: Insufficient documentation

## 2019-11-06 DIAGNOSIS — R5383 Other fatigue: Secondary | ICD-10-CM | POA: Diagnosis not present

## 2019-11-06 DIAGNOSIS — C160 Malignant neoplasm of cardia: Secondary | ICD-10-CM | POA: Insufficient documentation

## 2019-11-06 DIAGNOSIS — Z79899 Other long term (current) drug therapy: Secondary | ICD-10-CM | POA: Diagnosis not present

## 2019-11-06 DIAGNOSIS — G8929 Other chronic pain: Secondary | ICD-10-CM | POA: Diagnosis not present

## 2019-11-06 DIAGNOSIS — G7000 Myasthenia gravis without (acute) exacerbation: Secondary | ICD-10-CM | POA: Diagnosis not present

## 2019-11-06 DIAGNOSIS — I10 Essential (primary) hypertension: Secondary | ICD-10-CM | POA: Diagnosis not present

## 2019-11-06 LAB — CMP (CANCER CENTER ONLY)
ALT: 29 U/L (ref 0–44)
AST: 29 U/L (ref 15–41)
Albumin: 3.7 g/dL (ref 3.5–5.0)
Alkaline Phosphatase: 99 U/L (ref 38–126)
Anion gap: 10 (ref 5–15)
BUN: 19 mg/dL (ref 8–23)
CO2: 18 mmol/L — ABNORMAL LOW (ref 22–32)
Calcium: 11.2 mg/dL — ABNORMAL HIGH (ref 8.9–10.3)
Chloride: 109 mmol/L (ref 98–111)
Creatinine: 0.97 mg/dL (ref 0.61–1.24)
GFR, Est AFR Am: >60 mL/min (ref >60)
GFR, Estimated: >60 mL/min (ref >60)
Glucose, Bld: 112 mg/dL — ABNORMAL HIGH (ref 70–99)
Potassium: 4.5 mmol/L (ref 3.5–5.1)
Sodium: 137 mmol/L (ref 135–145)
Total Bilirubin: 0.9 mg/dL (ref 0.3–1.2)
Total Protein: 6.5 g/dL (ref 6.5–8.1)

## 2019-11-06 LAB — CBC WITH DIFFERENTIAL (CANCER CENTER ONLY)
Abs Immature Granulocytes: 0.03 10*3/uL (ref 0.00–0.07)
Basophils Absolute: 0.1 10*3/uL (ref 0.0–0.1)
Basophils Relative: 1 %
Eosinophils Absolute: 0.2 10*3/uL (ref 0.0–0.5)
Eosinophils Relative: 3 %
HCT: 41.9 % (ref 39.0–52.0)
Hemoglobin: 14.5 g/dL (ref 13.0–17.0)
Immature Granulocytes: 1 %
Lymphocytes Relative: 17 %
Lymphs Abs: 0.9 10*3/uL (ref 0.7–4.0)
MCH: 36.8 pg — ABNORMAL HIGH (ref 26.0–34.0)
MCHC: 34.6 g/dL (ref 30.0–36.0)
MCV: 106.3 fL — ABNORMAL HIGH (ref 80.0–100.0)
Monocytes Absolute: 0.9 10*3/uL (ref 0.1–1.0)
Monocytes Relative: 17 %
Neutro Abs: 3.4 10*3/uL (ref 1.7–7.7)
Neutrophils Relative %: 61 %
Platelet Count: 170 10*3/uL (ref 150–400)
RBC: 3.94 MIL/uL — ABNORMAL LOW (ref 4.22–5.81)
RDW: 17.8 % — ABNORMAL HIGH (ref 11.5–15.5)
WBC Count: 5.5 10*3/uL (ref 4.0–10.5)
nRBC: 0 % (ref 0.0–0.2)

## 2019-11-06 LAB — CEA (IN HOUSE-CHCC): CEA (CHCC-In House): 3.77 ng/mL (ref 0.00–5.00)

## 2019-11-06 MED ORDER — DEXAMETHASONE SODIUM PHOSPHATE 10 MG/ML IJ SOLN
INTRAMUSCULAR | Status: AC
  Filled 2019-11-06: qty 1

## 2019-11-06 MED ORDER — PALONOSETRON HCL INJECTION 0.25 MG/5ML
0.2500 mg | Freq: Once | INTRAVENOUS | Status: AC
  Administered 2019-11-06: 0.25 mg via INTRAVENOUS

## 2019-11-06 MED ORDER — DEXAMETHASONE 4 MG PO TABS
2.0000 mg | ORAL_TABLET | Freq: Once | ORAL | Status: AC
  Administered 2019-11-06: 2 mg via ORAL

## 2019-11-06 MED ORDER — DEXAMETHASONE 4 MG PO TABS
ORAL_TABLET | ORAL | Status: AC
  Filled 2019-11-06: qty 1

## 2019-11-06 MED ORDER — DEXTROSE 5 % IV SOLN
Freq: Once | INTRAVENOUS | Status: AC
  Filled 2019-11-06: qty 250

## 2019-11-06 MED ORDER — SODIUM CHLORIDE 0.9 % IV SOLN
150.0000 mg | Freq: Once | INTRAVENOUS | Status: AC
  Administered 2019-11-06: 150 mg via INTRAVENOUS
  Filled 2019-11-06: qty 150

## 2019-11-06 MED ORDER — OXALIPLATIN CHEMO INJECTION 100 MG/20ML
85.0000 mg/m2 | Freq: Once | INTRAVENOUS | Status: AC
  Administered 2019-11-06: 225 mg via INTRAVENOUS
  Filled 2019-11-06: qty 40

## 2019-11-06 MED ORDER — PALONOSETRON HCL INJECTION 0.25 MG/5ML
INTRAVENOUS | Status: AC
  Filled 2019-11-06: qty 5

## 2019-11-06 NOTE — Patient Instructions (Signed)
[facility] Discharge Instructions for Patients Receiving Chemotherapy  Today you received the following chemotherapy agents: oxaliplatin.  To help prevent nausea and vomiting after your treatment, we encourage you to take your nausea medication as directed.   If you develop nausea and vomiting that is not controlled by your nausea medication, call the clinic.   BELOW ARE SYMPTOMS THAT SHOULD BE REPORTED IMMEDIATELY:  *FEVER GREATER THAN 100.5 F  *CHILLS WITH OR WITHOUT FEVER  NAUSEA AND VOMITING THAT IS NOT CONTROLLED WITH YOUR NAUSEA MEDICATION  *UNUSUAL SHORTNESS OF BREATH  *UNUSUAL BRUISING OR BLEEDING  TENDERNESS IN MOUTH AND THROAT WITH OR WITHOUT PRESENCE OF ULCERS  *URINARY PROBLEMS  *BOWEL PROBLEMS  UNUSUAL RASH Items with * indicate a potential emergency and should be followed up as soon as possible.  Feel free to call the clinic should you have any questions or concerns. The clinic phone number is [phone].  Please show the CHEMO ALERT CARD at check-in to the Emergency Department and triage nurse.   

## 2019-11-06 NOTE — Progress Notes (Signed)
74 year old male diagnosed with GE junction cancer in October 2019.  He is followed by Dr. Burr Medico. He currently is receiving Capox every 3 weeks with oral Xeloda.  Past medical history includes Barrett's esophagus, GERD, hiatal hernia, hypertension, myasthenia gravis.  Medications include Lipitor, Pepcid, multivitamin, Zofran, Compazine.  Labs include glucose 112 on July 7.  Plan: 6 feet 0 inches. Weight: 295.3 pounds July 7. Usual body weight: 308 pounds in May. BMI: 40.05.  Patient reports his weight loss has been intentional. He is trying to decrease bread and has switched from steak to seafood.  He also consumes 0-calorie beverages and uses Splenda to sweetened his iced tea. Reports he would like to lose 100 pounds. He denies nutrition impact symptoms from treatment and is swallowing without difficulty.  Nutrition diagnosis: Food and nutrition related knowledge deficit related to new diagnosis of cancer and associated treatments as evidenced by no prior need for nutritional education  Intervention: I educated patient on the importance of consuming adequate calories and protein to support maintenance of lean body mass and healing.  Discouraged patient from eliminating healthy plant-based foods from his diet.  A slow 1-2 pound weight loss during treatment is acceptable if patient is continuing to eat a healthy diet with adequate protein. Reviewed high-protein foods with patient. Questions were answered.  Teach back method used.  Patient was given contact information.  Monitoring, evaluation, goals: Patient will tolerate adequate calories and protein to minimize loss of lean body mass.  Next visit: To be scheduled as needed.  **Disclaimer: This note was dictated with voice recognition software. Similar sounding words can inadvertently be transcribed and this note may contain transcription errors which may not have been corrected upon publication of note.**

## 2019-11-07 ENCOUNTER — Telehealth: Payer: Self-pay | Admitting: Hematology

## 2019-11-07 NOTE — Telephone Encounter (Signed)
Scheduled per 7/7 los. Unable to reach pt. Left voicemail with appt times and date.

## 2019-11-07 NOTE — Telephone Encounter (Signed)
You already filled this

## 2019-11-13 ENCOUNTER — Telehealth: Payer: Self-pay

## 2019-11-13 NOTE — Telephone Encounter (Signed)
Mr Philip [name] left vm stating his "spine" doctor wants to give him a shot in the spine and wants Dr. Burr Medico clearance.

## 2019-11-14 ENCOUNTER — Telehealth: Payer: Self-pay

## 2019-11-14 NOTE — Telephone Encounter (Signed)
I called Philip [name] and informed him that per the message he left yesterday 7/14 asking Dr. Burr Medico if it was okay for him to get an spine infection from spine doctor and informed Mr. Babineau that Dr. Burr Medico said yes ok to do and Mr. Strycharz verbalize understanding.

## 2019-11-20 ENCOUNTER — Other Ambulatory Visit: Payer: Self-pay | Admitting: Hematology

## 2019-11-20 DIAGNOSIS — C160 Malignant neoplasm of cardia: Secondary | ICD-10-CM

## 2019-11-21 ENCOUNTER — Other Ambulatory Visit: Payer: Self-pay | Admitting: Hematology

## 2019-11-21 DIAGNOSIS — C160 Malignant neoplasm of cardia: Secondary | ICD-10-CM

## 2019-11-21 MED ORDER — XELODA 500 MG PO TABS: 2000.0000 mg | ORAL_TABLET | Freq: Two times a day (BID) | ORAL | 1 refills | Status: DC

## 2019-11-21 MED FILL — XELODA 500 MG TABLET: 500 | 21 days supply | Qty: 112 | Fill #0

## 2019-11-26 ENCOUNTER — Other Ambulatory Visit: Payer: Self-pay | Admitting: Nurse Practitioner

## 2019-11-26 NOTE — Progress Notes (Signed)
Philip [name]   [phone] [phone]   Clinic Follow up Note   Patient Care Team: [name], MD as PCP - General (Family Medicine) Mansouraty, [name], MD as Consulting Physician (Gastroenterology) Zonia Kief, MD (Rehabilitation) 11/27/2019  CHIEF COMPLAINT: Follow-up cancer of the GE junction  SUMMARY OF ONCOLOGIC HISTORY: Oncology History Overview Note  Cancer Staging Malignant neoplasm of gastroesophageal junction [facility]) Staging form: Esophagus - Adenocarcinoma, AJCC 8th Edition - Clinical stage from 02/26/2018: Stage IIB (cT2, cN0, cM0, G3) - Signed by [name], NP on 02/26/2018    Malignant neoplasm of gastroesophageal junction ([place])  01/22/2018 Initial Biopsy   Diagnosis Surgical [P], GE junction nodule BX - POORLY 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   01/22/2018 Procedure   COLONOSCOPY IMPRESSION - Redundant colon. - The examination was otherwise normal on direct and retroflexion views. - No specimens collected.  UPPER ENDOSCOPY IMPRESSION: - Esophageal mucosal changes secondary to established short-segment Barrett's disease. - Mucosal nodule found in the esophagus. Biopsied. - Erythematous mucosa in the prepyloric region of the stomach. - Submucosal nodule found in the duodenum.    02/01/2018 Initial Diagnosis   Malignant neoplasm of gastroesophageal junction ([place])   02/05/2018 Imaging   CT CAP IMPRESSION: 1. No esophageal primary identified. No findings of metastatic disease in the chest, abdomen, or pelvis. 2.  Aortic Atherosclerosis (ICD10-I70.0).   02/12/2018 Procedure   EUS Impression: - A mass was found in the distal esophagus into the gastroesophageal junction and extending to proximal cardia. A tissue diagnosis was obtained prior to this exam consistent with adenocarcinoma. This was staged at T2N0Mx, because of a loss of interface of the  muscularis propria as the distal GE Junction becomes the cardia and the mass moves into this region, but there are many more regions and images that suggest a T1 lesion into the submucosa.   05/29/2018 Imaging   NM PET Image Initial (PI) Skull Base To Thigh  IMPRESSION: 1. Hypermetabolic focus at the GE junction consistent with know adenocarcinoma. 2. No evidence of metastatic adenopathy in the mediastinum or upper abdomen. 3. No evidence of liver metastasis.  No pulmonary metastasis.   06/06/2018 -  Chemotherapy   Weeklycarboplatin and Taxol, with concurrent radiationstarting on 06/06/2018-07/10/18   06/06/2018 - 07/16/2018 Radiation Therapy   Concurrent chemoRT with Dr. Harvel Ricks on 06/06/2018-07/16/18   11/06/2018 PET scan   PET 11/06/18  IMPRESSION: 1. No residual hypermetabolic activity at the gastroesophageal junction. 2. No evidence of metastatic disease.   12/22/2018 Procedure   Upper Endoscopy by Dr. Loletha Carrow 12/22/18  IMPRESSION - Esophageal mucosal changes secondary to established short-segment Barrett's disease. - Erythematous, friable (with contact bleeding), nodular mucosa in the esophagus. Biopsied. May be inflammation from radiation. - Erythematous and petechial mucosa in the gastric fundus and gastric body. Appears likely related to radiation. - Multiple fundic gland polyps. - Eroded and nodular mucosa in the antrum. Biopsied. - Submucosal nodule found in the duodenum. Stable in size from prior exams.    12/22/2018 Pathology Results   Diagnosis 12/22/18 1. Surgical [P], random gastric BX - CHRONIC INACTIVE GASTRITIS. - THERE IS NO EVIDENCE OF HELICOBACTER PYLORI, DYSPLASIA, OR MALIGNANCY. - SEE COMMENT. 2. Surgical [P], esophagus, GE junction - GASTROESOPHAGEAL JUNCTION MUCOSA WITH MILD INFLAMMATION CONSISTENT WITH GASTROESOPHAGEAL REFLUX. - THERE IS NO EVIDENCE OF GOBLET CELL METAPLASIA, DYSPLASIA OR MALIGNANCY. - SEE COMMENT.   07/08/2019 Procedure   Upper Endoscopy  by Dr. Loletha Carrow  07/08/19  IMPRESSION - Nodule found in the esophagus. Biopsied to rule out recurrent malignancy vs radiation effect. - Food (residue) in the stomach. - Retained food in the duodenum.   07/08/2019 Pathology Results   Diagnosis Surgical [P], distal esophagus - POORLY DIFFERENTIATED ADENOCARCINOMA WITH SIGNET RING FEATURES   07/25/2019 PET scan   IMPRESSION: 1. Signs of disease recurrence at the gastroesophageal junction with local nodal disease as described.   08/14/2019 -  Chemotherapy   CAPOX q3weeks with oral Xeloda 2070m BID 2 weeks on/1 week off   11/05/2019 PET scan   IMPRESSION: 1. Interval decrease in metabolic activity at the gastroesophageal junction. 2. Interval decrease in size and metabolic activity of gastrohepatic ligament metastatic lymph node. 3. No evidence of new or progressive disease in the chest, abdomen, or pelvis.     CURRENT THERAPY:  CAPOX q3weeks with oral Xeloda20075mBID2 weeks on/1 week off starting on 08/14/19  INTERVAL HISTORY: Philip [name] for follow-up and treatment as scheduled.  He began cycle 5 Capox on 11/06/2019.  He has cold sensitivity and tingling in his fingertips for a week or so after infusion which mostly resolves.  Neuropathy in his feet is stable over many years.  Denies hand or foot redness or sensitivity.  He is able to perform ADLs without difficulty but has "severe fatigue" when it comes to other things such as electrical work on his house and building things.  His appetite is normal, denies mucositis.  Denies nausea, vomiting, constipation, diarrhea, fever, chills, cough, chest pain, dyspnea.  Denies dysphagia or new pain.   MEDICAL HISTORY:  Past Medical History:  Diagnosis Date  . Abdominal hernia   . Anemia   . Asthma   . Barrett's esophagus   . Cancer (HCC)    Esophagel cancer  . Cataract    Bil surgery  . Cough    over 2 weeks  . Fluid retention in legs   . GERD (gastroesophageal reflux disease)   .  H/O hiatal hernia   . Hypertension   . [place]  . Sleep apnea    don't use c-pap at home/ tried for several years  . SOB (shortness of breath)     SURGICAL HISTORY: Past Surgical History:  Procedure Laterality Date  . BACK SURGERY     5th lumbar  . BALLOON DILATION N/A 10/19/2016   Procedure: BALLOON DILATION;  Surgeon: StLadene ArtistMD;  [facility];  Service: Endoscopy;  Laterality: N/A;  . COLONOSCOPY W/ POLYPECTOMY    . ESOPHAGOGASTRODUODENOSCOPY N/A 10/19/2016   Procedure: ESOPHAGOGASTRODUODENOSCOPY (EGD);  Surgeon: StLadene ArtistMD;  [facility];  Service: Endoscopy;  Laterality: N/A;  . ESOPHAGOGASTRODUODENOSCOPY (EGD) WITH PROPOFOL N/A 02/12/2018   Procedure: ESOPHAGOGASTRODUODENOSCOPY (EGD) WITH PROPOFOL;  Surgeon: MaRush LandmarkaTelford Nab MD;  [facility] Service: Gastroenterology;  Laterality: N/A;  . EUS N/A 02/12/2018   Procedure: UPPER ENDOSCOPIC ULTRASOUND (EUS) RADIAL;  Surgeon: MaRush LandmarkaTelford Nab MD;  [facility] Service: Gastro[facility]ology;  Laterality: N/A;  . EYE SURGERY Bilateral    cataract  . LUMBAR LAMINECTOMY     1980's    I have reviewed the social history and family history with the patient and they are unchanged from previous note.  ALLERGIES:  has No Known Allergies.  MEDICATIONS:  Current Outpatient Medications  Medication Sig Dispense Refill  . ALBUTEROL IN Inhale 2 puffs into the lungs every 4 (four) hours as needed (shortness of breath or  wheezing).     [name] amitriptyline (ELAVIL) 50 MG tablet Take 50 mg by mouth at bedtime.    [name] aspirin EC 81 MG tablet Take 81 mg by mouth daily.    [name] atorvastatin (LIPITOR) 10 MG tablet Take 10 mg by mouth at bedtime.     [name] etodolac (LODINE) 200 MG capsule Take 200 mg by mouth every 8 (eight) hours as needed.    . famotidine (PEPCID) 20 MG tablet Take 1 tablet (20 mg total) by mouth 2 (two) times daily. 60 tablet 1  .  HYDROcodone-acetaminophen (NORCO) 7.5-325 MG tablet Take 1 tablet by mouth 3 (three) times daily as needed.    [name] lisinopril (ZESTRIL) 20 MG tablet Take 20 mg by mouth daily.    . Multiple Vitamin (MULTIVITAMIN) tablet Take 1 tablet by mouth daily.    . mycophenolate (CELLCEPT) 500 MG tablet Take 2 tablets (1,000 mg total) by mouth 2 (two) times daily. 120 tablet 12  . ondansetron (ZOFRAN) 8 MG tablet Take 1 tablet (8 mg total) by mouth 2 (two) times daily as needed for refractory nausea / vomiting. Start on day 3 after chemotherapy. 30 tablet 1  . prochlorperazine (COMPAZINE) 10 MG tablet Take 1 tablet (10 mg total) by mouth every 6 (six) hours as needed (Nausea or vomiting). 30 tablet 1  . pyridostigmine (MESTINON) 60 MG tablet Take 0.5-1 tablets (30-60 mg total) by mouth 3 (three) times daily. 90 tablet 12  . XELODA 500 MG tablet Take 4 tablets (2,000 mg total) by mouth 2 (two) times daily after a meal. Take for 14 days, then hold for 7 days. Repeat every 21 days. 112 tablet 1   Current Facility-Administered Medications  Medication Dose Route Frequency Provider Last Rate Last Admin  . 0.9 %  sodium chloride infusion  500 mL Intravenous Once Doran Stabler, MD       Facility-Administered Medications Ordered in Other Visits  Medication Dose Route Frequency Provider Last [place]  . dexamethasone (DECADRON) tablet 2 mg  2 mg Oral Once Truitt Merle, MD      . dextrose 5 % solution   Intravenous Once Truitt Merle, MD      . fosaprepitant (EMEND) 150 mg in sodium chloride 0.9 % 145 mL IVPB  150 mg Intravenous Once Truitt Merle, MD      . heparin lock flush 100 unit/mL  500 Units Intracatheter Once PRN Truitt Merle, MD      . oxaliplatin (ELOXATIN) 225 mg in dextrose 5 % 500 mL chemo infusion  85 mg/m2 (Treatment Plan Recorded) Intravenous Once Truitt Merle, MD      . palonosetron (ALOXI) injection 0.25 mg  0.25 mg Intravenous Once Truitt Merle, MD      . sodium chloride flush (NS) 0.9 % injection 10 mL  10  mL Intracatheter PRN Truitt Merle, MD        PHYSICAL EXAMINATION: ECOG PERFORMANCE STATUS: 1 - Symptomatic but completely ambulatory  Vitals:   11/27/19 0904  BP: (!) 138/70  Pulse: 81  Resp: 18  Temp: 97.9 F (36.6 C)  SpO2: 99%   Filed Weights   11/27/19 0904  Weight: (!) 295 lb 3.2 oz (133.9 kg)    GENERAL:alert, no distress and comfortable SKIN: No rash to exposed skin.  Palms without erythema  EYES:  sclera clear LUNGS:  normal breathing effort HEART:  no lower extremity edema NEURO: alert & oriented x 3 with fluent speech, intact peripheral vibratory sense to  fingertips per tuning fork exam  LABORATORY DATA:  I have reviewed the data as listed CBC Latest Ref Rng & Units 11/27/2019 11/06/2019 10/16/2019  WBC 4.0 - 10.5 K/uL 6.2 5.5 4.7  Hemoglobin 13.0 - 17.0 g/dL 13.0 14.5 13.5  Hematocrit 39 - 52 % 37.2(L) 41.9 39.1  Platelets 150 - 400 K/uL 140(L) 170 160     CMP Latest Ref Rng & Units 11/27/2019 11/06/2019 10/16/2019  Glucose 70 - 99 mg/dL 114(H) 112(H) 113(H)  BUN 8 - 23 mg/dL _0 Creatinine 0.61 - 1.24 mg/dL 0.75 0.97 0.80  Sodium 135 - 145 mmol/L 137 137 137  Potassium 3.5 - 5.1 mmol/L 4.4 4.5 4.6  Chloride 98 - 111 mmol/L 110 109 109  CO2 22 - 32 mmol/L 20(L) 18(L) 19(L)  Calcium 8.9 - 10.3 mg/dL 11.3(H) 11.2(H) 11.0(H)  Total Protein 6.5 - 8.1 g/dL 6.1(L) 6.5 6.1(L)  Total Bilirubin 0.3 - 1.2 mg/dL 0.7 0.9 0.6  Alkaline Phos 38 - 126 U/L 88 99 82  AST 15 - 41 U/L 32 29 28  ALT 0 - 44 U/L _1 RADIOGRAPHIC STUDIES: I have personally reviewed the radiological images as listed and agreed with the findings in the report. No results found.   ASSESSMENT & PLAN: Philip Mcadams Coferis a 74 y.o.malewith    1. 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 -Due to his age and comorbidities, hewas felt to be a poorcandidate for surgery. Hewas previously seen by thoracic surgery Dr. Pia Mau. -He completed concurrent chemoRT with CT.Given he did not have surgery his risk of recurrence is more than 50%.He was onsurveillance.His post-treatment PET and endoscopy in 12/2018 showed no residual disease -His Upper endoscopy by Dr. Loletha Carrow from 07/08/19 showed nodule at distal esophagus/GEJ. Hisbiopsyshows poorly differentiated adenocarcinoma, appears early stage cancer recurrence. This could be residual disease or local recurrence, he is currently asymptomatic.Dr. Burr Medico previously reviewed this. -PET from 07/25/19 showsregionalnode metastasis but nodistant metastasis. Overall only local disease.  -It was not recommended to repeat radiation given that it was just done last year. Dr. Burr Medico recommended systemicchemotherapy to control his disease, FOLFOX vs CAPOX were discussed.He opted to try CAPOX first and switch to FOLFOX if not tolerable.  -The goal of therapy is palliative to prolong his life and preserve his quality of life -FO showed PDL-1 10%+,mutation burden 10, HER-2 mutation(+), no other targeted therapy. Due to his history ofmyasthenia gravis,he is not a candidate for immunotherapy.  -He declined PAC for now -S/pCycle5CAPOX with dose reduced oxali starting4/14/21. Also minimizing steroid pre-med due to MG. Tolerating chemo well overall.  -PET scan on 11/06/2019 shows interval reduction in metabolic activity and size of the GE junction tumor and adenopathy, no evidence of disease progression.  2.Chronic back pain  -Continue toF/uwith pain specialist, on Norco -Dr. Lurlean Horns [facility] -chronic back pain and joint stiffness at baseline, denies new pain   3. HTN -on  Lisinopril  4.Myasthenia gravis -Oncellcept 108mBIDfor longer term immunosuppression, and mestinon TID -He is at high risk for infections due topriorchemo -Stablewith fatigue  5. Hypercalcemia,primary hyperparathyroidism -He takes Vitamin Dand multivitamin, he has been advised to avoid additional calcium supplement -06/28/2018 - PTH 110, total calcium 10.8 supporting primary perparathyroidism. -  Ca rising lately, now 11.3   6.Goal of care discussion  -The patient understands the goal of care is palliative. -He is full code now  Disposition: Philip [name] appears stable.  He completed 5 cycles of Capox with Xeloda 2000 mg BID 2 weeks on/1 week off.  He tolerates treatment well overall except fatigue and cold sensitivity.  No skin or GI side effects or significant residual neuropathy.  He is able to function well and perform daily activities without difficulty, the fatigue is severe to him when it comes to extracurricular activities.  I reviewed his PET scan shows a good treatment response, and the option of dose reducing his regimen if the fatigue is impacting his quality of life.  He prefers to proceed with the current dose for now.   CBC and CMP adequate to proceed with cycle 6 Capox today.  His hypercalcemia is increasing, Ca 11.3 today which may also be contributing to his fatigue.  No evidence of skeletal metastasis on recent PET scan.  I will give him additional fluids with treatment today and refer him to back to PCP for management of his primary hyperparathyroidism-related hypercalcemia.  He will return for follow-up and cycle 7 Capox in 3 weeks.  All questions were answered. The patient knows to call the clinic with any problems, questions or concerns. No barriers to learning were detected.     [name], NP 11/27/19

## 2019-11-27 ENCOUNTER — Other Ambulatory Visit: Payer: Self-pay

## 2019-11-27 ENCOUNTER — Inpatient Hospital Stay (HOSPITAL_BASED_OUTPATIENT_CLINIC_OR_DEPARTMENT_OTHER): Payer: Medicare Other | Admitting: Nurse Practitioner

## 2019-11-27 ENCOUNTER — Telehealth: Payer: Self-pay

## 2019-11-27 ENCOUNTER — Inpatient Hospital Stay: Payer: Medicare Other

## 2019-11-27 ENCOUNTER — Encounter: Payer: Self-pay | Admitting: Nurse Practitioner

## 2019-11-27 ENCOUNTER — Inpatient Hospital Stay: Payer: Medicare Other | Admitting: Nutrition

## 2019-11-27 VITALS — BP 138/70 | HR 81 | Temp 97.9°F | Resp 18 | Ht 72.0 in | Wt 295.2 lb

## 2019-11-27 DIAGNOSIS — C160 Malignant neoplasm of cardia: Secondary | ICD-10-CM

## 2019-11-27 DIAGNOSIS — Z5111 Encounter for antineoplastic chemotherapy: Secondary | ICD-10-CM | POA: Diagnosis not present

## 2019-11-27 LAB — CMP (CANCER CENTER ONLY)
ALT: 29 U/L (ref 0–44)
AST: 32 U/L (ref 15–41)
Albumin: 3.6 g/dL (ref 3.5–5.0)
Alkaline Phosphatase: 88 U/L (ref 38–126)
Anion gap: 7 (ref 5–15)
BUN: 14 mg/dL (ref 8–23)
CO2: 20 mmol/L — ABNORMAL LOW (ref 22–32)
Calcium: 11.3 mg/dL — ABNORMAL HIGH (ref 8.9–10.3)
Chloride: 110 mmol/L (ref 98–111)
Creatinine: 0.75 mg/dL (ref 0.61–1.24)
GFR, Est AFR Am: >60 mL/min (ref >60)
GFR, Estimated: >60 mL/min (ref >60)
Glucose, Bld: 114 mg/dL — ABNORMAL HIGH (ref 70–99)
Potassium: 4.4 mmol/L (ref 3.5–5.1)
Sodium: 137 mmol/L (ref 135–145)
Total Bilirubin: 0.7 mg/dL (ref 0.3–1.2)
Total Protein: 6.1 g/dL — ABNORMAL LOW (ref 6.5–8.1)

## 2019-11-27 LAB — CEA (IN HOUSE-CHCC): CEA (CHCC-In House): 4.04 ng/mL (ref 0.00–5.00)

## 2019-11-27 LAB — CBC WITH DIFFERENTIAL (CANCER CENTER ONLY)
Abs Immature Granulocytes: 0.03 10*3/uL (ref 0.00–0.07)
Basophils Absolute: 0.1 10*3/uL (ref 0.0–0.1)
Basophils Relative: 1 %
Eosinophils Absolute: 0.2 10*3/uL (ref 0.0–0.5)
Eosinophils Relative: 3 %
HCT: 37.2 % — ABNORMAL LOW (ref 39.0–52.0)
Hemoglobin: 13 g/dL (ref 13.0–17.0)
Immature Granulocytes: 1 %
Lymphocytes Relative: 14 %
Lymphs Abs: 0.8 10*3/uL (ref 0.7–4.0)
MCH: 37.4 pg — ABNORMAL HIGH (ref 26.0–34.0)
MCHC: 34.9 g/dL (ref 30.0–36.0)
MCV: 106.9 fL — ABNORMAL HIGH (ref 80.0–100.0)
Monocytes Absolute: 0.9 10*3/uL (ref 0.1–1.0)
Monocytes Relative: 15 %
Neutro Abs: 4.2 10*3/uL (ref 1.7–7.7)
Neutrophils Relative %: 66 %
Platelet Count: 140 10*3/uL — ABNORMAL LOW (ref 150–400)
RBC: 3.48 MIL/uL — ABNORMAL LOW (ref 4.22–5.81)
RDW: 17.2 % — ABNORMAL HIGH (ref 11.5–15.5)
WBC Count: 6.2 10*3/uL (ref 4.0–10.5)
nRBC: 0 % (ref 0.0–0.2)

## 2019-11-27 MED ORDER — OXALIPLATIN CHEMO INJECTION 100 MG/20ML
85.0000 mg/m2 | Freq: Once | INTRAVENOUS | Status: AC
  Administered 2019-11-27: 225 mg via INTRAVENOUS
  Filled 2019-11-27: qty 40

## 2019-11-27 MED ORDER — PALONOSETRON HCL INJECTION 0.25 MG/5ML
0.2500 mg | Freq: Once | INTRAVENOUS | Status: AC
  Administered 2019-11-27: 0.25 mg via INTRAVENOUS

## 2019-11-27 MED ORDER — SODIUM CHLORIDE 0.9 % IV SOLN
Freq: Once | INTRAVENOUS | Status: AC
  Filled 2019-11-27: qty 250

## 2019-11-27 MED ORDER — HEPARIN SOD (PORK) LOCK FLUSH 100 UNIT/ML IV SOLN
500.0000 [IU] | Freq: Once | INTRAVENOUS | Status: DC | PRN
  Filled 2019-11-27: qty 5

## 2019-11-27 MED ORDER — FAMOTIDINE 20 MG PO TABS: 20.0000 mg | ORAL_TABLET | Freq: Two times a day (BID) | ORAL | 1 refills | Status: DC

## 2019-11-27 MED ORDER — SODIUM CHLORIDE 0.9% FLUSH
10.0000 mL | INTRAVENOUS | Status: DC | PRN
  Filled 2019-11-27: qty 10

## 2019-11-27 MED ORDER — DEXAMETHASONE 4 MG PO TABS
ORAL_TABLET | ORAL | Status: AC
  Filled 2019-11-27: qty 1

## 2019-11-27 MED ORDER — DEXAMETHASONE 4 MG PO TABS
2.0000 mg | ORAL_TABLET | Freq: Once | ORAL | Status: AC
  Administered 2019-11-27: 2 mg via ORAL

## 2019-11-27 MED ORDER — SODIUM CHLORIDE 0.9 % IV SOLN
150.0000 mg | Freq: Once | INTRAVENOUS | Status: AC
  Administered 2019-11-27: 150 mg via INTRAVENOUS
  Filled 2019-11-27: qty 150

## 2019-11-27 MED ORDER — PALONOSETRON HCL INJECTION 0.25 MG/5ML
INTRAVENOUS | Status: AC
  Filled 2019-11-27: qty 5

## 2019-11-27 MED ORDER — DEXTROSE 5 % IV SOLN
Freq: Once | INTRAVENOUS | Status: AC
  Filled 2019-11-27: qty 250

## 2019-11-27 NOTE — Progress Notes (Signed)
Nutrition follow-up completed with patient during infusion for GE junction cancer. Weight is stable and documented as 295.2 pounds July 28 from 295.3 pounds July 7. Patient reports he is trying to eat the best he can.   Denies nutrition impact symptoms.   He has no questions or concerns.  Nutrition diagnosis: Food and nutrition related knowledge deficit improved.  Intervention: Encourage patient to continue small frequent meals and snacks with increased calories and protein for weight maintenance or very slow weight loss of 1 to 2 pounds weekly.  Monitoring, evaluation, goals: Patient will tolerate adequate calories and protein for weight maintenance.  Next visit: Wednesday, August 18 during infusion.  **Disclaimer: This note was dictated with voice recognition software. Similar sounding words can inadvertently be transcribed and this note may contain transcription errors which may not have been corrected upon publication of note.**

## 2019-11-27 NOTE — Telephone Encounter (Signed)
As requested today's progress note faxed to Dr. Olin Hauser of [facility]  [phone] Fax confirmation received

## 2019-11-27 NOTE — Patient Instructions (Signed)
[facility] Discharge Instructions for Patients Receiving Chemotherapy  Today you received the following chemotherapy agents: Oxaliplatin  To help prevent nausea and vomiting after your treatment, we encourage you to take your nausea medication as directed.   If you develop nausea and vomiting that is not controlled by your nausea medication, call the clinic.   BELOW ARE SYMPTOMS THAT SHOULD BE REPORTED IMMEDIATELY:  *FEVER GREATER THAN 100.5 F  *CHILLS WITH OR WITHOUT FEVER  NAUSEA AND VOMITING THAT IS NOT CONTROLLED WITH YOUR NAUSEA MEDICATION  *UNUSUAL SHORTNESS OF BREATH  *UNUSUAL BRUISING OR BLEEDING  TENDERNESS IN MOUTH AND THROAT WITH OR WITHOUT PRESENCE OF ULCERS  *URINARY PROBLEMS  *BOWEL PROBLEMS  UNUSUAL RASH Items with * indicate a potential emergency and should be followed up as soon as possible.  Feel free to call the clinic should you have any questions or concerns. The clinic phone number is [phone].  Please show the CHEMO ALERT CARD at check-in to the Emergency Department and triage nurse.   

## 2019-11-28 ENCOUNTER — Telehealth: Payer: Self-pay | Admitting: Nurse Practitioner

## 2019-11-28 NOTE — Telephone Encounter (Signed)
Scheduled per 7/28 los. Noted to give pt appt calendar.

## 2019-12-09 ENCOUNTER — Encounter (HOSPITAL_BASED_OUTPATIENT_CLINIC_OR_DEPARTMENT_OTHER): Payer: Self-pay | Admitting: *Deleted

## 2019-12-09 ENCOUNTER — Emergency Department (HOSPITAL_BASED_OUTPATIENT_CLINIC_OR_DEPARTMENT_OTHER)
Admission: EM | Admit: 2019-12-09 | Discharge: 2019-12-09 | Disposition: A | Discharge status: Home / Self Care | Payer: Medicare Other | Attending: Emergency Medicine | Admitting: Emergency Medicine

## 2019-12-09 ENCOUNTER — Other Ambulatory Visit: Payer: Self-pay

## 2019-12-09 ENCOUNTER — Emergency Department (HOSPITAL_BASED_OUTPATIENT_CLINIC_OR_DEPARTMENT_OTHER): Payer: Medicare Other

## 2019-12-09 DIAGNOSIS — L03115 Cellulitis of right lower limb: Secondary | ICD-10-CM | POA: Diagnosis not present

## 2019-12-09 DIAGNOSIS — Z7982 Long term (current) use of aspirin: Secondary | ICD-10-CM | POA: Diagnosis not present

## 2019-12-09 DIAGNOSIS — I10 Essential (primary) hypertension: Secondary | ICD-10-CM | POA: Insufficient documentation

## 2019-12-09 DIAGNOSIS — Z79899 Other long term (current) drug therapy: Secondary | ICD-10-CM | POA: Insufficient documentation

## 2019-12-09 DIAGNOSIS — J45909 Unspecified asthma, uncomplicated: Secondary | ICD-10-CM | POA: Diagnosis not present

## 2019-12-09 DIAGNOSIS — M79604 Pain in right leg: Secondary | ICD-10-CM | POA: Diagnosis present

## 2019-12-09 LAB — CBC WITH DIFFERENTIAL/PLATELET
Abs Immature Granulocytes: 0.03 10*3/uL (ref 0.00–0.07)
Basophils Absolute: 0.1 10*3/uL (ref 0.0–0.1)
Basophils Relative: 1 %
Eosinophils Absolute: 0.2 10*3/uL (ref 0.0–0.5)
Eosinophils Relative: 3 %
HCT: 37.6 % — ABNORMAL LOW (ref 39.0–52.0)
Hemoglobin: 12.9 g/dL — ABNORMAL LOW (ref 13.0–17.0)
Immature Granulocytes: 1 %
Lymphocytes Relative: 15 %
Lymphs Abs: 0.9 10*3/uL (ref 0.7–4.0)
MCH: 37.9 pg — ABNORMAL HIGH (ref 26.0–34.0)
MCHC: 34.3 g/dL (ref 30.0–36.0)
MCV: 110.6 fL — ABNORMAL HIGH (ref 80.0–100.0)
Monocytes Absolute: 0.8 10*3/uL (ref 0.1–1.0)
Monocytes Relative: 15 %
Neutro Abs: 3.8 10*3/uL (ref 1.7–7.7)
Neutrophils Relative %: 65 %
Platelets: 143 10*3/uL — ABNORMAL LOW (ref 150–400)
RBC: 3.4 MIL/uL — ABNORMAL LOW (ref 4.22–5.81)
RDW: 16.2 % — ABNORMAL HIGH (ref 11.5–15.5)
WBC: 5.8 10*3/uL (ref 4.0–10.5)
nRBC: 0 % (ref 0.0–0.2)

## 2019-12-09 LAB — BASIC METABOLIC PANEL
Anion gap: 10 (ref 5–15)
BUN: 12 mg/dL (ref 8–23)
CO2: 17 mmol/L — ABNORMAL LOW (ref 22–32)
Calcium: 10 mg/dL (ref 8.9–10.3)
Chloride: 108 mmol/L (ref 98–111)
Creatinine, Ser: 0.65 mg/dL (ref 0.61–1.24)
GFR calc Af Amer: >60 mL/min (ref >60)
GFR calc non Af Amer: >60 mL/min (ref >60)
Glucose, Bld: 104 mg/dL — ABNORMAL HIGH (ref 70–99)
Potassium: 4.2 mmol/L (ref 3.5–5.1)
Sodium: 135 mmol/L (ref 135–145)

## 2019-12-09 LAB — LACTIC ACID, PLASMA: Lactic Acid, Venous: 1.1 mmol/L (ref 0.5–1.9)

## 2019-12-09 MED ORDER — SODIUM CHLORIDE 0.9 % IV SOLN: INTRAVENOUS | Status: DC | PRN

## 2019-12-09 MED ORDER — CEFAZOLIN SODIUM-DEXTROSE 1-4 GM/50ML-% IV SOLN
1.0000 g | Freq: Once | INTRAVENOUS | Status: AC
  Administered 2019-12-09: 1 g via INTRAVENOUS
  Filled 2019-12-09: qty 50

## 2019-12-09 MED ORDER — SODIUM CHLORIDE 0.9 % IV BOLUS
500.0000 mL | Freq: Once | INTRAVENOUS | Status: AC
  Administered 2019-12-09: 500 mL via INTRAVENOUS

## 2019-12-09 MED ORDER — DOXYCYCLINE HYCLATE 100 MG PO CAPS
100.0000 mg | ORAL_CAPSULE | Freq: Two times a day (BID) | ORAL | 0 refills | Status: DC
Start: 2019-12-09 — End: 2020-02-10

## 2019-12-09 NOTE — ED Triage Notes (Signed)
5 days ago a hitch slipped hitting him in his right lower leg. A few days later his leg started swelling. Some redness, bruising, and large amount of swelling noted. He is ambulatory. He is a cancer patient. His last chemo was 7 days ago.

## 2019-12-09 NOTE — Discharge Instructions (Addendum)
It looks like you have a skin infection.  You should take the antibiotic as prescribed.  Take with food so it does not cause an upset stomach.   Recommend you follow-up in 2 days with the primary care doctor for a wound check.  If your symptoms worsen return to the emergency department or call 911.  Signs of worsening infection include fever, chills, surrounding worsening redness, pus draining from the leg.

## 2019-12-09 NOTE — ED Provider Notes (Signed)
[facility] EMERGENCY DEPARTMENT Provider Note   CSN: [id number] Arrival date & time: 12/09/19  1123     History Chief Complaint  Patient presents with  . Leg Injury    Philip [name] is a 74 y.o. male with past medical history significant for abdominal hernia, anemia, esophageal cancer with last chemo x1 week ago, hypertension, myasthenia gravis. Oncologist is Dr. Burr Medico. Takes daily aspirin.  HPI Patient presents to emergency department today with chief complaint of progressively worsening right leg pain x1 week.  Patient states he hit his right shin on a trailer hitch.  He did not see any open wounds however he had swelling start x2 days later. He is also reporting dull and sharp pain in left shin. Pain is intermittent, not worse with ambulation. Pain 5/10 in severity. Able to walk and bear weight on right leg. No medications for symptoms prior to arrival. Denies fever, chills, shortness of breath, chest pain, numbness, weakness, tingling.     Past Medical History:  Diagnosis Date  . Abdominal hernia   . Anemia   . Asthma   . Barrett's esophagus   . Cancer (HCC)    Esophagel cancer  . Cataract    Bil surgery  . Cough    over 2 weeks  . Fluid retention in legs   . GERD (gastroesophageal reflux disease)   . H/O hiatal hernia   . Hypertension   . Myasthenia gravis ([place])   . Sleep apnea    don't use c-pap at home/ tried for several years  . SOB (shortness of breath)     Patient Active Problem List   Diagnosis Date Noted  . Goals of care, counseling/discussion 08/02/2019  . Anemia 07/24/2018  . ARF (acute renal failure) ([place]) 07/24/2018  . Dehydration   . Hypotension   . Urinary retention   . AKI (acute kidney injury) ([place]) 07/23/2018  . Hypertension 07/23/2018  . UTI (urinary tract infection) 07/23/2018  . Odynophagia 07/23/2018  . Hyponatremia 07/23/2018  . Hypercalcemia 07/23/2018  . Pre-operative cardiovascular examination 02/22/2018  . Shortness of  breath 02/22/2018  . Malignant neoplasm of gastroesophageal junction ([place]) 02/01/2018  . Myasthenia gravis ([place]) 01/18/2017  . OSA on CPAP 01/18/2017  . Esophageal stricture 10/27/2016  . Dysphagia   . Abnormal esophagram   . ESOPHAGEAL REFLUX 05/08/2009  . BARRETTS ESOPHAGUS 05/08/2009  . DIVERTICULOSIS OF COLON 05/08/2009  . ABDOMINAL BLOATING 05/08/2009    Past Surgical History:  Procedure Laterality Date  . BACK SURGERY     5th lumbar  . BALLOON DILATION N/A 10/19/2016   Procedure: BALLOON DILATION;  Surgeon: Ladene Artist, MD;  Location: [facility];  Service: Endoscopy;  Laterality: N/A;  . COLONOSCOPY W/ POLYPECTOMY    . ESOPHAGOGASTRODUODENOSCOPY N/A 10/19/2016   Procedure: ESOPHAGOGASTRODUODENOSCOPY (EGD);  Surgeon: Ladene Artist, MD;  Location: [facility];  Service: Endoscopy;  Laterality: N/A;  . ESOPHAGOGASTRODUODENOSCOPY (EGD) WITH PROPOFOL N/A 02/12/2018   Procedure: ESOPHAGOGASTRODUODENOSCOPY (EGD) WITH PROPOFOL;  Surgeon: [name], MD;  Location: [facility];  Service: Gastroenterology;  Laterality: N/A;  . EUS N/A 02/12/2018   Procedure: UPPER ENDOSCOPIC ULTRASOUND (EUS) RADIAL;  Surgeon: [name], MD;  Location: [facility];  Service: Gastroenterology;  Laterality: N/A;  . EYE SURGERY Bilateral    cataract  . LUMBAR LAMINECTOMY     1980's       Family History  Problem Relation Age of Onset  . Heart disease Mother   . Lung cancer  Father   . Colon cancer Neg Hx   . Esophageal cancer Neg Hx   . Stomach cancer Neg Hx   . Rectal cancer Neg Hx     Social History   Tobacco Use  . Smoking status: Former Smoker    Packs/day: 1.00    Years: 7.00    Pack years: 7.00    Quit date: 12/21/1976    Years since quitting: 42.9  . Smokeless tobacco: Never Used  Vaping Use  . Vaping Use: Never used  Substance Use Topics  . Alcohol use: Yes    Comment: occ  . Drug use: No    Home Medications Prior to Admission  medications   Medication Sig Start Date End Date Taking? Authorizing Provider  ALBUTEROL IN Inhale 2 puffs into the lungs every 4 (four) hours as needed (shortness of breath or wheezing).     [provider]  amitriptyline (ELAVIL) 50 MG tablet Take 50 mg by mouth at bedtime. 08/30/19   [provider]  aspirin EC 81 MG tablet Take 81 mg by mouth daily.    [provider]  atorvastatin (LIPITOR) 10 MG tablet Take 10 mg by mouth at bedtime.  03/22/16   [provider]  doxycycline (VIBRAMYCIN) 100 MG capsule Take 1 capsule (100 mg total) by mouth 2 (two) times daily. 12/09/19   Chandrika Sandles E, PA-C  etodolac (LODINE) 200 MG capsule Take 200 mg by mouth every 8 (eight) hours as needed.    [provider]  famotidine (PEPCID) 20 MG tablet Take 1 tablet (20 mg total) by mouth 2 (two) times daily. 11/27/19   [name], NP  HYDROcodone-acetaminophen (NORCO) 7.5-325 MG tablet Take 1 tablet by mouth 3 (three) times daily as needed. 07/15/19   [provider]  lisinopril (ZESTRIL) 20 MG tablet Take 20 mg by mouth daily. 06/01/19   [provider]  Multiple Vitamin (MULTIVITAMIN) tablet Take 1 tablet by mouth daily.    [provider]  mycophenolate (CELLCEPT) 500 MG tablet Take 2 tablets (1,000 mg total) by mouth 2 (two) times daily. 06/12/19   Penumalli, Earlean Polka, MD  ondansetron (ZOFRAN) 8 MG tablet Take 1 tablet (8 mg total) by mouth 2 (two) times daily as needed for refractory nausea / vomiting. Start on day 3 after chemotherapy. 08/14/19   Truitt Merle, MD  prochlorperazine (COMPAZINE) 10 MG tablet Take 1 tablet (10 mg total) by mouth every 6 (six) hours as needed (Nausea or vomiting). 08/14/19   Truitt Merle, MD  pyridostigmine (MESTINON) 60 MG tablet Take 0.5-1 tablets (30-60 mg total) by mouth 3 (three) times daily. 06/12/19   Penumalli, Earlean Polka, MD  XELODA 500 MG tablet Take 4 tablets (2,000 mg total) by mouth 2 (two) times daily  after a meal. Take for 14 days, then hold for 7 days. Repeat every 21 days. 11/21/19   Truitt Merle, MD    Allergies    Patient has no known allergies.  Review of Systems   Review of Systems  Constitutional: Negative for chills and fever.  HENT: Negative for congestion, rhinorrhea, sinus pressure and sore throat.   Eyes: Negative for pain and redness.  Respiratory: Negative for cough, shortness of breath and wheezing.   Cardiovascular: Positive for leg swelling. Negative for chest pain and palpitations.  Gastrointestinal: Negative for abdominal pain, constipation, diarrhea, nausea and vomiting.  Genitourinary: Negative for dysuria.  Musculoskeletal: Negative for arthralgias, back pain, myalgias and neck pain.  Skin: Positive  for wound. Negative for rash.  Allergic/Immunologic: Positive for immunocompromised state.  Neurological: Negative for dizziness, syncope, weakness, numbness and headaches.  Psychiatric/Behavioral: Negative for confusion.    Physical Exam Updated Vital Signs BP (!) 107/58   Pulse 76   Temp 98.3 F (36.8 C) (Oral)   Resp 18   Ht 6' (1.829 m)   Wt 129.3 kg   SpO2 97%   BMI 38.65 kg/m   Physical Exam Vitals and nursing note reviewed.  Constitutional:      General: He is not in acute distress.    Appearance: He is not ill-appearing.  HENT:     Head: Normocephalic and atraumatic.     Right Ear: Tympanic membrane and external ear normal.     Left Ear: Tympanic membrane and external ear normal.     Nose: Nose normal.     Mouth/Throat:     Mouth: Mucous membranes are moist.     Pharynx: Oropharynx is clear.  Eyes:     General: No scleral icterus.       Right eye: No discharge.        Left eye: No discharge.     Extraocular Movements: Extraocular movements intact.     Conjunctiva/sclera: Conjunctivae normal.     Pupils: Pupils are equal, round, and reactive to light.  Neck:     Vascular: No JVD.  Cardiovascular:     Rate and Rhythm: Normal rate and  regular rhythm.     Pulses: Normal pulses.          Radial pulses are 2+ on the right side and 2+ on the left side.     Heart sounds: Normal heart sounds.  Pulmonary:     Comments: Lungs clear to auscultation in all fields. Symmetric chest rise. No wheezing, rales, or rhonchi. Abdominal:     Comments: Abdomen is soft, non-distended, and non-tender in all quadrants. No rigidity, no guarding. No peritoneal signs.  Musculoskeletal:        General: Normal range of motion.     Cervical back: Normal range of motion.     Right lower leg: Edema present.     Comments: No palpable cords on right lower extremity.  Skin:    General: Skin is warm and dry.     Capillary Refill: Capillary refill takes less than 2 seconds.     Comments: Please see media below  Skin with superficial abrasion. No purulent drainage or active bleeding. Surrounding ecchymosis  Neurological:     Mental Status: He is oriented to person, place, and time.     GCS: GCS eye subscore is 4. GCS verbal subscore is 5. GCS motor subscore is 6.     Comments: Fluent speech, no facial droop.  Psychiatric:        Behavior: Behavior normal.       ED Results / Procedures / Treatments   Labs (all labs ordered are listed, but only abnormal results are displayed) Labs Reviewed  BASIC METABOLIC PANEL - Abnormal; Notable for the following components:      Result Value   CO2 17 (*)    Glucose, Bld 104 (*)    All other components within normal limits  CBC WITH DIFFERENTIAL/PLATELET - Abnormal; Notable for the following components:   RBC 3.40 (*)    Hemoglobin 12.9 (*)    HCT 37.6 (*)    MCV 110.6 (*)    MCH 37.9 (*)    RDW 16.2 (*)    Platelets  143 (*)    All other components within normal limits  CULTURE, BLOOD (ROUTINE X 2)  CULTURE, BLOOD (ROUTINE X 2)  LACTIC ACID, PLASMA  CBC WITH DIFFERENTIAL/PLATELET    EKG None  Radiology DG Tibia/Fibula Right  Result Date: 12/09/2019 CLINICAL DATA:  Hit leg 1 week ago. Struck  by hitch in the right lower leg 5 days ago. Swelling with redness and bruising. EXAM: RIGHT TIBIA AND FIBULA - 2 VIEW COMPARISON:  None. FINDINGS: The cortical margins of the tibia and fibula are intact. There is no evidence of fracture or other focal bone lesions. Knee and ankle alignment are maintained with mild degenerative change. Diffuse generalized soft tissue edema of the lower leg. No soft tissue air. IMPRESSION: Diffuse soft tissue edema. No acute osseous abnormality. Electronically Signed   By: Keith Rake M.D.   On: 12/09/2019 14:42    Procedures Procedures (including critical care time)  Medications Ordered in ED Medications  0.9 %  sodium chloride infusion ( Intravenous Stopped 12/09/19 1545)  sodium chloride 0.9 % bolus 500 mL ( Intravenous Stopped 12/09/19 1507)  ceFAZolin (ANCEF) IVPB 1 g/50 mL premix ( Intravenous Stopped 12/09/19 1537)    ED Course  I have reviewed the triage vital signs and the nursing notes.  Pertinent labs & imaging results that were available during my care of the patient were reviewed by me and considered in my medical decision making (see chart for details).  Clinical Course as of Dec 08 1805  Mon Aug 09, 220  [id number] 74 year old male with esophageal cancer on active chemo here after injuring his right lower leg banging it on a trailer hitch.  Has been progressively more painful swollen and warm.  No fever.  Pressures a little low here leg does not have any open wounds but is tender swollen.  No calf cords noted.  Getting labs cultures antibiotics.  Possible admission.   [MB]    Clinical Course User Index [MB] Hayden Rasmussen, MD   Vitals:   12/09/19 1630 12/09/19 1650 12/09/19 1700 12/09/19 1730  BP: 138/84 138/84 120/65 129/78  Pulse: 63 72 67 70  Resp:  16    Temp:      TempSrc:      SpO2: 99% 100% 100% 100%  Weight:      Height:         MDM Rules/Calculators/A&P                          History provided by patient with additional  history obtained from chart review.    74 yo male with esophageal cancer currently undergoing chemo presenting with right lower extremity edema.  He is afebrile, not tachycardic, BP in triage is soft 108/57.  When rechecked during exam pressure is stable at 127/64, no significant hypotension to suggest sepsis. Exam with swelling to right lower extremity. No obvious open wounds, possible abrasion? No purulent drainage or active bleeding. DP pulse 2+ bilaterally. Full ROM of RLE, no compartment syndrome.  CBC without leukocytosis, hemoglobin and platelet count consistent with baseline. Lactic acid within normal range. BMP overall unremarkable.  Blood cultures collected. Will give small fluid bolus and IV antibiotics cellulitis. X-ray of right tib-fib with diffuse soft tissue edema. No gas.   Patient also seen by ED attending Dr. Melina Copa who agrees with plan for admission given he is immunocompromised for cellulitis.  Patient updated on plan and is asking to be discharged home.  He states he has to feed his cats and has no one nearby to help care for them.  I discussed the risk of worsening infection especially given he is currently undergoing chemo and immune system is already compromised.  Patient is adamant about being discharged home.  Will discharge home with prescription for doxycycline.  Discussed very strict return precautions which he was able to repeat back to me..  If p.o. antibiotics show improvement.  Patient will need wound check in 2 days by PCP. Skin marker used to outline erythema.  I have discussed my concerns as pt's  provider and the possibility that this may worsen. I have specifically discussed that without further evaluation I cannot guarantee there is not a life threatening event occuring.  Pt is A&Ox4, his own POA and states understanding of my concerns and the possible consequences.  I have made pt aware that this is an [place] discharge, but they may return at any time for further  evaluation and treatment.  Portions of this note were generated with [employer]. Dictation errors may occur despite best attempts at proofreading.  Final Clinical Impression(s) / ED Diagnoses Final diagnoses:  Cellulitis of right lower extremity    Rx / DC Orders ED Discharge Orders         Ordered    doxycycline (VIBRAMYCIN) 100 MG capsule  2 times daily     Discontinue  Reprint     12/09/19 1736           Cherre Robins, PA-C 12/09/19 1813    Hayden Rasmussen, MD 12/09/19 2133

## 2019-12-10 ENCOUNTER — Encounter: Payer: Self-pay | Admitting: Nurse Practitioner

## 2019-12-10 ENCOUNTER — Other Ambulatory Visit: Payer: Self-pay

## 2019-12-10 ENCOUNTER — Ambulatory Visit (HOSPITAL_COMMUNITY)
Admission: RE | Admit: 2019-12-10 | Discharge: 2019-12-10 | Disposition: A | Discharge status: Home / Self Care | Payer: Medicare Other | Source: Ambulatory Visit | Attending: Nurse Practitioner | Admitting: Nurse Practitioner

## 2019-12-10 ENCOUNTER — Telehealth: Payer: Self-pay | Admitting: Nurse Practitioner

## 2019-12-10 ENCOUNTER — Inpatient Hospital Stay: Payer: Medicare Other

## 2019-12-10 ENCOUNTER — Inpatient Hospital Stay: Payer: Medicare Other | Attending: Nurse Practitioner | Admitting: Nurse Practitioner

## 2019-12-10 VITALS — BP 111/62 | HR 86 | Temp 99.4°F | Resp 17 | Ht 72.0 in | Wt 296.6 lb

## 2019-12-10 DIAGNOSIS — Z79899 Other long term (current) drug therapy: Secondary | ICD-10-CM | POA: Diagnosis not present

## 2019-12-10 DIAGNOSIS — L03115 Cellulitis of right lower limb: Secondary | ICD-10-CM | POA: Insufficient documentation

## 2019-12-10 DIAGNOSIS — Z5111 Encounter for antineoplastic chemotherapy: Secondary | ICD-10-CM | POA: Insufficient documentation

## 2019-12-10 DIAGNOSIS — C160 Malignant neoplasm of cardia: Secondary | ICD-10-CM

## 2019-12-10 LAB — CBC WITH DIFFERENTIAL (CANCER CENTER ONLY)
Abs Immature Granulocytes: 0.01 K/uL (ref 0.00–0.07)
Basophils Absolute: 0.1 K/uL (ref 0.0–0.1)
Basophils Relative: 1 %
Eosinophils Absolute: 0.1 K/uL (ref 0.0–0.5)
Eosinophils Relative: 2 %
HCT: 34.5 % — ABNORMAL LOW (ref 39.0–52.0)
Hemoglobin: 11.9 g/dL — ABNORMAL LOW (ref 13.0–17.0)
Immature Granulocytes: 0 %
Lymphocytes Relative: 11 %
Lymphs Abs: 0.6 K/uL — ABNORMAL LOW (ref 0.7–4.0)
MCH: 37.7 pg — ABNORMAL HIGH (ref 26.0–34.0)
MCHC: 34.5 g/dL (ref 30.0–36.0)
MCV: 109.2 fL — ABNORMAL HIGH (ref 80.0–100.0)
Monocytes Absolute: 0.9 K/uL (ref 0.1–1.0)
Monocytes Relative: 16 %
Neutro Abs: 3.7 K/uL (ref 1.7–7.7)
Neutrophils Relative %: 70 %
Platelet Count: 145 K/uL — ABNORMAL LOW (ref 150–400)
RBC: 3.16 MIL/uL — ABNORMAL LOW (ref 4.22–5.81)
RDW: 16 % — ABNORMAL HIGH (ref 11.5–15.5)
WBC Count: 5.3 K/uL (ref 4.0–10.5)
nRBC: 0 % (ref 0.0–0.2)

## 2019-12-10 NOTE — Telephone Encounter (Signed)
I received a message that Philip [name] was seen in the ED on 8/9 for leg swelling and possible infection after an injury last week.  I reviewed the ED notes, labs, images, and photo of his leg.  It was recommended for him to be admitted for treatment for cellulitis however the patient left AMA because he did not anticipate admission and had not made arrangements for his pets at home, he has limited social support.  He was given a prescription for oral doxycycline 100 mg twice daily and went home in stable condition.  Today he feels well, states the antibiotic is "working."  I advised him to stop Xeloda early due to infection.  He is being scheduled for lab and follow-up with me today for wound check.  I suggest for him to make arrangements for his pets and pack a bag in the event we recommend admission for further treatment. Patient understands and agrees with the plan. I notified Dr. Benay Spice who is on-call.  Cira Rue, NP

## 2019-12-10 NOTE — Progress Notes (Signed)
[place]   [phone] [phone]   Clinic Follow up Note   Patient Care Team: [name], MD as PCP - General (Family Medicine) Mansouraty, [name], MD as Consulting Physician (Gastroenterology) Zonia Kief, MD (Rehabilitation) 12/10/2019  CHIEF COMPLAINT: Right lower leg cellulitis  SUMMARY OF ONCOLOGIC HISTORY: Oncology History Overview Note  Cancer Staging Malignant neoplasm of gastroesophageal junction [facility]) Staging form: Esophagus - Adenocarcinoma, AJCC 8th Edition - Clinical stage from 02/26/2018: Stage IIB (cT2, cN0, cM0, G3) - Signed by [name], NP on 02/26/2018    Malignant neoplasm of gastroesophageal junction ([place])  01/22/2018 Initial Biopsy   Diagnosis Surgical [P], GE junction nodule BX - POORLY 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   01/22/2018 Procedure   COLONOSCOPY IMPRESSION - Redundant colon. - The examination was otherwise normal on direct and retroflexion views. - No specimens collected.  UPPER ENDOSCOPY IMPRESSION: - Esophageal mucosal changes secondary to established short-segment Barrett's disease. - Mucosal nodule found in the esophagus. Biopsied. - Erythematous mucosa in the prepyloric region of the stomach. - Submucosal nodule found in the duodenum.    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 No esophageal primary identified. No findings of metastatic disease in the chest, abdomen, or pelvis. 2.  Aortic Atherosclerosis (ICD10-I70.0).   02/12/2018 Procedure   EUS Impression: - A mass was found in the distal esophagus into the gastroesophageal junction and extending to proximal cardia. A tissue diagnosis was obtained prior to this exam consistent with adenocarcinoma. This was staged at T2N0Mx, because of a loss of interface of the muscularis  propria as the distal GE Junction becomes the cardia and the mass moves into this region, but there are many more regions and images that suggest a T1 lesion into the submucosa.   05/29/2018 Imaging   NM PET Image Initial (PI) Skull Base To Thigh  IMPRESSION: 1. Hypermetabolic focus at the GE junction consistent with know adenocarcinoma. 2. No evidence of metastatic adenopathy in the mediastinum or upper abdomen. 3. No evidence of liver metastasis.  No pulmonary metastasis.   06/06/2018 -  Chemotherapy   Weeklycarboplatin and Taxol, with concurrent radiationstarting on 06/06/2018-07/10/18   06/06/2018 - 07/16/2018 Radiation Therapy   Concurrent chemoRT with Dr. Harvel Ricks on 06/06/2018-07/16/18   11/06/2018 PET scan   PET 11/06/18  IMPRESSION: 1. No residual hypermetabolic activity at the gastroesophageal junction. 2. No evidence of metastatic disease.   12/22/2018 Procedure   Upper Endoscopy by Dr. Loletha Carrow 12/22/18  IMPRESSION - Esophageal mucosal changes secondary to established short-segment Barrett's disease. - Erythematous, friable (with contact bleeding), nodular mucosa in the esophagus. Biopsied. May be inflammation from radiation. - Erythematous and petechial mucosa in the gastric fundus and gastric body. Appears likely related to radiation. - Multiple fundic gland polyps. - Eroded and nodular mucosa in the antrum. Biopsied. - Submucosal nodule found in the duodenum. Stable in size from prior exams.    12/22/2018 Pathology Results   Diagnosis 12/22/18 1. Surgical [P], random gastric BX - CHRONIC INACTIVE GASTRITIS. - THERE IS NO EVIDENCE OF HELICOBACTER PYLORI, DYSPLASIA, OR MALIGNANCY. - SEE COMMENT. 2. Surgical [P], esophagus, GE junction - GASTROESOPHAGEAL JUNCTION MUCOSA WITH MILD INFLAMMATION CONSISTENT WITH GASTROESOPHAGEAL REFLUX. - THERE IS NO EVIDENCE OF GOBLET CELL METAPLASIA, DYSPLASIA OR MALIGNANCY. - SEE COMMENT.   07/08/2019 Procedure   Upper Endoscopy by Dr.  Loletha Carrow 07/08/19  IMPRESSION - Nodule found in the esophagus. Biopsied to rule out recurrent malignancy vs radiation effect. - Food (residue) in the stomach. - Retained food in the duodenum.   07/08/2019 Pathology Results   Diagnosis Surgical [P], distal esophagus - POORLY DIFFERENTIATED ADENOCARCINOMA WITH SIGNET RING FEATURES   07/25/2019 PET scan   IMPRESSION: 1. Signs of disease recurrence at the gastroesophageal junction with local nodal disease as described.   08/14/2019 -  Chemotherapy   CAPOX q3weeks with oral Xeloda 2000mg  BID 2 weeks on/1 week off   11/05/2019 PET scan   IMPRESSION: 1. Interval decrease in metabolic activity at the gastroesophageal junction. 2. Interval decrease in size and metabolic activity of gastrohepatic ligament metastatic lymph node. 3. No evidence of new or progressive disease in the chest, abdomen, or pelvis.     INTERVAL HISTORY: Mr. Hellberg returns for symptom management visit as scheduled.  Five to 6 days ago he dropped a trailer hitch on his right leg injuring his knee and lower extremity.  He did not pay much attention to it until yesterday when it became red, warm, and swollen. He was seen in the ED on 12/09/2019, CBC, BMP, lactic acid were unremarkable, blood cultures no growth to date.  Plain film showed soft tissue edema without osseous abnormality.  He was given a dose of Ancef and recommended for admission, however he declined to stay because he had not made arrangements for his pet care, he has limited social support.  He went home in stable condition, s/p 2 doses of oral doxycycline. Last dose of oxaliplatin on 11/27/19, he is day 14 Xeloda took last dose early this morning at 4 AM, would complete the current cycle tonight.  He thinks the doxycycline is "working."  Denies fever or chills, lightheadedness, dizziness, or weakness.  No recent cough or chest pain.  He has mild exertional dyspnea at his baseline.  Denies right leg or new pain except chronic  back pain.   MEDICAL HISTORY:  Past Medical History:  Diagnosis Date   Abdominal hernia    Anemia    Asthma    Barrett's esophagus    Cancer (HCC)    Esophagel cancer   Cataract    Bil surgery   Cough    over 2 weeks   Fluid retention in legs    GERD (gastroesophageal reflux disease)    H/O hiatal hernia    Hypertension    Myasthenia gravis ([place])    Sleep apnea    don't use c-pap at home/ tried for several years   SOB (shortness of breath)     SURGICAL HISTORY: Past Surgical History:  Procedure Laterality Date   BACK SURGERY     5th lumbar   BALLOON DILATION N/A 10/19/2016   Procedure: BALLOON DILATION;  Surgeon: Ladene Artist, MD;  Location: [facility];  Service: Endoscopy;  Laterality: N/A;   COLONOSCOPY W/ POLYPECTOMY     ESOPHAGOGASTRODUODENOSCOPY N/A 10/19/2016   Procedure: ESOPHAGOGASTRODUODENOSCOPY (EGD);  Surgeon: Ladene Artist, MD;  Location: [facility];  Service: Endoscopy;  Laterality: N/A;   ESOPHAGOGASTRODUODENOSCOPY (EGD) WITH PROPOFOL N/A 02/12/2018   Procedure: ESOPHAGOGASTRODUODENOSCOPY (EGD) WITH PROPOFOL;  Surgeon: [name], MD;  Location: [facility];  Service: Gastroenterology;  Laterality: N/A;   EUS N/A 02/12/2018   Procedure: UPPER ENDOSCOPIC ULTRASOUND (EUS) RADIAL;  Surgeon: Irving Copas., MD;  Location: [facility];  Service: Gastroenterology;  Laterality: N/A;   EYE SURGERY Bilateral    cataract   LUMBAR LAMINECTOMY  1980's    I have reviewed the social history and family history with the patient and they are unchanged from previous note.  ALLERGIES:  has No Known Allergies.  MEDICATIONS:  Current Outpatient Medications  Medication Sig Dispense Refill   ALBUTEROL IN Inhale 2 puffs into the lungs every 4 (four) hours as needed (shortness of breath or wheezing).      amitriptyline (ELAVIL) 50 MG tablet Take 50 mg by mouth at bedtime.     aspirin EC 81 MG tablet Take 81 mg by  mouth daily.     atorvastatin (LIPITOR) 10 MG tablet Take 10 mg by mouth at bedtime.      doxycycline (VIBRAMYCIN) 100 MG capsule Take 1 capsule (100 mg total) by mouth 2 (two) times daily. 20 capsule 0   etodolac (LODINE) 200 MG capsule Take 200 mg by mouth every 8 (eight) hours as needed.     famotidine (PEPCID) 20 MG tablet Take 1 tablet (20 mg total) by mouth 2 (two) times daily. 60 tablet 1   HYDROcodone-acetaminophen (NORCO) 7.5-325 MG tablet Take 1 tablet by mouth 3 (three) times daily as needed.     lisinopril (ZESTRIL) 20 MG tablet Take 20 mg by mouth daily.     Multiple Vitamin (MULTIVITAMIN) tablet Take 1 tablet by mouth daily.     mycophenolate (CELLCEPT) 500 MG tablet Take 2 tablets (1,000 mg total) by mouth 2 (two) times daily. 120 tablet 12   ondansetron (ZOFRAN) 8 MG tablet Take 1 tablet (8 mg total) by mouth 2 (two) times daily as needed for refractory nausea / vomiting. Start on day 3 after chemotherapy. 30 tablet 1   prochlorperazine (COMPAZINE) 10 MG tablet Take 1 tablet (10 mg total) by mouth every 6 (six) hours as needed (Nausea or vomiting). 30 tablet 1   pyridostigmine (MESTINON) 60 MG tablet Take 0.5-1 tablets (30-60 mg total) by mouth 3 (three) times daily. 90 tablet 12   XELODA 500 MG tablet Take 4 tablets (2,000 mg total) by mouth 2 (two) times daily after a meal. Take for 14 days, then hold for 7 days. Repeat every 21 days. 112 tablet 1   Current Facility-Administered Medications  Medication Dose Route Frequency Provider Last Rate Last Admin   0.9 %  sodium chloride infusion  500 mL Intravenous Once Nelida Meuse III, MD        PHYSICAL EXAMINATION:  Vitals:   12/10/19 1305  BP: 111/62  Pulse: 86  Resp: 17  Temp: 99.4 F (37.4 C)  SpO2: 99%   Filed Weights   12/10/19 1305  Weight: 296 lb 9.6 oz (134.5 kg)    GENERAL:alert, no distress and comfortable SKIN: see image below EYES: sclera clear LUNGS: clear with normal breathing  effort HEART: regular rate & rhythm ABDOMEN:abdomen soft, non-tender and normal bowel sounds Musculoskeletal: Ecchymosis at the right knee with moderate edema, erythema, and warmth to the right anterior medial lower leg.  Borders marked by ED NEURO: alert & oriented x 3 with fluent speech, normal gait        LABORATORY DATA:  I have reviewed the data as listed CBC Latest Ref Rng & Units 12/10/2019 12/09/2019 11/27/2019  WBC 4.0 - 10.5 K/uL 5.3 5.8 6.2  Hemoglobin 13.0 - 17.0 g/dL 11.9(L) 12.9(L) 13.0  Hematocrit 39 - 52 % 34.5(L) 37.6(L) 37.2(L)  Platelets 150 - 400 K/uL 145(L) 143(L) 140(L)     CMP Latest Ref Rng & Units 12/09/2019 11/27/2019 11/06/2019  Glucose 70 -  99 mg/dL 104(H) 114(H) 112(H)  BUN 8 - 23 mg/dL 12 14 19   Creatinine 0.61 - 1.24 mg/dL 0.65 0.75 0.97  Sodium 135 - 145 mmol/L 135 137 137  Potassium 3.5 - 5.1 mmol/L 4.2 4.4 4.5  Chloride 98 - 111 mmol/L 108 110 109  CO2 22 - 32 mmol/L 17(L) 20(L) 18(L)  Calcium 8.9 - 10.3 mg/dL 10.0 11.3(H) 11.2(H)  Total Protein 6.5 - 8.1 g/dL - 6.1(L) 6.5  Total Bilirubin 0.3 - 1.2 mg/dL - 0.7 0.9  Alkaline Phos 38 - 126 U/L - 88 99  AST 15 - 41 U/L - 32 29  ALT 0 - 44 U/L - 29 29      RADIOGRAPHIC STUDIES: I have personally reviewed the radiological images as listed and agreed with the findings in the report. DG Tibia/Fibula Right  Result Date: 12/09/2019 CLINICAL DATA:  Hit leg 1 week ago. Struck by hitch in the right lower leg 5 days ago. Swelling with redness and bruising. EXAM: RIGHT TIBIA AND FIBULA - 2 VIEW COMPARISON:  None. FINDINGS: The cortical margins of the tibia and fibula are intact. There is no evidence of fracture or other focal bone lesions. Knee and ankle alignment are maintained with mild degenerative change. Diffuse generalized soft tissue edema of the lower leg. No soft tissue air. IMPRESSION: Diffuse soft tissue edema. No acute osseous abnormality. Electronically Signed   By: Keith Rake M.D.   On:  12/09/2019 14:42     ASSESSMENT & PLAN:   1. Right lower leg injury and cellulitis (on 8/4 or 8/5?), ED visit 8/9 given Ancef x1 and oral doxycycline 100 mg BID. Patient declined admission  2. Recurrent GEJ carcinoma, on CAPOX s/p cycle 6 on 11/27/19  3. Myasthenia Gravis  4. Co-morbidities: Chronic back pain, HTN, primary hyperparathyroid hypercalcemia  Disposition: Mr. Cabiness appears clinically stable.  He suffered a right lower leg injury now with signs of cellulitis.  S/p ED evaluation and work-up, Ancef x1 on 8/9 and began oral doxycycline 100 mg twice daily, today is day 2 of therapy.  BP is soft with low-grade fever temp 99.4, he is asymptomatic.  Based on the markings from ED provider, the erythema appears to be improving at the anterior right lower leg.  CBC shows normal WBC and ANC but he is on chemo.  He appears stable for out patient management.  Antibiotic therapy was discussed with the pharmacist today, we recommend to continue doxycycline 100 mg twice daily for now which provides MRSA coverage. Pt will monitor for worsening erythema, warmth, swelling, pain, fever/chills, tachycardia, or lethargy.    Given that he has cancer receiving chemotherapy and high risk for thrombosis, a stat ultrasound was obtained today which is negative for DVT in the right lower extremity.  He will stop Oral Xeloda now due to infection. He is due to begin cycle 7 on 12/18/19, may postpone if he has delayed healing. He will return for close monitoring including CBC and wound check in our symptom management clinic on 12/12/2019.  Plan reviewed with Dr. Burr Medico.    Orders Placed This Encounter  Procedures   CBC with Differential ([facility] Only)    Standing Status:   Future    Standing Expiration Date:   12/09/2020   All questions were answered. The patient knows to call the clinic with any problems, questions or concerns. No barriers to learning were detected.  Total encounter time was 30 minutes.       Wilhemina Cash  Kalman Shan, NP 12/10/19

## 2019-12-10 NOTE — Progress Notes (Signed)
Right lower extremity venous duplex has been completed. Preliminary results can be found in CV Proc through chart review.  Results were given to Cira Rue NP.  12/10/19 2:20 PM Carlos Levering RVT

## 2019-12-11 MED FILL — XELODA 500 MG TABLET: 500 | 21 days supply | Qty: 112 | Fill #1

## 2019-12-12 ENCOUNTER — Other Ambulatory Visit: Payer: Self-pay

## 2019-12-12 ENCOUNTER — Inpatient Hospital Stay: Payer: Medicare Other

## 2019-12-12 ENCOUNTER — Inpatient Hospital Stay (HOSPITAL_BASED_OUTPATIENT_CLINIC_OR_DEPARTMENT_OTHER): Payer: Medicare Other | Admitting: Nurse Practitioner

## 2019-12-12 ENCOUNTER — Encounter: Payer: Self-pay | Admitting: Nurse Practitioner

## 2019-12-12 VITALS — BP 131/64 | HR 87 | Temp 98.8°F | Resp 18 | Ht 72.0 in | Wt 295.7 lb

## 2019-12-12 DIAGNOSIS — Z5111 Encounter for antineoplastic chemotherapy: Secondary | ICD-10-CM | POA: Diagnosis not present

## 2019-12-12 DIAGNOSIS — L03115 Cellulitis of right lower limb: Secondary | ICD-10-CM

## 2019-12-12 LAB — CBC WITH DIFFERENTIAL (CANCER CENTER ONLY)
Abs Immature Granulocytes: 0.01 10*3/uL (ref 0.00–0.07)
Basophils Absolute: 0.1 10*3/uL (ref 0.0–0.1)
Basophils Relative: 1 %
Eosinophils Absolute: 0.2 10*3/uL (ref 0.0–0.5)
Eosinophils Relative: 3 %
HCT: 37.3 % — ABNORMAL LOW (ref 39.0–52.0)
Hemoglobin: 12.5 g/dL — ABNORMAL LOW (ref 13.0–17.0)
Immature Granulocytes: 0 %
Lymphocytes Relative: 13 %
Lymphs Abs: 0.7 10*3/uL (ref 0.7–4.0)
MCH: 37.5 pg — ABNORMAL HIGH (ref 26.0–34.0)
MCHC: 33.5 g/dL (ref 30.0–36.0)
MCV: 112 fL — ABNORMAL HIGH (ref 80.0–100.0)
Monocytes Absolute: 1 10*3/uL (ref 0.1–1.0)
Monocytes Relative: 17 %
Neutro Abs: 3.8 10*3/uL (ref 1.7–7.7)
Neutrophils Relative %: 66 %
Platelet Count: 147 10*3/uL — ABNORMAL LOW (ref 150–400)
RBC: 3.33 MIL/uL — ABNORMAL LOW (ref 4.22–5.81)
RDW: 16.5 % — ABNORMAL HIGH (ref 11.5–15.5)
WBC Count: 5.8 10*3/uL (ref 4.0–10.5)
nRBC: 0 % (ref 0.0–0.2)

## 2019-12-12 MED ORDER — CEPHALEXIN 500 MG PO CAPS: 500.0000 mg | ORAL_CAPSULE | Freq: Four times a day (QID) | ORAL | 0 refills | Status: DC

## 2019-12-12 NOTE — Progress Notes (Signed)
Philip [name]   [phone] Fax:([phone]) [phone]   Clinic Follow up Note   Patient Care Team: [name], MD as PCP - General (Family Medicine) Mansouraty, [name], MD as Consulting Physician (Gastroenterology) Zonia Kief, MD (Rehabilitation) 12/12/2019  CHIEF COMPLAINT: Follow-up right leg cellulitis  SUMMARY OF ONCOLOGIC HISTORY: Oncology History Overview Note  Cancer Staging Malignant neoplasm of gastroesophageal junction [facility]) Staging form: Esophagus - Adenocarcinoma, AJCC 8th Edition - Clinical stage from 02/26/2018: Stage IIB (cT2, cN0, cM0, G3) - Signed by [name], NP on 02/26/2018    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   01/22/2018 Procedure   COLONOSCOPY IMPRESSION - Redundant colon. - The examination was otherwise normal on direct and retroflexion views. - No specimens collected.  UPPER ENDOSCOPY IMPRESSION: - Esophageal mucosal changes secondary to established short-segment Barrett's disease. - Mucosal nodule found in the esophagus. Biopsied. - Erythematous mucosa in the prepyloric region of the stomach. - Submucosal nodule found in the duodenum.    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 No esophageal primary identified. No findings of metastatic disease in the chest, abdomen, or pelvis. 2.  Aortic Atherosclerosis (ICD10-I70.0).   02/12/2018 Procedure   EUS Impression: - A mass was found in the distal esophagus into the gastroesophageal junction and extending to proximal cardia. A tissue diagnosis was obtained prior to this exam consistent with adenocarcinoma. This was staged at T2N0Mx, because of a loss of interface of the muscularis  propria as the distal GE Junction becomes the cardia and the mass moves into this region, but there are many more regions and images that suggest a T1 lesion into the submucosa.   05/29/2018 Imaging   NM PET Image Initial (PI) Skull Base To Thigh  IMPRESSION: 1. Hypermetabolic focus at the GE junction consistent with know adenocarcinoma. 2. No evidence of metastatic adenopathy in the mediastinum or upper abdomen. 3. No evidence of liver metastasis.  No pulmonary metastasis.   06/06/2018 -  Chemotherapy   Weeklycarboplatin and Taxol, with concurrent radiationstarting on 06/06/2018-07/10/18   06/06/2018 - 07/16/2018 Radiation Therapy   Concurrent chemoRT with Dr. Harvel Ricks on 06/06/2018-07/16/18   11/06/2018 PET scan   PET 11/06/18  IMPRESSION: 1. No residual hypermetabolic activity at the gastroesophageal junction. 2. No evidence of metastatic disease.   12/22/2018 Procedure   Upper Endoscopy by Dr. Loletha Carrow 12/22/18  IMPRESSION - Esophageal mucosal changes secondary to established short-segment Barrett's disease. - Erythematous, friable (with contact bleeding), nodular mucosa in the esophagus. Biopsied. May be inflammation from radiation. - Erythematous and petechial mucosa in the gastric fundus and gastric body. Appears likely related to radiation. - Multiple fundic gland polyps. - Eroded and nodular mucosa in the antrum. Biopsied. - Submucosal nodule found in the duodenum. Stable in size from prior exams.    12/22/2018 Pathology Results   Diagnosis 12/22/18 1. Surgical [P], random gastric BX - CHRONIC INACTIVE GASTRITIS. - THERE IS NO EVIDENCE OF HELICOBACTER PYLORI, DYSPLASIA, OR MALIGNANCY. - SEE COMMENT. 2. Surgical [P], esophagus, GE junction - GASTROESOPHAGEAL JUNCTION MUCOSA WITH MILD INFLAMMATION CONSISTENT WITH GASTROESOPHAGEAL REFLUX. - THERE IS NO EVIDENCE OF GOBLET CELL METAPLASIA, DYSPLASIA OR MALIGNANCY. - SEE COMMENT.   07/08/2019 Procedure   Upper Endoscopy by Dr.  Loletha Carrow 07/08/19  IMPRESSION - Nodule found in the esophagus. Biopsied to rule out recurrent malignancy vs radiation effect. - Food (residue) in the stomach. - Retained food in the duodenum.   07/08/2019 Pathology Results   Diagnosis Surgical [P], distal esophagus - POORLY DIFFERENTIATED ADENOCARCINOMA WITH SIGNET RING FEATURES   07/25/2019 PET scan   IMPRESSION: 1. Signs of disease recurrence at the gastroesophageal junction with local nodal disease as described.   08/14/2019 -  Chemotherapy   CAPOX q3weeks with oral Xeloda 2000mg  BID 2 weeks on/1 week off   11/05/2019 PET scan   IMPRESSION: 1. Interval decrease in metabolic activity at the gastroesophageal junction. 2. Interval decrease in size and metabolic activity of gastrohepatic ligament metastatic lymph node. 3. No evidence of new or progressive disease in the chest, abdomen, or pelvis.     INTERVAL HISTORY: Mr. Hauser returns for wound check as scheduled.  He continues doxycycline twice daily, has taken 7 tabs.  His right leg remains red and swollen, not painful.  He is able to bear weight.  He measures his leg circumference daily, it is smaller this morning.  He denies fever or chills.  He feels well otherwise.     MEDICAL HISTORY:  Past Medical History:  Diagnosis Date  . Abdominal hernia   . Anemia   . Asthma   . Barrett's esophagus   . Cancer (HCC)    Esophagel cancer  . Cataract    Bil surgery  . Cough    over 2 weeks  . Fluid retention in legs   . GERD (gastroesophageal reflux disease)   . H/O hiatal hernia   . Hypertension   . Myasthenia gravis ([place])   . Sleep apnea    don't use c-pap at home/ tried for several years  . SOB (shortness of breath)     SURGICAL HISTORY: Past Surgical History:  Procedure Laterality Date  . BACK SURGERY     5th lumbar  . BALLOON DILATION N/A 10/19/2016   Procedure: BALLOON DILATION;  Surgeon: Ladene Artist, MD;  Location: [facility];  Service: Endoscopy;   Laterality: N/A;  . COLONOSCOPY W/ POLYPECTOMY    . ESOPHAGOGASTRODUODENOSCOPY N/A 10/19/2016   Procedure: ESOPHAGOGASTRODUODENOSCOPY (EGD);  Surgeon: Ladene Artist, MD;  Location: [facility];  Service: Endoscopy;  Laterality: N/A;  . ESOPHAGOGASTRODUODENOSCOPY (EGD) WITH PROPOFOL N/A 02/12/2018   Procedure: ESOPHAGOGASTRODUODENOSCOPY (EGD) WITH PROPOFOL;  Surgeon: [name], MD;  Location: [facility];  Service: Gastroenterology;  Laterality: N/A;  . EUS N/A 02/12/2018   Procedure: UPPER ENDOSCOPIC ULTRASOUND (EUS) RADIAL;  Surgeon: [name], MD;  Location: [place];  Service: Gastroenterology;  Laterality: N/A;  . EYE SURGERY Bilateral    cataract  . LUMBAR LAMINECTOMY     1980's    I have reviewed the social history and family history with the patient and they are unchanged from previous note.  ALLERGIES:  has No Known Allergies.  MEDICATIONS:  Current Outpatient Medications  Medication Sig Dispense Refill  . ALBUTEROL IN Inhale 2 puffs into the lungs every 4 (four) hours as needed (shortness of breath or wheezing).     [name] amitriptyline (ELAVIL) 50 MG tablet Take 50 mg by mouth at bedtime.    [name] aspirin EC 81 MG tablet Take 81 mg by mouth daily.    [name] atorvastatin (LIPITOR) 10 MG tablet Take 10 mg by mouth at bedtime.     . cephALEXin (KEFLEX) 500 MG capsule Take 1 capsule (500 mg total) by mouth 4 (  four) times daily. 28 capsule 0  . doxycycline (VIBRAMYCIN) 100 MG capsule Take 1 capsule (100 mg total) by mouth 2 (two) times daily. 20 capsule 0  . etodolac (LODINE) 200 MG capsule Take 200 mg by mouth every 8 (eight) hours as needed.    . famotidine (PEPCID) 20 MG tablet Take 1 tablet (20 mg total) by mouth 2 (two) times daily. 60 tablet 1  . HYDROcodone-acetaminophen (NORCO) 7.5-325 MG tablet Take 1 tablet by mouth 3 (three) times daily as needed.    [name] lisinopril (ZESTRIL) 20 MG tablet Take 20 mg by mouth daily.    . Multiple Vitamin (MULTIVITAMIN)  tablet Take 1 tablet by mouth daily.    . mycophenolate (CELLCEPT) 500 MG tablet Take 2 tablets (1,000 mg total) by mouth 2 (two) times daily. 120 tablet 12  . ondansetron (ZOFRAN) 8 MG tablet Take 1 tablet (8 mg total) by mouth 2 (two) times daily as needed for refractory nausea / vomiting. Start on day 3 after chemotherapy. 30 tablet 1  . prochlorperazine (COMPAZINE) 10 MG tablet Take 1 tablet (10 mg total) by mouth every 6 (six) hours as needed (Nausea or vomiting). 30 tablet 1  . pyridostigmine (MESTINON) 60 MG tablet Take 0.5-1 tablets (30-60 mg total) by mouth 3 (three) times daily. 90 tablet 12  . XELODA 500 MG tablet Take 4 tablets (2,000 mg total) by mouth 2 (two) times daily after a meal. Take for 14 days, then hold for 7 days. Repeat every 21 days. 112 tablet 1   Current Facility-Administered Medications  Medication Dose Route Frequency Provider Last Rate Last Admin  . 0.9 %  sodium chloride infusion  500 mL Intravenous Once Nelida Meuse III, MD        PHYSICAL EXAMINATION:  Vitals:   12/12/19 1134  BP: 131/64  Pulse: 87  Resp: 18  Temp: 98.8 F (37.1 C)  SpO2: 99%   Filed Weights   12/12/19 1134  Weight: 295 lb 11.2 oz (134.1 kg)    GENERAL:alert, no distress and comfortable SKIN: Right lower extremity with bruising at the knee, erythema, warmth, and edema to the right lower anterior and medial leg.  See image below and markings per ED. LUNGS:  normal breathing effort NEURO: alert & oriented x 3 with fluent speech        LABORATORY DATA:  I have reviewed the data as listed CBC Latest Ref Rng & Units 12/12/2019 12/10/2019 12/09/2019  WBC 4.0 - 10.5 K/uL 5.8 5.3 5.8  Hemoglobin 13.0 - 17.0 g/dL 12.5(L) 11.9(L) 12.9(L)  Hematocrit 39 - 52 % 37.3(L) 34.5(L) 37.6(L)  Platelets 150 - 400 K/uL 147(L) 145(L) 143(L)     RADIOGRAPHIC STUDIES: I have personally reviewed the radiological images as listed and agreed with the findings in the report. VAS [place] LOWER  EXTREMITY VENOUS (DVT)  Result Date: 12/10/2019  Lower Venous DVTStudy Indications: Swelling, and Erythema.  Limitations: Body habitus and poor ultrasound/tissue interface. Comparison Study: No prior studies. Performing Technologist: Oliver Hum RVT  Examination Guidelines: A complete evaluation includes B-mode imaging, spectral Doppler, color Doppler, and power Doppler as needed of all accessible portions of each vessel. Bilateral testing is considered an integral part of a complete examination. Limited examinations for reoccurring indications may be performed as noted. The reflux portion of the exam is performed with the patient in reverse Trendelenburg.  +---------+---------------+---------+-----------+----------+--------------+ RIGHT    CompressibilityPhasicitySpontaneityPropertiesThrombus Aging +---------+---------------+---------+-----------+----------+--------------+ CFV      Full  Yes      Yes                                 +---------+---------------+---------+-----------+----------+--------------+ SFJ      Full                                                        +---------+---------------+---------+-----------+----------+--------------+ FV Prox  Full                                                        +---------+---------------+---------+-----------+----------+--------------+ FV Mid   Full                                                        +---------+---------------+---------+-----------+----------+--------------+ FV DistalFull                                                        +---------+---------------+---------+-----------+----------+--------------+ PFV      Full                                                        +---------+---------------+---------+-----------+----------+--------------+ POP      Full           Yes      Yes                                  +---------+---------------+---------+-----------+----------+--------------+ PTV      Full                                                        +---------+---------------+---------+-----------+----------+--------------+ PERO                                                  Not visualized +---------+---------------+---------+-----------+----------+--------------+   +----+---------------+---------+-----------+----------+--------------+ LEFTCompressibilityPhasicitySpontaneityPropertiesThrombus Aging +----+---------------+---------+-----------+----------+--------------+ CFV Full           Yes      Yes                                 +----+---------------+---------+-----------+----------+--------------+     Summary: RIGHT: - There is no evidence of deep vein thrombosis in the lower extremity. However, portions of this  examination were limited- see technologist comments above.  - No cystic structure found in the popliteal fossa.  LEFT: - No evidence of common femoral vein obstruction.  *See table(s) above for measurements and observations. Electronically signed by Harold Barban MD on 12/10/2019 at 10:24:16 PM.    Final      ASSESSMENT & PLAN:   1. Right lower leg injury and cellulitis (on 8/4 or 8/5?), ED visit 8/9 given Ancef x1 and oral doxycycline 100 mg BID.  2. Recurrent GEJ carcinoma, on CAPOX s/p cycle 6 on 11/27/19  3. Myasthenia Gravis  4. Co-morbidities: Chronic back pain, HTN, primary hyperparathyroid hypercalcemia  Disposition:  Mr. Kadrmas appears stable. The right leg cellulitis is stable on oral doxycycline 100 mg BID. Doppler on 8/10 was negative for DVT.  Today's BP improved, afebrile. No leukocytosis. We will continue outpatient management. He will continue doxycycline to complete 10 days. I am adding keflex 500 mg 4 times daily x7 days for additional coverage.   He knows to call if the appearance of his leg worsens or fails to improve on therapy, or he develops  fever, shaking chills, weakness, or other concerning signs of sepsis. We are postponing his next cycle of Capox to 2 weeks from now.   F/u in 2 weeks with next chemo, or sooner if needed. The plan was reviewed with Dr. Burr Medico.   All questions were answered. The patient knows to call the clinic with any problems, questions or concerns. No barriers to learning were detected.     [name], NP 12/12/19

## 2019-12-14 LAB — CULTURE, BLOOD (ROUTINE X 2)
Culture: NO GROWTH
Culture: NO GROWTH
Special Requests: ADEQUATE

## 2019-12-18 ENCOUNTER — Ambulatory Visit: Payer: Medicare Other | Admitting: Hematology

## 2019-12-18 ENCOUNTER — Other Ambulatory Visit: Payer: Medicare Other

## 2019-12-18 ENCOUNTER — Ambulatory Visit: Payer: Medicare Other

## 2019-12-18 ENCOUNTER — Inpatient Hospital Stay: Payer: Medicare Other | Admitting: Nutrition

## 2019-12-25 NOTE — Progress Notes (Signed)
[place]   [phone]) [phone] Fax:([phone]) [phone]   Clinic Follow up Note   Patient Care Team: [name], MD as PCP - General (Family Medicine) Mansouraty, [name], MD as Consulting Physician (Gastroenterology) Zonia Kief, MD (Rehabilitation) 12/26/2019  CHIEF COMPLAINT: f/u esophageal cancer  SUMMARY OF ONCOLOGIC HISTORY: Oncology History Overview Note  Cancer Staging Malignant neoplasm of gastroesophageal junction [facility]) Staging form: Esophagus - Adenocarcinoma, AJCC 8th Edition - Clinical stage from 02/26/2018: Stage IIB (cT2, cN0, cM0, G3) - Signed by [name], NP on 02/26/2018    Malignant neoplasm of gastroesophageal junction ([place])  01/22/2018 Initial Biopsy   Diagnosis Surgical [P], GE junction nodule BX - POORLY 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   01/22/2018 Procedure   COLONOSCOPY IMPRESSION - Redundant colon. - The examination was otherwise normal on direct and retroflexion views. - No specimens collected.  UPPER ENDOSCOPY IMPRESSION: - Esophageal mucosal changes secondary to established short-segment Barrett's disease. - Mucosal nodule found in the esophagus. Biopsied. - Erythematous mucosa in the prepyloric region of the stomach. - Submucosal nodule found in the duodenum.    02/01/2018 Initial Diagnosis   Malignant neoplasm of gastroesophageal junction ([place])   02/05/2018 Imaging   CT CAP IMPRESSION: 1. No esophageal primary identified. No findings of metastatic disease in the chest, abdomen, or pelvis. 2.  Aortic Atherosclerosis (ICD10-I70.0).   02/12/2018 Procedure   EUS Impression: - A mass was found in the distal esophagus into the gastroesophageal junction and extending to proximal cardia. A tissue diagnosis was obtained prior to this exam consistent with adenocarcinoma. This was staged at T2N0Mx, because of a loss of interface of the muscularis propria  as the distal GE Junction becomes the cardia and the mass moves into this region, but there are many more regions and images that suggest a T1 lesion into the submucosa.   05/29/2018 Imaging   NM PET Image Initial (PI) Skull Base To Thigh  IMPRESSION: 1. Hypermetabolic focus at the GE junction consistent with know adenocarcinoma. 2. No evidence of metastatic adenopathy in the mediastinum or upper abdomen. 3. No evidence of liver metastasis.  No pulmonary metastasis.   06/06/2018 -  Chemotherapy   Weeklycarboplatin and Taxol, with concurrent radiationstarting on 06/06/2018-07/10/18   06/06/2018 - 07/16/2018 Radiation Therapy   Concurrent chemoRT with Dr. Harvel Ricks on 06/06/2018-07/16/18   11/06/2018 PET scan   PET 11/06/18  IMPRESSION: 1. No residual hypermetabolic activity at the gastroesophageal junction. 2. No evidence of metastatic disease.   12/22/2018 Procedure   Upper Endoscopy by Dr. Loletha Carrow 12/22/18  IMPRESSION - Esophageal mucosal changes secondary to established short-segment Barrett's disease. - Erythematous, friable (with contact bleeding), nodular mucosa in the esophagus. Biopsied. May be inflammation from radiation. - Erythematous and petechial mucosa in the gastric fundus and gastric body. Appears likely related to radiation. - Multiple fundic gland polyps. - Eroded and nodular mucosa in the antrum. Biopsied. - Submucosal nodule found in the duodenum. Stable in size from prior exams.    12/22/2018 Pathology Results   Diagnosis 12/22/18 1. Surgical [P], random gastric BX - CHRONIC INACTIVE GASTRITIS. - THERE IS NO EVIDENCE OF HELICOBACTER PYLORI, DYSPLASIA, OR MALIGNANCY. - SEE COMMENT. 2. Surgical [P], esophagus, GE junction - GASTROESOPHAGEAL JUNCTION MUCOSA WITH MILD INFLAMMATION CONSISTENT WITH GASTROESOPHAGEAL REFLUX. - THERE IS NO EVIDENCE OF GOBLET CELL METAPLASIA, DYSPLASIA OR MALIGNANCY. - SEE COMMENT.   07/08/2019 Procedure   Upper Endoscopy by Dr. Loletha Carrow  07/08/19  IMPRESSION -  Nodule found in the esophagus. Biopsied to rule out recurrent malignancy vs radiation effect. - Food (residue) in the stomach. - Retained food in the duodenum.   07/08/2019 Pathology Results   Diagnosis Surgical [P], distal esophagus - POORLY DIFFERENTIATED ADENOCARCINOMA WITH SIGNET RING FEATURES   07/25/2019 PET scan   IMPRESSION: 1. Signs of disease recurrence at the gastroesophageal junction with local nodal disease as described.   08/14/2019 -  Chemotherapy   CAPOX q3weeks with oral Xeloda 2032m BID 2 weeks on/1 week off   11/05/2019 PET scan   IMPRESSION: 1. Interval decrease in metabolic activity at the gastroesophageal junction. 2. Interval decrease in size and metabolic activity of gastrohepatic ligament metastatic lymph node. 3. No evidence of new or progressive disease in the chest, abdomen, or pelvis.     CURRENT THERAPY: CAPOX q3weeks with oral Xeloda20029mBID2 weeks on/1 week off starting on 08/14/19, along with dose reduced to 2000 mg in the morning and 1500 mg in the evening from cycle 8  INTERVAL HISTORY: Mr. CoDelaughtereturns for follow-up.  His last cycle of chemotherapy was a month ago, chemo was held 2 weeks ago due to right lower extremity cellulitis.  He has recovered well.  The skin redness has resolved, he still has persistent moderate right lower extremity edema.  No pain or difficulty walking.  He otherwise feels well overall, has been moderate fatigue, able to function well at home.  No significant pain or other new complaints.  Review of system otherwise negative.   MEDICAL HISTORY:  Past Medical History:  Diagnosis Date  . Abdominal hernia   . Anemia   . Asthma   . Barrett's esophagus   . Cancer (HCC)    Esophagel cancer  . Cataract    Bil surgery  . Cough    over 2 weeks  . Fluid retention in legs   . GERD (gastroesophageal reflux disease)   . H/O hiatal hernia   . Hypertension   . [place]is [place]  . Sleep  apnea    don't use c-pap at home/ tried for several years  . SOB (shortness of breath)     SURGICAL HISTORY: Past Surgical History:  Procedure Laterality Date  . BACK SURGERY     5th lumbar  . BALLOON DILATION N/A 10/19/2016   Procedure: BALLOON DILATION;  Surgeon: StLadene ArtistMD;  [facility];  Service: Endoscopy;  Laterality: N/A;  . COLONOSCOPY W/ POLYPECTOMY    . ESOPHAGOGASTRODUODENOSCOPY N/A 10/19/2016   Procedure: ESOPHAGOGASTRODUODENOSCOPY (EGD);  Surgeon: StLadene ArtistMD;  [facility];  Service: Endoscopy;  Laterality: N/A;  . ESOPHAGOGASTRODUODENOSCOPY (EGD) WITH PROPOFOL N/A 02/12/2018   Procedure: ESOPHAGOGASTRODUODENOSCOPY (EGD) WITH PROPOFOL;  Surgeon: MaRush LandmarkaTelford Nab MD;  [facility] Service: Gastroenterology;  Laterality: N/A;  . EUS N/A 02/12/2018   Procedure: UPPER ENDOSCOPIC ULTRASOUND (EUS) RADIAL;  Surgeon: MaRush LandmarkaTelford Nab MD;  [facility] Service: Gastroenterology;  Laterality: N/A;  . EYE SURGERY Bilateral    cataract  . LUMBAR LAMINECTOMY     1980's    I have reviewed the social history and family history with the patient and they are unchanged from previous note.  ALLERGIES:  has No Known Allergies.  MEDICATIONS:  Current Outpatient Medications  Medication Sig Dispense Refill  . ALBUTEROL IN Inhale 2 puffs into the lungs every 4 (four) hours as needed (shortness of breath or wheezing).     . Marland Kitchenmitriptyline (ELAVIL) 50 MG tablet Take  50 mg by mouth at bedtime.    [name] aspirin EC 81 MG tablet Take 81 mg by mouth daily.    [name] atorvastatin (LIPITOR) 10 MG tablet Take 10 mg by mouth at bedtime.     . cephALEXin (KEFLEX) 500 MG capsule Take 1 capsule (500 mg total) by mouth 4 (four) times daily. 28 capsule 0  . doxycycline (VIBRAMYCIN) 100 MG capsule Take 1 capsule (100 mg total) by mouth 2 (two) times daily. 20 capsule 0  . etodolac (LODINE) 200 MG capsule Take 200 mg by mouth every 8  (eight) hours as needed.    . famotidine (PEPCID) 20 MG tablet Take 1 tablet (20 mg total) by mouth 2 (two) times daily. 60 tablet 1  . HYDROcodone-acetaminophen (NORCO) 7.5-325 MG tablet Take 1 tablet by mouth 3 (three) times daily as needed.    [name] lisinopril (ZESTRIL) 20 MG tablet Take 20 mg by mouth daily.    . Multiple Vitamin (MULTIVITAMIN) tablet Take 1 tablet by mouth daily.    . mycophenolate (CELLCEPT) 500 MG tablet Take 2 tablets (1,000 mg total) by mouth 2 (two) times daily. 120 tablet 12  . ondansetron (ZOFRAN) 8 MG tablet Take 1 tablet (8 mg total) by mouth 2 (two) times daily as needed for refractory nausea / vomiting. Start on day 3 after chemotherapy. 30 tablet 1  . prochlorperazine (COMPAZINE) 10 MG tablet Take 1 tablet (10 mg total) by mouth every 6 (six) hours as needed (Nausea or vomiting). 30 tablet 1  . pyridostigmine (MESTINON) 60 MG tablet Take 0.5-1 tablets (30-60 mg total) by mouth 3 (three) times daily. 90 tablet 12  . XELODA 500 MG tablet Take 4 tablets (2,000 mg total) by mouth 2 (two) times daily after a meal. Take for 14 days, then hold for 7 days. Repeat every 21 days. 112 tablet 1   Current Facility-Administered Medications  Medication Dose Route Frequency Provider Last Rate Last Admin  . 0.9 %  sodium chloride infusion  500 mL Intravenous Once Doran Stabler, MD       Facility-Administered Medications Ordered in Other Visits  Medication Dose Route Frequency Provider Last [place]  . oxaliplatin (ELOXATIN) 225 mg in dextrose 5 % 500 mL chemo infusion  85 mg/m2 (Treatment Plan Recorded) Intravenous Once Truitt Merle, MD 273 mL/hr at 12/26/19 0954 225 mg at 12/26/19 0954    PHYSICAL EXAMINATION: ECOG PERFORMANCE STATUS: 1 - Symptomatic but completely ambulatory  Vitals:   12/26/19 0806  BP: 124/68  Pulse: 85  Resp: 18  Temp: 98.7 F (37.1 C)  SpO2: 98%   Filed Weights   12/26/19 0806  Weight: (!) 302 lb 12.8 oz (137.3 kg)    GENERAL:alert, no  distress and comfortable SKIN: skin color, texture, turgor are normal, no rashes or significant lesions EYES: normal, Conjunctiva are pink and non-injected, sclera clear OROPHARYNX:no exudate, no erythema and lips, buccal mucosa, and tongue normal  NECK: supple, thyroid normal size, non-tender, without nodularity LYMPH:  no palpable lymphadenopathy in the cervical, axillary or inguinal LUNGS: clear to auscultation and percussion with normal breathing effort HEART: regular rate & rhythm and no murmurs and no lower extremity edema ABDOMEN:abdomen soft, non-tender and normal bowel sounds Musculoskeletal:no cyanosis of digits and no clubbing  NEURO: alert & oriented x 3 with fluent speech, no focal motor/sensory deficits  LABORATORY DATA:  I have reviewed the data as listed CBC Latest Ref Rng & Units 12/26/2019 12/12/2019 12/10/2019  WBC 4.0 -  10.5 K/uL 5.4 5.8 5.3  Hemoglobin 13.0 - 17.0 g/dL 12.9(L) 12.5(L) 11.9(L)  Hematocrit 39 - 52 % 38.3(L) 37.3(L) 34.5(L)  Platelets 150 - 400 K/uL 152 147(L) 145(L)     CMP Latest Ref Rng & Units 12/26/2019 12/09/2019 11/27/2019  Glucose 70 - 99 mg/dL 110(H) 104(H) 114(H)  BUN 8 - 23 mg/dL _0 Creatinine 0.61 - 1.24 mg/dL 0.95 0.65 0.75  Sodium 135 - 145 mmol/L 136 135 137  Potassium 3.5 - 5.1 mmol/L 4.5 4.2 4.4  Chloride 98 - 111 mmol/L 109 108 110  CO2 22 - 32 mmol/L 21(L) 17(L) 20(L)  Calcium 8.9 - 10.3 mg/dL 10.8(H) 10.0 11.3(H)  Total Protein 6.5 - 8.1 g/dL 5.9(L) - 6.1(L)  Total Bilirubin 0.3 - 1.2 mg/dL 0.7 - 0.7  Alkaline Phos 38 - 126 U/L 91 - 88  AST 15 - 41 U/L 29 - 32  ALT 0 - 44 U/L 22 - 29      RADIOGRAPHIC STUDIES: I have personally reviewed the radiological images as listed and agreed with the findings in the report. No results found.   ASSESSMENT & PLAN: 74 yo male  1. 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 -Due to his age and comorbidities, hewas felt to be a poorcandidate for surgery. Hewas previously seen by thoracic surgery Dr. Pia Mau. -He completed concurrent chemoRT with CT.His post-treatment PET and endoscopy in 12/2018 showed no residual disease. -His Upper endoscopy by Dr. Loletha Carrow from 07/08/19 showed nodule at distal esophagus/GEJ. Hisbiopsyshows poorly differentiated adenocarcinoma, appears early stage cancer recurrence. This could be residual disease or local recurrence, he is currently asymptomatic. -His PET from 3/25/21showedregionalnode metastasis but nodistant metastasis. Overall only local disease.  -His [facility] Onetest showed PDL-1 10%+,mutation burden 10, HER-2 mutation(+), no other targeted therapy. Due to his history ofmyasthenia gravis,he is not a candidate for immunotherapy.  -I started him on CAPOX q3weeks with oral Xeloda2085m BID2 weeks on/1 week off on 08/14/19. He declined PAC placement.  -His PET from 11/05/19 showed good partial response -Chemo held for 2 weeks after cycle 7 due to cellulitis -He has recovered well, lab reviewed, adequate for treatment, will proceed with cycle 8 CAPOX today, due to his fatigue, will reduce Xeloda to 2000 mg in the morning, 1500 mg in the evening -f/u in 3 weeks   2. RLE cellulitis -Resolved now after prolonged course of antibiotics -He still has residual right lower extremity edema, previously Doppler was negative for DVT -I encouraged him to use compression stocks, and elevating his legs  3Chronic back pain  -Continue toF/uwith pain specialist -Ipreviouslysentclearance note tohis orthopedicsurgeon, [facility]. -Continue hydrocodone, use 4 tablets a day  4. HTN -F/u with PCP. Has been normal lately.  5.Myasthenia  gravis -Oncellcept 10032mIDfor longer term immunosuppression and Mestinon.  -He is at high risk for infections due topriorchemo.  -Stablewith fatigue. He will f/u with neurologist.   6. Hypercalcemia,primary hyperparathyroidism -He takes Vitamin D. He is fine to take multivitamin that contains low dose calcium.I advised him to avoid high dose calcium. -Labshavesupportedprimary hyperparathyroidism.F/u withPCP about this.   7.Goal of care discussion  -The patient understands the goal of care is palliative. -He is full code now -[place]  [place], adequate for treatment, will proceed to cycle 8 CAPOX today, and reduce Xeloda to 2000 mg in the morning, 1500 mg in the evening, for 14 days -Return in 3 weeks for next cycle chemo  All questions were answered. The patient knows to call the clinic with any problems, questions or concerns. No barriers to learning was detected. I spent 25 minutes counseling the patient face to face. The total time spent in the appointment was 30 minutes and more than 50% was on counseling and review of test results     Truitt Merle, MD 12/26/19

## 2019-12-26 ENCOUNTER — Inpatient Hospital Stay: Payer: Medicare Other

## 2019-12-26 ENCOUNTER — Inpatient Hospital Stay (HOSPITAL_BASED_OUTPATIENT_CLINIC_OR_DEPARTMENT_OTHER): Payer: Medicare Other | Admitting: Hematology

## 2019-12-26 ENCOUNTER — Other Ambulatory Visit: Payer: Self-pay

## 2019-12-26 ENCOUNTER — Inpatient Hospital Stay: Payer: Medicare Other | Admitting: Nutrition

## 2019-12-26 ENCOUNTER — Telehealth: Payer: Self-pay

## 2019-12-26 ENCOUNTER — Encounter: Payer: Self-pay | Admitting: Hematology

## 2019-12-26 VITALS — BP 124/68 | HR 85 | Temp 98.7°F | Resp 18 | Ht 72.0 in | Wt 302.8 lb

## 2019-12-26 DIAGNOSIS — C160 Malignant neoplasm of cardia: Secondary | ICD-10-CM

## 2019-12-26 DIAGNOSIS — Z5111 Encounter for antineoplastic chemotherapy: Secondary | ICD-10-CM | POA: Diagnosis not present

## 2019-12-26 LAB — CBC WITH DIFFERENTIAL (CANCER CENTER ONLY)
Abs Immature Granulocytes: 0.02 10*3/uL (ref 0.00–0.07)
Basophils Absolute: 0.1 10*3/uL (ref 0.0–0.1)
Basophils Relative: 2 %
Eosinophils Absolute: 0.2 10*3/uL (ref 0.0–0.5)
Eosinophils Relative: 4 %
HCT: 38.3 % — ABNORMAL LOW (ref 39.0–52.0)
Hemoglobin: 12.9 g/dL — ABNORMAL LOW (ref 13.0–17.0)
Immature Granulocytes: 0 %
Lymphocytes Relative: 17 %
Lymphs Abs: 0.9 10*3/uL (ref 0.7–4.0)
MCH: 37.3 pg — ABNORMAL HIGH (ref 26.0–34.0)
MCHC: 33.7 g/dL (ref 30.0–36.0)
MCV: 110.7 fL — ABNORMAL HIGH (ref 80.0–100.0)
Monocytes Absolute: 1 10*3/uL (ref 0.1–1.0)
Monocytes Relative: 19 %
Neutro Abs: 3.2 10*3/uL (ref 1.7–7.7)
Neutrophils Relative %: 58 %
Platelet Count: 152 10*3/uL (ref 150–400)
RBC: 3.46 MIL/uL — ABNORMAL LOW (ref 4.22–5.81)
RDW: 14.7 % (ref 11.5–15.5)
WBC Count: 5.4 10*3/uL (ref 4.0–10.5)
nRBC: 0 % (ref 0.0–0.2)

## 2019-12-26 LAB — CMP (CANCER CENTER ONLY)
ALT: 22 U/L (ref 0–44)
AST: 29 U/L (ref 15–41)
Albumin: 3.3 g/dL — ABNORMAL LOW (ref 3.5–5.0)
Alkaline Phosphatase: 91 U/L (ref 38–126)
Anion gap: 6 (ref 5–15)
BUN: 17 mg/dL (ref 8–23)
CO2: 21 mmol/L — ABNORMAL LOW (ref 22–32)
Calcium: 10.8 mg/dL — ABNORMAL HIGH (ref 8.9–10.3)
Chloride: 109 mmol/L (ref 98–111)
Creatinine: 0.95 mg/dL (ref 0.61–1.24)
GFR, Est AFR Am: >60 mL/min (ref >60)
GFR, Estimated: >60 mL/min (ref >60)
Glucose, Bld: 110 mg/dL — ABNORMAL HIGH (ref 70–99)
Potassium: 4.5 mmol/L (ref 3.5–5.1)
Sodium: 136 mmol/L (ref 135–145)
Total Bilirubin: 0.7 mg/dL (ref 0.3–1.2)
Total Protein: 5.9 g/dL — ABNORMAL LOW (ref 6.5–8.1)

## 2019-12-26 LAB — CEA (IN HOUSE-CHCC): CEA (CHCC-In House): 4.34 ng/mL (ref 0.00–5.00)

## 2019-12-26 MED ORDER — PALONOSETRON HCL INJECTION 0.25 MG/5ML
INTRAVENOUS | Status: AC
  Filled 2019-12-26: qty 5

## 2019-12-26 MED ORDER — OXALIPLATIN CHEMO INJECTION 100 MG/20ML
85.0000 mg/m2 | Freq: Once | INTRAVENOUS | Status: AC
  Administered 2019-12-26: 225 mg via INTRAVENOUS
  Filled 2019-12-26: qty 40

## 2019-12-26 MED ORDER — DEXTROSE 5 % IV SOLN
Freq: Once | INTRAVENOUS | Status: AC
  Filled 2019-12-26: qty 250

## 2019-12-26 MED ORDER — SODIUM CHLORIDE 0.9 % IV SOLN
150.0000 mg | Freq: Once | INTRAVENOUS | Status: AC
  Administered 2019-12-26: 150 mg via INTRAVENOUS
  Filled 2019-12-26: qty 150

## 2019-12-26 MED ORDER — DEXAMETHASONE 4 MG PO TABS
2.0000 mg | ORAL_TABLET | Freq: Once | ORAL | Status: AC
  Administered 2019-12-26: 2 mg via ORAL

## 2019-12-26 MED ORDER — DEXAMETHASONE 4 MG PO TABS
ORAL_TABLET | ORAL | Status: AC
  Filled 2019-12-26: qty 1

## 2019-12-26 MED ORDER — PALONOSETRON HCL INJECTION 0.25 MG/5ML
0.2500 mg | Freq: Once | INTRAVENOUS | Status: AC
  Administered 2019-12-26: 0.25 mg via INTRAVENOUS

## 2019-12-26 NOTE — Patient Instructions (Signed)
[facility] Discharge Instructions for Patients Receiving Chemotherapy  Today you received the following chemotherapy agents: oxaliplatin.  To help prevent nausea and vomiting after your treatment, we encourage you to take your nausea medication as directed.   If you develop nausea and vomiting that is not controlled by your nausea medication, call the clinic.   BELOW ARE SYMPTOMS THAT SHOULD BE REPORTED IMMEDIATELY:  *FEVER GREATER THAN 100.5 F  *CHILLS WITH OR WITHOUT FEVER  NAUSEA AND VOMITING THAT IS NOT CONTROLLED WITH YOUR NAUSEA MEDICATION  *UNUSUAL SHORTNESS OF BREATH  *UNUSUAL BRUISING OR BLEEDING  TENDERNESS IN MOUTH AND THROAT WITH OR WITHOUT PRESENCE OF ULCERS  *URINARY PROBLEMS  *BOWEL PROBLEMS  UNUSUAL RASH Items with * indicate a potential emergency and should be followed up as soon as possible.  Feel free to call the clinic should you have any questions or concerns. The clinic phone number is [phone].  Please show the CHEMO ALERT CARD at check-in to the Emergency Department and triage nurse.   

## 2019-12-26 NOTE — Progress Notes (Signed)
Nutrition follow-up completed with patient during infusion for cancer of the GE junction. Weight increased and documented as 302.8 pounds on August 26.  This is increased from 295.2 pounds July 28. Noted glucose 110. Patient denies nutrition impact symptoms.  Nutrition diagnosis of food and nutrition related knowledge deficit has resolved.  No nutrition interventions at this time.  Patient has my contact information for questions or concerns.  I will monitor for a change in status however please refer back to RD is nutrition interventions are warranted.  **Disclaimer: This note was dictated with voice recognition software. Similar sounding words can inadvertently be transcribed and this note may contain transcription errors which may not have been corrected upon publication of note.**

## 2019-12-26 NOTE — Telephone Encounter (Signed)
Today's lab results and ov note faxed to Dr. Olin Hauser at [phone].  With note to see elevated calcium level.

## 2019-12-27 ENCOUNTER — Telehealth: Payer: Self-pay | Admitting: Hematology

## 2019-12-27 NOTE — Telephone Encounter (Signed)
Pt is already scheduled in 3 weeks per 8/26 los

## 2020-01-08 ENCOUNTER — Other Ambulatory Visit: Payer: Medicare Other

## 2020-01-08 ENCOUNTER — Ambulatory Visit: Payer: Medicare Other

## 2020-01-08 ENCOUNTER — Ambulatory Visit: Payer: Medicare Other | Admitting: Nurse Practitioner

## 2020-01-13 NOTE — Progress Notes (Signed)
[name]   [phone]) [phone] [phone]) [phone]   Clinic Follow up Note   Patient Care Team: [name], MD as PCP - General (Family Medicine) Mansouraty, [name], MD as Consulting Physician (Gastroenterology) Zonia Kief, MD (Rehabilitation)  Date of Service:  01/16/2020  CHIEF COMPLAINT: f/u esophageal cancer  SUMMARY OF ONCOLOGIC HISTORY: Oncology History Overview Note  Cancer Staging Malignant neoplasm of gastroesophageal junction [facility]) Staging form: Esophagus - Adenocarcinoma, AJCC 8th Edition - Clinical stage from 02/26/2018: Stage IIB (cT2, cN0, cM0, G3) - Signed by [name], NP on 02/26/2018    Malignant neoplasm of gastroesophageal junction ([name])  01/22/2018 Initial Biopsy   Diagnosis Surgical [P], GE junction nodule BX - POORLY 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   01/22/2018 Procedure   COLONOSCOPY IMPRESSION - Redundant colon. - The examination was otherwise normal on direct and retroflexion views. - No specimens collected.  UPPER ENDOSCOPY IMPRESSION: - Esophageal mucosal changes secondary to established short-segment Barrett's disease. - Mucosal nodule found in the esophagus. Biopsied. - Erythematous mucosa in the prepyloric region of the stomach. - Submucosal nodule found in the duodenum.    02/01/2018 Initial Diagnosis   Malignant neoplasm of gastroesophageal junction ([place])   02/05/2018 Imaging   CT CAP IMPRESSION: 1. No esophageal primary identified. No findings of metastatic disease in the chest, abdomen, or pelvis. 2.  Aortic Atherosclerosis (ICD10-I70.0).   02/12/2018 Procedure   EUS Impression: - A mass was found in the distal esophagus into the gastroesophageal junction and extending to proximal cardia. A tissue diagnosis was obtained prior to this exam consistent with adenocarcinoma. This was staged at T2N0Mx, because of a loss of interface of the  muscularis propria as the distal GE Junction becomes the cardia and the mass moves into this region, but there are many more regions and images that suggest a T1 lesion into the submucosa.   05/29/2018 Imaging   NM PET Image Initial (PI) Skull Base To Thigh  IMPRESSION: 1. Hypermetabolic focus at the GE junction consistent with know adenocarcinoma. 2. No evidence of metastatic adenopathy in the mediastinum or upper abdomen. 3. No evidence of liver metastasis.  No pulmonary metastasis.   06/06/2018 -  Chemotherapy   Weeklycarboplatin and Taxol, with concurrent radiationstarting on 06/06/2018-07/10/18   06/06/2018 - 07/16/2018 Radiation Therapy   Concurrent chemoRT with Dr. Harvel Ricks on 06/06/2018-07/16/18   11/06/2018 PET scan   PET 11/06/18  IMPRESSION: 1. No residual hypermetabolic activity at the gastroesophageal junction. 2. No evidence of metastatic disease.   12/22/2018 Procedure   Upper Endoscopy by Dr. Loletha Carrow 12/22/18  IMPRESSION - Esophageal mucosal changes secondary to established short-segment Barrett's disease. - Erythematous, friable (with contact bleeding), nodular mucosa in the esophagus. Biopsied. May be inflammation from radiation. - Erythematous and petechial mucosa in the gastric fundus and gastric body. Appears likely related to radiation. - Multiple fundic gland polyps. - Eroded and nodular mucosa in the antrum. Biopsied. - Submucosal nodule found in the duodenum. Stable in size from prior exams.    12/22/2018 Pathology Results   Diagnosis 12/22/18 1. Surgical [P], random gastric BX - CHRONIC INACTIVE GASTRITIS. - THERE IS NO EVIDENCE OF HELICOBACTER PYLORI, DYSPLASIA, OR MALIGNANCY. - SEE COMMENT. 2. Surgical [P], esophagus, GE junction - GASTROESOPHAGEAL JUNCTION MUCOSA WITH MILD INFLAMMATION CONSISTENT WITH GASTROESOPHAGEAL REFLUX. - THERE IS NO EVIDENCE OF GOBLET CELL METAPLASIA, DYSPLASIA OR MALIGNANCY. - SEE COMMENT.   07/08/2019 Procedure   Upper Endoscopy  by  Dr. Loletha Carrow 07/08/19  IMPRESSION - Nodule found in the esophagus. Biopsied to rule out recurrent malignancy vs radiation effect. - Food (residue) in the stomach. - Retained food in the duodenum.   07/08/2019 Pathology Results   Diagnosis Surgical [P], distal esophagus - POORLY DIFFERENTIATED ADENOCARCINOMA WITH SIGNET RING FEATURES   07/25/2019 PET scan   IMPRESSION: 1. Signs of disease recurrence at the gastroesophageal junction with local nodal disease as described.   08/14/2019 -  Chemotherapy   CAPOX q3weeks with oral Xeloda 2052m BID 2 weeks on/1 week off   11/05/2019 PET scan   IMPRESSION: 1. Interval decrease in metabolic activity at the gastroesophageal junction. 2. Interval decrease in size and metabolic activity of gastrohepatic ligament metastatic lymph node. 3. No evidence of new or progressive disease in the chest, abdomen, or pelvis.      CURRENT THERAPY:  CAPOX q3weeks with oral Xeloda20011mBID2 weeks on/1 week offstarting on4/14/21, along with last cycle, but he did not feel any difference.  Reduced to 2000 mg in the morning and 1500 mg in the evening from cycle 8  INTERVAL HISTORY:  [name] USERYs here for a follow up. He presents to the clinic alone. Mild tingling and numberness on finger and toes for past 4-5 months, not worse lately.  I decreased his Xeloda dose last cycle, but he did not feel any difference.  He still has mild to moderate fatigue, able to function at home, but diffuse is getting harder to do things.  He denies any fever, productive cough, chills, leg swollen, or other new symptoms.  He lost about 3 pounds since last visit.  Review of system otherwise negative.  MEDICAL HISTORY:  Past Medical History:  Diagnosis Date  . Abdominal hernia   . Anemia   . Asthma   . Barrett's esophagus   . Cancer (HCC)    Esophagel cancer  . Cataract    Bil surgery  . Cough    over 2 weeks  . Fluid retention in legs   . GERD (gastroesophageal  reflux disease)   . H/O hiatal hernia   . Hypertension   . My[place]ia gravis ([place]  . Sleep apnea    don't use c-pap at home/ tried for several years  . SOB (shortness of breath)     SURGICAL HISTORY: Past Surgical History:  Procedure Laterality Date  . BACK SURGERY     5th lumbar  . BALLOON DILATION N/A 10/19/2016   Procedure: BALLOON DILATION;  Surgeon: StLadene ArtistMD;  [facility];  Service: Endoscopy;  Laterality: N/A;  . COLONOSCOPY W/ POLYPECTOMY    . ESOPHAGOGASTRODUODENOSCOPY N/A 10/19/2016   Procedure: ESOPHAGOGASTRODUODENOSCOPY (EGD);  Surgeon: StLadene ArtistMD;  [facility];  Service: Endoscopy;  Laterality: N/A;  . ESOPHAGOGASTRODUODENOSCOPY (EGD) WITH PROPOFOL N/A 02/12/2018   Procedure: ESOPHAGOGASTRODUODENOSCOPY (EGD) WITH PROPOFOL;  Surgeon: MaRush LandmarkaTelford Nab MD;  [facility] Service: Gastroenterology;  Laterality: N/A;  . EUS N/A 02/12/2018   Procedure: UPPER ENDOSCOPIC ULTRASOUND (EUS) RADIAL;  Surgeon: MaRush LandmarkaTelford Nab MD;  [facility] Service: Gastroenterology;  Laterality: N/A;  . EYE SURGERY Bilateral    cataract  . LUMBAR LAMINECTOMY     1980's    I have reviewed the social history and family history with the patient and they are unchanged from previous note.  ALLERGIES:  has No Known Allergies.  MEDICATIONS:  Current Outpatient Medications  Medication Sig Dispense Refill  . ALBUTEROL IN Inhale  2 puffs into the lungs every 4 (four) hours as needed (shortness of breath or wheezing).     [name] amitriptyline (ELAVIL) 50 MG tablet Take 50 mg by mouth at bedtime.    [name] aspirin EC 81 MG tablet Take 81 mg by mouth daily.    [name] atorvastatin (LIPITOR) 10 MG tablet Take 10 mg by mouth at bedtime.     . cephALEXin (KEFLEX) 500 MG capsule Take 1 capsule (500 mg total) by mouth 4 (four) times daily. 28 capsule 0  . doxycycline (VIBRAMYCIN) 100 MG capsule Take 1 capsule (100 mg total) by mouth 2 (two)  times daily. 20 capsule 0  . etodolac (LODINE) 200 MG capsule Take 200 mg by mouth every 8 (eight) hours as needed.    . famotidine (PEPCID) 20 MG tablet Take 1 tablet (20 mg total) by mouth 2 (two) times daily. 60 tablet 1  . furosemide (LASIX) 20 MG tablet Take 20 mg by mouth daily.    [name] HYDROcodone-acetaminophen (NORCO) 7.5-325 MG tablet Take 1 tablet by mouth 4 (four) times daily as needed.     [name] lisinopril (ZESTRIL) 20 MG tablet Take 20 mg by mouth daily.    . Multiple Vitamin (MULTIVITAMIN) tablet Take 1 tablet by mouth daily.    . mycophenolate (CELLCEPT) 500 MG tablet Take 2 tablets (1,000 mg total) by mouth 2 (two) times daily. 120 tablet 12  . ondansetron (ZOFRAN) 8 MG tablet Take 1 tablet (8 mg total) by mouth 2 (two) times daily as needed for refractory nausea / vomiting. Start on day 3 after chemotherapy. 30 tablet 1  . prochlorperazine (COMPAZINE) 10 MG tablet Take 1 tablet (10 mg total) by mouth every 6 (six) hours as needed (Nausea or vomiting). 30 tablet 1  . pyridostigmine (MESTINON) 60 MG tablet Take 0.5-1 tablets (30-60 mg total) by mouth 3 (three) times daily. 90 tablet 12  . XELODA 500 MG tablet Take 3 tablets (1,500 mg total) by mouth 2 (two) times daily after a meal. Take for 14 days, then hold for 7 days. Repeat every 21 days. Pt has about 10 tabs at home 75 tablet 0   Current Facility-Administered Medications  Medication Dose Route Frequency Provider Last Rate Last Admin  . 0.9 %  sodium chloride infusion  500 mL Intravenous Once Nelida Meuse III, MD        PHYSICAL EXAMINATION: ECOG PERFORMANCE STATUS: 2 - Symptomatic, <50% confined to bed  Vitals:   01/16/20 1015  BP: 129/71  Pulse: 81  Resp: 18  Temp: (!) 97.5 F (36.4 C)  SpO2: 96%   Filed Weights   01/16/20 1015  Weight: 299 lb 6.4 oz (135.8 kg)    GENERAL:alert, no distress and comfortable SKIN: skin color, texture, turgor are normal, no rashes or significant lesions EYES: normal, Conjunctiva are  pink and non-injected, sclera clear NECK: supple, thyroid normal size, non-tender, without nodularity LYMPH:  no palpable lymphadenopathy in the cervical, axillary  LUNGS: clear to auscultation and percussion with normal breathing effort HEART: regular rate & rhythm and no murmurs and no lower extremity edema ABDOMEN:abdomen soft, non-tender and normal bowel sounds Musculoskeletal:no cyanosis of digits and no clubbing, no leg edema, mild skin hyperpigmentation in right lower extremity NEURO: alert & oriented x 3 with fluent speech, no focal motor/sensory deficits  LABORATORY DATA:  I have reviewed the data as listed CBC Latest Ref Rng & Units 01/16/2020 12/26/2019 12/12/2019  WBC 4.0 - 10.5 K/uL 5.2 5.4 5.8  Hemoglobin 13.0 - 17.0 g/dL 13.6 12.9(L) 12.5(L)  Hematocrit 39 - 52 % 39.2 38.3(L) 37.3(L)  Platelets 150 - 400 K/uL 88(L) 152 147(L)     CMP Latest Ref Rng & Units 01/16/2020 12/26/2019 12/09/2019  Glucose 70 - 99 mg/dL 107(H) 110(H) 104(H)  BUN 8 - 23 mg/dL _0 Creatinine 0.61 - 1.24 mg/dL 0.73 0.95 0.65  Sodium 135 - 145 mmol/L 135 136 135  Potassium 3.5 - 5.1 mmol/L 4.5 4.5 4.2  Chloride 98 - 111 mmol/L 107 109 108  CO2 22 - 32 mmol/L 22 21(L) 17(L)  Calcium 8.9 - 10.3 mg/dL 10.6(H) 10.8(H) 10.0  Total Protein 6.5 - 8.1 g/dL 6.2(L) 5.9(L) -  Total Bilirubin 0.3 - 1.2 mg/dL 0.8 0.7 -  Alkaline Phos 38 - 126 U/L 102 91 -  AST 15 - 41 U/L 39 29 -  ALT 0 - 44 U/L 40 22 -      RADIOGRAPHIC STUDIES: I have personally reviewed the radiological images as listed and agreed with the findings in the report. No results found.   ASSESSMENT & PLAN:  Philip [name] is a 74 y.o. male with    1. 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 Due to his age and comorbidities, hewas  felt to be a poorcandidate for surgery.  -He completed concurrent chemoRT with CT.His post-treatment PET and endoscopy in 12/2018 showed no residual disease. -Unfortunately he either had residual disease vs local early stage cancer recurrence in 07/08/19. -His [facility] Onetest showed PDL-1 10%+,mutation burden 10, HER-2 mutation(+), no other targeted therapy. Due to his history ofmyasthenia gravis,he is not a candidate for immunotherapy. -I started him onCAPOX q3weeks with oral Xeloda2054m BID2 weeks on/1 week offon4/14/21. Xeloda dose reduced to 2000 mg in the morning and 1500 mg in the evening from cycle 8.  -He still has moderate fatigue, lab reviewed, he developed mild thrombocytopenia with platelet 88,000 today.  Will reduce oxaliplatin dose to 552mm2, and reduce Xeloda to 150050mid for 2 weeks  -Plan to repeat staging scan in 3 weeks.  If scan shows stable disease or further response, will change his treatment to Xeloda maintenance therapy -Follow-up in 3 weeks after scan   2. Chronic back pain  -Continue toF/uwith pain specialist, on Hydrocortisone. Continue to F/u with and orthopedicsurgeon, Dr SauMaia Pettiese is cleared for steroid injections -stable   3.HTN, Myasthenia gravis -Oncellcept 1000m13mfor longer term immunosuppressionand Mestinon. -He will f/u with neurologist and PCP  4. Hypercalcemia,primary hyperparathyroidism -He takes Vitamin D. He is fine to take multivitamin that contains low dose calcium.I advised him to avoid high dose calcium. -Prior labshavesupportedprimary hyperparathyroidism.F/u withPCP about this.  5.Goal of care discussion  -The patient understands the goal of care is palliative. -He is full code now -his POA is his Aunt in [place]  [place] reviewed, platelet count 88,000, will proceed with CAPOX today with reduced oxaliplatin dose to 50 mg/m, Xeloda to 15 mg twice daily for 14 days, I refilled Xeloda for this  cycle, he will start today  -Follow-up in 4 weeks (one extra week for him to recover) with lab, CT chest abdomen pelvis with contrast a few days ago.  If scan shows stable disease or further response, plan to change to Xeloda maintenance therapy from next cycle   No problem-specific Assessment & Plan  notes found for this encounter.   Orders Placed This Encounter  Procedures  . CT Abdomen Pelvis W Contrast    Standing Status:   Future    Standing Expiration Date:   01/15/2021    Order Specific Question:   If indicated for the ordered procedure, I authorize the administration of contrast media per Radiology protocol    Answer:   Yes    Order Specific Question:   Preferred imaging location?    Answer:   [facility]    Order Specific Question:   Release to patient    Answer:   Immediate    Order Specific Question:   Is Oral Contrast requested for this exam?    Answer:   Yes, Per Radiology protocol    Order Specific Question:   Radiology Contrast Protocol - do NOT remove file path    Answer:   [email or web address]  . CT Chest W Contrast    Standing Status:   Future    Standing Expiration Date:   01/15/2021    Order Specific Question:   If indicated for the ordered procedure, I authorize the administration of contrast media per Radiology protocol    Answer:   Yes    Order Specific Question:   Preferred imaging location?    Answer:   [facility]    Order Specific Question:   Radiology Contrast Protocol - do NOT remove file path    Answer:   [email or web address]   All questions were answered. The patient knows to call the clinic with any problems, questions or concerns. No barriers to learning was detected. The total time spent in the appointment was 30 minutes.     Truitt Merle, MD 01/16/2020   I, Joslyn Devon, am acting as scribe for Truitt Merle, MD.   I have reviewed the above documentation for  accuracy and completeness, and I agree with the above.

## 2020-01-15 ENCOUNTER — Other Ambulatory Visit: Payer: Self-pay | Admitting: Nurse Practitioner

## 2020-01-15 DIAGNOSIS — C160 Malignant neoplasm of cardia: Secondary | ICD-10-CM

## 2020-01-16 ENCOUNTER — Other Ambulatory Visit: Payer: Self-pay | Admitting: Hematology

## 2020-01-16 ENCOUNTER — Inpatient Hospital Stay (HOSPITAL_BASED_OUTPATIENT_CLINIC_OR_DEPARTMENT_OTHER): Payer: Medicare Other | Admitting: Hematology

## 2020-01-16 ENCOUNTER — Inpatient Hospital Stay: Payer: Medicare Other | Attending: Nurse Practitioner

## 2020-01-16 ENCOUNTER — Encounter: Payer: Self-pay | Admitting: Hematology

## 2020-01-16 ENCOUNTER — Inpatient Hospital Stay: Payer: Medicare Other

## 2020-01-16 ENCOUNTER — Other Ambulatory Visit: Payer: Self-pay

## 2020-01-16 VITALS — BP 129/71 | HR 81 | Temp 97.5°F | Resp 18 | Ht 72.0 in | Wt 299.4 lb

## 2020-01-16 DIAGNOSIS — D696 Thrombocytopenia, unspecified: Secondary | ICD-10-CM | POA: Insufficient documentation

## 2020-01-16 DIAGNOSIS — R5383 Other fatigue: Secondary | ICD-10-CM | POA: Insufficient documentation

## 2020-01-16 DIAGNOSIS — Z79899 Other long term (current) drug therapy: Secondary | ICD-10-CM | POA: Insufficient documentation

## 2020-01-16 DIAGNOSIS — G8929 Other chronic pain: Secondary | ICD-10-CM | POA: Insufficient documentation

## 2020-01-16 DIAGNOSIS — C160 Malignant neoplasm of cardia: Secondary | ICD-10-CM | POA: Diagnosis present

## 2020-01-16 DIAGNOSIS — E210 Primary hyperparathyroidism: Secondary | ICD-10-CM | POA: Diagnosis not present

## 2020-01-16 DIAGNOSIS — G7000 Myasthenia gravis without (acute) exacerbation: Secondary | ICD-10-CM | POA: Insufficient documentation

## 2020-01-16 DIAGNOSIS — M549 Dorsalgia, unspecified: Secondary | ICD-10-CM | POA: Diagnosis not present

## 2020-01-16 DIAGNOSIS — I10 Essential (primary) hypertension: Secondary | ICD-10-CM | POA: Insufficient documentation

## 2020-01-16 DIAGNOSIS — Z5111 Encounter for antineoplastic chemotherapy: Secondary | ICD-10-CM | POA: Diagnosis present

## 2020-01-16 LAB — CMP (CANCER CENTER ONLY)
ALT: 40 U/L (ref 0–44)
AST: 39 U/L (ref 15–41)
Albumin: 3.5 g/dL (ref 3.5–5.0)
Alkaline Phosphatase: 102 U/L (ref 38–126)
Anion gap: 6 (ref 5–15)
BUN: 14 mg/dL (ref 8–23)
CO2: 22 mmol/L (ref 22–32)
Calcium: 10.6 mg/dL — ABNORMAL HIGH (ref 8.9–10.3)
Chloride: 107 mmol/L (ref 98–111)
Creatinine: 0.73 mg/dL (ref 0.61–1.24)
GFR, Est AFR Am: >60 mL/min (ref >60)
GFR, Estimated: >60 mL/min (ref >60)
Glucose, Bld: 107 mg/dL — ABNORMAL HIGH (ref 70–99)
Potassium: 4.5 mmol/L (ref 3.5–5.1)
Sodium: 135 mmol/L (ref 135–145)
Total Bilirubin: 0.8 mg/dL (ref 0.3–1.2)
Total Protein: 6.2 g/dL — ABNORMAL LOW (ref 6.5–8.1)

## 2020-01-16 LAB — CBC WITH DIFFERENTIAL (CANCER CENTER ONLY)
Abs Immature Granulocytes: 0.03 10*3/uL (ref 0.00–0.07)
Basophils Absolute: 0.1 10*3/uL (ref 0.0–0.1)
Basophils Relative: 2 %
Eosinophils Absolute: 0.2 10*3/uL (ref 0.0–0.5)
Eosinophils Relative: 3 %
HCT: 39.2 % (ref 39.0–52.0)
Hemoglobin: 13.6 g/dL (ref 13.0–17.0)
Immature Granulocytes: 1 %
Lymphocytes Relative: 19 %
Lymphs Abs: 1 10*3/uL (ref 0.7–4.0)
MCH: 38 pg — ABNORMAL HIGH (ref 26.0–34.0)
MCHC: 34.7 g/dL (ref 30.0–36.0)
MCV: 109.5 fL — ABNORMAL HIGH (ref 80.0–100.0)
Monocytes Absolute: 1 10*3/uL (ref 0.1–1.0)
Monocytes Relative: 18 %
Neutro Abs: 3 10*3/uL (ref 1.7–7.7)
Neutrophils Relative %: 57 %
Platelet Count: 88 10*3/uL — ABNORMAL LOW (ref 150–400)
RBC: 3.58 MIL/uL — ABNORMAL LOW (ref 4.22–5.81)
RDW: 15.6 % — ABNORMAL HIGH (ref 11.5–15.5)
WBC Count: 5.2 10*3/uL (ref 4.0–10.5)
nRBC: 0 % (ref 0.0–0.2)

## 2020-01-16 LAB — CEA (IN HOUSE-CHCC): CEA (CHCC-In House): 4.86 ng/mL (ref 0.00–5.00)

## 2020-01-16 MED ORDER — PALONOSETRON HCL INJECTION 0.25 MG/5ML
0.2500 mg | Freq: Once | INTRAVENOUS | Status: AC
  Administered 2020-01-16: 0.25 mg via INTRAVENOUS

## 2020-01-16 MED ORDER — PALONOSETRON HCL INJECTION 0.25 MG/5ML
INTRAVENOUS | Status: AC
  Filled 2020-01-16: qty 5

## 2020-01-16 MED ORDER — SODIUM CHLORIDE 0.9 % IV SOLN
150.0000 mg | Freq: Once | INTRAVENOUS | Status: AC
  Administered 2020-01-16: 150 mg via INTRAVENOUS
  Filled 2020-01-16: qty 150

## 2020-01-16 MED ORDER — XELODA 500 MG PO TABS: 1500.0000 mg | ORAL_TABLET | Freq: Two times a day (BID) | ORAL | 0 refills | Status: DC

## 2020-01-16 MED ORDER — DEXAMETHASONE 4 MG PO TABS
2.0000 mg | ORAL_TABLET | Freq: Once | ORAL | Status: AC
  Administered 2020-01-16: 2 mg via ORAL

## 2020-01-16 MED ORDER — OXALIPLATIN CHEMO INJECTION 100 MG/20ML
50.0000 mg/m2 | Freq: Once | INTRAVENOUS | Status: AC
  Administered 2020-01-16: 135 mg via INTRAVENOUS
  Filled 2020-01-16: qty 10

## 2020-01-16 MED ORDER — DEXAMETHASONE 4 MG PO TABS
ORAL_TABLET | ORAL | Status: AC
  Filled 2020-01-16: qty 1

## 2020-01-16 MED ORDER — DEXTROSE 5 % IV SOLN
Freq: Once | INTRAVENOUS | Status: AC
  Filled 2020-01-16: qty 250

## 2020-01-16 NOTE — Patient Instructions (Signed)
[facility] Discharge Instructions for Patients Receiving Chemotherapy  Today you received the following chemotherapy agents: Oxaliplatin  To help prevent nausea and vomiting after your treatment, we encourage you to take your nausea medication as directed.   If you develop nausea and vomiting that is not controlled by your nausea medication, call the clinic.   BELOW ARE SYMPTOMS THAT SHOULD BE REPORTED IMMEDIATELY:  *FEVER GREATER THAN 100.5 F  *CHILLS WITH OR WITHOUT FEVER  NAUSEA AND VOMITING THAT IS NOT CONTROLLED WITH YOUR NAUSEA MEDICATION  *UNUSUAL SHORTNESS OF BREATH  *UNUSUAL BRUISING OR BLEEDING  TENDERNESS IN MOUTH AND THROAT WITH OR WITHOUT PRESENCE OF ULCERS  *URINARY PROBLEMS  *BOWEL PROBLEMS  UNUSUAL RASH Items with * indicate a potential emergency and should be followed up as soon as possible.  Feel free to call the clinic should you have any questions or concerns. The clinic phone number is [phone].  Please show the CHEMO ALERT CARD at check-in to the Emergency Department and triage nurse.   

## 2020-01-16 NOTE — Progress Notes (Signed)
Okay to treat with platelet 88 per Dr. Burr Medico.

## 2020-01-17 ENCOUNTER — Telehealth: Payer: Self-pay | Admitting: Hematology

## 2020-01-17 NOTE — Telephone Encounter (Signed)
Scheduled appointment per 9/16 los. Patient is aware of upcoming appointments.

## 2020-01-20 MED FILL — XELODA 500 MG TABLET: 500 | 21 days supply | Qty: 75 | Fill #0

## 2020-01-29 ENCOUNTER — Telehealth: Payer: Self-pay | Admitting: Hematology

## 2020-01-29 NOTE — Telephone Encounter (Signed)
rescheduled appts on 10/15 to 10/22. Provider is unalienable that afternoon on 10/15. Pt requested afternoons and appt moved to 10/22. Pt is aware of app time and date.

## 2020-02-03 ENCOUNTER — Other Ambulatory Visit: Payer: Self-pay | Admitting: Hematology

## 2020-02-03 DIAGNOSIS — C160 Malignant neoplasm of cardia: Secondary | ICD-10-CM

## 2020-02-10 ENCOUNTER — Encounter: Payer: Self-pay | Admitting: Diagnostic Neuroimaging

## 2020-02-10 ENCOUNTER — Ambulatory Visit (INDEPENDENT_AMBULATORY_CARE_PROVIDER_SITE_OTHER): Payer: Medicare Other | Admitting: Diagnostic Neuroimaging

## 2020-02-10 ENCOUNTER — Other Ambulatory Visit: Payer: Self-pay

## 2020-02-10 VITALS — BP 133/72 | HR 89 | Ht 72.0 in | Wt 303.6 lb

## 2020-02-10 DIAGNOSIS — C160 Malignant neoplasm of cardia: Secondary | ICD-10-CM

## 2020-02-10 DIAGNOSIS — G7000 Myasthenia gravis without (acute) exacerbation: Secondary | ICD-10-CM | POA: Diagnosis not present

## 2020-02-10 MED ORDER — PYRIDOSTIGMINE BROMIDE 60 MG PO TABS: 30.0000 mg | ORAL_TABLET | Freq: Three times a day (TID) | ORAL | 12 refills | Status: AC

## 2020-02-10 MED ORDER — MYCOPHENOLATE MOFETIL 500 MG PO TABS: 1000.0000 mg | ORAL_TABLET | Freq: Two times a day (BID) | ORAL | 12 refills | Status: AC

## 2020-02-10 NOTE — Progress Notes (Signed)
[facility]  PATIENT: Philip [name] DOB: 1945-09-22  REFERRING CLINICIAN: Delman Cheadle, J HISTORY FROM: patient and chart review REASON FOR VISIT: follow up    HISTORICAL  CHIEF COMPLAINT:  Chief Complaint  Patient presents with  . Myasthenia Gravis    rm 7, 8 month FU "no new concerns"    HISTORY OF PRESENT ILLNESS:   UPDATE (02/10/20, VRP): Since last visit, doing about the same. MG symptoms are stable. Severity is mild. No alleviating or aggravating factors. Tolerating meds.    UPDATE (06/12/19, VRP): Since last visit, doing about the same. Symptoms are stable; mainly generalized fatigue. No focal muscle weakness or double vision. No major shortness of breath. No alleviating or aggravating factors. Tolerating cellcept and mestinon.    UPDATE (12/10/18, VRP): Since last visit, doing well. Symptoms are resolved from MG. No alleviating or aggravating factors. Tolerating meds.    UPDATE (06/11/18, VRP): Since last visit, now dx'd with "1. 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 Burr Medico (oncology). He was not felt to be good candidate for surgery, so he has been treated with chemotherapy (x1) and radiation (x3) and so far doing well except for fatigue and some SOB (mainly on exertion).   Myasthenia gravis is stable. No double vision. Tolerating cellcept. Off mestinon since Aug 2019 (at patient's request to see if still needed).   UPDATE (12/04/17, VRP): Since last visit, doing well. Symptoms are resolved. Tolerating cellcept and mestinon. No more double vision. No generalized fatigue.   UPDATE (03/13/17, VRP): Since last visit, doing well. IVIG helped quite a bit. Tolerating pyridostigmine. No alleviating or aggravating factors. No generalized weakness but notes some non-specific "tired" feeling. No double vision or drooping eyelids.   UPDATE (01/17/17, VRP): Since last visit, doing well on pyridostigmine. It worked the next day. AchR  ab are positive. Some generalized fatigue and weakness are noted and new since last visit. Went to ER for laryngospasm (?upper resp infx) and tx'd with 4 days prednisone and oral lidocaine.    PRIOR HPI (12/21/16): 74 year old male here for evaluation of double vision. Her past to 3 weeks patient has had intermittent double vision.  Symptoms present with both eyes open but improve if he closes one eye or uses an eye patch. He describes objects as being up and to the side from each other.  They slightly overlap.  This fluctuates.  Symptoms worse later in the day.  He also had some swallowing difficulties for the past 2 months without a specific cause to be found. He has some generalized fatigue as well. Patient went to [facility] eye clinic, with no specific ocular pathology found.  Patient was referred here for consideration of possible neuromuscular, neuro inflammatory or neuro vascular cause of symptoms. No unilateral numbness or weakness.  No sudden strokelike symptoms.  No headaches.  No prodromal factors.   REVIEW OF SYSTEMS: Full 14 system review of systems performed and negative with exception of: as per HPI.   ALLERGIES: No Known Allergies  HOME MEDICATIONS: Outpatient Medications Prior to Visit  Medication Sig Dispense Refill  . ALBUTEROL IN Inhale 2 puffs into the lungs every 4 (four) hours as needed (shortness of breath or wheezing).     [name] amitriptyline (ELAVIL) 50 MG tablet Take 50 mg by mouth at bedtime.    [name] aspirin EC 81 MG tablet Take 81 mg by mouth daily.    [name] atorvastatin (LIPITOR) 10 MG tablet Take 10 mg by mouth  at bedtime.     [name] etodolac (LODINE) 200 MG capsule Take 200 mg by mouth every 8 (eight) hours as needed.    . famotidine (PEPCID) 20 MG tablet Take 1 tablet (20 mg total) by mouth 2 (two) times daily. 60 tablet 1  . furosemide (LASIX) 20 MG tablet Take 20 mg by mouth daily.    [name] HYDROcodone-acetaminophen (NORCO) 7.5-325 MG tablet Take 1 tablet by mouth 4 (four) times daily  as needed.     [name] lisinopril (ZESTRIL) 20 MG tablet Take 20 mg by mouth daily.    . Multiple Vitamin (MULTIVITAMIN) tablet Take 1 tablet by mouth daily.    . mycophenolate (CELLCEPT) 500 MG tablet Take 2 tablets (1,000 mg total) by mouth 2 (two) times daily. 120 tablet 12  . ondansetron (ZOFRAN) 8 MG tablet Take 1 tablet (8 mg total) by mouth 2 (two) times daily as needed for refractory nausea / vomiting. Start on day 3 after chemotherapy. 30 tablet 1  . prochlorperazine (COMPAZINE) 10 MG tablet Take 1 tablet (10 mg total) by mouth every 6 (six) hours as needed (Nausea or vomiting). 30 tablet 1  . pyridostigmine (MESTINON) 60 MG tablet Take 0.5-1 tablets (30-60 mg total) by mouth 3 (three) times daily. 90 tablet 12  . XELODA 500 MG tablet Take 3 tablets (1,500 mg total) by mouth 2 (two) times daily after a meal. Take for 14 days, then hold for 7 days. Repeat every 21 days. Pt has about 10 tabs at home 75 tablet 0  . cephALEXin (KEFLEX) 500 MG capsule Take 1 capsule (500 mg total) by mouth 4 (four) times daily. 28 capsule 0  . doxycycline (VIBRAMYCIN) 100 MG capsule Take 1 capsule (100 mg total) by mouth 2 (two) times daily. 20 capsule 0   Facility-Administered Medications Prior to Visit  Medication Dose Route Frequency Provider Last Rate Last Admin  . 0.9 %  sodium chloride infusion  500 mL Intravenous Once Doran Stabler, MD        PAST MEDICAL HISTORY: Past Medical History:  Diagnosis Date  . Abdominal hernia   . Anemia   . Asthma   . Barrett's esophagus   . Cancer (HCC)    Esophagel cancer  . Cataract    Bil surgery  . Cough    over 2 weeks  . Fluid retention in legs   . GERD (gastroesophageal reflux disease)   . H/O hiatal hernia   . Hypertension   . Myasthenia gravis ([place])   . Sleep apnea    don't use c-pap at home/ tried for several years  . SOB (shortness of breath)     PAST SURGICAL HISTORY: Past Surgical History:  Procedure Laterality Date  . BACK SURGERY      5th lumbar  . BALLOON DILATION N/A 10/19/2016   Procedure: BALLOON DILATION;  Surgeon: Ladene Artist, MD;  Location: [facility];  Service: Endoscopy;  Laterality: N/A;  . COLONOSCOPY W/ POLYPECTOMY    . ESOPHAGOGASTRODUODENOSCOPY N/A 10/19/2016   Procedure: ESOPHAGOGASTRODUODENOSCOPY (EGD);  Surgeon: Ladene Artist, MD;  Location: [facility];  Service: Endoscopy;  Laterality: N/A;  . ESOPHAGOGASTRODUODENOSCOPY (EGD) WITH PROPOFOL N/A 02/12/2018   Procedure: ESOPHAGOGASTRODUODENOSCOPY (EGD) WITH PROPOFOL;  Surgeon: [name], MD;  Location: [facility];  Service: Gastroenterology;  Laterality: N/A;  . EUS N/A 02/12/2018   Procedure: UPPER ENDOSCOPIC ULTRASOUND (EUS) RADIAL;  Surgeon: [name], MD;  Location: [place];  Service: Gastroenterology;  Laterality: N/A;  .  EYE SURGERY Bilateral    cataract  . LUMBAR LAMINECTOMY     1980's    FAMILY HISTORY: Family History  Problem Relation Age of Onset  . Heart disease Mother   . Lung cancer Father   . Colon cancer Neg Hx   . Esophageal cancer Neg Hx   . Stomach cancer Neg Hx   . Rectal cancer Neg Hx     SOCIAL HISTORY:  Social History   Socioeconomic History  . Marital status: Single    Spouse name: Not on file  . Number of children: 0  . Years of education: 81  . Highest education level: Not on file  Occupational History    Comment: retired  Tobacco Use  . Smoking status: Former Smoker    Packs/day: 1.00    Years: 7.00    Pack years: 7.00    Quit date: 12/21/1976    Years since quitting: 43.1  . Smokeless tobacco: Never Used  Vaping Use  . Vaping Use: Never used  Substance and Sexual Activity  . Alcohol use: Yes    Comment: occ  . Drug use: No  . Sexual activity: Not on file  Other Topics Concern  . Not on file  Social History Narrative   Lives alone   Caffeine- coffee, tea, sodas 1/2 gallon total daily   Social Determinants of Health   Financial Resource Strain:   .  Difficulty of Paying Living Expenses: Not on file  Food Insecurity:   . Worried About Charity fundraiser in the Last Year: Not on file  . Ran Out of Food in the Last Year: Not on file  Transportation Needs:   . Lack of Transportation (Medical): Not on file  . Lack of Transportation (Non-Medical): Not on file  Physical Activity:   . Days of Exercise per Week: Not on file  . Minutes of Exercise per Session: Not on file  Stress:   . Feeling of Stress : Not on file  Social Connections:   . Frequency of Communication with Friends and Family: Not on file  . Frequency of Social Gatherings with Friends and Family: Not on file  . Attends Religious Services: Not on file  . Active Member of Clubs or Organizations: Not on file  . Attends Archivist Meetings: Not on file  . Marital Status: Not on file  Intimate Partner Violence:   . Fear of Current or Ex-Partner: Not on file  . Emotionally Abused: Not on file  . Physically Abused: Not on file  . Sexually Abused: Not on file     PHYSICAL EXAM  GENERAL EXAM/CONSTITUTIONAL: Vitals:  Vitals:   02/10/20 1616  BP: 133/72  Pulse: 89  Weight: (!) 303 lb 9.6 oz (137.7 kg)  Height: 6' (1.829 m)   Wt Readings from Last 10 Encounters:  02/10/20 (!) 303 lb 9.6 oz (137.7 kg)  01/16/20 299 lb 6.4 oz (135.8 kg)  12/26/19 (!) 302 lb 12.8 oz (137.3 kg)  12/12/19 295 lb 11.2 oz (134.1 kg)  12/10/19 296 lb 9.6 oz (134.5 kg)  12/09/19 285 lb (129.3 kg)  11/27/19 (!) 295 lb 3.2 oz (133.9 kg)  11/06/19 295 lb 4.8 oz (133.9 kg)  10/16/19 298 lb 6.4 oz (135.4 kg)  09/25/19 (!) 304 lb 14.4 oz (138.3 kg)    Body mass index is 41.18 kg/m. No exam data present  Patient is in no distress; well developed, nourished and groomed; neck is supple  CARDIOVASCULAR:  Examination  of carotid arteries is normal; no carotid bruits  REGULAR rate and rhythm, no murmurs  Examination of peripheral vascular system by observation and palpation is  normal  EYES:  Ophthalmoscopic exam of optic discs and posterior segments is normal; no papilledema or hemorrhages  MUSCULOSKELETAL:  Gait, strength, tone, movements noted in Neurologic exam below  NEUROLOGIC: MENTAL STATUS:  No flowsheet data found.  awake, alert, oriented to person, place and time  recent and remote memory intact  normal attention and concentration  language fluent, comprehension intact, naming intact,   fund of knowledge appropriate  CRANIAL NERVE:   2nd - no papilledema on fundoscopic exam  2nd, 3rd, 4th, 6th - pupils equal and reactive to light, visual fields full to confrontation, extraocular muscles intact, no nystagmus; MINIMAL LEFT PTOSIS; NO DOUBLE VISION  5th - facial sensation symmetric  7th - facial strength symmetric  8th - hearing intact  9th - palate elevates symmetrically, uvula midline  11th - shoulder shrug symmetric  12th - tongue protrusion midline  HOARSE VOICE  MOTOR:   normal bulk and tone, full strength in the BUE, BLE  SENSORY:   normal and symmetric to light touch, temperature  DECR VIBRATION IN TOES  COORDINATION:   finger-nose-finger, fine finger movements SLOW  REFLEXES:   deep tendon reflexes TRACE and symmetric; ABSENT AT ANKLES  GAIT/STATION:   SMOOTH GAIT    DIAGNOSTIC DATA (LABS, IMAGING, TESTING) - I reviewed patient records, labs, notes, testing and imaging myself where available.  Lab Results  Component Value Date   WBC 5.2 01/16/2020   HGB 13.6 01/16/2020   HCT 39.2 01/16/2020   MCV 109.5 (H) 01/16/2020   PLT 88 (L) 01/16/2020      Component Value Date/Time   NA 135 01/16/2020 0951   NA 138 12/04/2017 1715   K 4.5 01/16/2020 0951   CL 107 01/16/2020 0951   CO2 22 01/16/2020 0951   GLUCOSE 107 (H) 01/16/2020 0951   BUN 14 01/16/2020 0951   BUN 19 12/04/2017 1715   CREATININE 0.73 01/16/2020 0951   CALCIUM 10.6 (H) 01/16/2020 0951   CALCIUM 10.8 (H) 06/28/2018 1347   PROT  6.2 (L) 01/16/2020 0951   PROT 6.0 12/04/2017 1715   ALBUMIN 3.5 01/16/2020 0951   ALBUMIN 4.0 12/04/2017 1715   AST 39 01/16/2020 0951   ALT 40 01/16/2020 0951   ALKPHOS 102 01/16/2020 0951   BILITOT 0.8 01/16/2020 0951   GFRNONAA >60 01/16/2020 0951   GFRAA >60 01/16/2020 0951   No results found for: CHOL, HDL, LDLCALC, LDLDIRECT, TRIG, CHOLHDL Lab Results  Component Value Date   HGBA1C 5.4 12/21/2016   Lab Results  Component Value Date   VITAMINB12 713 07/24/2018   Lab Results  Component Value Date   TSH 2.040 12/21/2016    AChR Binding Ab, Serum  Date Value Ref Range Status  12/21/2016 65.60 (H) 0.00 - 0.24 nmol/L Final    Comment:    **Results verified by repeat testing**                                Negative:   0.00 - 0.24                                Borderline: 0.25 - 0.40  Positive:        > 0.40     01/10/17 MRI brain  - Abnormal MRI scan of the brain showing mild changes of chronic microvascular ischemia and generalized cerebral atrophy. Incidental changes of chronic paranasal sinusitis are noted. 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     ASSESSMENT AND PLAN  74 y.o. year old male here with fatigable, fluctuating, intermittent double vision for past 2-3 weeks, with approximately 2 months of swallowing difficulty.  Also with generalized weakness intermittently.  Has positive AchR antibodies and good response to pyridostigmine and IVIG.  Cellcept --> started 03/13/17.   Dx: myasthenia gravis  1. Myasthenia gravis ([place])   2. 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 FBurr Medico(oncology)  Meds ordered this encounter  Medications  . pyridostigmine (MESTINON) 60 MG tablet    Sig: Take 0.5-1 tablets (30-60 mg total) by mouth 3 (three) times daily.    Dispense:  90 tablet    Refill:  12  . mycophenolate (CELLCEPT) 500 MG tablet    Sig: Take 2 tablets (1,000 mg total) by mouth 2 (two) times daily.    Dispense:  120 tablet    Refill:  12   Return in about 8 months (around 10/10/2020).    VPenni Bombard MD 123/55/7322 40:25PM Certified in Neurology, Neurophysiology and Neuroimaging  [facility] [address] SPleakGTwin Groves [place] [phone]

## 2020-02-11 ENCOUNTER — Inpatient Hospital Stay: Payer: Medicare Other | Attending: Nurse Practitioner

## 2020-02-11 ENCOUNTER — Other Ambulatory Visit: Payer: Self-pay

## 2020-02-11 ENCOUNTER — Ambulatory Visit (HOSPITAL_COMMUNITY)
Admission: RE | Admit: 2020-02-11 | Discharge: 2020-02-11 | Disposition: A | Discharge status: Home / Self Care | Payer: Medicare Other | Source: Ambulatory Visit | Attending: Hematology | Admitting: Hematology

## 2020-02-11 DIAGNOSIS — Z23 Encounter for immunization: Secondary | ICD-10-CM | POA: Insufficient documentation

## 2020-02-11 DIAGNOSIS — G4730 Sleep apnea, unspecified: Secondary | ICD-10-CM | POA: Insufficient documentation

## 2020-02-11 DIAGNOSIS — Z9221 Personal history of antineoplastic chemotherapy: Secondary | ICD-10-CM | POA: Insufficient documentation

## 2020-02-11 DIAGNOSIS — G8929 Other chronic pain: Secondary | ICD-10-CM | POA: Insufficient documentation

## 2020-02-11 DIAGNOSIS — Z923 Personal history of irradiation: Secondary | ICD-10-CM | POA: Diagnosis not present

## 2020-02-11 DIAGNOSIS — E210 Primary hyperparathyroidism: Secondary | ICD-10-CM | POA: Diagnosis not present

## 2020-02-11 DIAGNOSIS — K219 Gastro-esophageal reflux disease without esophagitis: Secondary | ICD-10-CM | POA: Insufficient documentation

## 2020-02-11 DIAGNOSIS — G7000 Myasthenia gravis without (acute) exacerbation: Secondary | ICD-10-CM | POA: Diagnosis not present

## 2020-02-11 DIAGNOSIS — I10 Essential (primary) hypertension: Secondary | ICD-10-CM | POA: Insufficient documentation

## 2020-02-11 DIAGNOSIS — J45909 Unspecified asthma, uncomplicated: Secondary | ICD-10-CM | POA: Insufficient documentation

## 2020-02-11 DIAGNOSIS — C160 Malignant neoplasm of cardia: Secondary | ICD-10-CM | POA: Insufficient documentation

## 2020-02-11 DIAGNOSIS — Z79899 Other long term (current) drug therapy: Secondary | ICD-10-CM | POA: Insufficient documentation

## 2020-02-11 LAB — CBC WITH DIFFERENTIAL (CANCER CENTER ONLY)
Abs Immature Granulocytes: 0.04 10*3/uL (ref 0.00–0.07)
Basophils Absolute: 0.1 10*3/uL (ref 0.0–0.1)
Basophils Relative: 1 %
Eosinophils Absolute: 0.2 10*3/uL (ref 0.0–0.5)
Eosinophils Relative: 3 %
HCT: 38.8 % — ABNORMAL LOW (ref 39.0–52.0)
Hemoglobin: 13.6 g/dL (ref 13.0–17.0)
Immature Granulocytes: 1 %
Lymphocytes Relative: 13 %
Lymphs Abs: 0.9 10*3/uL (ref 0.7–4.0)
MCH: 38.1 pg — ABNORMAL HIGH (ref 26.0–34.0)
MCHC: 35.1 g/dL (ref 30.0–36.0)
MCV: 108.7 fL — ABNORMAL HIGH (ref 80.0–100.0)
Monocytes Absolute: 0.9 10*3/uL (ref 0.1–1.0)
Monocytes Relative: 14 %
Neutro Abs: 4.7 10*3/uL (ref 1.7–7.7)
Neutrophils Relative %: 68 %
Platelet Count: 185 10*3/uL (ref 150–400)
RBC: 3.57 MIL/uL — ABNORMAL LOW (ref 4.22–5.81)
RDW: 15.6 % — ABNORMAL HIGH (ref 11.5–15.5)
WBC Count: 6.9 10*3/uL (ref 4.0–10.5)
nRBC: 0 % (ref 0.0–0.2)

## 2020-02-11 LAB — CMP (CANCER CENTER ONLY)
ALT: 33 U/L (ref 0–44)
AST: 29 U/L (ref 15–41)
Albumin: 3.6 g/dL (ref 3.5–5.0)
Alkaline Phosphatase: 102 U/L (ref 38–126)
Anion gap: 2 — ABNORMAL LOW (ref 5–15)
BUN: 16 mg/dL (ref 8–23)
CO2: 26 mmol/L (ref 22–32)
Calcium: 10.8 mg/dL — ABNORMAL HIGH (ref 8.9–10.3)
Chloride: 105 mmol/L (ref 98–111)
Creatinine: 0.76 mg/dL (ref 0.61–1.24)
GFR, Estimated: >60 mL/min (ref >60)
Glucose, Bld: 114 mg/dL — ABNORMAL HIGH (ref 70–99)
Potassium: 4.6 mmol/L (ref 3.5–5.1)
Sodium: 133 mmol/L — ABNORMAL LOW (ref 135–145)
Total Bilirubin: 1.1 mg/dL (ref 0.3–1.2)
Total Protein: 6.3 g/dL — ABNORMAL LOW (ref 6.5–8.1)

## 2020-02-11 MED ORDER — IOHEXOL 300 MG/ML  SOLN
100.0000 mL | Freq: Once | INTRAMUSCULAR | Status: AC | PRN
  Administered 2020-02-11: 100 mL via INTRAVENOUS

## 2020-02-11 NOTE — Progress Notes (Signed)
Philip [name]   [phone] [phone]   Clinic Follow up Note   Patient Care Team: Philip Anchors, MD as PCP - General (Family Medicine) Philip [name], Philip Nab., MD as Consulting Physician (Gastroenterology) Philip Kief, MD (Rehabilitation)  Date of Service:  02/13/2020  CHIEF COMPLAINT: f/uesophageal cancer  SUMMARY OF ONCOLOGIC HISTORY: Oncology History Overview Note  Cancer Staging Malignant neoplasm of gastroesophageal junction [facility]) Staging form: Esophagus - Adenocarcinoma, AJCC 8th Edition - Clinical stage from 02/26/2018: Stage IIB (cT2, cN0, cM0, G3) - Signed by [name], NP on 02/26/2018    Malignant neoplasm of gastroesophageal junction ([place])  01/22/2018 Initial Biopsy   Diagnosis Surgical [P], GE junction nodule BX - POORLY 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   01/22/2018 Procedure   COLONOSCOPY IMPRESSION - Redundant colon. - The examination was otherwise normal on direct and retroflexion views. - No specimens collected.  UPPER ENDOSCOPY IMPRESSION: - Esophageal mucosal changes secondary to established short-segment Barrett's disease. - Mucosal nodule found in the esophagus. Biopsied. - Erythematous mucosa in the prepyloric region of the stomach. - Submucosal nodule found in the duodenum.    02/01/2018 Initial Diagnosis   Malignant neoplasm of gastroesophageal junction ([place])   02/05/2018 Imaging   CT CAP IMPRESSION: 1. No esophageal primary identified. No findings of metastatic disease in the chest, abdomen, or pelvis. 2.  Aortic Atherosclerosis (ICD10-I70.0).   02/12/2018 Procedure   EUS Impression: - A mass was found in the distal esophagus into the gastroesophageal junction and extending to proximal cardia. A tissue diagnosis was obtained prior to this exam consistent with adenocarcinoma. This was staged at T2N0Mx, because of a loss of interface of  the muscularis propria as the distal GE Junction becomes the cardia and the mass moves into this region, but there are many more regions and images that suggest a T1 lesion into the submucosa.   05/29/2018 Imaging   NM PET Image Initial (PI) Skull Base To Thigh  IMPRESSION: 1. Hypermetabolic focus at the GE junction consistent with know adenocarcinoma. 2. No evidence of metastatic adenopathy in the mediastinum or upper abdomen. 3. No evidence of liver metastasis.  No pulmonary metastasis.   06/06/2018 -  Chemotherapy   Weeklycarboplatin and Taxol, with concurrent radiationstarting on 06/06/2018-07/10/18   06/06/2018 - 07/16/2018 Radiation Therapy   Concurrent chemoRT with Dr. Harvel [name] on 06/06/2018-07/16/18   11/06/2018 PET scan   PET 11/06/18  IMPRESSION: 1. No residual hypermetabolic activity at the gastroesophageal junction. 2. No evidence of metastatic disease.   12/22/2018 Procedure   Upper Endoscopy by Dr. Loletha [name] 12/22/18  IMPRESSION - Esophageal mucosal changes secondary to established short-segment Barrett's disease. - Erythematous, friable (with contact bleeding), nodular mucosa in the esophagus. Biopsied. May be inflammation from radiation. - Erythematous and petechial mucosa in the gastric fundus and gastric body. Appears likely related to radiation. - Multiple fundic gland polyps. - Eroded and nodular mucosa in the antrum. Biopsied. - Submucosal nodule found in the duodenum. Stable in size from prior exams.    12/22/2018 Pathology Results   Diagnosis 12/22/18 1. Surgical [P], random gastric BX - CHRONIC INACTIVE GASTRITIS. - THERE IS NO EVIDENCE OF HELICOBACTER PYLORI, DYSPLASIA, OR MALIGNANCY. - SEE COMMENT. 2. Surgical [P], esophagus, GE junction - GASTROESOPHAGEAL JUNCTION MUCOSA WITH MILD INFLAMMATION CONSISTENT WITH GASTROESOPHAGEAL REFLUX. - THERE IS NO EVIDENCE OF GOBLET CELL METAPLASIA, DYSPLASIA OR MALIGNANCY. - SEE COMMENT.   07/08/2019 Procedure   Upper  Endoscopy by Dr.  Danis 07/08/19  IMPRESSION - Nodule found in the esophagus. Biopsied to rule out recurrent malignancy vs radiation effect. - Food (residue) in the stomach. - Retained food in the duodenum.   07/08/2019 Pathology Results   Diagnosis Surgical [P], distal esophagus - POORLY DIFFERENTIATED ADENOCARCINOMA WITH SIGNET RING FEATURES   07/25/2019 PET scan   IMPRESSION: 1. Signs of disease recurrence at the gastroesophageal junction with local nodal disease as described.   08/14/2019 -  Chemotherapy   CAPOX q3weeks with oral Xeloda 2015m BID 2 weeks on/1 week off   11/05/2019 PET scan   IMPRESSION: 1. Interval decrease in metabolic activity at the gastroesophageal junction. 2. Interval decrease in size and metabolic activity of gastrohepatic ligament metastatic lymph node. 3. No evidence of new or progressive disease in the chest, abdomen, or pelvis.      CURRENT THERAPY:  CAPOX q3weeks with oral Xeloda20043mBID2 weeks on/1 week offstarting on4/14/21,along with last cycle, but he did not feel any difference.  Reduced to 2000 mg in the morning and 1500 mg in the evening from cycle8  INTERVAL HISTORY:  Philip SLABACHs here for a follow up. He presents to the clinic alone. Doing well overall, mild to moderate fatigue, able to do everything at home  No pain (except chronic back pain), no other new complains.  Weight stable  All other systems were reviewed with the patient and are negative.  MEDICAL HISTORY:  Past Medical History:  Diagnosis Date  . Abdominal hernia   . Anemia   . Asthma   . Barrett's esophagus   . Cancer (HCC)    Esophagel cancer  . Cataract    Bil surgery  . Cough    over 2 weeks  . Fluid retention in legs   . GERD (gastroesophageal reflux disease)   . H/O hiatal hernia   . Hypertension   . [place]  . Sleep apnea    don't use c-pap at home/ tried for several years  . SOB (shortness of breath)     SURGICAL  HISTORY: Past Surgical History:  Procedure Laterality Date  . BACK SURGERY     5th lumbar  . BALLOON DILATION N/A 10/19/2016   Procedure: BALLOON DILATION;  Surgeon: StLadene ArtistMD;  [facility];  Service: Endoscopy;  Laterality: N/A;  . COLONOSCOPY W/ POLYPECTOMY    . ESOPHAGOGASTRODUODENOSCOPY N/A 10/19/2016   Procedure: ESOPHAGOGASTRODUODENOSCOPY (EGD);  Surgeon: StLadene ArtistMD;  [facility];  Service: Endoscopy;  Laterality: N/A;  . ESOPHAGOGASTRODUODENOSCOPY (EGD) WITH PROPOFOL N/A 02/12/2018   Procedure: ESOPHAGOGASTRODUODENOSCOPY (EGD) WITH PROPOFOL;  Surgeon: MaRush LandmarkaTelford Nab MD;  [facility] Service: Gastroenterology;  Laterality: N/A;  . EUS N/A 02/12/2018   Procedure: UPPER ENDOSCOPIC ULTRASOUND (EUS) RADIAL;  Surgeon: MaRush LandmarkaTelford Nab MD;  Location: [facility] Service: Gastroenterology;  Laterality: N/A;  . EYE SURGERY Bilateral    cataract  . LUMBAR LAMINECTOMY     1980's    I have reviewed the social history and family history with the patient and they are unchanged from previous note.  ALLERGIES:  has No Known Allergies.  MEDICATIONS:  Current Outpatient Medications  Medication Sig Dispense Refill  . ALBUTEROL IN Inhale 2 puffs into the lungs every 4 (four) hours as needed (shortness of breath or wheezing).     . Marland Kitchenmitriptyline (ELAVIL) 50 MG tablet Take 50 mg by mouth at bedtime.    . Marland Kitchenspirin EC 81 MG tablet Take 81  mg by mouth daily.    [name] atorvastatin (LIPITOR) 10 MG tablet Take 10 mg by mouth at bedtime.     [name] etodolac (LODINE) 200 MG capsule Take 200 mg by mouth every 8 (eight) hours as needed.    . famotidine (PEPCID) 20 MG tablet Take 1 tablet (20 mg total) by mouth 2 (two) times daily. 60 tablet 1  . furosemide (LASIX) 20 MG tablet Take 20 mg by mouth daily.    [name] HYDROcodone-acetaminophen (NORCO) 7.5-325 MG tablet Take 1 tablet by mouth 4 (four) times daily as needed.     [name] lisinopril (ZESTRIL) 20  MG tablet Take 20 mg by mouth daily.    . Multiple Vitamin (MULTIVITAMIN) tablet Take 1 tablet by mouth daily.    . mycophenolate (CELLCEPT) 500 MG tablet Take 2 tablets (1,000 mg total) by mouth 2 (two) times daily. 120 tablet 12  . ondansetron (ZOFRAN) 8 MG tablet Take 1 tablet (8 mg total) by mouth 2 (two) times daily as needed for refractory nausea / vomiting. Start on day 3 after chemotherapy. 30 tablet 1  . prochlorperazine (COMPAZINE) 10 MG tablet Take 1 tablet (10 mg total) by mouth every 6 (six) hours as needed (Nausea or vomiting). 30 tablet 1  . pyridostigmine (MESTINON) 60 MG tablet Take 0.5-1 tablets (30-60 mg total) by mouth 3 (three) times daily. 90 tablet 12  . XELODA 500 MG tablet Take 3 tablets (1,500 mg total) by mouth 2 (two) times daily after a meal. Take for 14 days, then hold for 7 days. Repeat every 21 days. Pt has about 10 tabs at home 75 tablet 0   Current Facility-Administered Medications  Medication Dose Route Frequency Provider Last Rate Last Admin  . 0.9 %  sodium chloride infusion  500 mL Intravenous Once Nelida Meuse III, MD        PHYSICAL EXAMINATION: ECOG PERFORMANCE STATUS: 1 - Symptomatic but completely ambulatory  Vitals:   02/13/20 1425  BP: 140/77  Pulse: 87  Resp: 18  Temp: 98.8 F (37.1 C)  SpO2: 98%   Filed Weights   02/13/20 1425  Weight: 298 lb 14.4 oz (135.6 kg)   GENERAL:alert, no distress and comfortable, obese male  SKIN: skin color, texture, turgor are normal, no rashes or significant lesions EYES: normal, Conjunctiva are pink and non-injected, sclera clear NECK: supple, thyroid normal size, non-tender, without nodularity LYMPH:  no palpable lymphadenopathy in the cervical, axillary  LUNGS: clear to auscultation and percussion with normal breathing effort HEART: regular rate & rhythm and no murmurs and no lower extremity edema ABDOMEN:abdomen soft, non-tender and normal bowel sounds Musculoskeletal:no cyanosis of digits and no  clubbing  NEURO: alert & oriented x 3 with fluent speech, no focal motor/sensory deficits  LABORATORY DATA:  I have reviewed the data as listed CBC Latest Ref Rng & Units 02/11/2020 01/16/2020 12/26/2019  WBC 4.0 - 10.5 K/uL 6.9 5.2 5.4  Hemoglobin 13.0 - 17.0 g/dL 13.6 13.6 12.9(L)  Hematocrit 39 - 52 % 38.8(L) 39.2 38.3(L)  Platelets 150 - 400 K/uL 185 88(L) 152     CMP Latest Ref Rng & Units 02/11/2020 01/16/2020 12/26/2019  Glucose 70 - 99 mg/dL 114(H) 107(H) 110(H)  BUN 8 - 23 mg/dL _0 Creatinine 0.61 - 1.24 mg/dL 0.76 0.73 0.95  Sodium 135 - 145 mmol/L 133(L) 135 136  Potassium 3.5 - 5.1 mmol/L 4.6 4.5 4.5  Chloride 98 - 111 mmol/L 105 107 109  CO2 22 -  32 mmol/L 26 22 21(L)  Calcium 8.9 - 10.3 mg/dL 10.8(H) 10.6(H) 10.8(H)  Total Protein 6.5 - 8.1 g/dL 6.3(L) 6.2(L) 5.9(L)  Total Bilirubin 0.3 - 1.2 mg/dL 1.1 0.8 0.7  Alkaline Phos 38 - 126 U/L 102 102 91  AST 15 - 41 U/L 29 39 29  ALT 0 - 44 U/L 33 40 22      RADIOGRAPHIC STUDIES: I have personally reviewed the radiological images as listed and agreed with the findings in the report. CT Chest W Contrast  Result Date: 02/12/2020 CLINICAL DATA:  Esophageal cancer diagnosed 3 years ago. Radiation therapy completed last year. Ongoing chemotherapy. EXAM: CT CHEST, ABDOMEN, AND PELVIS WITH CONTRAST TECHNIQUE: Multidetector CT imaging of the chest, abdomen and pelvis was performed following the standard protocol during bolus administration of intravenous contrast. CONTRAST:  114m OMNIPAQUE IOHEXOL 300 MG/ML  SOLN COMPARISON:  11/06/2019. Most recent diagnostic CT of 02/05/2018 PET FINDINGS: CT CHEST FINDINGS Cardiovascular: Aortic atherosclerosis. Tortuous thoracic aorta. Mild cardiomegaly with lipomatous hypertrophy of the interatrial septum. No pericardial effusion. No central pulmonary embolism, on this non-dedicated study. Mediastinum/Nodes: No supraclavicular adenopathy. No mediastinal or hilar adenopathy. No esophageal  mass. Lungs/Pleura: No pleural fluid. An inferior right upper lobe 1 mm nodule on 65/4 is favored to represent a calcified granuloma. No suspicious pulmonary nodule or mass. Musculoskeletal: No acute osseous abnormality. CT ABDOMEN PELVIS FINDINGS Hepatobiliary: Mild nonspecific caudate lobe enlargement. No focal liver lesion. Normal gallbladder, without biliary ductal dilatation. Pancreas: Normal, without mass or ductal dilatation. Spleen: Normal in size, without focal abnormality. Adrenals/Urinary Tract: Normal adrenal glands. Normal kidneys, without hydronephrosis. Normal urinary bladder. Stomach/Bowel: Underdistention and possible mild wall thickening at the gastroesophageal junction including on 49/2. Similar to the prior PET. No well-defined mass. A nonobstructive lipoma within the inferior genu of the duodenum measures 12 mm on 73/2. Scattered colonic diverticula. Normal terminal ileum. Vascular/Lymphatic: Aortic atherosclerosis. Gastrohepatic ligament node measures 10 mm on 50/2 versus 11 mm on the prior exam. No pelvic sidewall adenopathy. Reproductive: Normal prostate. Other: No significant free fluid. Small fat containing left inguinal hernia. No evidence of omental or peritoneal disease. Musculoskeletal: Bilateral hip osteoarthritis. Degenerative changes of bilateral sacroiliac joints. Thoracolumbar spondylosis. IMPRESSION: 1. Similar to minimal decrease in size of a borderline sized gastrohepatic ligament node compared to the PET 11/05/2019. 2. No new or progressive disease within the chest abdomen or pelvis. 3.  Aortic Atherosclerosis (ICD10-I70.0). Electronically Signed   By: KAbigail MiyamotoM.D.   On: 02/12/2020 09:30   CT Abdomen Pelvis W Contrast  Result Date: 02/12/2020 CLINICAL DATA:  Esophageal cancer diagnosed 3 years ago. Radiation therapy completed last year. Ongoing chemotherapy. EXAM: CT CHEST, ABDOMEN, AND PELVIS WITH CONTRAST TECHNIQUE: Multidetector CT imaging of the chest, abdomen and  pelvis was performed following the standard protocol during bolus administration of intravenous contrast. CONTRAST:  1050mOMNIPAQUE IOHEXOL 300 MG/ML  SOLN COMPARISON:  11/06/2019. Most recent diagnostic CT of 02/05/2018 PET FINDINGS: CT CHEST FINDINGS Cardiovascular: Aortic atherosclerosis. Tortuous thoracic aorta. Mild cardiomegaly with lipomatous hypertrophy of the interatrial septum. No pericardial effusion. No central pulmonary embolism, on this non-dedicated study. Mediastinum/Nodes: No supraclavicular adenopathy. No mediastinal or hilar adenopathy. No esophageal mass. Lungs/Pleura: No pleural fluid. An inferior right upper lobe 1 mm nodule on 65/4 is favored to represent a calcified granuloma. No suspicious pulmonary nodule or mass. Musculoskeletal: No acute osseous abnormality. CT ABDOMEN PELVIS FINDINGS Hepatobiliary: Mild nonspecific caudate lobe enlargement. No focal liver lesion. Normal gallbladder, without biliary ductal dilatation.  Pancreas: Normal, without mass or ductal dilatation. Spleen: Normal in size, without focal abnormality. Adrenals/Urinary Tract: Normal adrenal glands. Normal kidneys, without hydronephrosis. Normal urinary bladder. Stomach/Bowel: Underdistention and possible mild wall thickening at the gastroesophageal junction including on 49/2. Similar to the prior PET. No well-defined mass. A nonobstructive lipoma within the inferior genu of the duodenum measures 12 mm on 73/2. Scattered colonic diverticula. Normal terminal ileum. Vascular/Lymphatic: Aortic atherosclerosis. Gastrohepatic ligament node measures 10 mm on 50/2 versus 11 mm on the prior exam. No pelvic sidewall adenopathy. Reproductive: Normal prostate. Other: No significant free fluid. Small fat containing left inguinal hernia. No evidence of omental or peritoneal disease. Musculoskeletal: Bilateral hip osteoarthritis. Degenerative changes of bilateral sacroiliac joints. Thoracolumbar spondylosis. IMPRESSION: 1. Similar to  minimal decrease in size of a borderline sized gastrohepatic ligament node compared to the PET 11/05/2019. 2. No new or progressive disease within the chest abdomen or pelvis. 3.  Aortic Atherosclerosis (ICD10-I70.0). Electronically Signed   By: Abigail Miyamoto M.D.   On: 02/12/2020 09:30     ASSESSMENT & PLAN:  Philip [name] is a 74 y.o. male with    1. 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 Due to his age and comorbidities, hewas felt to be a poorcandidate for surgery.  -He completed concurrent chemoRT with CT.His post-treatment PET and endoscopy in 12/2018 showed no residual disease. -Unfortunately he either had residual disease vs local early stage cancer recurrence in 07/08/19. -His [facility] Onetest showed PDL-1 10%+,mutation burden 10, HER-2 mutation(+), no other targeted therapy. Due to his history ofmyasthenia gravis,he is not a candidate for immunotherapy. -I started him onCAPOX q3weeks with oral Xeloda2033m BID2 weeks on/1 week offon4/14/21. Xeloda dose reduced to 2000 mg in the morning and 1500 mg in the evening from cycle8.  -I personally reviewed and discussed his CT CAP from 02/11/20 which showed slightly improved gastrohepatic ligament lymph node, similar mild wall thickening at the GE junction. No other new disease  -I recommend change chemo treatment to Xeloda maintenance therapy, 1500 mg twice daily, 2 weeks on and 1 week off.  He agrees. -Follow-up in 3 weeks   2. Chronic back pain  -Continue toF/uwith pain specialist, on Hydrocortisone. Continue to F/u with and orthopedicsurgeon, Dr SMaia Petties He is cleared for steroid injections -stable   3.HTN, Myasthenia gravis -Oncellcept 10047mIDfor longer term immunosuppressionand Mestinon. -He will f/u  with neurologist and PCP  4. Hypercalcemia,primary hyperparathyroidism -He takes Vitamin D. He is fine to take multivitamin that contains low dose calcium.I advised him to avoid high dose calcium. -Prior labshavesupportedprimary hyperparathyroidism.F/u withPCP about this.  5.Goal of care discussion  -The patient understands the goal of care is palliative. -He is full code now -[place], stable disease -Plan to change treatment to Xeloda maintenance therapy.  Next cycle start on Monday, February 17, 2020, 1500 mg twice daily day 1-14 every 21 days -Follow-up in 3 weeks   No problem-specific Assessment & Plan notes found for this encounter.   No orders of the defined types were placed in this encounter.  All questions were answered. The patient knows to call the clinic with any problems, questions or concerns. No barriers to learning was detected. The total time spent in the appointment was 30 minutes.     YaTruitt MerleMD  02/13/2020   I, Joslyn Devon, am acting as scribe for Truitt Merle, MD.   I have reviewed the above documentation for accuracy and completeness, and I agree with the above.

## 2020-02-13 ENCOUNTER — Telehealth: Payer: Self-pay | Admitting: Hematology

## 2020-02-13 ENCOUNTER — Other Ambulatory Visit: Payer: Self-pay

## 2020-02-13 ENCOUNTER — Inpatient Hospital Stay (HOSPITAL_BASED_OUTPATIENT_CLINIC_OR_DEPARTMENT_OTHER): Payer: Medicare Other | Admitting: Hematology

## 2020-02-13 ENCOUNTER — Encounter: Payer: Self-pay | Admitting: Hematology

## 2020-02-13 ENCOUNTER — Other Ambulatory Visit: Payer: Self-pay | Admitting: Hematology

## 2020-02-13 VITALS — BP 140/77 | HR 87 | Temp 98.8°F | Resp 18 | Ht 72.0 in | Wt 298.9 lb

## 2020-02-13 DIAGNOSIS — Z23 Encounter for immunization: Secondary | ICD-10-CM

## 2020-02-13 DIAGNOSIS — C160 Malignant neoplasm of cardia: Secondary | ICD-10-CM | POA: Diagnosis not present

## 2020-02-13 MED ORDER — XELODA 500 MG PO TABS: 1500.0000 mg | ORAL_TABLET | Freq: Two times a day (BID) | ORAL | 2 refills | Status: DC

## 2020-02-13 MED ORDER — INFLUENZA VAC A&B SA ADJ QUAD 0.5 ML IM PRSY
PREFILLED_SYRINGE | INTRAMUSCULAR | Status: AC
  Filled 2020-02-13: qty 0.5

## 2020-02-13 MED ORDER — INFLUENZA VAC A&B SA ADJ QUAD 0.5 ML IM PRSY
0.5000 mL | PREFILLED_SYRINGE | Freq: Once | INTRAMUSCULAR | Status: AC
  Administered 2020-02-13: 0.5 mL via INTRAMUSCULAR

## 2020-02-13 MED FILL — XELODA 500 MG TABLET: 500 | 21 days supply | Qty: 84 | Fill #0

## 2020-02-13 NOTE — Telephone Encounter (Signed)
Scheduled per 10/14 los. Printed appt calendar for pt.

## 2020-02-14 ENCOUNTER — Ambulatory Visit: Payer: Medicare Other | Admitting: Hematology

## 2020-02-21 ENCOUNTER — Ambulatory Visit: Payer: Medicare Other | Admitting: Hematology

## 2020-03-04 NOTE — Progress Notes (Signed)
Philip [name]   Telephone:([phone]) [phone] Fax:([phone]) [phone]   Clinic Follow up Note   Patient Care Team: [name], MD as PCP - General (Family Medicine) Mansouraty, [name], MD as Consulting Physician (Gastroenterology) Zonia Kief, MD (Rehabilitation)  Date of Service:  03/06/2020  CHIEF COMPLAINT: f/uesophageal cancer  SUMMARY OF ONCOLOGIC HISTORY: Oncology History Overview Note  Cancer Staging Malignant neoplasm of gastroesophageal junction [facility]) Staging form: Esophagus - Adenocarcinoma, AJCC 8th Edition - Clinical stage from 02/26/2018: Stage IIB (cT2, cN0, cM0, G3) - Signed by [name], NP on 02/26/2018    Malignant neoplasm of gastroesophageal junction ([place])  01/22/2018 Initial Biopsy   Diagnosis Surgical [P], GE junction nodule BX - POORLY 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   01/22/2018 Procedure   COLONOSCOPY IMPRESSION - Redundant colon. - The examination was otherwise normal on direct and retroflexion views. - No specimens collected.  UPPER ENDOSCOPY IMPRESSION: - Esophageal mucosal changes secondary to established short-segment Barrett's disease. - Mucosal nodule found in the esophagus. Biopsied. - Erythematous mucosa in the prepyloric region of the stomach. - Submucosal nodule found in the duodenum.    02/01/2018 Initial Diagnosis   Malignant neoplasm of gastroesophageal junction ([place])   02/05/2018 Imaging   CT CAP IMPRESSION: 1. No esophageal primary identified. No findings of metastatic disease in the chest, abdomen, or pelvis. 2.  Aortic Atherosclerosis (ICD10-I70.0).   02/12/2018 Procedure   EUS Impression: - A mass was found in the distal esophagus into the gastroesophageal junction and extending to proximal cardia. A tissue diagnosis was obtained prior to this exam consistent with adenocarcinoma. This was staged at T2N0Mx, because of a loss of interface of the  muscularis propria as the distal GE Junction becomes the cardia and the mass moves into this region, but there are many more regions and images that suggest a T1 lesion into the submucosa.   05/29/2018 Imaging   NM PET Image Initial (PI) Skull Base To Thigh  IMPRESSION: 1. Hypermetabolic focus at the GE junction consistent with know adenocarcinoma. 2. No evidence of metastatic adenopathy in the mediastinum or upper abdomen. 3. No evidence of liver metastasis.  No pulmonary metastasis.   06/06/2018 -  Chemotherapy   Weeklycarboplatin and Taxol, with concurrent radiationstarting on 06/06/2018-07/10/18   06/06/2018 - 07/16/2018 Radiation Therapy   Concurrent chemoRT with Dr. Harvel Ricks on 06/06/2018-07/16/18   11/06/2018 PET scan   PET 11/06/18  IMPRESSION: 1. No residual hypermetabolic activity at the gastroesophageal junction. 2. No evidence of metastatic disease.   12/22/2018 Procedure   Upper Endoscopy by Dr. Loletha Carrow 12/22/18  IMPRESSION - Esophageal mucosal changes secondary to established short-segment Barrett's disease. - Erythematous, friable (with contact bleeding), nodular mucosa in the esophagus. Biopsied. May be inflammation from radiation. - Erythematous and petechial mucosa in the gastric fundus and gastric body. Appears likely related to radiation. - Multiple fundic gland polyps. - Eroded and nodular mucosa in the antrum. Biopsied. - Submucosal nodule found in the duodenum. Stable in size from prior exams.    12/22/2018 Pathology Results   Diagnosis 12/22/18 1. Surgical [P], random gastric BX - CHRONIC INACTIVE GASTRITIS. - THERE IS NO EVIDENCE OF HELICOBACTER PYLORI, DYSPLASIA, OR MALIGNANCY. - SEE COMMENT. 2. Surgical [P], esophagus, GE junction - GASTROESOPHAGEAL JUNCTION MUCOSA WITH MILD INFLAMMATION CONSISTENT WITH GASTROESOPHAGEAL REFLUX. - THERE IS NO EVIDENCE OF GOBLET CELL METAPLASIA, DYSPLASIA OR MALIGNANCY. - SEE COMMENT.   07/08/2019 Procedure   Upper Endoscopy  by Dr.  Danis 07/08/19  IMPRESSION - Nodule found in the esophagus. Biopsied to rule out recurrent malignancy vs radiation effect. - Food (residue) in the stomach. - Retained food in the duodenum.   07/08/2019 Pathology Results   Diagnosis Surgical [P], distal esophagus - POORLY DIFFERENTIATED ADENOCARCINOMA WITH SIGNET RING FEATURES   07/25/2019 PET scan   IMPRESSION: 1. Signs of disease recurrence at the gastroesophageal junction with local nodal disease as described.   08/14/2019 -  Chemotherapy   CAPOX q3weeks with oral Xeloda 2063m BID 2 weeks on/1 week off starting on 08/14/19, along with last cycle, but he did not feel any difference.  Reduced to 2000 mg in the morning and 1500 mg in the evening from cycle 8.                    --Now on  maintenance Xeloda 1500 mg twice daily, 2 weeks on and 1 week off starting 02/22/20   11/05/2019 PET scan   IMPRESSION: 1. Interval decrease in metabolic activity at the gastroesophageal junction. 2. Interval decrease in size and metabolic activity of gastrohepatic ligament metastatic lymph node. 3. No evidence of new or progressive disease in the chest, abdomen, or pelvis.      CURRENT THERAPY:  Now on  maintenance Xeloda 1500 mg twice daily, 2 weeks on and 1 week off starting 02/22/20   INTERVAL HISTORY:  Philip [name] is here for a follow up. He presents to the clinic alone. He notes he is fatigued. He denies pain or swallowing issues.    REVIEW OF SYSTEMS:   Constitutional: Denies fevers, chills or abnormal weight loss Eyes: Denies blurriness of vision Ears, nose, mouth, throat, and face: Denies mucositis or sore throat Respiratory: Denies cough, dyspnea or wheezes Cardiovascular: Denies palpitation, chest discomfort or lower extremity swelling Gastrointestinal:  Denies nausea, heartburn or change in bowel habits Skin: Denies abnormal skin rashes Lymphatics: Denies new lymphadenopathy or easy bruising Neurological:Denies  numbness, tingling or new weaknesses Behavioral/Psych: Mood is stable, no new changes  All other systems were reviewed with the patient and are negative.  MEDICAL HISTORY:  Past Medical History:  Diagnosis Date  . Abdominal hernia   . Anemia   . Asthma   . Barrett's esophagus   . Cancer (HCC)    Esophagel cancer  . Cataract    Bil surgery  . Cough    over 2 weeks  . Fluid retention in legs   . GERD (gastroesophageal reflux disease)   . H/O hiatal hernia   . Hypertension   . [place]   . Sleep apnea    don't use c-pap at home/ tried for several years  . SOB (shortness of breath)     SURGICAL HISTORY: Past Surgical History:  Procedure Laterality Date  . BACK SURGERY     5th lumbar  . BALLOON DILATION N/A 10/19/2016   Procedure: BALLOON DILATION;  Surgeon: SLadene Artist MD;  [facility];  Service: Endoscopy;  Laterality: N/A;  . COLONOSCOPY W/ POLYPECTOMY    . ESOPHAGOGASTRODUODENOSCOPY N/A 10/19/2016   Procedure: ESOPHAGOGASTRODUODENOSCOPY (EGD);  Surgeon: SLadene Artist MD;  [facility];  Service: Endoscopy;  Laterality: N/A;  . ESOPHAGOGASTRODUODENOSCOPY (EGD) WITH PROPOFOL N/A 02/12/2018   Procedure: ESOPHAGOGASTRODUODENOSCOPY (EGD) WITH PROPOFOL;  Surgeon: MRush LandmarkGTelford Nab, MD;  [facility]  Service: Gastroenterology;  Laterality: N/A;  . EUS N/A 02/12/2018   Procedure: UPPER ENDOSCOPIC ULTRASOUND (EUS) RADIAL;  Surgeon: MIrving Copas, MD;  Location[facility];  Service: Gastroenterology;  Laterality: N/A;  . EYE SURGERY Bilateral    cataract  . LUMBAR LAMINECTOMY     1980's    I have reviewed the social history and family history with the patient and they are unchanged from previous note.  ALLERGIES:  has No Known Allergies.  MEDICATIONS:  Current Outpatient Medications  Medication Sig Dispense Refill  . ALBUTEROL IN Inhale 2 puffs into the lungs every 4 (four) hours as needed (shortness  of breath or wheezing).     [name] amitriptyline (ELAVIL) 50 MG tablet Take 50 mg by mouth at bedtime.    [name] aspirin EC 81 MG tablet Take 81 mg by mouth daily.    [name] atorvastatin (LIPITOR) 10 MG tablet Take 10 mg by mouth at bedtime.     [name] etodolac (LODINE) 200 MG capsule Take 200 mg by mouth every 8 (eight) hours as needed.    . famotidine (PEPCID) 20 MG tablet Take 1 tablet (20 mg total) by mouth 2 (two) times daily. 60 tablet 1  . furosemide (LASIX) 20 MG tablet Take 20 mg by mouth daily.    [name] HYDROcodone-acetaminophen (NORCO) 7.5-325 MG tablet Take 1 tablet by mouth 4 (four) times daily as needed.     [name] lisinopril (ZESTRIL) 20 MG tablet Take 20 mg by mouth daily.    . Multiple Vitamin (MULTIVITAMIN) tablet Take 1 tablet by mouth daily.    . mycophenolate (CELLCEPT) 500 MG tablet Take 2 tablets (1,000 mg total) by mouth 2 (two) times daily. 120 tablet 12  . ondansetron (ZOFRAN) 8 MG tablet Take 1 tablet (8 mg total) by mouth 2 (two) times daily as needed for refractory nausea / vomiting. Start on day 3 after chemotherapy. 30 tablet 1  . prochlorperazine (COMPAZINE) 10 MG tablet Take 1 tablet (10 mg total) by mouth every 6 (six) hours as needed (Nausea or vomiting). 30 tablet 1  . pyridostigmine (MESTINON) 60 MG tablet Take 0.5-1 tablets (30-60 mg total) by mouth 3 (three) times daily. 90 tablet 12  . XELODA 500 MG tablet Take 3 tablets (1,500 mg total) by mouth 2 (two) times daily after a meal. Take for 14 days, then hold for 7 days. Repeat every 21 days. 84 tablet 2   Current Facility-Administered Medications  Medication Dose Route Frequency Provider Last Rate Last Admin  . 0.9 %  sodium chloride infusion  500 mL Intravenous Once Nelida Meuse III, MD        PHYSICAL EXAMINATION: ECOG PERFORMANCE STATUS: 2 - Symptomatic, <50% confined to bed  Vitals:   03/06/20 1544  BP: (!) 101/57  Pulse: 98  Resp: 18  Temp: 98.4 F (36.9 C)  SpO2: 97%   Filed Weights   03/06/20 1544  Weight: (!)  300 lb 1.6 oz (136.1 kg)    Due to COVID19 we will limit examination to appearance. Patient had no complaints.  GENERAL:alert, no distress and comfortable SKIN: skin color normal, no rashes or significant lesions EYES: normal, Conjunctiva are pink and non-injected, sclera clear  NEURO: alert & oriented x 3 with fluent speech   LABORATORY DATA:  I have reviewed the data as listed CBC Latest Ref Rng & Units 03/06/2020 02/11/2020 01/16/2020  WBC 4.0 - 10.5 K/uL 7.8 6.9 5.2  Hemoglobin 13.0 - 17.0 g/dL 14.0 13.6 13.6  Hematocrit 39 - 52 % 40.4 38.8(L) 39.2  Platelets 150 - 400 K/uL 220 185 88(L)     CMP Latest Ref Rng &  Units 03/06/2020 02/11/2020 01/16/2020  Glucose 70 - 99 mg/dL 154(H) 114(H) 107(H)  BUN 8 - 23 mg/dL _0 Creatinine 0.61 - 1.24 mg/dL 0.81 0.76 0.73  Sodium 135 - 145 mmol/L 137 133(L) 135  Potassium 3.5 - 5.1 mmol/L 4.2 4.6 4.5  Chloride 98 - 111 mmol/L 106 105 107  CO2 22 - 32 mmol/L _1 Calcium 8.9 - 10.3 mg/dL 11.2(H) 10.8(H) 10.6(H)  Total Protein 6.5 - 8.1 g/dL 6.4(L) 6.3(L) 6.2(L)  Total Bilirubin 0.3 - 1.2 mg/dL 1.2 1.1 0.8  Alkaline Phos 38 - 126 U/L 102 102 102  AST 15 - 41 U/L 24 29 39  ALT 0 - 44 U/L 27 33 40      RADIOGRAPHIC STUDIES: I have personally reviewed the radiological images as listed and agreed with the findings in the report. No results found.   ASSESSMENT & PLAN:  Philip [name] is a 74 y.o. male with    1. 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 Due to his age and comorbidities, hewas felt to be a poorcandidate for surgery.  -He completed concurrent chemoRT with CT.His post-treatment PET and endoscopy in 12/2018 showed no residual disease. -Unfortunately he either had residual disease vs localearly stage cancer  recurrencein 07/08/19. -His [facility] Onetest showed PDL-1 10%+,mutation burden 10, HER-2 mutation(+), no other targeted therapy. Due to his history ofmyasthenia gravis,he is not a candidate for immunotherapy. -I started him onCAPOX q3weeks with oral Xeloda2043m BID2 weeks on/1 week offon4/14/21.He is now on maintenance Xeloda 1500 mg twice daily, 2 weeks on and 1 week off starting 02/22/20.  -He is tolerating well with some fatigue. Labs reviewed and adequate to continue. Plan to start next cycle on 03/14/20 -F/u in 3 weeks   2.Chronic back pain  -Continue toF/uwith pain specialist, on Hydrocortisone. Continue to F/u with andorthopedicsurgeon, Dr SMaia Petties He is cleared for steroid injections -stable  3.HTN,Myasthenia gravis -Oncellcept 10074mIDfor longer term immunosuppressionand Mestinon. -Hewill f/u with neurologist and PCP  4. Hypercalcemia,primary hyperparathyroidism -He takes Vitamin D. He is fine to take multivitamin that contains low dose calcium.I advised him to avoid high dose calcium. -Prior labshavesupportedprimary hyperparathyroidism.F/u withPCP about this.  5.Goal of care discussion  -The patient understands the goal of care is palliative. -He is full code now -his POA is his Aunt in [place]   Plan -Continue maintenance Xeloda 1500 mg twice daily, 2 weeks on and 1 week off. Start next cycle on 03/14/20. -Lab and f/u on 11/29   No problem-specific Assessment & Plan notes found for this encounter.   No orders of the defined types were placed in this encounter.  All questions were answered. The patient knows to call the clinic with any problems, questions or concerns. No barriers to learning was detected. The total time spent in the appointment was 30 minutes.     YaTruitt MerleMD 03/06/2020   I, AmJoslyn Devonam acting as scribe for YaTruitt MerleMD.   I have reviewed the above documentation for accuracy and completeness, and I  agree with the above.

## 2020-03-06 ENCOUNTER — Inpatient Hospital Stay: Payer: Medicare Other | Attending: Nurse Practitioner | Admitting: Hematology

## 2020-03-06 ENCOUNTER — Inpatient Hospital Stay: Payer: Medicare Other

## 2020-03-06 ENCOUNTER — Other Ambulatory Visit: Payer: Self-pay

## 2020-03-06 ENCOUNTER — Telehealth: Payer: Self-pay | Admitting: Hematology

## 2020-03-06 VITALS — BP 101/57 | HR 98 | Temp 98.4°F | Resp 18 | Ht 72.0 in | Wt 300.1 lb

## 2020-03-06 DIAGNOSIS — G7000 Myasthenia gravis without (acute) exacerbation: Secondary | ICD-10-CM | POA: Diagnosis not present

## 2020-03-06 DIAGNOSIS — C160 Malignant neoplasm of cardia: Secondary | ICD-10-CM

## 2020-03-06 DIAGNOSIS — G8929 Other chronic pain: Secondary | ICD-10-CM | POA: Diagnosis not present

## 2020-03-06 DIAGNOSIS — I10 Essential (primary) hypertension: Secondary | ICD-10-CM | POA: Insufficient documentation

## 2020-03-06 DIAGNOSIS — R1084 Generalized abdominal pain: Secondary | ICD-10-CM | POA: Diagnosis not present

## 2020-03-06 DIAGNOSIS — M549 Dorsalgia, unspecified: Secondary | ICD-10-CM | POA: Diagnosis not present

## 2020-03-06 DIAGNOSIS — Z79899 Other long term (current) drug therapy: Secondary | ICD-10-CM | POA: Diagnosis not present

## 2020-03-06 LAB — CMP (CANCER CENTER ONLY)
ALT: 27 U/L (ref 0–44)
AST: 24 U/L (ref 15–41)
Albumin: 3.6 g/dL (ref 3.5–5.0)
Alkaline Phosphatase: 102 U/L (ref 38–126)
Anion gap: 7 (ref 5–15)
BUN: 15 mg/dL (ref 8–23)
CO2: 24 mmol/L (ref 22–32)
Calcium: 11.2 mg/dL — ABNORMAL HIGH (ref 8.9–10.3)
Chloride: 106 mmol/L (ref 98–111)
Creatinine: 0.81 mg/dL (ref 0.61–1.24)
GFR, Estimated: >60 mL/min (ref >60)
Glucose, Bld: 154 mg/dL — ABNORMAL HIGH (ref 70–99)
Potassium: 4.2 mmol/L (ref 3.5–5.1)
Sodium: 137 mmol/L (ref 135–145)
Total Bilirubin: 1.2 mg/dL (ref 0.3–1.2)
Total Protein: 6.4 g/dL — ABNORMAL LOW (ref 6.5–8.1)

## 2020-03-06 LAB — CBC WITH DIFFERENTIAL (CANCER CENTER ONLY)
Abs Immature Granulocytes: 0.04 10*3/uL (ref 0.00–0.07)
Basophils Absolute: 0.1 10*3/uL (ref 0.0–0.1)
Basophils Relative: 1 %
Eosinophils Absolute: 0.1 10*3/uL (ref 0.0–0.5)
Eosinophils Relative: 2 %
HCT: 40.4 % (ref 39.0–52.0)
Hemoglobin: 14 g/dL (ref 13.0–17.0)
Immature Granulocytes: 1 %
Lymphocytes Relative: 11 %
Lymphs Abs: 0.9 10*3/uL (ref 0.7–4.0)
MCH: 37 pg — ABNORMAL HIGH (ref 26.0–34.0)
MCHC: 34.7 g/dL (ref 30.0–36.0)
MCV: 106.9 fL — ABNORMAL HIGH (ref 80.0–100.0)
Monocytes Absolute: 1.2 10*3/uL — ABNORMAL HIGH (ref 0.1–1.0)
Monocytes Relative: 15 %
Neutro Abs: 5.5 10*3/uL (ref 1.7–7.7)
Neutrophils Relative %: 70 %
Platelet Count: 220 10*3/uL (ref 150–400)
RBC: 3.78 MIL/uL — ABNORMAL LOW (ref 4.22–5.81)
RDW: 14.2 % (ref 11.5–15.5)
WBC Count: 7.8 10*3/uL (ref 4.0–10.5)
nRBC: 0 % (ref 0.0–0.2)

## 2020-03-06 NOTE — Telephone Encounter (Signed)
Scheduled appointment per 11/5 los. Spoke to patient who is aware of appointment date and time.  

## 2020-03-07 ENCOUNTER — Encounter: Payer: Self-pay | Admitting: Hematology

## 2020-03-11 MED FILL — XELODA 500 MG TABLET: 500 | 21 days supply | Qty: 84 | Fill #1

## 2020-03-23 NOTE — Progress Notes (Signed)
[place]   Telephone:([phone] Fax:([phone]) [phone]   Clinic Follow up Note   Patient Care Team: [name], MD as PCP - General (Family Medicine) Mansouraty, [name], MD as Consulting Physician (Gastroenterology) Zonia Kief, MD (Rehabilitation)  Date of Service:  03/30/2020  CHIEF COMPLAINT: f/uesophageal cancer  SUMMARY OF ONCOLOGIC HISTORY: Oncology History Overview Note  Cancer Staging Malignant neoplasm of gastroesophageal junction [facility]) Staging form: Esophagus - Adenocarcinoma, AJCC 8th Edition - Clinical stage from 02/26/2018: Stage IIB (cT2, cN0, cM0, G3) - Signed by [name], NP on 02/26/2018    Malignant neoplasm of gastroesophageal junction ([place])  01/22/2018 Initial Biopsy   Diagnosis Surgical [P], GE junction nodule BX - POORLY 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   01/22/2018 Procedure   COLONOSCOPY IMPRESSION - Redundant colon. - The examination was otherwise normal on direct and retroflexion views. - No specimens collected.  UPPER ENDOSCOPY IMPRESSION: - Esophageal mucosal changes secondary to established short-segment Barrett's disease. - Mucosal nodule found in the esophagus. Biopsied. - Erythematous mucosa in the prepyloric region of the stomach. - Submucosal nodule found in the duodenum.    02/01/2018 Initial Diagnosis   Malignant neoplasm of gastroesophageal junction ([place])   02/05/2018 Imaging   CT CAP IMPRESSION: 1. No esophageal primary identified. No findings of metastatic disease in the chest, abdomen, or pelvis. 2.  Aortic Atherosclerosis (ICD10-I70.0).   02/12/2018 Procedure   EUS Impression: - A mass was found in the distal esophagus into the gastroesophageal junction and extending to proximal cardia. A tissue diagnosis was obtained prior to this exam consistent with adenocarcinoma. This was staged at T2N0Mx, because of a loss of interface of  the muscularis propria as the distal GE Junction becomes the cardia and the mass moves into this region, but there are many more regions and images that suggest a T1 lesion into the submucosa.   05/29/2018 Imaging   NM PET Image Initial (PI) Skull Base To Thigh  IMPRESSION: 1. Hypermetabolic focus at the GE junction consistent with know adenocarcinoma. 2. No evidence of metastatic adenopathy in the mediastinum or upper abdomen. 3. No evidence of liver metastasis.  No pulmonary metastasis.   06/06/2018 -  Chemotherapy   Weeklycarboplatin and Taxol, with concurrent radiationstarting on 06/06/2018-07/10/18   06/06/2018 - 07/16/2018 Radiation Therapy   Concurrent chemoRT with Dr. Harvel Ricks on 06/06/2018-07/16/18   11/06/2018 PET scan   PET 11/06/18  IMPRESSION: 1. No residual hypermetabolic activity at the gastroesophageal junction. 2. No evidence of metastatic disease.   12/22/2018 Procedure   Upper Endoscopy by Dr. Loletha Carrow 12/22/18  IMPRESSION - Esophageal mucosal changes secondary to established short-segment Barrett's disease. - Erythematous, friable (with contact bleeding), nodular mucosa in the esophagus. Biopsied. May be inflammation from radiation. - Erythematous and petechial mucosa in the gastric fundus and gastric body. Appears likely related to radiation. - Multiple fundic gland polyps. - Eroded and nodular mucosa in the antrum. Biopsied. - Submucosal nodule found in the duodenum. Stable in size from prior exams.    12/22/2018 Pathology Results   Diagnosis 12/22/18 1. Surgical [P], random gastric BX - CHRONIC INACTIVE GASTRITIS. - THERE IS NO EVIDENCE OF HELICOBACTER PYLORI, DYSPLASIA, OR MALIGNANCY. - SEE COMMENT. 2. Surgical [P], esophagus, GE junction - GASTROESOPHAGEAL JUNCTION MUCOSA WITH MILD INFLAMMATION CONSISTENT WITH GASTROESOPHAGEAL REFLUX. - THERE IS NO EVIDENCE OF GOBLET CELL METAPLASIA, DYSPLASIA OR MALIGNANCY. - SEE COMMENT.   07/08/2019 Procedure   Upper  Endoscopy by Dr.  Danis 07/08/19  IMPRESSION - Nodule found in the esophagus. Biopsied to rule out recurrent malignancy vs radiation effect. - Food (residue) in the stomach. - Retained food in the duodenum.   07/08/2019 Pathology Results   Diagnosis Surgical [P], distal esophagus - POORLY DIFFERENTIATED ADENOCARCINOMA WITH SIGNET RING FEATURES   07/25/2019 PET scan   IMPRESSION: 1. Signs of disease recurrence at the gastroesophageal junction with local nodal disease as described.   08/14/2019 -  Chemotherapy   CAPOX q3weeks with oral Xeloda 2058m BID 2 weeks on/1 week off starting on 08/14/19, along with last cycle, but he did not feel any difference.  Reduced to 2000 mg in the morning and 1500 mg in the evening from cycle 8.                    --Now on  maintenance Xeloda 1500 mg twice daily, 2 weeks on and 1 week off starting 02/22/20   11/05/2019 PET scan   IMPRESSION: 1. Interval decrease in metabolic activity at the gastroesophageal junction. 2. Interval decrease in size and metabolic activity of gastrohepatic ligament metastatic lymph node. 3. No evidence of new or progressive disease in the chest, abdomen, or pelvis.      CURRENT THERAPY:  Now on  maintenance Xeloda 1500 mg twice daily, 2 weeks on and 1 week off starting 02/22/20. Given recent abdominal pain, will hold Xeloda for 1-2 weeks starting 03/30/20.   INTERVAL HISTORY:  Philip FRIEDMANNis here for a follow up. He presents to the clinic alone. He notes his last dose of Xeloda was 1 week ago and after stopping he started having constant diffuse abdominal pain which is moderate, 5-6/10. He notes he has adequate BM without bloody or black stool and denies N&V. He lost 10 pounds in 2-3 weeks. He notes he is eating less but still 3 meals. He gets some relief from his back pain medication Hydrocodone. He denies heart burn.     REVIEW OF SYSTEMS:   Constitutional: Denies fevers, chills (+) Weight loss  Eyes: Denies  blurriness of vision Ears, nose, mouth, throat, and face: Denies mucositis or sore throat Respiratory: Denies cough, dyspnea or wheezes Cardiovascular: Denies palpitation, chest discomfort or lower extremity swelling Gastrointestinal:  Denies nausea, heartburn or change in bowel habits (+) Diffuse gassy pain in mid abdomen Skin: Denies abnormal skin rashes Lymphatics: Denies new lymphadenopathy or easy bruising Neurological:Denies numbness, tingling or new weaknesses Behavioral/Psych: Mood is stable, no new changes  All other systems were reviewed with the patient and are negative.  MEDICAL HISTORY:  Past Medical History:  Diagnosis Date   Abdominal hernia    Anemia    Asthma    Barrett's esophagus    Cancer (HCC)    Esophagel cancer   Cataract    Bil surgery   Cough    over 2 weeks   Fluid retention in legs    GERD (gastroesophageal reflux disease)    H/O hiatal hernia    Hypertension    [place]    Sleep apnea    don't use c-pap at home/ tried for several years   SOB (shortness of breath)     SURGICAL HISTORY: Past Surgical History:  Procedure Laterality Date   BACK SURGERY     5th lumbar   BALLOON DILATION N/A 10/19/2016   Procedure: BALLOON DILATION;  Surgeon: SLadene Artist MD;  Location: [facility];  Service: Endoscopy;  Laterality: N/A;   COLONOSCOPY W/ POLYPECTOMY  ESOPHAGOGASTRODUODENOSCOPY N/A 10/19/2016   Procedure: ESOPHAGOGASTRODUODENOSCOPY (EGD);  Surgeon: Ladene Artist, MD;  Location: [facility];  Service: Endoscopy;  Laterality: N/A;   ESOPHAGOGASTRODUODENOSCOPY (EGD) WITH PROPOFOL N/A 02/12/2018   Procedure: ESOPHAGOGASTRODUODENOSCOPY (EGD) WITH PROPOFOL;  Surgeon: [name], MD;  Location: [facility];  Service: Gastroenterology;  Laterality: N/A;   EUS N/A 02/12/2018   Procedure: UPPER ENDOSCOPIC ULTRASOUND (EUS) RADIAL;  Surgeon: Irving Copas., MD;  Location: [facility];   Service: Gastroenterology;  Laterality: N/A;   EYE SURGERY Bilateral    cataract   LUMBAR LAMINECTOMY     1980's    I have reviewed the social history and family history with the patient and they are unchanged from previous note.  ALLERGIES:  has No Known Allergies.  MEDICATIONS:  Current Outpatient Medications  Medication Sig Dispense Refill   ALBUTEROL IN Inhale 2 puffs into the lungs every 4 (four) hours as needed (shortness of breath or wheezing).      amitriptyline (ELAVIL) 50 MG tablet Take 50 mg by mouth at bedtime.     aspirin EC 81 MG tablet Take 81 mg by mouth daily.     atorvastatin (LIPITOR) 10 MG tablet Take 10 mg by mouth at bedtime.      ciprofloxacin (CIPRO) 500 MG tablet Take 1 tablet (500 mg total) by mouth 2 (two) times daily. 10 tablet 0   dicyclomine (BENTYL) 10 MG capsule Take 1-2 capsules (10-20 mg total) by mouth every 8 (eight) hours as needed for spasms. 20 capsule 0   etodolac (LODINE) 200 MG capsule Take 200 mg by mouth every 8 (eight) hours as needed.     famotidine (PEPCID) 20 MG tablet Take 1 tablet (20 mg total) by mouth 2 (two) times daily. 60 tablet 1   furosemide (LASIX) 20 MG tablet Take 20 mg by mouth daily.     HYDROcodone-acetaminophen (NORCO) 7.5-325 MG tablet Take 1 tablet by mouth 4 (four) times daily as needed.      lisinopril (ZESTRIL) 20 MG tablet Take 20 mg by mouth daily.     Multiple Vitamin (MULTIVITAMIN) tablet Take 1 tablet by mouth daily.     mycophenolate (CELLCEPT) 500 MG tablet Take 2 tablets (1,000 mg total) by mouth 2 (two) times daily. 120 tablet 12   ondansetron (ZOFRAN) 8 MG tablet Take 1 tablet (8 mg total) by mouth 2 (two) times daily as needed for refractory nausea / vomiting. Start on day 3 after chemotherapy. 30 tablet 1   prochlorperazine (COMPAZINE) 10 MG tablet Take 1 tablet (10 mg total) by mouth every 6 (six) hours as needed (Nausea or vomiting). 30 tablet 1   pyridostigmine (MESTINON) 60 MG tablet  Take 0.5-1 tablets (30-60 mg total) by mouth 3 (three) times daily. 90 tablet 12   XELODA 500 MG tablet Take 3 tablets (1,500 mg total) by mouth 2 (two) times daily after a meal. Take for 14 days, then hold for 7 days. Repeat every 21 days. 84 tablet 2   Current Facility-Administered Medications  Medication Dose Route Frequency Provider Last Rate Last Admin   0.9 %  sodium chloride infusion  500 mL Intravenous Once Nelida Meuse III, MD        PHYSICAL EXAMINATION: ECOG PERFORMANCE STATUS: 2 - Symptomatic, <50% confined to bed  Vitals:   03/30/20 1534  BP: 129/80  Pulse: 89  Resp: 20  Temp: 97.6 F (36.4 C)  SpO2: 97%   Filed Weights   03/30/20 1534  Weight:  289 lb 8 oz (131.3 kg)    GENERAL:alert, no distress and comfortable SKIN: skin color, texture, turgor are normal, no rashes or significant lesions EYES: normal, Conjunctiva are pink and non-injected, sclera clear  NECK: supple, thyroid normal size, non-tender, without nodularity LYMPH:  no palpable lymphadenopathy in the cervical, axillary  LUNGS: clear to auscultation and percussion with normal breathing effort HEART: regular rate & rhythm and no murmurs and no lower extremity edema ABDOMEN:abdomen soft, non-tender and normal bowel sounds (+) Diffuse Tenderness of mid and upper abdomen.  Musculoskeletal:no cyanosis of digits and no clubbing  NEURO: alert & oriented x 3 with fluent speech, no focal motor/sensory deficits  LABORATORY DATA:  I have reviewed the data as listed CBC Latest Ref Rng & Units 03/30/2020 03/06/2020 02/11/2020  WBC 4.0 - 10.5 K/uL 8.8 7.8 6.9  Hemoglobin 13.0 - 17.0 g/dL 13.7 14.0 13.6  Hematocrit 39 - 52 % 39.5 40.4 38.8(L)  Platelets 150 - 400 K/uL 217 220 185     CMP Latest Ref Rng & Units 03/30/2020 03/06/2020 02/11/2020  Glucose 70 - 99 mg/dL 138(H) 154(H) 114(H)  BUN 8 - 23 mg/dL _0 Creatinine 0.61 - 1.24 mg/dL 0.80 0.81 0.76  Sodium 135 - 145 mmol/L 137 137 133(L)    Potassium 3.5 - 5.1 mmol/L 4.4 4.2 4.6  Chloride 98 - 111 mmol/L 105 106 105  CO2 22 - 32 mmol/L _1 Calcium 8.9 - 10.3 mg/dL 11.7(H) 11.2(H) 10.8(H)  Total Protein 6.5 - 8.1 g/dL 6.5 6.4(L) 6.3(L)  Total Bilirubin 0.3 - 1.2 mg/dL 0.9 1.2 1.1  Alkaline Phos 38 - 126 U/L 93 102 102  AST 15 - 41 U/L _2 ALT 0 - 44 U/L 32 27 33      RADIOGRAPHIC STUDIES: I have personally reviewed the radiological images as listed and agreed with the findings in the report. No results found.   ASSESSMENT & PLAN:  Philip [name] is a 74 y.o. male with   1. 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 Due to his age and comorbidities, hewas felt to be a poorcandidate for surgery.  -He completed concurrent chemoRT with CT.His post-treatment PET and endoscopy in 12/2018 showed no residual disease. -Unfortunately he either had residual disease vs localearly stage cancer recurrencein 07/08/19. -His [facility] Onetest showed PDL-1 10%+,mutation burden 10, HER-2 mutation(+), no other targeted therapy. Due to his history ofmyasthenia gravis,he is not a candidate for immunotherapy. -I started him onCAPOX q3weeks with oral Xeloda2039m BID2 weeks on/1 week offon4/14/21.He is now on maintenance Xeloda 1500 mg twice daily, 2 weeks on and 1 week off starting 02/22/20.  -After [id number] he started having diffuse mid abdominal gassy pain which is constant. He has diffuse tenderness of mid to upper abdomen on exam today with very active bowel sounds. Labs reviewed, CBC and CMP WNL except BG 138, CA 11.7. CEA still pending.  -Will hold Xeloda for 1-2 weeks. Will restart when his abdominal pain resolves  -Phone call next week. F/u in 3 weeks   2.Chronic back pain  -Continue toF/uwith pain  specialist, on Hydrocortisone. Continue to F/u with andorthopedicsurgeon, Dr SMaia Petties He is cleared for steroid injections -stable  3.HTN,Myasthenia gravis -Oncellcept 10021mIDfor longer term immunosuppressionand Mestinon.f/u with neurologist and PCP  4. Hypercalcemia,primary hyperparathyroidism -He takes Vitamin D. He  is fine to take multivitamin that contains low dose calcium.I advised him to avoid high dose calcium. -Prior labshavesupportedprimary hyperparathyroidism.F/u withPCP about this.  5.Goal of care discussion  -The patient understands the goal of care is palliative. -He is full code now -his POA is his Aunt in [place]  6. Diffuse abdominal pain  -S/p C2 Xeloda he started having diffuse mid abdominal gassy pain which is constant. He has diffuse tenderness of mid to upper abdomen on exam today with very active bowel sounds (03/30/20). -He does have history of diverticulosis. Given concern for diverticulitis, I called in antibiotic Cipro BID for 5 days.  -For the pain I will call in Bentyl cramping medication. He is agreeable. If still not improving, will scan him.    Plan -I called in Bentyl and Cipro today for his abdominal pian  -Hold Xeloda for 1-2 weeks.  -Phone call on 12/6 at 2:40pm -Lab and f/u in 3 weeks    No problem-specific Assessment & Plan notes found for this encounter.   No orders of the defined types were placed in this encounter.  All questions were answered. The patient knows to call the clinic with any problems, questions or concerns. No barriers to learning was detected. The total time spent in the appointment was 30 minutes.     Truitt Merle, MD 03/30/2020   I, Joslyn Devon, am acting as scribe for Truitt Merle, MD.   I have reviewed the above documentation for accuracy and completeness, and I agree with the above.

## 2020-03-30 ENCOUNTER — Other Ambulatory Visit: Payer: Self-pay

## 2020-03-30 ENCOUNTER — Inpatient Hospital Stay: Payer: Medicare Other

## 2020-03-30 ENCOUNTER — Inpatient Hospital Stay (HOSPITAL_BASED_OUTPATIENT_CLINIC_OR_DEPARTMENT_OTHER): Payer: Medicare Other | Admitting: Hematology

## 2020-03-30 ENCOUNTER — Encounter: Payer: Self-pay | Admitting: Hematology

## 2020-03-30 VITALS — BP 129/80 | HR 89 | Temp 97.6°F | Resp 20 | Ht 73.0 in | Wt 289.5 lb

## 2020-03-30 DIAGNOSIS — C160 Malignant neoplasm of cardia: Secondary | ICD-10-CM

## 2020-03-30 LAB — CMP (CANCER CENTER ONLY)
ALT: 32 U/L (ref 0–44)
AST: 30 U/L (ref 15–41)
Albumin: 3.7 g/dL (ref 3.5–5.0)
Alkaline Phosphatase: 93 U/L (ref 38–126)
Anion gap: 9 (ref 5–15)
BUN: 13 mg/dL (ref 8–23)
CO2: 23 mmol/L (ref 22–32)
Calcium: 11.7 mg/dL — ABNORMAL HIGH (ref 8.9–10.3)
Chloride: 105 mmol/L (ref 98–111)
Creatinine: 0.8 mg/dL (ref 0.61–1.24)
GFR, Estimated: >60 mL/min (ref >60)
Glucose, Bld: 138 mg/dL — ABNORMAL HIGH (ref 70–99)
Potassium: 4.4 mmol/L (ref 3.5–5.1)
Sodium: 137 mmol/L (ref 135–145)
Total Bilirubin: 0.9 mg/dL (ref 0.3–1.2)
Total Protein: 6.5 g/dL (ref 6.5–8.1)

## 2020-03-30 LAB — CBC WITH DIFFERENTIAL (CANCER CENTER ONLY)
Abs Immature Granulocytes: 0.02 10*3/uL (ref 0.00–0.07)
Basophils Absolute: 0.1 10*3/uL (ref 0.0–0.1)
Basophils Relative: 1 %
Eosinophils Absolute: 0.3 10*3/uL (ref 0.0–0.5)
Eosinophils Relative: 3 %
HCT: 39.5 % (ref 39.0–52.0)
Hemoglobin: 13.7 g/dL (ref 13.0–17.0)
Immature Granulocytes: 0 %
Lymphocytes Relative: 11 %
Lymphs Abs: 1 10*3/uL (ref 0.7–4.0)
MCH: 36 pg — ABNORMAL HIGH (ref 26.0–34.0)
MCHC: 34.7 g/dL (ref 30.0–36.0)
MCV: 103.7 fL — ABNORMAL HIGH (ref 80.0–100.0)
Monocytes Absolute: 1 10*3/uL (ref 0.1–1.0)
Monocytes Relative: 11 %
Neutro Abs: 6.5 10*3/uL (ref 1.7–7.7)
Neutrophils Relative %: 74 %
Platelet Count: 217 10*3/uL (ref 150–400)
RBC: 3.81 MIL/uL — ABNORMAL LOW (ref 4.22–5.81)
RDW: 13.2 % (ref 11.5–15.5)
WBC Count: 8.8 10*3/uL (ref 4.0–10.5)
nRBC: 0 % (ref 0.0–0.2)

## 2020-03-30 MED ORDER — DICYCLOMINE HCL 10 MG PO CAPS
10.0000 mg | ORAL_CAPSULE | Freq: Three times a day (TID) | ORAL | 0 refills | Status: DC | PRN
Start: 2020-03-30 — End: 2020-05-27

## 2020-03-30 MED ORDER — CIPROFLOXACIN HCL 500 MG PO TABS: 500.0000 mg | ORAL_TABLET | Freq: Two times a day (BID) | ORAL | 0 refills | Status: DC

## 2020-03-31 LAB — CEA (IN HOUSE-CHCC): CEA (CHCC-In House): 24 ng/mL — ABNORMAL HIGH (ref 0.00–5.00)

## 2020-04-01 ENCOUNTER — Telehealth: Payer: Self-pay

## 2020-04-01 ENCOUNTER — Telehealth: Payer: Self-pay | Admitting: Hematology

## 2020-04-01 ENCOUNTER — Other Ambulatory Visit: Payer: Self-pay

## 2020-04-01 DIAGNOSIS — C160 Malignant neoplasm of cardia: Secondary | ICD-10-CM

## 2020-04-01 NOTE — Telephone Encounter (Signed)
Scheduled appts per 11/29 los. Pt confirmed appt dates and times. Pt had a question regarding need for PET scan, transferred pt to desk nurse.

## 2020-04-01 NOTE — Telephone Encounter (Signed)
Mr Mcbryar called stating he hasn't eaten anything since Monday and has not had a bm.  He states his symptoms that he reported on Monday have worsened., He wants a CT scan for evaluation.

## 2020-04-02 ENCOUNTER — Encounter (HOSPITAL_COMMUNITY): Payer: Self-pay

## 2020-04-02 ENCOUNTER — Ambulatory Visit (HOSPITAL_COMMUNITY)
Admission: RE | Admit: 2020-04-02 | Discharge: 2020-04-02 | Disposition: A | Discharge status: Home / Self Care | Payer: Medicare Other | Source: Ambulatory Visit | Attending: Hematology | Admitting: Hematology

## 2020-04-02 DIAGNOSIS — C160 Malignant neoplasm of cardia: Secondary | ICD-10-CM | POA: Diagnosis present

## 2020-04-02 MED ORDER — IOHEXOL 9 MG/ML PO SOLN
ORAL | Status: AC
  Administered 2020-04-02: 1000 mL
  Filled 2020-04-02: qty 1000

## 2020-04-02 MED ORDER — IOHEXOL 300 MG/ML  SOLN
100.0000 mL | Freq: Once | INTRAMUSCULAR | Status: AC | PRN
  Administered 2020-04-02: 100 mL via INTRAVENOUS

## 2020-04-03 NOTE — Progress Notes (Signed)
[place]   [phone] Fax:([phone]) [phone]   Clinic Follow up Note   Patient Care Team: Philip Anchors, MD as PCP - General (Family Medicine) Mansouraty, Philip Nab., MD as Consulting Physician (Gastroenterology) Philip Kief, MD (Rehabilitation)   I connected with Philip [name] on 04/06/2020 at  2:40 PM EST by telephone visit and verified that I am speaking with the correct person using two identifiers.  I discussed the limitations, risks, security and privacy concerns of performing an evaluation and management service by telephone and the availability of in person appointments. I also discussed with the patient that there may be a patient responsible charge related to this service. The patient expressed understanding and agreed to proceed.   Other persons participating in the visit and their role in the encounter:  None  Patient's location:  His home  Provider's location:  My office   CHIEF COMPLAINT: f/uesophageal cancer  SUMMARY OF ONCOLOGIC HISTORY: Oncology History Overview Note  Cancer Staging Malignant neoplasm of gastroesophageal junction ([place]) Staging form: Esophagus - Adenocarcinoma, AJCC 8th Edition - Clinical stage from 02/26/2018: Stage IIB (cT2, cN0, cM0, G3) - Signed by Philip Feeling, NP on 02/26/2018    Malignant neoplasm of gastroesophageal junction ([place])  01/22/2018 Initial Biopsy   Diagnosis Surgical [P], GE junction nodule BX - POORLY 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   01/22/2018 Procedure   COLONOSCOPY IMPRESSION - Redundant colon. - The examination was otherwise normal on direct and retroflexion views. - No specimens collected.  UPPER ENDOSCOPY IMPRESSION: - Esophageal mucosal changes secondary to established short-segment Barrett's disease. - Mucosal nodule found in the esophagus. Biopsied. - Erythematous mucosa in the prepyloric region of the  stomach. - Submucosal nodule found in the duodenum.    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 No esophageal primary identified. No findings of metastatic disease in the chest, abdomen, or pelvis. 2.  Aortic Atherosclerosis (ICD10-I70.0).   02/12/2018 Procedure   EUS Impression: - A mass was found in the distal esophagus into the gastroesophageal junction and extending to proximal cardia. A tissue diagnosis was obtained prior to this exam consistent with adenocarcinoma. This was staged at T2N0Mx, because of a loss of interface of the muscularis propria as the distal GE Junction becomes the cardia and the mass moves into this region, but there are many more regions and images that suggest a T1 lesion into the submucosa.   05/29/2018 Imaging   NM PET Image Initial (PI) Skull Base To Thigh  IMPRESSION: 1. Hypermetabolic focus at the GE junction consistent with know adenocarcinoma. 2. No evidence of metastatic adenopathy in the mediastinum or upper abdomen. 3. No evidence of liver metastasis.  No pulmonary metastasis.   06/06/2018 -  Chemotherapy   Weeklycarboplatin and Taxol, with concurrent radiationstarting on 06/06/2018-07/10/18   06/06/2018 - 07/16/2018 Radiation Therapy   Concurrent chemoRT with Dr. Harvel [name] on 06/06/2018-07/16/18   11/06/2018 PET scan   PET 11/06/18  IMPRESSION: 1. No residual hypermetabolic activity at the gastroesophageal junction. 2. No evidence of metastatic disease.   12/22/2018 Procedure   Upper Endoscopy by Dr. Loletha [name] 12/22/18  IMPRESSION - Esophageal mucosal changes secondary to established short-segment Barrett's disease. - Erythematous, friable (with contact bleeding), nodular mucosa in the esophagus. Biopsied. May be inflammation from radiation. - Erythematous and petechial mucosa in the gastric fundus and gastric body. Appears likely related  to radiation. -  Multiple fundic gland polyps. - Eroded and nodular mucosa in the antrum. Biopsied. - Submucosal nodule found in the duodenum. Stable in size from prior exams.    12/22/2018 Pathology Results   Diagnosis 12/22/18 1. Surgical [P], random gastric BX - CHRONIC INACTIVE GASTRITIS. - THERE IS NO EVIDENCE OF HELICOBACTER PYLORI, DYSPLASIA, OR MALIGNANCY. - SEE COMMENT. 2. Surgical [P], esophagus, GE junction - GASTROESOPHAGEAL JUNCTION MUCOSA WITH MILD INFLAMMATION CONSISTENT WITH GASTROESOPHAGEAL REFLUX. - THERE IS NO EVIDENCE OF GOBLET CELL METAPLASIA, DYSPLASIA OR MALIGNANCY. - SEE COMMENT.   07/08/2019 Procedure   Upper Endoscopy by Dr. Loletha [name] 07/08/19  IMPRESSION - Nodule found in the esophagus. Biopsied to rule out recurrent malignancy vs radiation effect. - Food (residue) in the stomach. - Retained food in the duodenum.   07/08/2019 Pathology Results   Diagnosis Surgical [P], distal esophagus - POORLY DIFFERENTIATED ADENOCARCINOMA WITH SIGNET RING FEATURES   07/25/2019 PET scan   IMPRESSION: 1. Signs of disease recurrence at the gastroesophageal junction with local nodal disease as described.   08/14/2019 -  Chemotherapy   CAPOX q3weeks with oral Xeloda 2019m BID 2 weeks on/1 week off starting on 08/14/19, along with last cycle, but he did not feel any difference.  Reduced to 2000 mg in the morning and 1500 mg in the evening from cycle 8.                    --Now on  maintenance Xeloda 1500 mg twice daily, 2 weeks on and 1 week off starting 02/22/20   11/05/2019 PET scan   IMPRESSION: 1. Interval decrease in metabolic activity at the gastroesophageal junction. 2. Interval decrease in size and metabolic activity of gastrohepatic ligament metastatic lymph node. 3. No evidence of new or progressive disease in the chest, abdomen, or pelvis.      CURRENT THERAPY:  Now on maintenance Xeloda 1500 mg twice daily, 2 weeks on and 1 week offstarting 02/22/20. Given recent abdominal  pain/pancreatitis, will hold Xeloda starting 03/30/20.   INTERVAL HISTORY:  [name] here for a follow up. He notes his stomach pain has mildly increased and exacerbated by eating. He notes nauseous, but no vomiting. He is still able to function at home. He note she denies fever.    REVIEW OF SYSTEMS:   Constitutional: Denies fevers, chills or abnormal weight loss Eyes: Denies blurriness of vision Ears, nose, mouth, throat, and face: Denies mucositis or sore throat Respiratory: Denies cough, dyspnea or wheezes Cardiovascular: Denies palpitation, chest discomfort or lower extremity swelling Gastrointestinal:  Denies nausea, heartburn or change in bowel habits Skin: Denies abnormal skin rashes Lymphatics: Denies new lymphadenopathy or easy bruising Neurological:Denies numbness, tingling or new weaknesses Behavioral/Psych: Mood is stable, no new changes  All other systems were reviewed with the patient and are negative.  MEDICAL HISTORY:  Past Medical History:  Diagnosis Date  . Abdominal hernia   . Anemia   . Asthma   . Barrett's esophagus   . Cancer (HCC)    Esophagel cancer  . Cataract    Bil surgery  . Cough    over 2 weeks  . Fluid retention in legs   . GERD (gastroesophageal reflux disease)   . H/O hiatal hernia   . Hypertension   . [place]   . Sleep apnea    don't use c-pap at home/ tried for several years  . SOB (shortness of breath)     SURGICAL HISTORY: Past  Surgical History:  Procedure Laterality Date  . BACK SURGERY     5th lumbar  . BALLOON DILATION N/A 10/19/2016   Procedure: BALLOON DILATION;  Surgeon: Ladene Artist, MD;  Location: [facility];  Service: Endoscopy;  Laterality: N/A;  . COLONOSCOPY W/ POLYPECTOMY    . ESOPHAGOGASTRODUODENOSCOPY N/A 10/19/2016   Procedure: ESOPHAGOGASTRODUODENOSCOPY (EGD);  Surgeon: Ladene Artist, MD;  Location: [facility];  Service: Endoscopy;  Laterality: N/A;  .  ESOPHAGOGASTRODUODENOSCOPY (EGD) WITH PROPOFOL N/A 02/12/2018   Procedure: ESOPHAGOGASTRODUODENOSCOPY (EGD) WITH PROPOFOL;  Surgeon: Rush Landmark Philip Nab., MD;  Location: [facility];  Service: Gastroenterology;  Laterality: N/A;  . EUS N/A 02/12/2018   Procedure: UPPER ENDOSCOPIC ULTRASOUND (EUS) RADIAL;  Surgeon: Rush Landmark Philip Nab., MD;  Location: [facility];  Service: Gastroenterology;  Laterality: N/A;  . EYE SURGERY Bilateral    cataract  . LUMBAR LAMINECTOMY     1980's    I have reviewed the social history and family history with the patient and they are unchanged from previous note.  ALLERGIES:  has No Known Allergies.  MEDICATIONS:  Current Outpatient Medications  Medication Sig Dispense Refill  . ALBUTEROL IN Inhale 2 puffs into the lungs every 4 (four) hours as needed (shortness of breath or wheezing).     [name] amitriptyline (ELAVIL) 50 MG tablet Take 50 mg by mouth at bedtime.    [name] aspirin EC 81 MG tablet Take 81 mg by mouth daily.    [name] atorvastatin (LIPITOR) 10 MG tablet Take 10 mg by mouth at bedtime.     . ciprofloxacin (CIPRO) 500 MG tablet Take 1 tablet (500 mg total) by mouth 2 (two) times daily. 10 tablet 0  . dicyclomine (BENTYL) 10 MG capsule Take 1-2 capsules (10-20 mg total) by mouth every 8 (eight) hours as needed for spasms. 20 capsule 0  . etodolac (LODINE) 200 MG capsule Take 200 mg by mouth every 8 (eight) hours as needed.    . famotidine (PEPCID) 20 MG tablet TAKE ONE TABLET BY MOUTH TWICE A DAY 60 tablet 1  . furosemide (LASIX) 20 MG tablet Take 20 mg by mouth daily.    [name] HYDROcodone-acetaminophen (NORCO) 7.5-325 MG tablet Take 1 tablet by mouth 4 (four) times daily as needed.     [name] lisinopril (ZESTRIL) 20 MG tablet Take 20 mg by mouth daily.    . Multiple Vitamin (MULTIVITAMIN) tablet Take 1 tablet by mouth daily.    . mycophenolate (CELLCEPT) 500 MG tablet Take 2 tablets (1,000 mg total) by mouth 2 (two) times daily. 120 tablet 12  . ondansetron  (ZOFRAN) 8 MG tablet Take 1 tablet (8 mg total) by mouth 2 (two) times daily as needed for refractory nausea / vomiting. Start on day 3 after chemotherapy. 30 tablet 1  . prochlorperazine (COMPAZINE) 10 MG tablet Take 1 tablet (10 mg total) by mouth every 6 (six) hours as needed (Nausea or vomiting). 30 tablet 1  . pyridostigmine (MESTINON) 60 MG tablet Take 0.5-1 tablets (30-60 mg total) by mouth 3 (three) times daily. 90 tablet 12  . XELODA 500 MG tablet Take 3 tablets (1,500 mg total) by mouth 2 (two) times daily after a meal. Take for 14 days, then hold for 7 days. Repeat every 21 days. 84 tablet 2   Current Facility-Administered Medications  Medication Dose Route Frequency Provider Last Rate Last Admin  . 0.9 %  sodium chloride infusion  500 mL Intravenous Once Doran Stabler, MD  PHYSICAL EXAMINATION: ECOG PERFORMANCE STATUS: 2 - Symptomatic, <50% confined to bed  No vitals taken today, Exam not performed today   LABORATORY DATA:  I have reviewed the data as listed CBC Latest Ref Rng & Units 03/30/2020 03/06/2020 02/11/2020  WBC 4.0 - 10.5 K/uL 8.8 7.8 6.9  Hemoglobin 13.0 - 17.0 g/dL 13.7 14.0 13.6  Hematocrit 39 - 52 % 39.5 40.4 38.8(L)  Platelets 150 - 400 K/uL 217 220 185     CMP Latest Ref Rng & Units 03/30/2020 03/06/2020 02/11/2020  Glucose 70 - 99 mg/dL 138(H) 154(H) 114(H)  BUN 8 - 23 mg/dL _0 Creatinine 0.61 - 1.24 mg/dL 0.80 0.81 0.76  Sodium 135 - 145 mmol/L 137 137 133(L)  Potassium 3.5 - 5.1 mmol/L 4.4 4.2 4.6  Chloride 98 - 111 mmol/L 105 106 105  CO2 22 - 32 mmol/L _1 Calcium 8.9 - 10.3 mg/dL 11.7(H) 11.2(H) 10.8(H)  Total Protein 6.5 - 8.1 g/dL 6.5 6.4(L) 6.3(L)  Total Bilirubin 0.3 - 1.2 mg/dL 0.9 1.2 1.1  Alkaline Phos 38 - 126 U/L 93 102 102  AST 15 - 41 U/L _2 ALT 0 - 44 U/L 32 27 33      RADIOGRAPHIC STUDIES: I have personally reviewed the radiological images as listed and agreed with the findings in the report. No  results found.   ASSESSMENT & PLAN:  Philip [name] is a 74 y.o. male with    1. Acute pancreatitis  --S/p C2 Xeloda he started having diffuse mid abdominal gassy pain which is constant. He has diffuse tenderness of mid to upper abdomen on 03/30/20 exam with very active bowel sounds.  -His 04/02/20 CT AP showed Acute interstitial edematous pancreatitis without evidence of acute peripancreatic fluid collection and possible 46m gallstones. I personally reviewed the scan and discussed with pt  -His abdominal pain is overall stable. He is eating well and still able to function at home. He has hydrocodone which he can take q6hours (prescribed for chronic back pain).  -I discussed eating liquid or soft diet which is easy to breakdown and no greasy or fried food. I discussed gallstone can pass. If this does not pass or pain worsens he may go to ED. -I discussed with GI Dr. DLoletha Carrowand general surgeon Dr. AZenia Residesand made an urgent referral to Dr. AZenia Resides  2.  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. Due to his age and comorbidities, hewas felt to be a poorcandidate for surgery.  -He completed concurrent chemoRT with CT.His post-treatment PET and endoscopy in 12/2018 showed no residual disease. -Unfortunately he either had residual disease vs localearly stage cancer recurrencein 07/08/19. -His [facility] Onetest showed PDL-1 10%+,mutation burden 10, HER-2 mutation(+), no other targeted therapy. Due to his history ofmyasthenia gravis,he is not a candidate for immunotherapy. -I started him onCAPOX q3weeks with oral Xeloda20041mBID2 weeks on/1 week offon4/14/21.He is now on maintenanceXeloda 1500 mg twice daily, 2 weeks on and 1 week offstarting 02/22/20.  -Xeloda held after 03/30/20 due to  onset of diffuse mid abdominal gassy pain which is constant. His CT AP showed pancreatitis and possible gall stone.  -I discussed his CEA was elevated at 24. I discussed this could be reactive from pancreatitis. I will repeat soon.  -F/u open    3.Chronic back pain  -  Continue toF/uwith pain specialist, on Hydrocodone. Continue to F/u with andorthopedicsurgeon, Dr Maia Petties. He is cleared for steroid injections -stable  4.HTN,Myasthenia gravis -Oncellcept 1035mBIDfor longer term immunosuppressionand Mestinon.f/u with neurologist and PCP  5. Hypercalcemia,primary hyperparathyroidism -He takes Vitamin D. He is fine to take multivitamin that contains low dose calcium.I advised him to avoid high dose calcium. -Prior labshavesupportedprimary hyperparathyroidism.F/u withPCP about this.  6.Goal of care discussion  -He is full code now. [place]-Continue to hold [place]eloda  -CT reviewed, I made an urgent referral to general surgeon Dt. AZenia Resides -he knows to go to ED if pain is worse or he develops fever    No problem-specific Assessment & Plan notes found for this encounter.   No orders of the defined types were placed in this encounter.  I discussed the assessment and treatment plan with the patient. The patient was provided an opportunity to ask questions and all were answered. The patient agreed with the plan and demonstrated an understanding of the instructions.  The patient was advised to call back or seek an in-person evaluation if the symptoms worsen or if the condition fails to improve as anticipated.  The total time spent in the appointment was 25 minutes.    YTruitt Merle MD 04/06/2020   I, AJoslyn Devon am acting as scribe for YTruitt Merle MD.   I have reviewed the above documentation for accuracy and completeness, and I agree with the above.

## 2020-04-06 ENCOUNTER — Inpatient Hospital Stay: Payer: Medicare Other | Attending: Nurse Practitioner | Admitting: Hematology

## 2020-04-06 ENCOUNTER — Other Ambulatory Visit: Payer: Self-pay | Admitting: Nurse Practitioner

## 2020-04-06 DIAGNOSIS — K859 Acute pancreatitis without necrosis or infection, unspecified: Secondary | ICD-10-CM | POA: Insufficient documentation

## 2020-04-06 DIAGNOSIS — G7000 Myasthenia gravis without (acute) exacerbation: Secondary | ICD-10-CM | POA: Insufficient documentation

## 2020-04-06 DIAGNOSIS — Q438 Other specified congenital malformations of intestine: Secondary | ICD-10-CM | POA: Insufficient documentation

## 2020-04-06 DIAGNOSIS — M549 Dorsalgia, unspecified: Secondary | ICD-10-CM | POA: Insufficient documentation

## 2020-04-06 DIAGNOSIS — C160 Malignant neoplasm of cardia: Secondary | ICD-10-CM | POA: Diagnosis not present

## 2020-04-06 DIAGNOSIS — Z79899 Other long term (current) drug therapy: Secondary | ICD-10-CM | POA: Insufficient documentation

## 2020-04-06 DIAGNOSIS — G8929 Other chronic pain: Secondary | ICD-10-CM | POA: Insufficient documentation

## 2020-04-07 ENCOUNTER — Ambulatory Visit (HOSPITAL_COMMUNITY)
Admission: RE | Admit: 2020-04-07 | Discharge: 2020-04-07 | Disposition: A | Discharge status: Home / Self Care | Payer: Medicare Other | Source: Ambulatory Visit | Attending: Hematology | Admitting: Hematology

## 2020-04-07 ENCOUNTER — Other Ambulatory Visit: Payer: Self-pay | Admitting: Hematology

## 2020-04-07 ENCOUNTER — Encounter: Payer: Self-pay | Admitting: Hematology

## 2020-04-07 ENCOUNTER — Telehealth: Payer: Self-pay

## 2020-04-07 ENCOUNTER — Other Ambulatory Visit: Payer: Self-pay

## 2020-04-07 DIAGNOSIS — K859 Acute pancreatitis without necrosis or infection, unspecified: Secondary | ICD-10-CM

## 2020-04-07 NOTE — Telephone Encounter (Signed)
I spoke with Philip [name] he has been npo since 0300 this am.  Abdominal u/s scheduled for today at 1400.  I reviewed with patient and told not to eat or drink until after the u/s.  I told him he could take pain medication with sip of water.  He verbalized understanding.

## 2020-04-08 ENCOUNTER — Telehealth: Payer: Self-pay | Admitting: Hematology

## 2020-04-08 NOTE — Telephone Encounter (Signed)
No 12/6 los. No changes made to pt's schedule.

## 2020-04-11 ENCOUNTER — Telehealth: Payer: Self-pay | Admitting: Hematology

## 2020-04-11 NOTE — Telephone Encounter (Signed)
Pt was referred to Dr. Zenia Resides for pancreatitis and Cholelithiasis. He was seen on 12/8. Dr. Zenia Resides plan to do cholecystectomy after his acute pancreatitis resolves. I plan to hold his chemo for now. He is now being managed by pain meds and oral hydration. He will see Dr. Zenia Resides again next week, and see [place] in 2 weeks. He knows to call Dr. Zenia Resides or [place] or go to ED if his symptoms get worse.   Truitt Merle  04/11/2020

## 2020-04-14 ENCOUNTER — Other Ambulatory Visit: Payer: Self-pay

## 2020-04-14 ENCOUNTER — Ambulatory Visit: Payer: Self-pay | Admitting: Surgery

## 2020-04-14 DIAGNOSIS — K858 Other acute pancreatitis without necrosis or infection: Secondary | ICD-10-CM

## 2020-04-14 NOTE — H&P (Signed)
History of Present Illness (Joy Haegele L. Zenia Resides MD; 04/14/2020 12:01 PM) The patient is a 74 year old male who presents with acute pancreatitis.Mr. Ketelsen is a 74 yo male who was referred last week with acute pancreatitis and gallstones. He presents today for follow up. He says his epigastric pain has resolved, but he has RUQ pain after eating. He eats only small meals to avoid pain. He is continuing to stay hydrated. LFTs last week were normal, triglycerides were not significantly elevated. He endorses constipation and has not had a bowel movement in several days. He is passing flatus.   Allergies Emeline Gins, [place]; 04/14/2020 9:58 AM) No Known Drug Allergies  [04/08/2020]: Allergies Reconciled   Medication History Emeline Gins, CMA; 04/14/2020 9:58 AM) HYDROcodone-Acetaminophen (7.5-325MG  Tablet, Oral) Active. Atorvastatin Calcium (10MG  Tablet, Oral) Active. Lisinopril (20MG  Tablet, Oral) Active. Amitriptyline HCl (50MG  Tablet, Oral) Active. Ciprofloxacin HCl (500MG  Tablet, Oral) Active. Furosemide (20MG  Tablet, Oral) Active. Dicyclomine HCl (10MG  Capsule, Oral) Active. Famotidine (20MG  Tablet, Oral) Active. Etodolac (500MG  Tablet, Oral) Active. Mycophenolate Mofetil (500MG  Tablet, Oral) Active. Ondansetron HCl (8MG  Tablet, Oral) Active. Prochlorperazine Maleate (10MG  Tablet, Oral) Active. Pyridostigmine Bromide (60MG  Tablet, Oral) Active. Multiple Vitamin (1 (one) Oral) Active. Aspirin (81MG  Tablet, Oral) Active. Albuterol (Inhalation) Specific strength unknown - Active. Medications Reconciled  Vitals Emeline Gins CMA; 04/14/2020 9:58 AM) 04/14/2020 9:57 AM Weight: 284.4 lb Height: 72in Body Surface Area: 2.47 m Body Mass Index: 38.57 kg/m  Temp.: 97.90F  Pulse: 106 (Regular)  BP: 128/68(Sitting, Left Arm, Standard)       Physical Exam (Eliezer Khawaja L. Zenia Resides MD; 04/14/2020 12:01 PM) The physical exam findings are as follows: Note:  General: resting comfortably, NAD Neuro: alert and oriented Resp: normal work of breathing on room air Abdomen: soft, nondistended, no tenderness to palpation    Assessment & Plan ([place] L. Zenia Resides MD; 04/14/2020 12:03 PM) SYMPTOMATIC CHOLELITHIASIS (K80.20) Impression: 74 yo male with acute pancreatitis, likely biliary in etiology. His pancreatitis symptoms seem to have resolved, however he is having postprandial RUQ pain consistent with biliary colic. I recommend laparoscopic cholecystectomy to relieve the symptoms and prevent recurrent acute pancreatitis. He will need intraoperative cholangiogram as well. I discussed the details of the procedure with the patient and explained the risks including bleeding, infection, and <0.5% risk of common bile duct injury. He agrees to proceed with surgery. I will reach out to his neurologist to discuss periop management of his myasthenia gravis, and will likely observe patient overnight after surgery.

## 2020-04-14 NOTE — H&P (View-Only) (Signed)
History of Present Illness (Keirston Saephanh L. Zenia Resides MD; 04/14/2020 12:01 PM) The patient is a 74 year old male who presents with acute pancreatitis.Mr. Vernon is a 74 yo male who was referred last week with acute pancreatitis and gallstones. He presents today for follow up. He says his epigastric pain has resolved, but he has RUQ pain after eating. He eats only small meals to avoid pain. He is continuing to stay hydrated. LFTs last week were normal, triglycerides were not significantly elevated. He endorses constipation and has not had a bowel movement in several days. He is passing flatus.   Allergies Emeline Gins, [place]; 04/14/2020 9:58 AM) No Known Drug Allergies  [04/08/2020]: Allergies Reconciled   Medication History Emeline Gins, CMA; 04/14/2020 9:58 AM) HYDROcodone-Acetaminophen (7.5-325MG  Tablet, Oral) Active. Atorvastatin Calcium (10MG  Tablet, Oral) Active. Lisinopril (20MG  Tablet, Oral) Active. Amitriptyline HCl (50MG  Tablet, Oral) Active. Ciprofloxacin HCl (500MG  Tablet, Oral) Active. Furosemide (20MG  Tablet, Oral) Active. Dicyclomine HCl (10MG  Capsule, Oral) Active. Famotidine (20MG  Tablet, Oral) Active. Etodolac (500MG  Tablet, Oral) Active. Mycophenolate Mofetil (500MG  Tablet, Oral) Active. Ondansetron HCl (8MG  Tablet, Oral) Active. Prochlorperazine Maleate (10MG  Tablet, Oral) Active. Pyridostigmine Bromide (60MG  Tablet, Oral) Active. Multiple Vitamin (1 (one) Oral) Active. Aspirin (81MG  Tablet, Oral) Active. Albuterol (Inhalation) Specific strength unknown - Active. Medications Reconciled  Vitals Emeline Gins CMA; 04/14/2020 9:58 AM) 04/14/2020 9:57 AM Weight: 284.4 lb Height: 72in Body Surface Area: 2.47 m Body Mass Index: 38.57 kg/m  Temp.: 97.15F  Pulse: 106 (Regular)  BP: 128/68(Sitting, Left Arm, Standard)       Physical Exam (Raechelle Sarti L. Zenia Resides MD; 04/14/2020 12:01 PM) The physical exam findings are as follows: Note:  General: resting comfortably, NAD Neuro: alert and oriented Resp: normal work of breathing on room air Abdomen: soft, nondistended, no tenderness to palpation    Assessment & Plan ([place] L. Zenia Resides MD; 04/14/2020 12:03 PM) SYMPTOMATIC CHOLELITHIASIS (K80.20) Impression: 74 yo male with acute pancreatitis, likely biliary in etiology. His pancreatitis symptoms seem to have resolved, however he is having postprandial RUQ pain consistent with biliary colic. I recommend laparoscopic cholecystectomy to relieve the symptoms and prevent recurrent acute pancreatitis. He will need intraoperative cholangiogram as well. I discussed the details of the procedure with the patient and explained the risks including bleeding, infection, and <0.5% risk of common bile duct injury. He agrees to proceed with surgery. I will reach out to his neurologist to discuss periop management of his myasthenia gravis, and will likely observe patient overnight after surgery.

## 2020-04-17 NOTE — Patient Instructions (Addendum)
DUE TO COVID-19 ONLY ONE VISITOR IS ALLOWED TO COME WITH YOU AND STAY IN THE WAITING ROOM ONLY DURING PRE OP AND PROCEDURE DAY OF SURGERY. THE 1 VISITOR  MAY VISIT WITH YOU AFTER SURGERY IN YOUR PRIVATE ROOM DURING VISITING HOURS ONLY!  YOU NEED TO HAVE A COVID 19 TEST ON_12/20______ @_12 :00______, THIS TEST MUST BE DONE BEFORE SURGERY,  COVID TESTING SITE [place] [id number], IT IS ON THE RIGHT GOING OUT [address] APPROXIMATELY  2 MINUTES PAST [employer] ON THE RIGHT. ONCE YOUR COVID TEST IS COMPLETED,  PLEASE BEGIN THE QUARANTINE INSTRUCTIONS AS OUTLINED IN YOUR HANDOUT.                Philip [name]    Your procedure is scheduled on: 04/23/20   Report to [facility] Main  Entrance   Report to admitting at  8:30 AM     Call this number if you have problems the morning of surgery [phone]   . BRUSH YOUR TEETH MORNING OF SURGERY AND RINSE YOUR MOUTH OUT, NO CHEWING GUM CANDY OR MINTS.     Take these medicines the morning of surgery with A SIP OF WATER: Use your inhaler and bring it with you to the hospital                                 You may not have any metal on your body including              piercings  Do not wear jewelry, lotions, powders or deodorant                         Men may shave face and neck.   Do not bring valuables to the hospital. [place].  Contacts, dentures or bridgework may not be worn into surgery.      Patients discharged the day of surgery will not be allowed to drive home.   IF YOU ARE HAVING SURGERY AND GOING HOME THE SAME DAY, YOU MUST HAVE AN ADULT TO DRIVE YOU HOME AND BE WITH YOU FOR 24 HOURS.   YOU MAY GO HOME BY TAXI OR UBER OR ORTHERWISE, BUT AN ADULT MUST ACCOMPANY YOU HOME AND STAY WITH YOU FOR 24 HOURS.  Name and phone number of your driver:  Special Instructions: N/A              Please read over the following fact sheets you were  given: _____________________________________________________________________             [facility] - Preparing for Surgery Before surgery, you can play an important role.   Because skin is not sterile, your skin needs to be as free of germs as possible.   You can reduce the number of germs on your skin by washing with CHG (chlorahexidine gluconate) soap before surgery.   CHG is an antiseptic cleaner which kills germs and bonds with the skin to continue killing germs even after washing. Please DO NOT use if you have an allergy to CHG or antibacterial soaps.   If your skin becomes reddened/irritated stop using the CHG and inform your nurse when you arrive at Short Stay. Do not shave (including legs and underarms) for at least 48 hours  prior to the first CHG shower.    Please follow these instructions carefully:  1.  Shower with CHG Soap the night before surgery and the  morning of Surgery.  2.  If you choose to wash your hair, wash your hair first as usual with your  normal  shampoo.  3.  After you shampoo, rinse your hair and body thoroughly to remove the  shampoo.                                        4.  Use CHG as you would any other liquid soap.  You can apply chg directly  to the skin and wash                       Gently with a scrungie or clean washcloth.  5.  Apply the CHG Soap to your body ONLY FROM THE NECK DOWN.   Do not use on face/ open                           Wound or open sores. Avoid contact with eyes, ears mouth and genitals (private parts).                       Wash face,  Genitals (private parts) with your normal soap.             6.  Wash thoroughly, paying special attention to the area where your surgery  will be performed.  7.  Thoroughly rinse your body with warm water from the neck down.  8.  DO NOT shower/wash with your normal soap after using and rinsing off  the CHG Soap.             9.  Pat yourself dry with a clean towel.            10.  Wear clean  pajamas.            11.  Place clean sheets on your bed the night of your first shower and do not  sleep with pets. Day of Surgery : Do not apply any lotions/deodorants the morning of surgery.  Please wear clean clothes to the hospital/surgery center.  FAILURE TO FOLLOW THESE INSTRUCTIONS MAY RESULT IN THE CANCELLATION OF YOUR SURGERY PATIENT SIGNATURE_________________________________  NURSE SIGNATURE__________________________________  ________________________________________________________________________

## 2020-04-19 NOTE — Progress Notes (Signed)
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   01/22/2018 Procedure   COLONOSCOPY IMPRESSION - Redundant colon. - The examination was otherwise normal on direct and retroflexion views. - No specimens collected.  UPPER ENDOSCOPY IMPRESSION: - Esophageal mucosal changes secondary to established short-segment Barrett's disease. - Mucosal nodule found in the esophagus. Biopsied. - Erythematous mucosa in the prepyloric region of the stomach. - Submucosal nodule found in the duodenum.    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 No esophageal primary identified. No findings of metastatic disease in the chest, abdomen, or pelvis. 2.  Aortic Atherosclerosis (ICD10-I70.0).   02/12/2018 Procedure   EUS Impression: - A mass was found in the distal esophagus into the gastroesophageal junction and extending to proximal cardia. A tissue diagnosis was obtained prior to this exam consistent with adenocarcinoma. This was staged at T2N0Mx, because of a loss of interface of  the muscularis propria as the distal GE Junction becomes the cardia and the mass moves into this region, but there are many more regions and images that suggest a T1 lesion into the submucosa.   05/29/2018 Imaging   NM PET Image Initial (PI) Skull Base To Thigh  IMPRESSION: 1. Hypermetabolic focus at the GE junction consistent with know adenocarcinoma. 2. No evidence of metastatic adenopathy in the mediastinum or upper abdomen. 3. No evidence of liver metastasis.  No pulmonary metastasis.   06/06/2018 -  Chemotherapy   Weeklycarboplatin and Taxol, with concurrent radiationstarting on 06/06/2018-07/10/18   06/06/2018 - 07/16/2018 Radiation Therapy   Concurrent chemoRT with Philip [name] on 06/06/2018-07/16/18   11/06/2018 PET scan   PET 11/06/18  IMPRESSION: 1. No residual hypermetabolic activity at the gastroesophageal junction. 2. No evidence of metastatic disease.   12/22/2018 Procedure   Upper Endoscopy by Dr. Loletha [name] 12/22/18  IMPRESSION - Esophageal mucosal changes secondary to established short-segment Barrett's disease. - Erythematous, friable (with contact bleeding), nodular mucosa in the esophagus. Biopsied. May be inflammation from radiation. - Erythematous and petechial mucosa in the gastric fundus and gastric body. Appears likely related to radiation. - Multiple fundic gland polyps. - Eroded and nodular mucosa in the antrum. Biopsied. - Submucosal nodule found in the duodenum. Stable in size from prior exams.    12/22/2018 Pathology Results   Diagnosis 12/22/18 1. Surgical [P], random gastric BX - CHRONIC INACTIVE GASTRITIS. - THERE IS NO EVIDENCE OF HELICOBACTER PYLORI, DYSPLASIA, OR MALIGNANCY. - SEE COMMENT. 2. Surgical [P], esophagus, GE junction - GASTROESOPHAGEAL JUNCTION MUCOSA WITH MILD INFLAMMATION CONSISTENT WITH GASTROESOPHAGEAL REFLUX. - THERE IS NO EVIDENCE OF GOBLET CELL METAPLASIA, DYSPLASIA OR MALIGNANCY. - SEE COMMENT.   07/08/2019 Procedure   Upper  Endoscopy by Dr. Loletha [name]  07/08/19  IMPRESSION - Nodule found in the esophagus. Biopsied to rule out recurrent malignancy vs radiation effect. - Food (residue) in the stomach. - Retained food in the duodenum.   07/08/2019 Pathology Results   Diagnosis Surgical [P], distal esophagus - POORLY DIFFERENTIATED ADENOCARCINOMA WITH SIGNET RING FEATURES   07/25/2019 PET scan   IMPRESSION: 1. Signs of disease recurrence at the gastroesophageal junction with local nodal disease as described.   08/14/2019 -  Chemotherapy   CAPOX q3weeks with oral Xeloda 2052m BID 2 weeks on/1 week off starting on 08/14/19, along with last cycle, but he did not feel any difference.  Reduced to 2000 mg in the morning and 1500 mg in the evening from cycle 8.                    --Now on  maintenance Xeloda 1500 mg twice daily, 2 weeks on and 1 week off starting 02/22/20   11/05/2019 PET scan   IMPRESSION: 1. Interval decrease in metabolic activity at the gastroesophageal junction. 2. Interval decrease in size and metabolic activity of gastrohepatic ligament metastatic lymph node. 3. No evidence of new or progressive disease in the chest, abdomen, or pelvis.     CURRENT THERAPY:  Now on maintenance Xeloda 1500 mg twice daily, 2 weeks on and 1 week offstarting 02/22/20. Given recent abdominal pain/pancreatitis, will hold Xeloda starting 03/30/20 pending lap chole by Dr. AZenia [name]  INTERVAL HISTORY: Mr. CFallinreturns for follow up as scheduled. Chemo remains on hold for cholecystectomy with Dr. AZenia Resideson 12/23.  He is not [name] well, continues to have right upper quadrant and Epigastric pain worse after eating.  Rates pain 7/10, takes Norco 4 times daily which helps.  He is drinking well but not eating much due to pain.  Denies nausea/vomiting, fever or chills.  He is constipated due to eating less.  Passing gas.  No chest pain or dyspnea.  He has bothersome tingling in the fingertips without any functional difficulties.  He  remains able to be out of bed, active at home with ADLs.   MEDICAL HISTORY:  Past Medical History:  Diagnosis Date  . Abdominal hernia   . Asthma    mild  . Barrett's esophagus   . Cancer (HCC)    Esophagel cancer  . Cataract    Bil surgery  . Cough    over 2 weeks  . Fluid retention in legs   . GERD (gastroesophageal reflux disease)   . H/O hiatal hernia   . Hypertension   . Myasthenia gravis ([place]   . Sleep apnea    don't use c-pap at home/ tried for several years  . SOB (shortness of breath)     SURGICAL HISTORY: Past Surgical History:  Procedure Laterality Date  . BACK SURGERY     5th lumbar  . BALLOON DILATION N/A 10/19/2016   Procedure: BALLOON DILATION;  Surgeon: SLadene Artist [name];  [facility];  Service: Endoscopy;  Laterality: N/A;  . COLONOSCOPY W/ POLYPECTOMY    . ESOPHAGOGASTRODUODENOSCOPY N/A 10/19/2016   Procedure: ESOPHAGOGASTRODUODENOSCOPY (EGD);  Surgeon: SLadene Artist [name];  [facility];  Service: Endoscopy;  Laterality: N/A;  . ESOPHAGOGASTRODUODENOSCOPY (EGD) WITH PROPOFOL N/A 02/12/2018   Procedure: ESOPHAGOGASTRODUODENOSCOPY (EGD) WITH PROPOFOL;  Surgeon: MRush LandmarkGTelford Nab, [name];  [facility]  Service: Gastroenterology;  Laterality: N/A;  . EUS N/A 02/12/2018   Procedure: UPPER ENDOSCOPIC ULTRASOUND (EUS) RADIAL;  Surgeon: MIrving Copas, [name];  Location:  [facility];  Service: Gastroenterology;  Laterality: N/A;  . EYE SURGERY Bilateral    cataract  . LUMBAR LAMINECTOMY     1980's    I have reviewed the social history and family history with the patient and they are unchanged from previous note.  ALLERGIES:  has No Known Allergies.  MEDICATIONS:  Current Outpatient Medications  Medication Sig Dispense Refill  . albuterol (VENTOLIN HFA) 108 (90 Base) MCG/ACT inhaler Inhale 2 puffs into the lungs every 4 (four) hours as needed for wheezing or shortness of breath.    [name] amitriptyline (ELAVIL) 50  MG tablet Take 50 mg by mouth at bedtime.    [name] aspirin EC 81 MG tablet Take 81 mg by mouth daily.    [name] atorvastatin (LIPITOR) 10 MG tablet Take 10 mg by mouth at bedtime.     . dicyclomine (BENTYL) 10 MG capsule Take 1-2 capsules (10-20 mg total) by mouth every 8 (eight) hours as needed for spasms. 20 capsule 0  . etodolac (LODINE) 200 MG capsule Take 200 mg by mouth every 8 (eight) hours as needed for moderate pain.    . famotidine (PEPCID) 20 MG tablet TAKE ONE TABLET BY MOUTH TWICE A DAY (Patient taking differently: Take 20 mg by mouth 2 (two) times daily.) 60 tablet 1  . HYDROcodone-acetaminophen (NORCO) 7.5-325 MG tablet Take 1 tablet by mouth 4 (four) times daily as needed for severe pain.    [name] lisinopril (ZESTRIL) 20 MG tablet Take 20 mg by mouth daily.    . Multiple Vitamin (MULTIVITAMIN) tablet Take 1 tablet by mouth daily.    . mycophenolate (CELLCEPT) 500 MG tablet Take 2 tablets (1,000 mg total) by mouth 2 (two) times daily. 120 tablet 12  . ondansetron (ZOFRAN) 8 MG tablet Take 1 tablet (8 mg total) by mouth 2 (two) times daily as needed for refractory nausea / vomiting. Start on day 3 after chemotherapy. 30 tablet 1  . prochlorperazine (COMPAZINE) 10 MG tablet Take 1 tablet (10 mg total) by mouth every 6 (six) hours as needed (Nausea or vomiting). 30 tablet 1  . pyridostigmine (MESTINON) 60 MG tablet Take 0.5-1 tablets (30-60 mg total) by mouth 3 (three) times daily. 90 tablet 12  . ciprofloxacin (CIPRO) 500 MG tablet Take 1 tablet (500 mg total) by mouth 2 (two) times daily. (Patient not taking: No sig reported) 10 tablet 0  . XELODA 500 MG tablet Take 3 tablets (1,500 mg total) by mouth 2 (two) times daily after a meal. Take for 14 days, then hold for 7 days. Repeat every 21 days. (Patient not taking: Reported on 04/21/2020) 84 tablet 2   Current Facility-Administered Medications  Medication Dose Route Frequency Provider Last Rate Last Admin  . 0.9 %  sodium chloride infusion  500  mL Intravenous Once Nelida Meuse III, [name]        PHYSICAL EXAMINATION: ECOG PERFORMANCE STATUS: 1 - Symptomatic but completely ambulatory  Vitals:   04/21/20 1115  BP: 128/68  Pulse: 88  Resp: 17  Temp: 98.8 F (37.1 C)  SpO2: 98%   Filed Weights   04/21/20 1115  Weight: 277 lb 1.6 oz (125.7 kg)    GENERAL:alert, no distress and comfortable SKIN: No rash EYES: sclera clear LUNGS: clear with normal breathing effort HEART: regular rate & rhythm, no lower extremity edema ABDOMEN:abdomen soft, positive bowel sounds.  TTP in RUQ and epigastric area NEURO: alert & oriented x 3 with fluent speech, no focal motor deficits  LABORATORY DATA:  I have reviewed the data as listed CBC Latest Ref Rng & Units 04/21/2020 04/20/2020 03/30/2020  WBC 4.0 - 10.5 K/uL 6.0 7.7 8.8  Hemoglobin 13.0 - 17.0 g/dL 12.8(L) 13.4 13.7  Hematocrit 39.0 - 52.0 % 36.9(L) 39.2 39.5  Platelets 150 - 400 K/uL 240 276 217     CMP Latest Ref Rng & Units 04/21/2020 03/30/2020 03/06/2020  Glucose 70 - 99 mg/dL 137(H) 138(H) 154(H)  BUN 8 - 23 mg/dL _0 Creatinine 0.61 - 1.24 mg/dL 0.84 0.80 0.81  Sodium 135 - 145 mmol/L 132(L) 137 137  Potassium 3.5 - 5.1 mmol/L 4.7 4.4 4.2  Chloride 98 - 111 mmol/L 105 105 106  CO2 22 - 32 mmol/L 20(L) 23 24  Calcium 8.9 - 10.3 mg/dL 11.1(H) 11.7(H) 11.2(H)  Total Protein 6.5 - 8.1 g/dL 6.5 6.5 6.4(L)  Total Bilirubin 0.3 - 1.2 mg/dL 1.1 0.9 1.2  Alkaline Phos 38 - 126 U/L 99 93 102  AST 15 - 41 U/L _1 ALT 0 - 44 U/L 23 32 27      RADIOGRAPHIC STUDIES: I have personally reviewed the radiological images as listed and agreed with the findings in the report. No results found.   ASSESSMENT & PLAN: Philip Goytia Coferis a 74 y.o.malewith   1. Acute appendicitis and symptomatic cholecystitis  -Onset after cycle 2 Xeloda he developed diffuse abdominal pain, CTAP 12/2 showed acute pancreatitis and gallstones -Xeloda on hold for scheduled lap chole with  Dr. Zenia [name] on 12/23 -Stable with postprandial pain on Norco  2. 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 -Due to his age and comorbidities, hewas felt to be a poorcandidate for surgery.  -He completed concurrent chemoRT with CT.Post-treatment PET and endoscopy in 12/2018 showed no residual disease -His Upper endoscopy by Dr. Loletha [name] from 07/08/19 showed nodule at distal esophagus/GEJ. Hisbiopsyshows poorly differentiated adenocarcinoma, appears early stage cancer recurrence vs residual disease.  -PET from 07/25/19 showsregionalnode metastasis but nodistant metastasis.  -It was not recommended to repeat radiation given that it was just done last year. Dr. Burr Medico recommended systemicchemotherapy to control his disease -FO showed PDL-1 10%+,mutation burden 10, HER-2 mutation(+), no other targeted therapy. Due to his history ofmyasthenia gravis,he is not a candidate for immunotherapy. -began first line CAPOX with oral Xeloda 08/2019 for 6 months, went on maintenance xeloda in 01/2020   3.Chronic back pain  -Continue toF/uwith pain specialist, on Norco -Dr. Lurlean Horns [facility] -chronic back pain and joint stiffness at baseline, denies new pain  4.Myasthenia gravis -Oncellcept 1061mBIDfor longer term immunosuppression, and mestinon TID -He is at high risk for infections due topriorchemo -Stablewith fatigue  5. Hypercalcemia,primary hyperparathyroidism -He takes Vitamin Dand multivitamin, he has been advised to avoid additional calcium supplement -06/28/2018 - PTH 110, total calcium 10.8 supporting primaryperparathyroidism. -Ca rising  lately, now 11.3  6.Goal of care discussion  -The patient understands the goal of care is palliative. -He is full code now   Disposition: Mr. CDevineappears stable.  Maintenance Xeloda remains on hold for symptomatic cholecystitis.  He continues pain management with Norco every 4 which is effective.  I encouraged him to use nutrition supplements to maintain his weight.  CBC and CMP reviewed, amylase/lipase are unremarkable.  Vital signs stable.  He does not require supportive care today.  He is scheduled for lap chole with Dr. Zenia [name] on 12/23.  We will see him in 2-3 weeks to resume maintenance treatment if he has recovered well.  All questions were answered. The patient knows to call the clinic with any problems, questions or concerns. No barriers to learning were detected.     Philip Feeling, NP 04/21/20

## 2020-04-20 ENCOUNTER — Other Ambulatory Visit (HOSPITAL_COMMUNITY)
Admission: RE | Admit: 2020-04-20 | Discharge: 2020-04-20 | Disposition: A | Discharge status: Home / Self Care | Payer: Medicare Other | Source: Ambulatory Visit | Attending: Surgery | Admitting: Surgery

## 2020-04-20 ENCOUNTER — Encounter (HOSPITAL_COMMUNITY): Payer: Self-pay

## 2020-04-20 ENCOUNTER — Other Ambulatory Visit: Payer: Medicare Other

## 2020-04-20 ENCOUNTER — Ambulatory Visit: Payer: Medicare Other | Admitting: Nurse Practitioner

## 2020-04-20 ENCOUNTER — Encounter (HOSPITAL_COMMUNITY)
Admission: RE | Admit: 2020-04-20 | Discharge: 2020-04-20 | Disposition: A | Discharge status: Home / Self Care | Payer: Medicare Other | Source: Ambulatory Visit | Attending: Surgery | Admitting: Surgery

## 2020-04-20 ENCOUNTER — Other Ambulatory Visit: Payer: Self-pay

## 2020-04-20 DIAGNOSIS — Z20822 Contact with and (suspected) exposure to covid-19: Secondary | ICD-10-CM | POA: Insufficient documentation

## 2020-04-20 DIAGNOSIS — Z01818 Encounter for other preprocedural examination: Secondary | ICD-10-CM | POA: Diagnosis not present

## 2020-04-20 LAB — CBC
HCT: 39.2 % (ref 39.0–52.0)
Hemoglobin: 13.4 g/dL (ref 13.0–17.0)
MCH: 35.1 pg — ABNORMAL HIGH (ref 26.0–34.0)
MCHC: 34.2 g/dL (ref 30.0–36.0)
MCV: 102.6 fL — ABNORMAL HIGH (ref 80.0–100.0)
Platelets: 276 10*3/uL (ref 150–400)
RBC: 3.82 MIL/uL — ABNORMAL LOW (ref 4.22–5.81)
RDW: 13.2 % (ref 11.5–15.5)
WBC: 7.7 10*3/uL (ref 4.0–10.5)
nRBC: 0 % (ref 0.0–0.2)

## 2020-04-20 LAB — SARS CORONAVIRUS 2 (TAT 6-24 HRS): SARS Coronavirus 2: NEGATIVE

## 2020-04-20 NOTE — Progress Notes (Addendum)
COVID Vaccine Completed:Yes Date COVID Vaccine completed:06/12/19 -Booster Sept 2021 COVID vaccine manufacturer: Pfizer    PCP - Dr. Olin Hauser Cardiologist - no  Chest x-ray - no EKG - 04/20/20-chart , Epic Stress Test - 2019 ECHO - 2019- Pt doesn't remember why he had it. He thinks it was Dr. Claiborne Billings and everything was "okay" Cardiac Cath - no Pacemaker/ICD device last checked:NA  Sleep Study - yes CPAP - no can't tolerate the mask  Fasting Blood Sugar - NA Checks Blood Sugar _____ times a day  Blood Thinner Instructions:ASA /Dr. Claiborne Billings Aspirin Instructions:none Last Dose:04/20/20  Anesthesia review:   Patient denies shortness of breath, fever, cough and chest pain at PAT appointment yes  Patient verbalized understanding of instructions that were given to them at the PAT appointment. Patient was also instructed that they will need to review over the PAT instructions again at home before surgery. Yes. Pt reports that his myasthenia Gravis makes him SOB but as long as he takes his meds there is no problem.BMI 37.03. he doesn't use a c-pap and is tired all the time. He doesn't have anyone to stay with him after surgery.

## 2020-04-21 ENCOUNTER — Inpatient Hospital Stay (HOSPITAL_BASED_OUTPATIENT_CLINIC_OR_DEPARTMENT_OTHER): Payer: Medicare Other | Admitting: Nurse Practitioner

## 2020-04-21 ENCOUNTER — Inpatient Hospital Stay: Payer: Medicare Other

## 2020-04-21 ENCOUNTER — Other Ambulatory Visit: Payer: Self-pay

## 2020-04-21 ENCOUNTER — Encounter: Payer: Self-pay | Admitting: Nurse Practitioner

## 2020-04-21 VITALS — BP 128/68 | HR 88 | Temp 98.8°F | Resp 17 | Ht 72.0 in | Wt 277.1 lb

## 2020-04-21 DIAGNOSIS — Q438 Other specified congenital malformations of intestine: Secondary | ICD-10-CM | POA: Diagnosis not present

## 2020-04-21 DIAGNOSIS — K858 Other acute pancreatitis without necrosis or infection: Secondary | ICD-10-CM | POA: Diagnosis not present

## 2020-04-21 DIAGNOSIS — M549 Dorsalgia, unspecified: Secondary | ICD-10-CM | POA: Diagnosis not present

## 2020-04-21 DIAGNOSIS — C160 Malignant neoplasm of cardia: Secondary | ICD-10-CM | POA: Diagnosis present

## 2020-04-21 DIAGNOSIS — G8929 Other chronic pain: Secondary | ICD-10-CM | POA: Diagnosis not present

## 2020-04-21 DIAGNOSIS — Z79899 Other long term (current) drug therapy: Secondary | ICD-10-CM | POA: Diagnosis not present

## 2020-04-21 DIAGNOSIS — G7000 Myasthenia gravis without (acute) exacerbation: Secondary | ICD-10-CM | POA: Diagnosis not present

## 2020-04-21 DIAGNOSIS — K859 Acute pancreatitis without necrosis or infection, unspecified: Secondary | ICD-10-CM | POA: Diagnosis present

## 2020-04-21 LAB — CMP (CANCER CENTER ONLY)
ALT: 23 U/L (ref 0–44)
AST: 27 U/L (ref 15–41)
Albumin: 3.8 g/dL (ref 3.5–5.0)
Alkaline Phosphatase: 99 U/L (ref 38–126)
Anion gap: 7 (ref 5–15)
BUN: 20 mg/dL (ref 8–23)
CO2: 20 mmol/L — ABNORMAL LOW (ref 22–32)
Calcium: 11.1 mg/dL — ABNORMAL HIGH (ref 8.9–10.3)
Chloride: 105 mmol/L (ref 98–111)
Creatinine: 0.84 mg/dL (ref 0.61–1.24)
GFR, Estimated: >60 mL/min (ref >60)
Glucose, Bld: 137 mg/dL — ABNORMAL HIGH (ref 70–99)
Potassium: 4.7 mmol/L (ref 3.5–5.1)
Sodium: 132 mmol/L — ABNORMAL LOW (ref 135–145)
Total Bilirubin: 1.1 mg/dL (ref 0.3–1.2)
Total Protein: 6.5 g/dL (ref 6.5–8.1)

## 2020-04-21 LAB — CBC WITH DIFFERENTIAL (CANCER CENTER ONLY)
Abs Immature Granulocytes: 0.03 10*3/uL (ref 0.00–0.07)
Basophils Absolute: 0.1 10*3/uL (ref 0.0–0.1)
Basophils Relative: 1 %
Eosinophils Absolute: 0.1 10*3/uL (ref 0.0–0.5)
Eosinophils Relative: 2 %
HCT: 36.9 % — ABNORMAL LOW (ref 39.0–52.0)
Hemoglobin: 12.8 g/dL — ABNORMAL LOW (ref 13.0–17.0)
Immature Granulocytes: 1 %
Lymphocytes Relative: 11 %
Lymphs Abs: 0.7 10*3/uL (ref 0.7–4.0)
MCH: 35.1 pg — ABNORMAL HIGH (ref 26.0–34.0)
MCHC: 34.7 g/dL (ref 30.0–36.0)
MCV: 101.1 fL — ABNORMAL HIGH (ref 80.0–100.0)
Monocytes Absolute: 0.8 10*3/uL (ref 0.1–1.0)
Monocytes Relative: 14 %
Neutro Abs: 4.3 10*3/uL (ref 1.7–7.7)
Neutrophils Relative %: 71 %
Platelet Count: 240 10*3/uL (ref 150–400)
RBC: 3.65 MIL/uL — ABNORMAL LOW (ref 4.22–5.81)
RDW: 13.2 % (ref 11.5–15.5)
WBC Count: 6 10*3/uL (ref 4.0–10.5)
nRBC: 0 % (ref 0.0–0.2)

## 2020-04-21 LAB — AMYLASE: Amylase: 21 U/L — ABNORMAL LOW (ref 28–100)

## 2020-04-21 LAB — LIPASE, BLOOD: Lipase: 35 U/L (ref 11–51)

## 2020-04-21 NOTE — Progress Notes (Signed)
Anesthesia Chart Review   Case: [id number] Date/Time: 04/23/20 1015   Procedure: LAPAROSCOPIC CHOLECYSTECTOMY WITH INTRAOPERATIVE CHOLANGIOGRAM (N/A )   Anesthesia type: General   Pre-op diagnosis: SYMPTOMATIC CHOLELITHIASIS   Location: [facility] ROOM 07 / [facility]   Surgeons: [name], MD      DISCUSSION:74 y.o. former smoker with h/o HTN, sleep apnea, esophageal cancer, Myasthenia Gravis, symptomatic cholelithiasis scheduled for above procedure 04/23/20 with Dr. Michaelle Birks.    Pt last seen by oncology 04/22/20. Per OV note, "Now on maintenance Xeloda 1500 mg twice daily, 2 weeks on and 1 week offstarting 02/22/20. Given recent abdominal pain/pancreatitis, will hold Xeloda starting 03/30/20 pending lap chole by Dr. Zenia Resides."  Last seen by neurology 02/10/20. Per OV note, "UPDATE (02/10/20, VRP): Since last visit, doing about the same. MG symptoms are stable. Severity is mild. No alleviating or aggravating factors. Tolerating meds."  Anticipate pt can proceed with planned procedure barring acute status change.   VS: BP 115/64   Pulse 93   Temp 36.4 C (Oral)   Resp 20   Ht 6' (1.829 m)   Wt 123.8 kg   SpO2 98%   BMI 37.03 kg/m   PROVIDERS: [name], MD is PCP    LABS: Labs reviewed: Acceptable for surgery. (all labs ordered are listed, but only abnormal results are displayed)  Labs Reviewed  CBC - Abnormal; Notable for the following components:      Result Value   RBC 3.82 (*)    MCV 102.6 (*)    MCH 35.1 (*)    All other components within normal limits     IMAGES:   EKG: 04/20/20 Rate 87 bpm Normal sinus rhythm Left axis deviation RSR' or QR pattern in V1 suggests right ventricular conduction delay Abnormal ECG No significant change since last tracing   CV: Echo 02/27/18 Study Conclusions   - Left ventricle: The cavity size was normal. Wall thickness was  increased in a pattern of moderate LVH. Systolic function was  normal. The estimated  ejection fraction was in the range of 60%  to 65%. Wall motion was normal; there were no regional wall  motion abnormalities. Doppler parameters are consistent with  abnormal left ventricular relaxation (grade 1 diastolic  dysfunction).   Impressions:   - The echo is technically difficult with poor images.  The valves are not seen well.  Past Medical History:  Diagnosis Date  . Abdominal hernia   . Asthma    mild  . Barrett's esophagus   . Cancer (HCC)    Esophagel cancer  . Cataract    Bil surgery  . Cough    over 2 weeks  . Fluid retention in legs   . GERD (gastroesophageal reflux disease)   . H/O hiatal hernia   . Hypertension   . Myasthenia gravis ([place])   . Sleep apnea    don't use c-pap at home/ tried for several years  . SOB (shortness of breath)     Past Surgical History:  Procedure Laterality Date  . BACK SURGERY     5th lumbar  . BALLOON DILATION N/A 10/19/2016   Procedure: BALLOON DILATION;  Surgeon: Ladene Artist, MD;  Location: [facility];  Service: Endoscopy;  Laterality: N/A;  . COLONOSCOPY W/ POLYPECTOMY    . ESOPHAGOGASTRODUODENOSCOPY N/A 10/19/2016   Procedure: ESOPHAGOGASTRODUODENOSCOPY (EGD);  Surgeon: Ladene Artist, MD;  Location: [facility];  Service: Endoscopy;  Laterality: N/A;  . ESOPHAGOGASTRODUODENOSCOPY (EGD) WITH PROPOFOL N/A 02/12/2018  Procedure: ESOPHAGOGASTRODUODENOSCOPY (EGD) WITH PROPOFOL;  Surgeon: Meridee Score Netty Starring., MD;  Location: [facility];  Service: Gastroenterology;  Laterality: N/A;  . EUS N/A 02/12/2018   Procedure: UPPER ENDOSCOPIC ULTRASOUND (EUS) RADIAL;  Surgeon: Meridee Score Netty Starring., MD;  Location: [facility] END[facility]COPY;  Service: Gastroenterology;  Laterality: N/A;  . EYE SURGERY Bilateral    cataract  . LUMBAR LAMINECTOMY     1980's    MEDICATIONS: . albuterol (VENTOLIN HFA) 108 (90 Base) MCG/ACT inhaler  . amitriptyline (ELAVIL) 50 MG tablet  . aspirin EC 81 MG tablet  . atorvastatin  (LIPITOR) 10 MG tablet  . ciprofloxacin (CIPRO) 500 MG tablet  . dicyclomine (BENTYL) 10 MG capsule  . etodolac (LODINE) 200 MG capsule  . famotidine (PEPCID) 20 MG tablet  . HYDROcodone-acetaminophen (NORCO) 7.5-325 MG tablet  . lisinopril (ZESTRIL) 20 MG tablet  . Multiple Vitamin (MULTIVITAMIN) tablet  . mycophenolate (CELLCEPT) 500 MG tablet  . ondansetron (ZOFRAN) 8 MG tablet  . prochlorperazine (COMPAZINE) 10 MG tablet  . pyridostigmine (MESTINON) 60 MG tablet  . XELODA 500 MG tablet   . 0.9 %  sodium chloride infusion    Jodell Cipro, PA-C [facility] [phone]

## 2020-04-22 ENCOUNTER — Telehealth: Payer: Self-pay | Admitting: Hematology

## 2020-04-22 MED ORDER — DEXTROSE 5 % IV SOLN
3.0000 g | INTRAVENOUS | Status: AC
  Administered 2020-04-23: 11:00:00 3 g via INTRAVENOUS
  Filled 2020-04-22: qty 3

## 2020-04-22 NOTE — Telephone Encounter (Signed)
Scheduled appointments per 12/21 los. Spoke to patient who is aware of appointments dates and times.

## 2020-04-23 ENCOUNTER — Encounter (HOSPITAL_COMMUNITY): Payer: Self-pay | Admitting: Surgery

## 2020-04-23 ENCOUNTER — Encounter (HOSPITAL_COMMUNITY)
Admission: RE | Disposition: A | Discharge status: Home / Self Care | Payer: Self-pay | Source: Home / Self Care | Attending: Surgery

## 2020-04-23 ENCOUNTER — Ambulatory Visit (HOSPITAL_COMMUNITY): Payer: Medicare Other | Admitting: Anesthesiology

## 2020-04-23 ENCOUNTER — Other Ambulatory Visit: Payer: Self-pay

## 2020-04-23 ENCOUNTER — Ambulatory Visit (HOSPITAL_COMMUNITY): Payer: Medicare Other

## 2020-04-23 ENCOUNTER — Ambulatory Visit (HOSPITAL_COMMUNITY): Payer: Medicare Other | Admitting: Physician Assistant

## 2020-04-23 ENCOUNTER — Observation Stay (HOSPITAL_COMMUNITY)
Admission: RE | Admit: 2020-04-23 | Discharge: 2020-04-24 | Disposition: A | Discharge status: Home / Self Care | Payer: Medicare Other | Attending: Surgery | Admitting: Surgery

## 2020-04-23 DIAGNOSIS — K8020 Calculus of gallbladder without cholecystitis without obstruction: Secondary | ICD-10-CM | POA: Diagnosis present

## 2020-04-23 DIAGNOSIS — K8010 Calculus of gallbladder with chronic cholecystitis without obstruction: Principal | ICD-10-CM | POA: Insufficient documentation

## 2020-04-23 DIAGNOSIS — Z79899 Other long term (current) drug therapy: Secondary | ICD-10-CM | POA: Insufficient documentation

## 2020-04-23 DIAGNOSIS — Z419 Encounter for procedure for purposes other than remedying health state, unspecified: Secondary | ICD-10-CM

## 2020-04-23 DIAGNOSIS — C159 Malignant neoplasm of esophagus, unspecified: Secondary | ICD-10-CM | POA: Diagnosis not present

## 2020-04-23 DIAGNOSIS — Z7982 Long term (current) use of aspirin: Secondary | ICD-10-CM | POA: Diagnosis not present

## 2020-04-23 HISTORY — PX: CHOLECYSTECTOMY: SHX55

## 2020-04-23 SURGERY — LAPAROSCOPIC CHOLECYSTECTOMY WITH INTRAOPERATIVE CHOLANGIOGRAM
Anesthesia: General

## 2020-04-23 MED ORDER — HYDROCODONE-ACETAMINOPHEN 7.5-325 MG PO TABS: 1.0000 | ORAL_TABLET | Freq: Four times a day (QID) | ORAL | 0 refills | Status: DC | PRN

## 2020-04-23 MED ORDER — ETODOLAC 200 MG PO CAPS
200.0000 mg | ORAL_CAPSULE | Freq: Three times a day (TID) | ORAL | Status: DC | PRN
  Filled 2020-04-23: qty 1

## 2020-04-23 MED ORDER — PHENYLEPHRINE 40 MCG/ML (10ML) SYRINGE FOR IV PUSH (FOR BLOOD PRESSURE SUPPORT)
PREFILLED_SYRINGE | INTRAVENOUS | Status: AC
  Filled 2020-04-23: qty 10

## 2020-04-23 MED ORDER — BUPIVACAINE-EPINEPHRINE 0.25% -1:200000 IJ SOLN
INTRAMUSCULAR | Status: DC | PRN
  Administered 2020-04-23: 28 mL

## 2020-04-23 MED ORDER — SUGAMMADEX SODIUM 500 MG/5ML IV SOLN
INTRAVENOUS | Status: DC | PRN
  Administered 2020-04-23: 300 mg via INTRAVENOUS

## 2020-04-23 MED ORDER — PROPOFOL 10 MG/ML IV BOLUS
INTRAVENOUS | Status: DC | PRN
  Administered 2020-04-23: 200 mg via INTRAVENOUS

## 2020-04-23 MED ORDER — IOHEXOL 300 MG/ML  SOLN
INTRAMUSCULAR | Status: DC | PRN
Start: 2020-04-23 — End: 2020-04-23
  Administered 2020-04-23: 11:00:00 5 mL

## 2020-04-23 MED ORDER — ADULT MULTIVITAMIN W/MINERALS CH
1.0000 | ORAL_TABLET | Freq: Every day | ORAL | Status: DC
  Administered 2020-04-24: 09:00:00 1 via ORAL
  Filled 2020-04-23: qty 1

## 2020-04-23 MED ORDER — LISINOPRIL 20 MG PO TABS
20.0000 mg | ORAL_TABLET | Freq: Every day | ORAL | Status: DC
  Administered 2020-04-24: 09:00:00 20 mg via ORAL
  Filled 2020-04-23: qty 1

## 2020-04-23 MED ORDER — SUGAMMADEX SODIUM 500 MG/5ML IV SOLN: INTRAVENOUS | Status: DC | PRN

## 2020-04-23 MED ORDER — BUPIVACAINE-EPINEPHRINE 0.25% -1:200000 IJ SOLN
INTRAMUSCULAR | Status: AC
  Filled 2020-04-23: qty 1

## 2020-04-23 MED ORDER — ONDANSETRON HCL 4 MG/2ML IJ SOLN
INTRAMUSCULAR | Status: DC | PRN
  Administered 2020-04-23: 4 mg via INTRAVENOUS

## 2020-04-23 MED ORDER — ATORVASTATIN CALCIUM 10 MG PO TABS
10.0000 mg | ORAL_TABLET | Freq: Every day | ORAL | Status: DC
  Administered 2020-04-23: 23:00:00 10 mg via ORAL
  Filled 2020-04-23: qty 1

## 2020-04-23 MED ORDER — ALBUTEROL SULFATE HFA 108 (90 BASE) MCG/ACT IN AERS: 2.0000 | INHALATION_SPRAY | RESPIRATORY_TRACT | Status: DC | PRN

## 2020-04-23 MED ORDER — HYDROCODONE-ACETAMINOPHEN 7.5-325 MG PO TABS
1.0000 | ORAL_TABLET | Freq: Four times a day (QID) | ORAL | Status: DC | PRN
  Administered 2020-04-23 – 2020-04-24 (×4): 1 via ORAL
  Filled 2020-04-23 (×5): qty 1

## 2020-04-23 MED ORDER — DOCUSATE SODIUM 100 MG PO CAPS
100.0000 mg | ORAL_CAPSULE | Freq: Two times a day (BID) | ORAL | Status: DC
  Administered 2020-04-23 – 2020-04-24 (×2): 100 mg via ORAL
  Filled 2020-04-23 (×2): qty 1

## 2020-04-23 MED ORDER — GABAPENTIN 300 MG PO CAPS
300.0000 mg | ORAL_CAPSULE | ORAL | Status: AC
  Administered 2020-04-23: 08:00:00 300 mg via ORAL
  Filled 2020-04-23: qty 1

## 2020-04-23 MED ORDER — SUGAMMADEX SODIUM 500 MG/5ML IV SOLN
INTRAVENOUS | Status: AC
  Filled 2020-04-23: qty 5

## 2020-04-23 MED ORDER — FENTANYL CITRATE (PF) 250 MCG/5ML IJ SOLN
INTRAMUSCULAR | Status: DC | PRN
  Administered 2020-04-23: 50 ug via INTRAVENOUS
  Administered 2020-04-23: 100 ug via INTRAVENOUS
  Administered 2020-04-23 (×2): 50 ug via INTRAVENOUS

## 2020-04-23 MED ORDER — DOCUSATE SODIUM 100 MG PO CAPS: 100.0000 mg | ORAL_CAPSULE | Freq: Two times a day (BID) | ORAL | 0 refills | Status: AC

## 2020-04-23 MED ORDER — ROCURONIUM BROMIDE 10 MG/ML (PF) SYRINGE
PREFILLED_SYRINGE | INTRAVENOUS | Status: AC
  Filled 2020-04-23: qty 10

## 2020-04-23 MED ORDER — PYRIDOSTIGMINE BROMIDE 60 MG PO TABS
60.0000 mg | ORAL_TABLET | Freq: Three times a day (TID) | ORAL | Status: DC
  Administered 2020-04-23 – 2020-04-24 (×3): 60 mg via ORAL
  Filled 2020-04-23 (×4): qty 1

## 2020-04-23 MED ORDER — ONDANSETRON HCL 4 MG/2ML IJ SOLN: 4.0000 mg | Freq: Once | INTRAMUSCULAR | Status: DC | PRN

## 2020-04-23 MED ORDER — AMITRIPTYLINE HCL 50 MG PO TABS
50.0000 mg | ORAL_TABLET | Freq: Every day | ORAL | Status: DC
  Administered 2020-04-23: 23:00:00 50 mg via ORAL
  Filled 2020-04-23: qty 1

## 2020-04-23 MED ORDER — PHENYLEPHRINE 40 MCG/ML (10ML) SYRINGE FOR IV PUSH (FOR BLOOD PRESSURE SUPPORT)
PREFILLED_SYRINGE | INTRAVENOUS | Status: DC | PRN
  Administered 2020-04-23 (×2): 80 ug via INTRAVENOUS

## 2020-04-23 MED ORDER — ONDANSETRON 4 MG PO TBDP: 4.0000 mg | ORAL_TABLET | Freq: Four times a day (QID) | ORAL | Status: DC | PRN

## 2020-04-23 MED ORDER — CHLORHEXIDINE GLUCONATE 0.12 % MT SOLN: 15.0000 mL | Freq: Once | OROMUCOSAL | Status: AC

## 2020-04-23 MED ORDER — OXYCODONE HCL 5 MG PO TABS
5.0000 mg | ORAL_TABLET | Freq: Once | ORAL | Status: DC | PRN
Start: 2020-04-23 — End: 2020-04-23

## 2020-04-23 MED ORDER — PYRIDOSTIGMINE BROMIDE 60 MG PO TABS
60.0000 mg | ORAL_TABLET | Freq: Once | ORAL | Status: AC
  Administered 2020-04-23: 09:00:00 60 mg via ORAL
  Filled 2020-04-23: qty 1

## 2020-04-23 MED ORDER — LIDOCAINE HCL (PF) 2 % IJ SOLN
INTRAMUSCULAR | Status: AC
  Filled 2020-04-23: qty 5

## 2020-04-23 MED ORDER — HYDROMORPHONE HCL 1 MG/ML IJ SOLN
0.2500 mg | INTRAMUSCULAR | Status: DC | PRN
Start: 2020-04-23 — End: 2020-04-23

## 2020-04-23 MED ORDER — ENOXAPARIN SODIUM 40 MG/0.4ML ~~LOC~~ SOLN
40.0000 mg | SUBCUTANEOUS | Status: DC
  Administered 2020-04-24: 06:00:00 40 mg via SUBCUTANEOUS
  Filled 2020-04-23: qty 0.4

## 2020-04-23 MED ORDER — ORAL CARE MOUTH RINSE
15.0000 mL | Freq: Once | OROMUCOSAL | Status: AC
  Administered 2020-04-23: 08:00:00 15 mL via OROMUCOSAL

## 2020-04-23 MED ORDER — FAMOTIDINE 20 MG PO TABS
20.0000 mg | ORAL_TABLET | Freq: Two times a day (BID) | ORAL | Status: DC
  Administered 2020-04-23 – 2020-04-24 (×2): 20 mg via ORAL
  Filled 2020-04-23 (×2): qty 1

## 2020-04-23 MED ORDER — ACETAMINOPHEN 500 MG PO TABS
1000.0000 mg | ORAL_TABLET | ORAL | Status: AC
  Administered 2020-04-23: 08:00:00 1000 mg via ORAL
  Filled 2020-04-23: qty 2

## 2020-04-23 MED ORDER — OXYCODONE HCL 5 MG/5ML PO SOLN: 5.0000 mg | Freq: Once | ORAL | Status: DC | PRN

## 2020-04-23 MED ORDER — PROPOFOL 10 MG/ML IV BOLUS
INTRAVENOUS | Status: AC
  Filled 2020-04-23: qty 20

## 2020-04-23 MED ORDER — SUCCINYLCHOLINE CHLORIDE 200 MG/10ML IV SOSY
PREFILLED_SYRINGE | INTRAVENOUS | Status: AC
  Filled 2020-04-23: qty 10

## 2020-04-23 MED ORDER — LIDOCAINE 2% (20 MG/ML) 5 ML SYRINGE
INTRAMUSCULAR | Status: DC | PRN
  Administered 2020-04-23: 60 mg via INTRAVENOUS

## 2020-04-23 MED ORDER — LACTATED RINGERS IR SOLN
Status: DC | PRN
  Administered 2020-04-23: 1000 mL

## 2020-04-23 MED ORDER — MYCOPHENOLATE MOFETIL 250 MG PO CAPS
1000.0000 mg | ORAL_CAPSULE | Freq: Two times a day (BID) | ORAL | Status: DC
  Administered 2020-04-23 – 2020-04-24 (×2): 1000 mg via ORAL
  Filled 2020-04-23 (×2): qty 4

## 2020-04-23 MED ORDER — 0.9 % SODIUM CHLORIDE (POUR BTL) OPTIME
TOPICAL | Status: DC | PRN
  Administered 2020-04-23: 12:00:00 1000 mL

## 2020-04-23 MED ORDER — ASPIRIN EC 81 MG PO TBEC
81.0000 mg | DELAYED_RELEASE_TABLET | Freq: Every day | ORAL | Status: DC
  Administered 2020-04-24: 09:00:00 81 mg via ORAL
  Filled 2020-04-23: qty 1

## 2020-04-23 MED ORDER — LACTATED RINGERS IV SOLN: INTRAVENOUS | Status: DC

## 2020-04-23 MED ORDER — ONDANSETRON HCL 4 MG/2ML IJ SOLN: 4.0000 mg | Freq: Four times a day (QID) | INTRAMUSCULAR | Status: DC | PRN

## 2020-04-23 MED ORDER — FENTANYL CITRATE (PF) 250 MCG/5ML IJ SOLN
INTRAMUSCULAR | Status: AC
  Filled 2020-04-23: qty 5

## 2020-04-23 MED ORDER — ROCURONIUM BROMIDE 10 MG/ML (PF) SYRINGE
PREFILLED_SYRINGE | INTRAVENOUS | Status: DC | PRN
  Administered 2020-04-23: 30 mg via INTRAVENOUS
  Administered 2020-04-23: 10 mg via INTRAVENOUS
  Administered 2020-04-23: 20 mg via INTRAVENOUS

## 2020-04-23 SURGICAL SUPPLY — 59 items
ADH SKN CLS APL DERMABOND .7 (GAUZE/BANDAGES/DRESSINGS) ×1
APL PRP STRL LF DISP 70% ISPRP (MISCELLANEOUS) ×1
APL SWBSTK 6 STRL LF DISP (MISCELLANEOUS)
APPLICATOR COTTON TIP 6 STRL (MISCELLANEOUS) IMPLANT
APPLICATOR COTTON TIP 6IN STRL (MISCELLANEOUS)
APPLIER CLIP 5 13 M/L LIGAMAX5 (MISCELLANEOUS) ×3
APR CLP MED LRG 5 ANG JAW (MISCELLANEOUS) ×1
BAG SPEC RTRVL LRG 6X4 10 (ENDOMECHANICALS) ×1
BLADE SURG 15 STRL LF DISP TIS (BLADE) ×1 IMPLANT
BLADE SURG 15 STRL SS (BLADE) ×3
BLADE SURG SZ11 CARB STEEL (BLADE) ×3 IMPLANT
CABLE HIGH FREQUENCY MONO STRZ (ELECTRODE) IMPLANT
CHLORAPREP W/TINT 26 (MISCELLANEOUS) ×3 IMPLANT
CLIP APPLIE 5 13 M/L LIGAMAX5 (MISCELLANEOUS) ×1 IMPLANT
COVER MAYO STAND STRL (DRAPES) IMPLANT
COVER SURGICAL LIGHT HANDLE (MISCELLANEOUS) ×3 IMPLANT
COVER WAND RF STERILE (DRAPES) IMPLANT
DECANTER SPIKE VIAL GLASS SM (MISCELLANEOUS) ×3 IMPLANT
DERMABOND ADVANCED (GAUZE/BANDAGES/DRESSINGS) ×2
DERMABOND ADVANCED .7 DNX12 (GAUZE/BANDAGES/DRESSINGS) ×1 IMPLANT
DRAPE C-ARM 42X120 X-RAY (DRAPES) IMPLANT
DRAPE UTILITY XL STRL (DRAPES) ×3 IMPLANT
ELECT L-HOOK LAP 45CM DISP (ELECTROSURGICAL)
ELECT PENCIL ROCKER SW 15FT (MISCELLANEOUS) ×3 IMPLANT
ELECT REM PT RETURN 15FT ADLT (MISCELLANEOUS) ×3 IMPLANT
ELECTRODE L-HOOK LAP 45CM DISP (ELECTROSURGICAL) IMPLANT
GAUZE 4X4 16PLY RFD (DISPOSABLE) ×3 IMPLANT
GLOVE BIOGEL PI IND STRL 6 (GLOVE) ×1 IMPLANT
GLOVE BIOGEL PI INDICATOR 6 (GLOVE) ×2
GLOVE SURG POLYISO LF SZ5.5 (GLOVE) ×3 IMPLANT
GOWN STRL REUS W/TWL LRG LVL3 (GOWN DISPOSABLE) ×3 IMPLANT
GOWN STRL REUS W/TWL XL LVL3 (GOWN DISPOSABLE) ×6 IMPLANT
GRASPER SUT TROCAR 14GX15 (MISCELLANEOUS) IMPLANT
HEMOSTAT SNOW SURGICEL 2X4 (HEMOSTASIS) IMPLANT
KIT BASIN OR (CUSTOM PROCEDURE TRAY) ×3 IMPLANT
KIT TURNOVER KIT A (KITS) IMPLANT
L-HOOK LAP DISP 36CM (ELECTROSURGICAL) ×3
LHOOK LAP DISP 36CM (ELECTROSURGICAL) ×1 IMPLANT
NDL INSUFFLATION 14GA 120MM (NEEDLE) IMPLANT
NEEDLE HYPO 22GX1.5 SAFETY (NEEDLE) ×3 IMPLANT
NEEDLE INSUFFLATION 14GA 120MM (NEEDLE) IMPLANT
PACK UNIVERSAL I (CUSTOM PROCEDURE TRAY) ×3 IMPLANT
POUCH SPECIMEN RETRIEVAL 10MM (ENDOMECHANICALS) ×3 IMPLANT
SCISSORS LAP 5X35 DISP (ENDOMECHANICALS) ×3 IMPLANT
SET CHOLANGIOGRAPH MIX (MISCELLANEOUS) IMPLANT
SET IRRIG TUBING LAPAROSCOPIC (IRRIGATION / IRRIGATOR) ×3 IMPLANT
SET TUBE SMOKE EVAC HIGH FLOW (TUBING) ×3 IMPLANT
SLEEVE XCEL OPT CAN 5 100 (ENDOMECHANICALS) ×6 IMPLANT
SOL ANTI FOG 6CC (MISCELLANEOUS) ×1 IMPLANT
SOLUTION ANTI FOG 6CC (MISCELLANEOUS) ×2
SUT MNCRL AB 4-0 PS2 18 (SUTURE) ×3 IMPLANT
SUT VICRYL 0 UR6 27IN ABS (SUTURE) ×3 IMPLANT
SYR 20ML LL LF (SYRINGE) IMPLANT
SYR CONTROL 10ML LL (SYRINGE) ×3 IMPLANT
TOWEL OR 17X26 10 PK STRL BLUE (TOWEL DISPOSABLE) ×3 IMPLANT
TOWEL OR NON WOVEN STRL DISP B (DISPOSABLE) IMPLANT
TROCAR BLADELESS OPT 5 100 (ENDOMECHANICALS) ×3 IMPLANT
TROCAR XCEL 12X100 BLDLESS (ENDOMECHANICALS) IMPLANT
TROCAR XCEL BLUNT TIP 100MML (ENDOMECHANICALS) IMPLANT

## 2020-04-23 NOTE — Op Note (Signed)
Date: 04/23/20  Patient: Philip [name] MRN: [id number]  Preoperative Diagnosis: Symptomatic cholelithiasis, gallstone pancreatitis Postoperative Diagnosis: Same  Procedure: Laparoscopic cholecystectomy with intraoperative cholangiogram  Surgeon: Michaelle Birks, MD Assistant: Greer Pickerel, MD  EBL: Minimal  Anesthesia: General  Specimens: Gallbladder  Indications: Philip [name] is a 74 yo male with esophageal cancer for which he is currently being treated with Xeloda. He presented with epigastric abdominal pain and CT scan showed acute pancreatitis. RUQ US showed cholelithiasis. The patient has recovered from pancreatitis but has persistent postprandial RUQ abdominal pain. After a discussion of the risks and benefits of surgery, cholecystectomy was recommended to reduce the risk of recurrent pancreatitis (which is presumably biliary in etiology) and to relieve his biliary colic.  Findings: Chronic cholecystitis. Normal cholangiogram. Nodular appearance of the liver consistent with cirrhosis.  Procedure details: Informed consent was obtained in the preoperative area prior to the procedure. The patient was brought to the operating room and placed on the table in the supine position. General anesthesia was induced and appropriate lines and drains were placed for intraoperative monitoring. Perioperative antibiotics were administered per SCIP guidelines. The abdomen was prepped and draped in the usual sterile fashion. A pre-procedure timeout was taken verifying patient identity, surgical site and procedure to be performed.  A small infraumbilical skin incision was made, the subcutaneous tissue was divided with cautery, and the umbilical stalk was grasped and elevated. The fascia was incised and the peritoneal cavity was directly visualized. A 84mm Hassan trocar was placed. The peritoneal cavity was inspected with no evidence of visceral or vascular injury. The liver was diffusely nodular in appearance,  consistent with cirrhosis. Three 3mm ports were placed in the right subcostal margin, all under direct visualization. The fundus of the gallbladder was grasped and retracted cephalad. Omental adhesions to the gallbladder were carefully taken down using cautery and blunt dissection. The infundibulum was retracted laterally. The cystic triangle was dissected out using cautery and blunt dissection, and the critical view of safety was obtained. The cystic duct was clipped near the gallbladder, and a cystic ductotomy was created sharply. A stab incision was made in the RUQ and a cholangiocatheter was passed through the abdominal wall. The cholangiocatheter was used to cannulate the cystic duct and was secured with a clip. A cholangiogram was then performed under fluoroscopy. The entire common bile duct and common hepatic duct filled, as well as the bifurcation and left and right hepatic ducts. Contrast was visualized filling the duodenum. There were no filling defects anywhere in the bile duct and it was normal in caliber. The cholangiocatheter was removed and two clips were placed on the cystic duct stump proximal to the ductotomy. The duct was then completely divided. The cystic artery was clipped and ligated. The gallbladder was taken off the liver using cautery. The specimen was placed in an endocatch bag and removed. The surgical site was irrigated with saline until the effluent was clear. Hemostasis was achieved in the gallbladder fossa using cautery. The cystic duct and artery stumps were visually inspected and there was no evidence of bile leak or bleeding. The ports were removed under direct visualization and the abdomen was desufflated. The umbilical port site fascia was closed with a 0 vicryl suture. The skin at all port sites was closed with 4-0 monocryl subcuticular suture. Dermabond was applied.  The patient tolerated the procedure well with no apparent complications. All counts were correct x2 at the end  of the procedure. The patient was extubated and  taken to PACU in stable condition.  Sophronia Simas, MD 04/23/20 12:11 PM

## 2020-04-23 NOTE — Anesthesia Postprocedure Evaluation (Signed)
Anesthesia Post Note  Patient: Philip [name]  Procedure(s) Performed: LAPAROSCOPIC CHOLECYSTECTOMY WITH INTRAOPERATIVE CHOLANGIOGRAM (N/A )     Patient location during evaluation: PACU Anesthesia Type: General Level of consciousness: awake and alert and oriented Pain management: pain level controlled Vital Signs Assessment: post-procedure vital signs reviewed and stable Respiratory status: spontaneous breathing, nonlabored ventilation, respiratory function stable and patient connected to nasal cannula oxygen Cardiovascular status: blood pressure returned to baseline and stable Postop Assessment: no apparent nausea or vomiting Anesthetic complications: no   No complications documented.  Last Vitals:  Vitals:   04/23/20 1230 04/23/20 1236  BP: 122/60   Pulse: 88 91  Resp: 15 13  Temp: 36.4 C   SpO2: 99% 100%    Last Pain:  Vitals:   04/23/20 1236  TempSrc:   PainSc: 0-No pain                 Therese Rocco A.

## 2020-04-23 NOTE — Interval H&P Note (Signed)
History and Physical Interval Note:  04/23/2020 10:31 AM  Philip [name]  has presented today for surgery, with the diagnosis of SYMPTOMATIC CHOLELITHIASIS.  The various methods of treatment have been discussed with the patient and family. After consideration of risks, benefits and other options for treatment, the patient has consented to  Procedure(s): LAPAROSCOPIC CHOLECYSTECTOMY WITH INTRAOPERATIVE CHOLANGIOGRAM (N/A) as a surgical intervention.  The patient's history has been reviewed, patient examined, no change in status, stable for surgery.  I have reviewed the patient's chart and labs.  Questions were answered to the patient's satisfaction.     [name]

## 2020-04-23 NOTE — Discharge Instructions (Signed)
[facility] DISCHARGE INSTRUCTIONS  Activity . No heavy lifting greater than 10 pounds for 4 weeks after surgery. Madaline Brilliant to shower, but do not bathe or submerge incisions underwater. . Do not drive while taking narcotic pain medication.  Wound Care . Your incisions are covered with skin glue called Dermabond. This will peel off on its own over time. . You may shower and allow warm soapy water to run over your incisions. Gently pat dry. . Do not submerge your incision underwater. . Monitor your incision for any new redness, tenderness, or drainage.  When to Call us: [name] Fever greater than 100.5 . New redness, drainage, or swelling at incision site . Severe pain, nausea, or vomiting . Jaundice (yellowing of the whites of the eyes or skin)  Follow-up You have an appointment scheduled with Dr. Zenia Resides on May 18, 2020 at 3:45pm. This will be at the [facility] office at [address], [place], [place], [place]. Please arrive at least 15 minutes prior to your scheduled appointment time.  For questions or concerns, please call the office at [phone].

## 2020-04-23 NOTE — Anesthesia Preprocedure Evaluation (Addendum)
Anesthesia Evaluation  Patient identified by MRN, date of birth, ID band Patient awake    Reviewed: Allergy & Precautions, NPO status , Patient's Chart, lab work & pertinent test results  Airway Mallampati: II       Dental no notable dental hx. (+) Upper Dentures, Dental Advisory Given   Pulmonary shortness of breath and with exertion, asthma , sleep apnea , former smoker,  Intolerant of CPAP   Pulmonary exam normal breath sounds clear to auscultation       Cardiovascular hypertension, Pt. on medications Normal cardiovascular exam Rhythm:Regular Rate:Normal  CardioPulmonary stress test 04/2018 Low normal functional capacity when compared to matched controls. No obvious cardiac limitation. When corrected for the patient's obesity the functional capacity is normal. Resting spirometry shows moderate restriction and at peak exercise the patient is limited by his obesity and restricted ventilation. There was a hypertensive response to exercise.   EKG 04/20/20 NSR, LAD, incomplete RBBB  Echo 02/27/18 Left ventricle: The cavity size was normal. Wall thickness was increased in a pattern of moderate LVH. Systolic function was normal. The estimated ejection fraction was in the range of 60% to 65%. Wall motion was normal; there were no regional wall motion abnormalities. Doppler parameters are consistent with  abnormal left ventricular relaxation (grade 1 diastolic  dysfunction).    Neuro/Psych Myesthenia gravis- on Mestinon and Cellcept  Neuromuscular disease negative psych ROS   GI/Hepatic Neg liver ROS, hiatal hernia, GERD  Medicated and Controlled,Hx/o Esophageal Ca S/P chemoRx and RT Hx/o Barrett's esophagus Symptomatic cholelithiasis with pancreatitis   Endo/Other  Obesity Hyperlipidemia  Renal/GU Renal disease  negative genitourinary   Musculoskeletal negative musculoskeletal ROS (+)   Abdominal (+) + obese,    Peds  Hematology  (+) anemia ,   Anesthesia Other Findings   Reproductive/Obstetrics                           Anesthesia Physical Anesthesia Plan  ASA: III  Anesthesia Plan: General   Post-op Pain Management:    Induction: Intravenous, Rapid sequence and Cricoid pressure planned  PONV Risk Score and Plan: 3 and Ondansetron and Treatment may vary due to age or medical condition  Airway Management Planned: Oral ETT  Additional Equipment:   Intra-op Plan:   Post-operative Plan: Extubation in OR  Informed Consent: I have reviewed the patients History and Physical, chart, labs and discussed the procedure including the risks, benefits and alternatives for the proposed anesthesia with the patient or authorized representative who has indicated his/her understanding and acceptance.     Dental advisory given  Plan Discussed with: CRNA and Anesthesiologist  Anesthesia Plan Comments:         Anesthesia Quick Evaluation

## 2020-04-23 NOTE — Anesthesia Procedure Notes (Signed)
Procedure Name: Intubation Date/Time: 04/23/2020 10:44 AM Performed by: Lollie Sails, CRNA Pre-anesthesia Checklist: Patient identified, Emergency Drugs available, Suction available, Patient being monitored and Timeout performed Patient Re-evaluated:Patient Re-evaluated prior to induction Oxygen Delivery Method: Circle system utilized Preoxygenation: Pre-oxygenation with 100% oxygen Induction Type: IV induction Ventilation: Mask ventilation without difficulty Laryngoscope Size: Miller and 3 Grade View: Grade I Tube type: Oral Tube size: 7.5 mm Number of attempts: 1 Airway Equipment and Method: Stylet Placement Confirmation: ETT inserted through vocal cords under direct vision,  positive ETCO2 and breath sounds checked- equal and bilateral Secured at: 23 cm Tube secured with: Tape Dental Injury: Teeth and Oropharynx as per pre-operative assessment

## 2020-04-23 NOTE — Transfer of Care (Signed)
Immediate Anesthesia Transfer of Care Note  Patient: Philip [name]  Procedure(s) Performed: LAPAROSCOPIC CHOLECYSTECTOMY WITH INTRAOPERATIVE CHOLANGIOGRAM (N/A )  Patient Location: PACU  Anesthesia Type:General  Level of Consciousness: awake, alert , oriented and patient cooperative  Airway & Oxygen Therapy: Patient Spontanous Breathing and Patient connected to face mask oxygen  Post-op Assessment: Report given to RN and Post -op Vital signs reviewed and stable  Post vital signs: Reviewed and stable  Last Vitals:  Vitals Value Taken Time  BP 132/76 04/23/20 1158  Temp    Pulse 86 04/23/20 1200  Resp 20 04/23/20 1200  SpO2 100 % 04/23/20 1200  Vitals shown include unvalidated device data.  Last Pain:  Vitals:   04/23/20 0828  TempSrc: Oral  PainSc:          Complications: No complications documented.

## 2020-04-24 ENCOUNTER — Encounter (HOSPITAL_COMMUNITY): Payer: Self-pay | Admitting: Surgery

## 2020-04-24 DIAGNOSIS — K8010 Calculus of gallbladder with chronic cholecystitis without obstruction: Secondary | ICD-10-CM | POA: Diagnosis not present

## 2020-04-24 NOTE — Progress Notes (Signed)
D/C instructions given. Denies questions. IV removed.

## 2020-04-24 NOTE — Discharge Summary (Signed)
Physician Discharge Summary  Philip [name] BDZ:[id number] DOB: 1945/12/27 DOA: 04/23/2020  PCP: [name], MD  Admit date: 04/23/2020 Discharge date: 04/24/2020  Recommendations for Outpatient Follow-up:     Follow-up Information    [name], MD. Schedule an appointment as soon as possible for a visit in 3 week(s).   Specialty: General Surgery Why: call for postop appt Contact information: [place]. [address] [place] [address] [phone]              Discharge Diagnoses:  1. H/o gallstone pancreatitis 2. Myasthenia gravis 3. Chronic pain on narcotics 4. Obesity 5. Esophageal cancer on xeloda   Surgical Procedure: laparoscopic cholecystectomy with ioc Dr Zenia Resides 04/23/20  Discharge Condition: good Disposition: home  Diet recommendation: low fat  Filed Weights   04/23/20 0820 04/23/20 1314  Weight: 125.7 kg 125.7 kg    History of present illness: Mr. Lombardo is a 74 yo male with esophageal cancer for which he is currently being treated with Xeloda. He presented with epigastric abdominal pain and CT scan showed acute pancreatitis. RUQ US showed cholelithiasis. The patient has recovered from pancreatitis but has persistent postprandial RUQ abdominal pain. After a discussion of the risks and benefits of surgery, cholecystectomy was recommended to reduce the risk of recurrent pancreatitis (which is presumably biliary in etiology) and to relieve his biliary colic.   Hospital Course:  Patient was brought in for planned laparoscopic cholecystectomy with IOC. Please see prior note for further details. He was kept overnight observation due to his myasthenia gravis. On postoperative day 1 he was doing well. He was tolerating a diet. His pain was controlled. He was ambulating. His vital signs were stable. Was deemed stable for discharge. We discussed discharge instructions  BP 116/72 (BP Location: Right Arm)   Pulse 85   Temp 98.4 F (36.9 C) (Oral)    Resp 16   Ht 6' (1.829 m)   Wt 125.7 kg   SpO2 99%   BMI 37.58 kg/m   Gen: alert, NAD, non-toxic appearing Pupils: equal, no scleral icterus Pulm: Lungs clear to auscultation, symmetric chest rise CV: regular rate and rhythm Abd: soft, mild approp tender, nondistended.  No cellulitis. No incisional hernia Ext: no edema, no calf tenderness Skin: no rash, no jaundice    Discharge Instructions  Discharge Instructions    Call MD for:   Complete by: As directed    Temperature >101   Call MD for:  hives   Complete by: As directed    Call MD for:  persistant dizziness or light-headedness   Complete by: As directed    Call MD for:  persistant nausea and vomiting   Complete by: As directed    Call MD for:  redness, tenderness, or signs of infection (pain, swelling, redness, odor or green/yellow discharge around incision site)   Complete by: As directed    Call MD for:  severe uncontrolled pain   Complete by: As directed    Diet - low sodium heart healthy   Complete by: As directed    Discharge instructions   Complete by: As directed    See [facility] discharge instructions   Increase activity slowly   Complete by: As directed      Allergies as of 04/24/2020   No Known Allergies     Medication List    STOP taking these medications   ciprofloxacin 500 MG tablet Commonly known as: Cipro     TAKE these medications  albuterol 108 (90 Base) MCG/ACT inhaler Commonly known as: VENTOLIN HFA Inhale 2 puffs into the lungs every 4 (four) hours as needed for wheezing or shortness of breath.   amitriptyline 50 MG tablet Commonly known as: ELAVIL Take 50 mg by mouth at bedtime.   aspirin EC 81 MG tablet Take 81 mg by mouth daily.   atorvastatin 10 MG tablet Commonly known as: LIPITOR Take 10 mg by mouth at bedtime.   dicyclomine 10 MG capsule Commonly known as: Bentyl Take 1-2 capsules (10-20 mg total) by mouth every 8 (eight) hours as needed for spasms.   docusate sodium  100 MG capsule Commonly known as: COLACE Take 1 capsule (100 mg total) by mouth 2 (two) times daily.   etodolac 200 MG capsule Commonly known as: LODINE Take 200 mg by mouth every 8 (eight) hours as needed for moderate pain.   famotidine 20 MG tablet Commonly known as: PEPCID TAKE ONE TABLET BY MOUTH TWICE A DAY   HYDROcodone-acetaminophen 7.5-325 MG tablet Commonly known as: NORCO Take 1 tablet by mouth 4 (four) times daily as needed for severe pain.   lisinopril 20 MG tablet Commonly known as: ZESTRIL Take 20 mg by mouth daily.   multivitamin tablet Take 1 tablet by mouth daily.   mycophenolate 500 MG tablet Commonly known as: CELLCEPT Take 2 tablets (1,000 mg total) by mouth 2 (two) times daily.   ondansetron 8 MG tablet Commonly known as: Zofran Take 1 tablet (8 mg total) by mouth 2 (two) times daily as needed for refractory nausea / vomiting. Start on day 3 after chemotherapy.   prochlorperazine 10 MG tablet Commonly known as: COMPAZINE Take 1 tablet (10 mg total) by mouth every 6 (six) hours as needed (Nausea or vomiting).   pyridostigmine 60 MG tablet Commonly known as: MESTINON Take 0.5-1 tablets (30-60 mg total) by mouth 3 (three) times daily.   Xeloda 500 MG tablet Generic drug: capecitabine Take 3 tablets (1,500 mg total) by mouth 2 (two) times daily after a meal. Take for 14 days, then hold for 7 days. Repeat every 21 days.       Follow-up Information    Fritzi Mandes, MD. Schedule an appointment as soon as possible for a visit in 3 week(s).   Specialty: General Surgery Why: call for postop appt Contact information: [address] [place] [address] [phone]                The results of significant diagnostics from this hospitalization (including imaging, microbiology, ancillary and laboratory) are listed below for reference.    Significant Diagnostic Studies: DG Cholangiogram Operative  Result Date:  04/23/2020 CLINICAL DATA:  Intraoperative cholangiogram during laparoscopic cholecystectomy. EXAM: INTRAOPERATIVE CHOLANGIOGRAM FLUOROSCOPY TIME:  10 seconds (7.6 mGy) COMPARISON:  Abdominal ultrasound-04/07/2020 FINDINGS: Intraoperative cholangiographic images of the right upper abdominal quadrant during laparoscopic cholecystectomy are provided for review. Surgical clips overlie the expected location of the gallbladder fossa. Contrast injection demonstrates selective cannulation of the central aspect of the cystic duct. There is passage of contrast through the central aspect of the cystic duct with filling of a non dilated common bile duct. There is passage of contrast though the CBD and into the descending portion of the duodenum. There is minimal reflux of injected contrast into the common hepatic duct and central aspect of the non dilated intrahepatic biliary system. There are no discrete filling defects within the opacified portions of the biliary system to suggest the presence of  choledocholithiasis. IMPRESSION: No evidence of choledocholithiasis. Electronically Signed   By: Sandi Mariscal M.D.   On: 04/23/2020 11:58   US Abdomen Complete  Result Date: 04/07/2020 CLINICAL DATA:  RUQ abdominal pain, acute pancreatitis. EXAM: ABDOMEN ULTRASOUND COMPLETE COMPARISON:  04/02/2020 and prior. FINDINGS: Gallbladder: Intraluminal calculi measuring up to 6.4 mm within the gallbladder neck. Prominence of the gallbladder wall. No pericholecystic free fluid. No sonographic Murphy sign noted by sonographer. Common bile duct: Diameter: 4.0 mm Liver: No focal lesion identified. Normal parenchymal echogenicity. Portal vein is patent on color Doppler imaging with normal direction of blood flow towards the liver. IVC: No abnormality visualized. Pancreas: Not well visualized secondary to overlying bowel gas. Spleen: Size and appearance within normal limits. Right Kidney: Length: 12.2 cm. Echogenicity within normal limits. No  mass or hydronephrosis visualized. Left Kidney: Length: 13.0 cm. Echogenicity within normal limits. No mass or hydronephrosis visualized. Abdominal aorta: No aneurysm visualized. Other findings: None. IMPRESSION: Cholelithiasis with nonspecific prominence of the gallbladder wall. No biliary dilatation, pericholecystic free fluid or sonographic Murphy sign. Evolving pancreatitis is not well demonstrated on this exam secondary to overlying bowel gas. Electronically Signed   By: Primitivo Gauze M.D.   On: 04/07/2020 15:23   CT Abdomen Pelvis W Contrast  Result Date: 04/02/2020 CLINICAL DATA:  Here for evaluation of abdominal pain for a couple of weeks. History of esophageal cancer diagnosed 3 years ago status post radiation and chemotherapy. EXAM: CT ABDOMEN AND PELVIS WITH CONTRAST TECHNIQUE: Multidetector CT imaging of the abdomen and pelvis was performed using the standard protocol following bolus administration of intravenous contrast. CONTRAST:  152mL OMNIPAQUE IOHEXOL 300 MG/ML  SOLN COMPARISON:  Report from CT abdomen pelvis dated 02/11/2020. FINDINGS: Lower chest: The heart is enlarged. Hepatobiliary: A 6 mm cyst is seen in the liver (series 2, image 22). There is a possible 3 mm gallstone in the gallbladder neck (series 2, image 29). No gallbladder wall thickening or biliary dilatation. Pancreas: There are moderate inflammatory changes surrounding the body and head of the pancreas. The pancreatic parenchyma enhances some margin asleep. The pancreatic duct appears normal. There is no well-defined acute peripancreatic fluid collection. Spleen: Normal in size without focal abnormality. Adrenals/Urinary Tract: Adrenal glands are unremarkable. Kidneys are normal, without renal calculi, focal lesion, or hydronephrosis. Bladder is unremarkable. Stomach/Bowel: The visible portions of the distal esophagus and stomach are within normal limits. A lipoma is seen in the inferior genu of the duodenum without  evidence of obstruction. Appendix appears normal. There is colonic diverticulosis without evidence of diverticulitis. No evidence of bowel wall thickening, distention, or inflammatory changes. Vascular/Lymphatic: Aortic atherosclerosis. The splenic artery and vein as well as the portal vein appear normal. No enlarged abdominal or pelvic lymph nodes. Reproductive: Prostate is unremarkable. Other: No abdominal wall hernia or abnormality. No abdominopelvic ascites. Musculoskeletal: Degenerative changes are seen in the spine and both hips. IMPRESSION: 1. Acute interstitial edematous pancreatitis without evidence of acute peripancreatic fluid collection. 2. Possible 3 mm gallstone in the gallbladder neck. No CT evidence of acute cholecystitis. Aortic Atherosclerosis (ICD10-I70.0). Electronically Signed   By: Zerita Boers M.D.   On: 04/02/2020 12:38    Microbiology: Recent Results (from the past 240 hour(s))  SARS CORONAVIRUS 2 (TAT 6-24 HRS) Nasopharyngeal Nasopharyngeal Swab     Status: None   Collection Time: 04/20/20 11:36 AM   Specimen: Nasopharyngeal Swab  Result Value Ref Range Status   SARS Coronavirus 2 NEGATIVE NEGATIVE Final    Comment: (NOTE) SARS-CoV-2  target nucleic acids are NOT DETECTED.  The SARS-CoV-2 RNA is generally detectable in upper and lower respiratory specimens during the acute phase of infection. Negative results do not preclude SARS-CoV-2 infection, do not rule out co-infections with other pathogens, and should not be used as the sole basis for treatment or other patient management decisions. Negative results must be combined with clinical observations, patient history, and epidemiological information. The expected result is Negative.  Fact Sheet for Patients: HairSlick.no  Fact Sheet for Healthcare Providers: quierodirigir.com  This test is not yet approved or cleared by the [place] FDA and  has been  authorized for detection and/or diagnosis of SARS-CoV-2 by FDA under an Emergency Use Authorization (EUA). This EUA will remain  in effect (meaning this test can be used) for the duration of the COVID-19 declaration under Se ction 564(b)(1) of the Act, 21 U.S.C. section 360bbb-3(b)(1), unless the authorization is terminated or revoked sooner.  Performed at [facility], [address], [place], [place] [address]      Labs: Basic Metabolic Panel: Recent Labs  Lab 04/21/20 1045  NA 132*  K 4.7  CL 105  CO2 20*  GLUCOSE 137*  BUN 20  CREATININE 0.84  CALCIUM 11.1*   Liver Function Tests: Recent Labs  Lab 04/21/20 1045  AST 27  ALT 23  ALKPHOS 99  BILITOT 1.1  PROT 6.5  ALBUMIN 3.8   Recent Labs  Lab 04/21/20 1045  LIPASE 35  AMYLASE 21*   No results for input(s): AMMONIA in the last 168 hours. CBC: Recent Labs  Lab 04/20/20 1026 04/21/20 1045  WBC 7.7 6.0  NEUTROABS  --  4.3  HGB 13.4 12.8*  HCT 39.2 36.9*  MCV 102.6* 101.1*  PLT 276 240   Cardiac Enzymes: No results for input(s): CKTOTAL, CKMB, CKMBINDEX, TROPONINI in the last 168 hours. BNP: BNP (last 3 results) No results for input(s): BNP in the last 8760 hours.  ProBNP (last 3 results) No results for input(s): PROBNP in the last 8760 hours.  CBG: No results for input(s): GLUCAP in the last 168 hours.  Active Problems:   Symptomatic cholelithiasis   Time coordinating discharge: 15 min  Signed:  Atilano Ina, MD [facility], [place] [phone] 04/24/2020, 9:19 AM

## 2020-04-27 LAB — SURGICAL PATHOLOGY

## 2020-05-05 MED FILL — ONDANSETRON HCL 8 MG TABLET: 8 | 15 days supply | Qty: 30 | Fill #1

## 2020-05-11 NOTE — Progress Notes (Signed)
Philip [name]   [phone] [phone]   Clinic Follow up Note   Patient Care Team: [name], MD as PCP - General (Family Medicine) Mansouraty, [name], MD as Consulting Physician (Gastroenterology) Zonia Kief, MD (Rehabilitation)  Date of Service:  05/12/2020  CHIEF COMPLAINT: f/uesophageal cancer  SUMMARY OF ONCOLOGIC HISTORY: Oncology History Overview Note  Cancer Staging Malignant neoplasm of gastroesophageal junction [facility]) Staging form: Esophagus - Adenocarcinoma, AJCC 8th Edition - Clinical stage from 02/26/2018: Stage IIB (cT2, cN0, cM0, G3) - Signed by [name], NP on 02/26/2018    Malignant neoplasm of gastroesophageal junction ([place])  01/22/2018 Initial Biopsy   Diagnosis Surgical [P], GE junction nodule BX - POORLY 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   01/22/2018 Procedure   COLONOSCOPY IMPRESSION - Redundant colon. - The examination was otherwise normal on direct and retroflexion views. - No specimens collected.  UPPER ENDOSCOPY IMPRESSION: - Esophageal mucosal changes secondary to established short-segment Barrett's disease. - Mucosal nodule found in the esophagus. Biopsied. - Erythematous mucosa in the prepyloric region of the stomach. - Submucosal nodule found in the duodenum.    02/01/2018 Initial Diagnosis   Malignant neoplasm of gastroesophageal junction ([place])   02/05/2018 Imaging   CT CAP IMPRESSION: 1. No esophageal primary identified. No findings of metastatic disease in the chest, abdomen, or pelvis. 2.  Aortic Atherosclerosis (ICD10-I70.0).   02/12/2018 Procedure   EUS Impression: - A mass was found in the distal esophagus into the gastroesophageal junction and extending to proximal cardia. A tissue diagnosis was obtained prior to this exam consistent with adenocarcinoma. This was staged at T2N0Mx, because of a loss of interface of the  muscularis propria as the distal GE Junction becomes the cardia and the mass moves into this region, but there are many more regions and images that suggest a T1 lesion into the submucosa.   05/29/2018 Imaging   NM PET Image Initial (PI) Skull Base To Thigh  IMPRESSION: 1. Hypermetabolic focus at the GE junction consistent with know adenocarcinoma. 2. No evidence of metastatic adenopathy in the mediastinum or upper abdomen. 3. No evidence of liver metastasis.  No pulmonary metastasis.   06/06/2018 -  Chemotherapy   Weeklycarboplatin and Taxol, with concurrent radiationstarting on 06/06/2018-07/10/18   06/06/2018 - 07/16/2018 Radiation Therapy   Concurrent chemoRT with Dr. Harvel Ricks on 06/06/2018-07/16/18   11/06/2018 PET scan   PET 11/06/18  IMPRESSION: 1. No residual hypermetabolic activity at the gastroesophageal junction. 2. No evidence of metastatic disease.   12/22/2018 Procedure   Upper Endoscopy by Dr. Loletha Carrow 12/22/18  IMPRESSION - Esophageal mucosal changes secondary to established short-segment Barrett's disease. - Erythematous, friable (with contact bleeding), nodular mucosa in the esophagus. Biopsied. May be inflammation from radiation. - Erythematous and petechial mucosa in the gastric fundus and gastric body. Appears likely related to radiation. - Multiple fundic gland polyps. - Eroded and nodular mucosa in the antrum. Biopsied. - Submucosal nodule found in the duodenum. Stable in size from prior exams.    12/22/2018 Pathology Results   Diagnosis 12/22/18 1. Surgical [P], random gastric BX - CHRONIC INACTIVE GASTRITIS. - THERE IS NO EVIDENCE OF HELICOBACTER PYLORI, DYSPLASIA, OR MALIGNANCY. - SEE COMMENT. 2. Surgical [P], esophagus, GE junction - GASTROESOPHAGEAL JUNCTION MUCOSA WITH MILD INFLAMMATION CONSISTENT WITH GASTROESOPHAGEAL REFLUX. - THERE IS NO EVIDENCE OF GOBLET CELL METAPLASIA, DYSPLASIA OR MALIGNANCY. - SEE COMMENT.   07/08/2019 Procedure   Upper Endoscopy  by Dr.  Danis 07/08/19  IMPRESSION - Nodule found in the esophagus. Biopsied to rule out recurrent malignancy vs radiation effect. - Food (residue) in the stomach. - Retained food in the duodenum.   07/08/2019 Pathology Results   Diagnosis Surgical [P], distal esophagus - POORLY DIFFERENTIATED ADENOCARCINOMA WITH SIGNET RING FEATURES   07/25/2019 PET scan   IMPRESSION: 1. Signs of disease recurrence at the gastroesophageal junction with local nodal disease as described.   08/14/2019 -  Chemotherapy   CAPOX q3weeks with oral Xeloda 2059m BID 2 weeks on/1 week off starting on 08/14/19, along with last cycle, but he did not feel any difference.  Reduced to 2000 mg in the morning and 1500 mg in the evening from cycle 8.                    --Now on  maintenance Xeloda 1500 mg twice daily, 2 weeks on and 1 week off starting 02/22/20   11/05/2019 PET scan   IMPRESSION: 1. Interval decrease in metabolic activity at the gastroesophageal junction. 2. Interval decrease in size and metabolic activity of gastrohepatic ligament metastatic lymph node. 3. No evidence of new or progressive disease in the chest, abdomen, or pelvis.      CURRENT THERAPY:  Now on maintenance Xeloda 1500 mg twice daily, 2 weeks on and 1 week offstarting 02/22/20. Given recent abdominal pain/pancreatitis, Xeloda has been held since 03/30/20 due to acute cholecystitis  INTERVAL HISTORY:  Philip FOLKSis here for a follow up. He presents to the clinic alone.  He underwent cholecystectomy on April 23, 2020, and has recovered well.  However he still has intermittent epigastric/right abdominal pain, especially after certain food such as pizza.  He is tolerating liquid and soft diet well.  Bowel movement has been normal, he did use hydrocodone more frequently after surgery, but has cut back to his normal dose lately.  He denies any fever, chills, nausea, no other new complaints.  He is able to function well at home.  He is  scheduled to see Dr. AZenia Residesnext week for postop follow-up.  All other systems were reviewed with the patient and are negative.  MEDICAL HISTORY:  Past Medical History:  Diagnosis Date  . Abdominal hernia   . Asthma    mild  . Barrett's esophagus   . Cancer (HCC)    Esophagel cancer  . Cataract    Bil surgery  . Cough    over 2 weeks  . Fluid retention in legs   . GERD (gastroesophageal reflux disease)   . H/O hiatal hernia   . Hypertension   . [place]   . Sleep apnea    don't use c-pap at home/ tried for several years  . SOB (shortness of breath)     SURGICAL HISTORY: Past Surgical History:  Procedure Laterality Date  . BACK SURGERY     5th lumbar  . BALLOON DILATION N/A 10/19/2016   Procedure: BALLOON DILATION;  Surgeon: SLadene Artist MD;  [facility];  Service: Endoscopy;  Laterality: N/A;  . CHOLECYSTECTOMY N/A 04/23/2020   Procedure: LAPAROSCOPIC CHOLECYSTECTOMY WITH INTRAOPERATIVE CHOLANGIOGRAM;  Surgeon: ADwan Bolt MD;  [facility];  Service: General;  Laterality: N/A;  . COLONOSCOPY W/ POLYPECTOMY    . ESOPHAGOGASTRODUODENOSCOPY N/A 10/19/2016   Procedure: ESOPHAGOGASTRODUODENOSCOPY (EGD);  Surgeon: SLadene Artist MD;  [facility];  Service: Endoscopy;  Laterality: N/A;  . ESOPHAGOGASTRODUODENOSCOPY (EGD) WITH PROPOFOL N/A 02/12/2018   Procedure: ESOPHAGOGASTRODUODENOSCOPY (  EGD) WITH PROPOFOL;  Surgeon: Mansouraty, [name], MD;  Location: [place];  Service: Gastroenterology;  Laterality: N/A;  . EUS N/A 02/12/2018   Procedure: UPPER ENDOSCOPIC ULTRASOUND (EUS) RADIAL;  Surgeon: [name], MD;  Location: [place];  Service: Gastroenterology;  Laterality: N/A;  . EYE SURGERY Bilateral    cataract  . LUMBAR LAMINECTOMY     1980's    I have reviewed the social history and family history with the patient and they are unchanged from previous note.  ALLERGIES:  has No Known  Allergies.  MEDICATIONS:  Current Outpatient Medications  Medication Sig Dispense Refill  . albuterol (VENTOLIN HFA) 108 (90 Base) MCG/ACT inhaler Inhale 2 puffs into the lungs every 4 (four) hours as needed for wheezing or shortness of breath.    [name] amitriptyline (ELAVIL) 50 MG tablet Take 50 mg by mouth at bedtime.    [name] aspirin EC 81 MG tablet Take 81 mg by mouth daily.    [name] atorvastatin (LIPITOR) 10 MG tablet Take 10 mg by mouth at bedtime.     . dicyclomine (BENTYL) 10 MG capsule Take 1-2 capsules (10-20 mg total) by mouth every 8 (eight) hours as needed for spasms. 20 capsule 0  . docusate sodium (COLACE) 100 MG capsule Take 1 capsule (100 mg total) by mouth 2 (two) times daily. 60 capsule 0  . etodolac (LODINE) 200 MG capsule Take 200 mg by mouth every 8 (eight) hours as needed for moderate pain.    . famotidine (PEPCID) 20 MG tablet TAKE ONE TABLET BY MOUTH TWICE A DAY (Patient taking differently: Take 20 mg by mouth 2 (two) times daily.) 60 tablet 1  . HYDROcodone-acetaminophen (NORCO) 7.5-325 MG tablet Take 1 tablet by mouth 4 (four) times daily as needed for severe pain. 20 tablet 0  . lisinopril (ZESTRIL) 20 MG tablet Take 20 mg by mouth daily.    . Multiple Vitamin (MULTIVITAMIN) tablet Take 1 tablet by mouth daily.    . mycophenolate (CELLCEPT) 500 MG tablet Take 2 tablets (1,000 mg total) by mouth 2 (two) times daily. 120 tablet 12  . ondansetron (ZOFRAN) 8 MG tablet Take 1 tablet (8 mg total) by mouth 2 (two) times daily as needed for refractory nausea / vomiting. Start on day 3 after chemotherapy. 30 tablet 1  . prochlorperazine (COMPAZINE) 10 MG tablet Take 1 tablet (10 mg total) by mouth every 6 (six) hours as needed (Nausea or vomiting). 30 tablet 1  . pyridostigmine (MESTINON) 60 MG tablet Take 0.5-1 tablets (30-60 mg total) by mouth 3 (three) times daily. 90 tablet 12  . XELODA 500 MG tablet Take 3 tablets (1,500 mg total) by mouth 2 (two) times daily after a meal. Take for 14  days, then hold for 7 days. Repeat every 21 days. (Patient not taking: Reported on 04/21/2020) 84 tablet 2   Current Facility-Administered Medications  Medication Dose Route Frequency Provider Last Rate Last Admin  . 0.9 %  sodium chloride infusion  500 mL Intravenous Once Nelida Meuse III, MD        PHYSICAL EXAMINATION: ECOG PERFORMANCE STATUS: 2 - Symptomatic, <50% confined to bed  Vitals:   05/12/20 1332  BP: 123/77  Pulse: 99  Resp: 19  Temp: 98.6 F (37 C)  SpO2: 100%   Filed Weights   05/12/20 1332  Weight: 266 lb 12.8 oz (121 kg)    GENERAL:alert, no distress and comfortable SKIN: skin color, texture, turgor are normal, no rashes or  significant lesions EYES: normal, Conjunctiva are pink and non-injected, sclera clear NECK: supple, thyroid normal size, non-tender, without nodularity LYMPH:  no palpable lymphadenopathy in the cervical, axillary  LUNGS: clear to auscultation and percussion with normal breathing effort HEART: regular rate & rhythm and no murmurs and no lower extremity edema ABDOMEN:abdomen soft, mild tenderness in the right upper quadrant, no organomegaly.  Laparoscope surgical incisions have healed well Musculoskeletal:no cyanosis of digits and no clubbing  NEURO: alert & oriented x 3 with fluent speech, no focal motor/sensory deficits  LABORATORY DATA:  I have reviewed the data as listed CBC Latest Ref Rng & Units 05/12/2020 04/21/2020 04/20/2020  WBC 4.0 - 10.5 K/uL 11.9(H) 6.0 7.7  Hemoglobin 13.0 - 17.0 g/dL 13.0 12.8(L) 13.4  Hematocrit 39.0 - 52.0 % 37.4(L) 36.9(L) 39.2  Platelets 150 - 400 K/uL 275 240 276     CMP Latest Ref Rng & Units 05/12/2020 04/21/2020 03/30/2020  Glucose 70 - 99 mg/dL 175(H) 137(H) 138(H)  BUN 8 - 23 mg/dL _0 Creatinine 0.61 - 1.24 mg/dL 0.76 0.84 0.80  Sodium 135 - 145 mmol/L 133(L) 132(L) 137  Potassium 3.5 - 5.1 mmol/L 4.7 4.7 4.4  Chloride 98 - 111 mmol/L 105 105 105  CO2 22 - 32 mmol/L 23 20(L) 23   Calcium 8.9 - 10.3 mg/dL 11.4(H) 11.1(H) 11.7(H)  Total Protein 6.5 - 8.1 g/dL 6.5 6.5 6.5  Total Bilirubin 0.3 - 1.2 mg/dL 0.7 1.1 0.9  Alkaline Phos 38 - 126 U/L 115 99 93  AST 15 - 41 U/L _1 ALT 0 - 44 U/L 22 23 32      RADIOGRAPHIC STUDIES: I have personally reviewed the radiological images as listed and agreed with the findings in the report. No results found.   ASSESSMENT & PLAN:  Philip [name] is a 75 y.o. male with    1.  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 Due to his age and comorbidities, hewas felt to be a poorcandidate for surgery.  -He completed concurrent chemoRT with CT.His post-treatment PET and endoscopy in 12/2018 showed no residual disease. -Unfortunately he either had residual disease vs localearly stage cancer recurrencein 07/08/19. -His [facility] Onetest showed PDL-1 10%+,mutation burden 10, HER-2 mutation(+), no other targeted therapy. Due to his history ofmyasthenia gravis,he is not a candidate for immunotherapy. -I started him onCAPOX q3weeks with oral Xeloda2036m BID2 weeks on/1 week offon4/14/21.He is now on maintenanceXeloda 1500 mg twice daily, 2 weeks on and 1 week offstarting 02/22/20. -Xeloda held after 03/30/20 due pancreatitis and possible gall stone.  -He is recovering from cholecystectomy, still has intermittent abdominal pain.  We will continue hold Xeloda for now, and reevaluate in a month.   2. Acute pancreatitis  -S/p C2 Xeloda he started having diffuse mid abdominal gassy pain which is constant. He has diffuse tenderness of mid to upper abdomen on 03/30/20 exam with very active bowel sounds.  -His 04/02/20 CT AP showed Acute interstitial edematous pancreatitis without evidence of acute peripancreatic fluid  collection and possible 329mgallstones.  -He underwent lap cholecystectomy on 04/23/20 with Dr. AlZenia Resides-he still has mild intermittent postprandial pain, amylase today is still slightly elevated, but better than before. -I meant him to avoid any greasy food, and drink fluids adequately -f/u with Dr. AlZenia Residesext week  3.Chronic back pain  -Continue toF/uwith pain specialist, on Hydrocodone. Continue to F/u with andorthopedicsurgeon, Dr Maia Petties. He is cleared for steroid injections -stable  4.HTN,Myasthenia gravis -Oncellcept 1060mBIDfor longer term immunosuppressionand Mestinon.f/u with neurologist and PCP  5. Hypercalcemia,primary hyperparathyroidism -He takes Vitamin D. He is fine to take multivitamin that contains low dose calcium.I advised him to avoid high dose calcium. -Prior labshavesupportedprimary hyperparathyroidism.F/u withPCP about this.  6.Goal of care discussion  -He is full code now. [place] reviewed, we discussed diet  -continue hold Xeloda for now -lab and f/u in a month  -copy Dr. AZenia Resides    No problem-specific Assessment & Plan notes found for this encounter.   No orders of the defined types were placed in this encounter.  All questions were answered. The patient knows to call the clinic with any problems, questions or concerns. No barriers to learning was detected. The total time spent in the appointment was 30 minutes.     YTruitt Merle MD 05/12/2020   I, AJoslyn Devon am acting as scribe for YTruitt Merle MD.   I have reviewed the above documentation for accuracy and completeness, and I agree with the above.

## 2020-05-12 ENCOUNTER — Other Ambulatory Visit: Payer: Medicare Other

## 2020-05-12 ENCOUNTER — Inpatient Hospital Stay (HOSPITAL_BASED_OUTPATIENT_CLINIC_OR_DEPARTMENT_OTHER): Payer: Medicare Other | Admitting: Hematology

## 2020-05-12 ENCOUNTER — Ambulatory Visit: Payer: Medicare Other | Admitting: Hematology

## 2020-05-12 ENCOUNTER — Encounter: Payer: Self-pay | Admitting: Hematology

## 2020-05-12 ENCOUNTER — Other Ambulatory Visit: Payer: Self-pay

## 2020-05-12 ENCOUNTER — Inpatient Hospital Stay: Payer: Medicare Other | Attending: Nurse Practitioner

## 2020-05-12 VITALS — BP 123/77 | HR 99 | Temp 98.6°F | Resp 19 | Ht 72.0 in | Wt 266.8 lb

## 2020-05-12 DIAGNOSIS — C160 Malignant neoplasm of cardia: Secondary | ICD-10-CM

## 2020-05-12 DIAGNOSIS — K859 Acute pancreatitis without necrosis or infection, unspecified: Secondary | ICD-10-CM | POA: Diagnosis not present

## 2020-05-12 DIAGNOSIS — K858 Other acute pancreatitis without necrosis or infection: Secondary | ICD-10-CM

## 2020-05-12 DIAGNOSIS — Z79899 Other long term (current) drug therapy: Secondary | ICD-10-CM | POA: Insufficient documentation

## 2020-05-12 DIAGNOSIS — Z85028 Personal history of other malignant neoplasm of stomach: Secondary | ICD-10-CM | POA: Diagnosis not present

## 2020-05-12 DIAGNOSIS — G7000 Myasthenia gravis without (acute) exacerbation: Secondary | ICD-10-CM | POA: Insufficient documentation

## 2020-05-12 LAB — CBC WITH DIFFERENTIAL (CANCER CENTER ONLY)
Abs Immature Granulocytes: 0.06 10*3/uL (ref 0.00–0.07)
Basophils Absolute: 0 10*3/uL (ref 0.0–0.1)
Basophils Relative: 0 %
Eosinophils Absolute: 0 10*3/uL (ref 0.0–0.5)
Eosinophils Relative: 0 %
HCT: 37.4 % — ABNORMAL LOW (ref 39.0–52.0)
Hemoglobin: 13 g/dL (ref 13.0–17.0)
Immature Granulocytes: 1 %
Lymphocytes Relative: 3 %
Lymphs Abs: 0.4 10*3/uL — ABNORMAL LOW (ref 0.7–4.0)
MCH: 34.2 pg — ABNORMAL HIGH (ref 26.0–34.0)
MCHC: 34.8 g/dL (ref 30.0–36.0)
MCV: 98.4 fL (ref 80.0–100.0)
Monocytes Absolute: 0.7 10*3/uL (ref 0.1–1.0)
Monocytes Relative: 6 %
Neutro Abs: 10.7 10*3/uL — ABNORMAL HIGH (ref 1.7–7.7)
Neutrophils Relative %: 90 %
Platelet Count: 275 10*3/uL (ref 150–400)
RBC: 3.8 MIL/uL — ABNORMAL LOW (ref 4.22–5.81)
RDW: 12.9 % (ref 11.5–15.5)
WBC Count: 11.9 10*3/uL — ABNORMAL HIGH (ref 4.0–10.5)
nRBC: 0 % (ref 0.0–0.2)

## 2020-05-12 LAB — CMP (CANCER CENTER ONLY)
ALT: 22 U/L (ref 0–44)
AST: 24 U/L (ref 15–41)
Albumin: 3.5 g/dL (ref 3.5–5.0)
Alkaline Phosphatase: 115 U/L (ref 38–126)
Anion gap: 5 (ref 5–15)
BUN: 15 mg/dL (ref 8–23)
CO2: 23 mmol/L (ref 22–32)
Calcium: 11.4 mg/dL — ABNORMAL HIGH (ref 8.9–10.3)
Chloride: 105 mmol/L (ref 98–111)
Creatinine: 0.76 mg/dL (ref 0.61–1.24)
GFR, Estimated: >60 mL/min (ref >60)
Glucose, Bld: 175 mg/dL — ABNORMAL HIGH (ref 70–99)
Potassium: 4.7 mmol/L (ref 3.5–5.1)
Sodium: 133 mmol/L — ABNORMAL LOW (ref 135–145)
Total Bilirubin: 0.7 mg/dL (ref 0.3–1.2)
Total Protein: 6.5 g/dL (ref 6.5–8.1)

## 2020-05-12 LAB — LIPASE, BLOOD: Lipase: 23 U/L (ref 11–51)

## 2020-05-12 LAB — AMYLASE: Amylase: 15 U/L — ABNORMAL LOW (ref 28–100)

## 2020-05-14 ENCOUNTER — Telehealth: Payer: Self-pay | Admitting: Hematology

## 2020-05-14 NOTE — Telephone Encounter (Signed)
Scheduled appts per 1/11 los. Pt confirmed appt date and time.

## 2020-05-27 ENCOUNTER — Encounter (HOSPITAL_COMMUNITY): Payer: Self-pay | Admitting: *Deleted

## 2020-05-27 ENCOUNTER — Telehealth: Payer: Self-pay | Admitting: Surgery

## 2020-05-27 ENCOUNTER — Observation Stay (HOSPITAL_COMMUNITY): Payer: Medicare Other

## 2020-05-27 ENCOUNTER — Other Ambulatory Visit: Payer: Self-pay

## 2020-05-27 ENCOUNTER — Inpatient Hospital Stay (HOSPITAL_COMMUNITY)
Admission: EM | Admit: 2020-05-27 | Discharge: 2020-05-30 | DRG: 445 | Disposition: A | Discharge status: Home / Self Care | Payer: Medicare Other | Attending: Surgery | Admitting: Surgery

## 2020-05-27 DIAGNOSIS — K831 Obstruction of bile duct: Principal | ICD-10-CM | POA: Diagnosis present

## 2020-05-27 DIAGNOSIS — E871 Hypo-osmolality and hyponatremia: Secondary | ICD-10-CM | POA: Diagnosis present

## 2020-05-27 DIAGNOSIS — G7000 Myasthenia gravis without (acute) exacerbation: Secondary | ICD-10-CM | POA: Diagnosis present

## 2020-05-27 DIAGNOSIS — Z9221 Personal history of antineoplastic chemotherapy: Secondary | ICD-10-CM

## 2020-05-27 DIAGNOSIS — Z87891 Personal history of nicotine dependence: Secondary | ICD-10-CM

## 2020-05-27 DIAGNOSIS — R1011 Right upper quadrant pain: Secondary | ICD-10-CM | POA: Diagnosis not present

## 2020-05-27 DIAGNOSIS — C160 Malignant neoplasm of cardia: Secondary | ICD-10-CM | POA: Diagnosis present

## 2020-05-27 DIAGNOSIS — Z9049 Acquired absence of other specified parts of digestive tract: Secondary | ICD-10-CM

## 2020-05-27 DIAGNOSIS — R109 Unspecified abdominal pain: Secondary | ICD-10-CM

## 2020-05-27 DIAGNOSIS — Z7982 Long term (current) use of aspirin: Secondary | ICD-10-CM

## 2020-05-27 DIAGNOSIS — I10 Essential (primary) hypertension: Secondary | ICD-10-CM | POA: Diagnosis present

## 2020-05-27 DIAGNOSIS — Z79899 Other long term (current) drug therapy: Secondary | ICD-10-CM

## 2020-05-27 DIAGNOSIS — Z923 Personal history of irradiation: Secondary | ICD-10-CM

## 2020-05-27 DIAGNOSIS — K2270 Barrett's esophagus without dysplasia: Secondary | ICD-10-CM | POA: Diagnosis present

## 2020-05-27 DIAGNOSIS — E669 Obesity, unspecified: Secondary | ICD-10-CM | POA: Diagnosis present

## 2020-05-27 DIAGNOSIS — Z20822 Contact with and (suspected) exposure to covid-19: Secondary | ICD-10-CM | POA: Diagnosis present

## 2020-05-27 DIAGNOSIS — J45909 Unspecified asthma, uncomplicated: Secondary | ICD-10-CM | POA: Diagnosis present

## 2020-05-27 DIAGNOSIS — Z6833 Body mass index (BMI) 33.0-33.9, adult: Secondary | ICD-10-CM

## 2020-05-27 DIAGNOSIS — D649 Anemia, unspecified: Secondary | ICD-10-CM | POA: Diagnosis present

## 2020-05-27 MED ORDER — ALBUTEROL SULFATE HFA 108 (90 BASE) MCG/ACT IN AERS: 2.0000 | INHALATION_SPRAY | RESPIRATORY_TRACT | Status: DC | PRN

## 2020-05-27 MED ORDER — MYCOPHENOLATE MOFETIL 250 MG PO CAPS
1000.0000 mg | ORAL_CAPSULE | Freq: Two times a day (BID) | ORAL | Status: DC
  Administered 2020-05-27 – 2020-05-30 (×6): 1000 mg via ORAL
  Filled 2020-05-27 (×6): qty 4

## 2020-05-27 MED ORDER — SODIUM CHLORIDE 0.9 % IV SOLN: INTRAVENOUS | Status: DC

## 2020-05-27 MED ORDER — ONDANSETRON HCL 4 MG/2ML IJ SOLN: 4.0000 mg | Freq: Four times a day (QID) | INTRAMUSCULAR | Status: DC | PRN

## 2020-05-27 MED ORDER — ONDANSETRON 4 MG PO TBDP: 4.0000 mg | ORAL_TABLET | Freq: Four times a day (QID) | ORAL | Status: DC | PRN

## 2020-05-27 MED ORDER — PANTOPRAZOLE SODIUM 40 MG IV SOLR
40.0000 mg | Freq: Every day | INTRAVENOUS | Status: DC
  Administered 2020-05-27 – 2020-05-29 (×3): 40 mg via INTRAVENOUS
  Filled 2020-05-27 (×3): qty 40

## 2020-05-27 MED ORDER — GADOBUTROL 1 MMOL/ML IV SOLN
10.0000 mL | Freq: Once | INTRAVENOUS | Status: AC | PRN
  Administered 2020-05-27: 10 mL via INTRAVENOUS

## 2020-05-27 MED ORDER — PROCHLORPERAZINE MALEATE 10 MG PO TABS
10.0000 mg | ORAL_TABLET | Freq: Four times a day (QID) | ORAL | Status: DC | PRN
  Filled 2020-05-27: qty 1

## 2020-05-27 MED ORDER — AMITRIPTYLINE HCL 50 MG PO TABS
50.0000 mg | ORAL_TABLET | Freq: Every day | ORAL | Status: DC
  Administered 2020-05-27 – 2020-05-29 (×3): 50 mg via ORAL
  Filled 2020-05-27 (×2): qty 1
  Filled 2020-05-27: qty 2

## 2020-05-27 MED ORDER — LISINOPRIL 20 MG PO TABS
20.0000 mg | ORAL_TABLET | Freq: Every day | ORAL | Status: DC
  Administered 2020-05-28 – 2020-05-29 (×2): 20 mg via ORAL
  Filled 2020-05-27 (×3): qty 1

## 2020-05-27 MED ORDER — MORPHINE SULFATE (PF) 2 MG/ML IV SOLN
1.0000 mg | INTRAVENOUS | Status: DC | PRN
  Administered 2020-05-28 (×3): 2 mg via INTRAVENOUS
  Filled 2020-05-27 (×3): qty 1

## 2020-05-27 MED ORDER — HEPARIN SODIUM (PORCINE) 5000 UNIT/ML IJ SOLN
5000.0000 [IU] | Freq: Three times a day (TID) | INTRAMUSCULAR | Status: DC
  Administered 2020-05-27 – 2020-05-28 (×2): 5000 [IU] via SUBCUTANEOUS
  Filled 2020-05-27 (×2): qty 1

## 2020-05-27 MED ORDER — PYRIDOSTIGMINE BROMIDE 60 MG PO TABS
30.0000 mg | ORAL_TABLET | Freq: Three times a day (TID) | ORAL | Status: DC
  Administered 2020-05-27 – 2020-05-28 (×2): 60 mg via ORAL
  Filled 2020-05-27 (×3): qty 1

## 2020-05-27 NOTE — ED Triage Notes (Signed)
Pt had recent Gallbladder  Surgery. Sent here due to uncontrolled pain since surgery which has become worse. Dr Zenia Resides with [facility]

## 2020-05-27 NOTE — H&P (Signed)
Philip [name] is an 75 y.o. male.   Chief Complaint: abd pain HPI: The patient is a 75 year old white male who is about a month s/p lap chole. He has been having abd pain. Labs earlier showed elevated bili. He is admitted for workup and MRCP  Past Medical History:  Diagnosis Date  . Abdominal hernia   . Asthma    mild  . Barrett's esophagus   . Cancer (HCC)    Esophagel cancer  . Cataract    Bil surgery  . Cough    over 2 weeks  . Fluid retention in legs   . GERD (gastroesophageal reflux disease)   . H/O hiatal hernia   . Hypertension   . Myasthenia gravis ([place])   . Sleep apnea    don't use c-pap at home/ tried for several years  . SOB (shortness of breath)     Past Surgical History:  Procedure Laterality Date  . BACK SURGERY     5th lumbar  . BALLOON DILATION N/A 10/19/2016   Procedure: BALLOON DILATION;  Surgeon: Ladene Artist, MD;  [facility];  Service: Endoscopy;  Laterality: N/A;  . CHOLECYSTECTOMY N/A 04/23/2020   Procedure: LAPAROSCOPIC CHOLECYSTECTOMY WITH INTRAOPERATIVE CHOLANGIOGRAM;  Surgeon: [name], MD;  [facility];  Service: General;  Laterality: N/A;  . COLONOSCOPY W/ POLYPECTOMY    . ESOPHAGOGASTRODUODENOSCOPY N/A 10/19/2016   Procedure: ESOPHAGOGASTRODUODENOSCOPY (EGD);  Surgeon: Ladene Artist, MD;  [facility];  Service: Endoscopy;  Laterality: N/A;  . ESOPHAGOGASTRODUODENOSCOPY (EGD) WITH PROPOFOL N/A 02/12/2018   Procedure: ESOPHAGOGASTRODUODENOSCOPY (EGD) WITH PROPOFOL;  Surgeon: [name], MD;  Location: [place];  Service: Gastroenterology;  Laterality: N/A;  . EUS N/A 02/12/2018   Procedure: UPPER ENDOSCOPIC ULTRASOUND (EUS) RADIAL;  Surgeon: [name], MD;  Location: [place];  Service: Gastroenterology;  Laterality: N/A;  . EYE SURGERY Bilateral    cataract  . LUMBAR LAMINECTOMY     1980's    Family History  Problem Relation Age of Onset  . Heart disease Mother    . Lung cancer Father   . Colon cancer Neg Hx   . Esophageal cancer Neg Hx   . Stomach cancer Neg Hx   . Rectal cancer Neg Hx    Social History:  reports that he quit smoking about 43 years ago. He has a 7.00 pack-year smoking history. He has never used smokeless tobacco. He reports current alcohol use. He reports that he does not use drugs.  Allergies: No Known Allergies  (Not in a hospital admission)   No results found for this or any previous visit (from the past 48 hour(s)). No results found.  Review of Systems  Constitutional: Negative.   HENT: Negative.   Eyes: Negative.   Respiratory: Negative.   Cardiovascular: Negative.   Gastrointestinal: Positive for abdominal pain.  Endocrine: Negative.   Genitourinary: Negative.   Musculoskeletal: Negative.   Skin: Negative.   Allergic/Immunologic: Negative.   Neurological: Negative.   Hematological: Negative.   Psychiatric/Behavioral: Negative.     Blood pressure (!) 138/54, pulse (!) 107, temperature 98.4 F (36.9 C), temperature source Oral, resp. rate 18, height 6' (1.829 m), weight 113.4 kg, SpO2 100 %. Physical Exam Constitutional:      General: He is not in acute distress.    Appearance: Normal appearance.  HENT:     Head: Normocephalic and atraumatic.     Right Ear: External ear normal.     Left  Ear: External ear normal.     Nose: Nose normal.     Mouth/Throat:     Mouth: Mucous membranes are moist.     Pharynx: Oropharynx is clear.  Eyes:     Extraocular Movements: Extraocular movements intact.     Conjunctiva/sclera: Conjunctivae normal.     Pupils: Pupils are equal, round, and reactive to light.  Cardiovascular:     Rate and Rhythm: Normal rate and regular rhythm.     Pulses: Normal pulses.     Heart sounds: Normal heart sounds.  Pulmonary:     Effort: Pulmonary effort is normal. No respiratory distress.     Breath sounds: Normal breath sounds.  Abdominal:     General: There is no distension.      Palpations: Abdomen is soft.     Tenderness: There is abdominal tenderness.     Comments: Mild RUQ tenderness  Musculoskeletal:        General: No tenderness or deformity. Normal range of motion.     Cervical back: Normal range of motion and neck supple. No tenderness.  Skin:    General: Skin is warm and dry.     Findings: No rash.  Neurological:     General: No focal deficit present.     Mental Status: He is alert and oriented to person, place, and time.  Psychiatric:        Mood and Affect: Mood normal.        Behavior: Behavior normal.      Assessment/Plan The patient has elevated bili and pain one month after lap chole. Will admit for hydration and pain control. Will obtain mrcp in am and discuss with Dr. Adrian Blackwater, MD 05/27/2020, 9:14 PM

## 2020-05-27 NOTE — Telephone Encounter (Signed)
Patient is 1 month s/p lap chole with intraop cholangiogram for gallstone pancreatitis. He was seen in clinic yesterday for postop follow up, at which time he was having persistent RUQ and epigastric pain. Labs were sent and resulted today (see results below) and were significant for a bilirubin of 6 with elevated alk phos. Of note, LFTs at 3 weeks postop were normal. Patient had a normal intraop cholangiogram and with normal labs at 3 weeks postop, I think bile duct injury and retained CBD stone are unlikely at this point. Differential includes biliary stricture as a sequelae of acute pancreatitis vs intrinsic liver dysfunction. Patient will require admission for workup of new-onset jaundice. I called the patient and discussed his lab results with him and instructed him to go to the ED for admission. Will need MRCP to evaluate biliary anatomy. He expressed understanding and is planning to come to the [facility] ED this afternoon.  Michaelle Birks, MD [facility] Surgery General, Hepatobiliary and Pancreatic Surgery 05/27/20 8:42 AM

## 2020-05-27 NOTE — ED Provider Notes (Signed)
[facility] DEPT Provider Note   CSN: [id number] Arrival date & time: 05/27/20  1827     History Chief Complaint  Patient presents with  . Post-op Problem  . Abdominal Pain    Philip [name] is a 75 y.o. male.  The history is provided by the patient. No language interpreter was used.  Abdominal Pain Pain location:  RUQ Pain quality: stabbing   Pain severity:  Severe Onset quality:  Gradual Pt had laparoscopic cholecystectomy on 12/23. Pt has had pain since.  Pt had elevated bilirubin.  Dr. Zenia Resides advised pt to come to ED today to be admitted     Past Medical History:  Diagnosis Date  . Abdominal hernia   . Asthma    mild  . Barrett's esophagus   . Cancer (HCC)    Esophagel cancer  . Cataract    Bil surgery  . Cough    over 2 weeks  . Fluid retention in legs   . GERD (gastroesophageal reflux disease)   . H/O hiatal hernia   . Hypertension   . Myasthenia gravis ([place])   . Sleep apnea    don't use c-pap at home/ tried for several years  . SOB (shortness of breath)     Patient Active Problem List   Diagnosis Date Noted  . Hyperbilirubinemia 05/27/2020  . Symptomatic cholelithiasis 04/23/2020  . Goals of care, counseling/discussion 08/02/2019  . Anemia 07/24/2018  . ARF (acute renal failure) ([place]) 07/24/2018  . Dehydration   . Hypotension   . Urinary retention   . AKI (acute kidney injury) ([place]) 07/23/2018  . Hypertension 07/23/2018  . UTI (urinary tract infection) 07/23/2018  . Odynophagia 07/23/2018  . Hyponatremia 07/23/2018  . Hypercalcemia 07/23/2018  . Pre-operative cardiovascular examination 02/22/2018  . Shortness of breath 02/22/2018  . 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 Myasthenia gravis ([place]) 01/18/2017  . OSA on CPAP 01/18/2017  . Esophageal stricture 10/27/2016  . Dysphagia   . Abnormal esophagram   . ESOPHAGEAL REFLUX 05/08/2009  . BARRETTS ESOPHAGUS 05/08/2009  .  DIVERTICULOSIS OF COLON 05/08/2009  . ABDOMINAL BLOATING 05/08/2009    Past Surgical History:  Procedure Laterality Date  . BACK SURGERY     5th lumbar  . BALLOON DILATION N/A 10/19/2016   Procedure: BALLOON DILATION;  Surgeon: Ladene Artist, MD;  Location: [facility];  Service: Endoscopy;  Laterality: N/A;  . CHOLECYSTECTOMY N/A 04/23/2020   Procedure: LAPAROSCOPIC CHOLECYSTECTOMY WITH INTRAOPERATIVE CHOLANGIOGRAM;  Surgeon: [name], MD;  Location: [facility];  Service: General;  Laterality: N/A;  . COLONOSCOPY W/ POLYPECTOMY    . ESOPHAGOGASTRODUODENOSCOPY N/A 10/19/2016   Procedure: ESOPHAGOGASTRODUODENOSCOPY (EGD);  Surgeon: Ladene Artist, MD;  Location: [facility];  Service: Endoscopy;  Laterality: N/A;  . ESOPHAGOGASTRODUODENOSCOPY (EGD) WITH PROPOFOL N/A 02/12/2018   Procedure: ESOPHAGOGASTRODUODENOSCOPY (EGD) WITH PROPOFOL;  Surgeon: [name], MD;  Location: [facility];  Service: Gastroenterology;  Laterality: N/A;  . EUS N/A 02/12/2018   Procedure: UPPER ENDOSCOPIC ULTRASOUND (EUS) RADIAL;  Surgeon: [name], MD;  Location: [facility];  Service: Gastroenterology;  Laterality: N/A;  . EYE SURGERY Bilateral    cataract  . LUMBAR LAMINECTOMY     1980's       Family History  Problem Relation Age of Onset  . Heart disease Mother   . Lung cancer Father   . Colon cancer Neg Hx   . Esophageal cancer Neg Hx   . Stomach cancer  Neg Hx   . Rectal cancer Neg Hx     Social History   Tobacco Use  . Smoking status: Former Smoker    Packs/day: 1.00    Years: 7.00    Pack years: 7.00    Quit date: 12/21/1976    Years since quitting: 43.4  . Smokeless tobacco: Never Used  Vaping Use  . Vaping Use: Never used  Substance Use Topics  . Alcohol use: Yes    Comment: occ  . Drug use: No    Home Medications Prior to Admission medications   Medication Sig Start Date End Date Taking? Authorizing Provider  albuterol (VENTOLIN HFA) 108  (90 Base) MCG/ACT inhaler Inhale 2 puffs into the lungs every 4 (four) hours as needed for wheezing or shortness of breath.    [provider]  amitriptyline (ELAVIL) 50 MG tablet Take 50 mg by mouth at bedtime. 08/30/19   [provider]  aspirin EC 81 MG tablet Take 81 mg by mouth daily.    [provider]  atorvastatin (LIPITOR) 10 MG tablet Take 10 mg by mouth at bedtime.  03/22/16   [provider]  dicyclomine (BENTYL) 10 MG capsule Take 1-2 capsules (10-20 mg total) by mouth every 8 (eight) hours as needed for spasms. 03/30/20   Truitt Merle, MD  etodolac (LODINE) 200 MG capsule Take 200 mg by mouth every 8 (eight) hours as needed for moderate pain.    [provider]  famotidine (PEPCID) 20 MG tablet TAKE ONE TABLET BY MOUTH TWICE A DAY Patient taking differently: Take 20 mg by mouth 2 (two) times daily. 04/06/20   Truitt Merle, MD  HYDROcodone-acetaminophen (NORCO) 7.5-325 MG tablet Take 1 tablet by mouth 4 (four) times daily as needed for severe pain. 04/23/20   [name], MD  lisinopril (ZESTRIL) 20 MG tablet Take 20 mg by mouth daily. 06/01/19   [provider]  Multiple Vitamin (MULTIVITAMIN) tablet Take 1 tablet by mouth daily.    [provider]  mycophenolate (CELLCEPT) 500 MG tablet Take 2 tablets (1,000 mg total) by mouth 2 (two) times daily. 02/10/20   Penumalli, Earlean Polka, MD  ondansetron (ZOFRAN) 8 MG tablet Take 1 tablet (8 mg total) by mouth 2 (two) times daily as needed for refractory nausea / vomiting. Start on day 3 after chemotherapy. 08/14/19   Truitt Merle, MD  prochlorperazine (COMPAZINE) 10 MG tablet Take 1 tablet (10 mg total) by mouth every 6 (six) hours as needed (Nausea or vomiting). 08/14/19   Truitt Merle, MD  pyridostigmine (MESTINON) 60 MG tablet Take 0.5-1 tablets (30-60 mg total) by mouth 3 (three) times daily. 02/10/20   Penumalli, Earlean Polka, MD  XELODA 500 MG tablet Take 3 tablets (1,500 mg total) by mouth 2  (two) times daily after a meal. Take for 14 days, then hold for 7 days. Repeat every 21 days. Patient not taking: Reported on 04/21/2020 02/13/20   Truitt Merle, MD    Allergies    Patient has no known allergies.  Review of Systems   Review of Systems  Gastrointestinal: Positive for abdominal pain.  All other systems reviewed and are negative.   Physical Exam Updated Vital Signs BP (!) 138/54 (BP Location: Left Arm)   Pulse (!) 107   Temp 98.4 F (36.9 C) (Oral)   Resp 18   Ht 6' (1.829 m)   Wt 113.4 kg   SpO2 100%   BMI 33.91 kg/m   Physical Exam Vitals  and nursing note reviewed.  Constitutional:      Appearance: He is well-developed and well-nourished.  HENT:     Head: Normocephalic.  Eyes:     Extraocular Movements: EOM normal.  Cardiovascular:     Rate and Rhythm: Normal rate.  Pulmonary:     Effort: Pulmonary effort is normal.  Abdominal:     General: Abdomen is flat. There is no distension.     Palpations: Abdomen is soft.     Tenderness: There is abdominal tenderness in the right upper quadrant.  Musculoskeletal:        General: Normal range of motion.     Cervical back: Normal range of motion.  Skin:    General: Skin is warm.  Neurological:     General: No focal deficit present.     Mental Status: He is alert and oriented to person, place, and time.  Psychiatric:        Mood and Affect: Mood and affect normal.     ED Results / Procedures / Treatments   Labs (all labs ordered are listed, but only abnormal results are displayed) Labs Reviewed - No data to display  EKG None  Radiology No results found.  Procedures Procedures   Medications Ordered in ED Medications - No data to display  ED Course  I have reviewed the triage vital signs and the nursing notes.  Pertinent labs & imaging results that were available during my care of the patient were reviewed by me and considered in my medical decision making (see chart for details).    MDM  Rules/Calculators/A&P                          MDM:   Dr. Marlou Starks general Surgeon on call in to see and examine pt.  He will admit for MRCP  Final Clinical Impression(s) / ED Diagnoses Final diagnoses:  Abdominal pain    Rx / DC Orders ED Discharge Orders    None       Sidney Ace 05/27/20 2135    Drenda Freeze, MD 05/27/20 2325

## 2020-05-28 DIAGNOSIS — J45909 Unspecified asthma, uncomplicated: Secondary | ICD-10-CM | POA: Diagnosis present

## 2020-05-28 DIAGNOSIS — R748 Abnormal levels of other serum enzymes: Secondary | ICD-10-CM | POA: Diagnosis not present

## 2020-05-28 DIAGNOSIS — Z6833 Body mass index (BMI) 33.0-33.9, adult: Secondary | ICD-10-CM | POA: Diagnosis not present

## 2020-05-28 DIAGNOSIS — K831 Obstruction of bile duct: Secondary | ICD-10-CM | POA: Diagnosis present

## 2020-05-28 DIAGNOSIS — K8689 Other specified diseases of pancreas: Secondary | ICD-10-CM

## 2020-05-28 DIAGNOSIS — Z9221 Personal history of antineoplastic chemotherapy: Secondary | ICD-10-CM | POA: Diagnosis not present

## 2020-05-28 DIAGNOSIS — Z923 Personal history of irradiation: Secondary | ICD-10-CM | POA: Diagnosis not present

## 2020-05-28 DIAGNOSIS — Z87891 Personal history of nicotine dependence: Secondary | ICD-10-CM | POA: Diagnosis not present

## 2020-05-28 DIAGNOSIS — C160 Malignant neoplasm of cardia: Secondary | ICD-10-CM | POA: Diagnosis present

## 2020-05-28 DIAGNOSIS — G7000 Myasthenia gravis without (acute) exacerbation: Secondary | ICD-10-CM | POA: Diagnosis present

## 2020-05-28 DIAGNOSIS — Z20822 Contact with and (suspected) exposure to covid-19: Secondary | ICD-10-CM | POA: Diagnosis present

## 2020-05-28 DIAGNOSIS — E669 Obesity, unspecified: Secondary | ICD-10-CM | POA: Diagnosis present

## 2020-05-28 DIAGNOSIS — Z79899 Other long term (current) drug therapy: Secondary | ICD-10-CM | POA: Diagnosis not present

## 2020-05-28 DIAGNOSIS — Z9049 Acquired absence of other specified parts of digestive tract: Secondary | ICD-10-CM | POA: Diagnosis not present

## 2020-05-28 DIAGNOSIS — R17 Unspecified jaundice: Secondary | ICD-10-CM | POA: Diagnosis not present

## 2020-05-28 DIAGNOSIS — D649 Anemia, unspecified: Secondary | ICD-10-CM | POA: Diagnosis present

## 2020-05-28 DIAGNOSIS — I10 Essential (primary) hypertension: Secondary | ICD-10-CM | POA: Diagnosis present

## 2020-05-28 DIAGNOSIS — E871 Hypo-osmolality and hyponatremia: Secondary | ICD-10-CM | POA: Diagnosis present

## 2020-05-28 DIAGNOSIS — Z7982 Long term (current) use of aspirin: Secondary | ICD-10-CM | POA: Diagnosis not present

## 2020-05-28 DIAGNOSIS — R1011 Right upper quadrant pain: Secondary | ICD-10-CM | POA: Diagnosis present

## 2020-05-28 DIAGNOSIS — K2270 Barrett's esophagus without dysplasia: Secondary | ICD-10-CM | POA: Diagnosis present

## 2020-05-28 LAB — COMPREHENSIVE METABOLIC PANEL
ALT: 219 U/L — ABNORMAL HIGH (ref 0–44)
AST: 224 U/L — ABNORMAL HIGH (ref 15–41)
Albumin: 3.2 g/dL — ABNORMAL LOW (ref 3.5–5.0)
Alkaline Phosphatase: 493 U/L — ABNORMAL HIGH (ref 38–126)
Anion gap: 8 (ref 5–15)
BUN: 16 mg/dL (ref 8–23)
CO2: 25 mmol/L (ref 22–32)
Calcium: 10.2 mg/dL (ref 8.9–10.3)
Chloride: 97 mmol/L — ABNORMAL LOW (ref 98–111)
Creatinine, Ser: 0.66 mg/dL (ref 0.61–1.24)
GFR, Estimated: >60 mL/min (ref >60)
Glucose, Bld: 118 mg/dL — ABNORMAL HIGH (ref 70–99)
Potassium: 4.2 mmol/L (ref 3.5–5.1)
Sodium: 130 mmol/L — ABNORMAL LOW (ref 135–145)
Total Bilirubin: 8.8 mg/dL — ABNORMAL HIGH (ref 0.3–1.2)
Total Protein: 5.7 g/dL — ABNORMAL LOW (ref 6.5–8.1)

## 2020-05-28 LAB — PROTIME-INR
INR: 1.3 — ABNORMAL HIGH (ref 0.8–1.2)
Prothrombin Time: 15.6 seconds — ABNORMAL HIGH (ref 11.4–15.2)

## 2020-05-28 LAB — CBC
HCT: 33 % — ABNORMAL LOW (ref 39.0–52.0)
Hemoglobin: 10.9 g/dL — ABNORMAL LOW (ref 13.0–17.0)
MCH: 33.7 pg (ref 26.0–34.0)
MCHC: 33 g/dL (ref 30.0–36.0)
MCV: 102.2 fL — ABNORMAL HIGH (ref 80.0–100.0)
Platelets: 214 10*3/uL (ref 150–400)
RBC: 3.23 MIL/uL — ABNORMAL LOW (ref 4.22–5.81)
RDW: 14.1 % (ref 11.5–15.5)
WBC: 7.8 10*3/uL (ref 4.0–10.5)
nRBC: 0 % (ref 0.0–0.2)

## 2020-05-28 LAB — SARS CORONAVIRUS 2 (TAT 6-24 HRS): SARS Coronavirus 2: NEGATIVE

## 2020-05-28 LAB — SARS CORONAVIRUS 2 BY RT PCR (HOSPITAL ORDER, PERFORMED IN ~~LOC~~ HOSPITAL LAB): SARS Coronavirus 2: NEGATIVE

## 2020-05-28 MED ORDER — HYDROMORPHONE HCL 1 MG/ML IJ SOLN
1.0000 mg | INTRAMUSCULAR | Status: DC | PRN
  Administered 2020-05-28 – 2020-05-29 (×11): 1 mg via INTRAVENOUS
  Filled 2020-05-28 (×11): qty 1

## 2020-05-28 MED ORDER — FAMOTIDINE 20 MG PO TABS
20.0000 mg | ORAL_TABLET | Freq: Two times a day (BID) | ORAL | Status: DC
Start: 2020-05-28 — End: 2020-05-30
  Administered 2020-05-28 – 2020-05-30 (×5): 20 mg via ORAL
  Filled 2020-05-28 (×5): qty 1

## 2020-05-28 MED ORDER — ATORVASTATIN CALCIUM 10 MG PO TABS
10.0000 mg | ORAL_TABLET | Freq: Every day | ORAL | Status: DC
  Administered 2020-05-28 – 2020-05-29 (×2): 10 mg via ORAL
  Filled 2020-05-28 (×2): qty 1

## 2020-05-28 MED ORDER — PYRIDOSTIGMINE BROMIDE 60 MG PO TABS
60.0000 mg | ORAL_TABLET | Freq: Two times a day (BID) | ORAL | Status: DC
  Administered 2020-05-28 – 2020-05-30 (×4): 60 mg via ORAL
  Filled 2020-05-28 (×4): qty 1

## 2020-05-28 MED ORDER — OXYCODONE HCL 5 MG PO TABS
5.0000 mg | ORAL_TABLET | ORAL | Status: DC | PRN
  Administered 2020-05-28: 17:00:00 10 mg via ORAL
  Administered 2020-05-28: 20:00:00 5 mg via ORAL
  Administered 2020-05-28 – 2020-05-29 (×4): 10 mg via ORAL
  Administered 2020-05-30 (×2): 5 mg via ORAL
  Filled 2020-05-28: qty 2
  Filled 2020-05-28: qty 1
  Filled 2020-05-28 (×3): qty 2
  Filled 2020-05-28: qty 1
  Filled 2020-05-28 (×2): qty 2

## 2020-05-28 NOTE — Progress Notes (Addendum)
Subjective: Patient admitted last night. Tbili 8.8. MRCP shows malignant CBD stricture with HOP mass. Patient having severe abdominal pain this morning.   Objective: Vital signs in last 24 hours: Temp:  [98.3 F (36.8 C)-98.4 F (36.9 C)] 98.3 F (36.8 C) (01/26 2259) Pulse Rate:  [98-107] 106 (01/27 0630) Resp:  [13-20] 15 (01/27 0630) BP: (121-141)/(54-88) 137/88 (01/27 0630) SpO2:  [96 %-100 %] 99 % (01/27 0630) Weight:  [113.4 kg] 113.4 kg (01/26 2259)    Intake/Output from previous day: No intake/output data recorded. Intake/Output this shift: No intake/output data recorded.  PE: General: resting comfortably, NAD Neuro: alert and oriented, no focal deficits HEENT: scleral icterus Resp: normal work of breathing Abdomen: soft, nondistended, mildly tender to palpation Extremities: warm and well-perfused   Lab Results:  Recent Labs    05/28/20 0235  WBC 7.8  HGB 10.9*  HCT 33.0*  PLT 214   BMET Recent Labs    05/28/20 0235  NA 130*  K 4.2  CL 97*  CO2 25  GLUCOSE 118*  BUN 16  CREATININE 0.66  CALCIUM 10.2   PT/INR No results for input(s): LABPROT, INR in the last 72 hours. CMP     Component Value Date/Time   NA 130 (L) 05/28/2020 0235   NA 138 12/04/2017 1715   K 4.2 05/28/2020 0235   CL 97 (L) 05/28/2020 0235   CO2 25 05/28/2020 0235   GLUCOSE 118 (H) 05/28/2020 0235   BUN 16 05/28/2020 0235   BUN 19 12/04/2017 1715   CREATININE 0.66 05/28/2020 0235   CREATININE 0.76 05/12/2020 1252   CALCIUM 10.2 05/28/2020 0235   CALCIUM 10.8 (H) 06/28/2018 1347   PROT 5.7 (L) 05/28/2020 0235   PROT 6.0 12/04/2017 1715   ALBUMIN 3.2 (L) 05/28/2020 0235   ALBUMIN 4.0 12/04/2017 1715   AST 224 (H) 05/28/2020 0235   AST 24 05/12/2020 1252   ALT 219 (H) 05/28/2020 0235   ALT 22 05/12/2020 1252   ALKPHOS 493 (H) 05/28/2020 0235   BILITOT 8.8 (H) 05/28/2020 0235   BILITOT 0.7 05/12/2020 1252   GFRNONAA >60 05/28/2020 0235   GFRNONAA >60  05/12/2020 1252   GFRAA >60 01/16/2020 0951   Lipase     Component Value Date/Time   LIPASE 23 05/12/2020 1252       Studies/Results: MR 3D Recon At Scanner  Result Date: 05/27/2020 CLINICAL DATA:  Jaundice, right upper quadrant abdominal pain, recent cholecystectomy EXAM: MRI ABDOMEN WITHOUT AND WITH CONTRAST (INCLUDING MRCP) TECHNIQUE: Multiplanar multisequence MR imaging of the abdomen was performed both before and after the administration of intravenous contrast. Heavily T2-weighted images of the biliary and pancreatic ducts were obtained, and three-dimensional MRCP images were rendered by post processing. CONTRAST:  62mL GADAVIST GADOBUTROL 1 MMOL/ML IV SOLN COMPARISON:  Intraoperative cholangiogram 04/23/2020, CT abdomen pelvis 04/02/2020 FINDINGS: Lower chest: There is pathologic retrocrural adenopathy with the index lymph node measuring 2.0 x 3.8 cm on image # 20/41. Multiple periaortic lymph nodes or potentially left lower lobe pulmonary nodules are also identified in this region Hepatobiliary: Normal hepatic parenchymal signal intensity. There is marked intra and extrahepatic biliary ductal dilation to the level of the pancreatic head. Within the pancreatic head, there is a malignant appearing stricture involving the distal 3 cm of the a common duct with malignant shouldering identified. Tiny cyst noted within segment 4 of the liver. No enhancing liver mass identified. There is pathologic periportal adenopathy at the liver hilum  with the index lymph node measuring 1.5 x 2.7 cm at image # 13/21. Status post cholecystectomy. Pancreas: There has developed a a solid mass within the head of the pancreas measuring 3.6 x 4.1 cm at axial image 24/3 which abuts and narrows the main portal vein,. The common hepatic artery appears distinct from the mass as does the superior mesenteric artery. Separately, the main pancreatic duct within the tail the pancreas appears mildly dilated and there is atrophy  of the tail the pancreas. The body of the pancreas appears mildly thickened,, demonstrating decreased signal intensity on T1 and T2 weighted imaging and relative hypoenhancement with increasing enhance mint noted on delayed phase imaging in keeping with focal fibrosis within the mid body of the pancreas, likely sequela of remote pancreatitis. Spleen:  Unremarkable Adrenals/Urinary Tract: The adrenal glands are unremarkable. Simple cortical cyst noted within the lower pole of the right kidney. The kidneys are otherwise unremarkable. Stomach/Bowel: The stomach and visualized small and large bowel are unremarkable. Vascular/Lymphatic: As noted above, the main portal vein is narrowed by the abutting pancreatic mass, but is still patent. The abdominal vasculature is otherwise unremarkable. There is extensive pathologic adenopathy within the gastrohepatic ligament and retroperitoneum within the periaortic, aortocaval, and retrocaval lymph node groups. This matted conglomerate of pathologic adenopathy measures 4.3 x 9.7 cm in greatest dimension on image 25/3. Other:  None Musculoskeletal: Normal bone marrow signal intensity IMPRESSION: Interval development of a solid mass within the head of the pancreas in most in keeping with a primary pancreatic malignancy with resultant malignant stricture of the distal common duct and resultant marked intra and extrahepatic biliary ductal dilation. Extensive pathologic adenopathy within the retroperitoneum involving the retrocrural, retrocaval, aortocaval and left periaortic lymph node groups. Pathologic adenopathy also noted within the gastrohepatic ligament and periportal lymph node groups. Possible metastatic pulmonary nodules within the left lower lobe. Dedicated CT imaging of the chest, abdomen, and pelvis is recommended for further evaluation. Retroperitoneal adenopathy should provide a ready target for CT-guided biopsy for definitive diagnosis. Abutment of the main portal vein  with secondary narrowing related to the pancreatic mass. The portal vein, however, is still patent. Area of focal fibrosis within the mid body of the pancreas, likely the sequela of remote pancreatitis. Status post cholecystectomy. Electronically Signed   By: Fidela Salisbury MD   On: 05/27/2020 23:12   MR ABDOMEN MRCP W WO CONTAST  Result Date: 05/27/2020 CLINICAL DATA:  Jaundice, right upper quadrant abdominal pain, recent cholecystectomy EXAM: MRI ABDOMEN WITHOUT AND WITH CONTRAST (INCLUDING MRCP) TECHNIQUE: Multiplanar multisequence MR imaging of the abdomen was performed both before and after the administration of intravenous contrast. Heavily T2-weighted images of the biliary and pancreatic ducts were obtained, and three-dimensional MRCP images were rendered by post processing. CONTRAST:  16mL GADAVIST GADOBUTROL 1 MMOL/ML IV SOLN COMPARISON:  Intraoperative cholangiogram 04/23/2020, CT abdomen pelvis 04/02/2020 FINDINGS: Lower chest: There is pathologic retrocrural adenopathy with the index lymph node measuring 2.0 x 3.8 cm on image # 20/41. Multiple periaortic lymph nodes or potentially left lower lobe pulmonary nodules are also identified in this region Hepatobiliary: Normal hepatic parenchymal signal intensity. There is marked intra and extrahepatic biliary ductal dilation to the level of the pancreatic head. Within the pancreatic head, there is a malignant appearing stricture involving the distal 3 cm of the a common duct with malignant shouldering identified. Tiny cyst noted within segment 4 of the liver. No enhancing liver mass identified. There is pathologic periportal adenopathy at the liver  hilum with the index lymph node measuring 1.5 x 2.7 cm at image # 13/21. Status post cholecystectomy. Pancreas: There has developed a a solid mass within the head of the pancreas measuring 3.6 x 4.1 cm at axial image 24/3 which abuts and narrows the main portal vein,. The common hepatic artery appears distinct  from the mass as does the superior mesenteric artery. Separately, the main pancreatic duct within the tail the pancreas appears mildly dilated and there is atrophy of the tail the pancreas. The body of the pancreas appears mildly thickened,, demonstrating decreased signal intensity on T1 and T2 weighted imaging and relative hypoenhancement with increasing enhance mint noted on delayed phase imaging in keeping with focal fibrosis within the mid body of the pancreas, likely sequela of remote pancreatitis. Spleen:  Unremarkable Adrenals/Urinary Tract: The adrenal glands are unremarkable. Simple cortical cyst noted within the lower pole of the right kidney. The kidneys are otherwise unremarkable. Stomach/Bowel: The stomach and visualized small and large bowel are unremarkable. Vascular/Lymphatic: As noted above, the main portal vein is narrowed by the abutting pancreatic mass, but is still patent. The abdominal vasculature is otherwise unremarkable. There is extensive pathologic adenopathy within the gastrohepatic ligament and retroperitoneum within the periaortic, aortocaval, and retrocaval lymph node groups. This matted conglomerate of pathologic adenopathy measures 4.3 x 9.7 cm in greatest dimension on image 25/3. Other:  None Musculoskeletal: Normal bone marrow signal intensity IMPRESSION: Interval development of a solid mass within the head of the pancreas in most in keeping with a primary pancreatic malignancy with resultant malignant stricture of the distal common duct and resultant marked intra and extrahepatic biliary ductal dilation. Extensive pathologic adenopathy within the retroperitoneum involving the retrocrural, retrocaval, aortocaval and left periaortic lymph node groups. Pathologic adenopathy also noted within the gastrohepatic ligament and periportal lymph node groups. Possible metastatic pulmonary nodules within the left lower lobe. Dedicated CT imaging of the chest, abdomen, and pelvis is  recommended for further evaluation. Retroperitoneal adenopathy should provide a ready target for CT-guided biopsy for definitive diagnosis. 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 The portal vein, however, is still patent. Area of focal fibrosis within the mid body of the pancreas, likely the sequela of remote pancreatitis. Status post cholecystectomy. Electronically Signed   By: Fidela Salisbury MD   On: 05/27/2020 23:12    Anti-infectives: Anti-infectives (From admission, onward)   None       Assessment/Plan 75 yo male on maintenance treatment for esophageal cancer with recent acute pancreatitis, 1 month s/p lap cholecystectomy with persistent abdominal pain and new-onset obstructive jaundice. MRCP shows significant biliary ductal dilation with a distal CBD stricture and associated mass in the head of the pancreas. - GI consult this morning for EUS/ERCP - NPO for possible procedure, maintenance IV fluids - Pain and nausea control - Patient does not currently have signs of cholangitis, will hold antibiotics for now - VTE: SCDs, chemical DVT ppx on hold for possible procedure - Dispo: inpatient    LOS: 0 days    Michaelle Birks, MD [facility] Surgery General, Hepatobiliary and Pancreatic Surgery 05/28/20 7:12 AM

## 2020-05-28 NOTE — H&P (View-Only) (Signed)
 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 Evaluated at [facility], endoscopic resection failed and he was not felt to be surgical candidate due to comorbidities. Treated with chemotherapy / radiation. Post treatment PET showed no residual disease. Surveillance EGD with biopsies March 2021 showed residual vs local recurrent disease. He was started on maintenance Xeloda, recently held due to pancreatitis ( possibly gallstone related) .   # Obstructive jaundice with CBD stricture and pancreatic head mass on MRCP. Extensive retroperitoneal adenopathy.  --Ideally patient needs EUS and ERCP for biliary decompression. He has no dysphagia, hopefully there will not be any issues passing scope through esophagus. Will discuss with Dr. Beavers  # Myasthenia gravis on Cellcept and Mestinon.      HPI:                                                                                                                             Chief Complaint: abdominal pain , jaundice  Philip [name] is a 74 y.o. male chronic GERD, short segment Barrett's esophagus diagnosed in 2019 with esophageal adenocarcinoma. eal cancer, OSA, myasthenia gravis  Patient has a longstanding GERD Barrett's esophagus. Surveillance EGD in Sept 2019 with findings of an esophageal nodule. Biopsies c/w with poorly differentiated adenocarcinoma with signet ring cells in background of Barrett's esophagus.  He underwent chemo/ radiation. Surveillance EGD Aug 2020 showed friable esophageal mucosa but path c/w reflux esophagitis. No dysplasia or malignancy. Repeat Surveillance EGD in March 2021 showed an esophageal nodule and path c/w adenocarcinoma.   Patient was  hospitalized late December with gallstone pancreatitis, underwent lap cholecystectomy 04/23/20. He has continued to have abdominal pain. Recent labs showed elevated bilirubin. He was admitted yesterday by [facility]. MRCP shows malignant CBD stricture and head of pancreas mass. Alk phos 493, AST 224, ALT 219, Tbili 8.8.   Patient reports intermittent RUQ / epigastric pain and nausea. Pain never really improved following gallstones pancreatitis / cholecystectomy a month ago. No vomiting but dry heaves and diminished appetite.   PREVIOUS ENDOSCOPIC EVALUATIONS / PERTINENT STUDIES   Aug 2020 Surveillance EGD -Esophageal mucosal changes secondary to established short-segment Barrett's disease. - Erythematous, friable (with contact bleeding), nodular mucosa in the esophagus. Biopsied. May be inflammation from radiation. - Erythematous and petechial mucosa in the gastric fundus and gastric body. Appears likely related to radiation. - Multiple fundic gland polyps. - Eroded and nodular mucosa in the antrum. Biopsied. - Submucosal nodule found in the duodenum. Stable in size from prior exams.  March 2021 Surveillance EGD  -Nodule found in the esophagus. Biopsied to rule out recurrent malignancy   vs radiation effect. - Food (residue) in the stomach. - Retained food in the duodenum. Path POORLY DIFFERENTIATED ADENOCARCINOMA WITH SIGNET RING FEATURES  05/27/20 MRCP IMPRESSION: Interval development of a solid mass within the head of the pancreas in most in keeping with a primary pancreatic malignancy with resultant malignant stricture of the distal common duct and resultant marked intra and extrahepatic biliary ductal dilation.  Extensive pathologic adenopathy within the retroperitoneum involving the retrocrural, retrocaval, aortocaval and left periaortic lymph node groups. Pathologic adenopathy also noted within the gastrohepatic ligament and periportal lymph node groups. Possible metastatic pulmonary  nodules within the left lower lobe. Dedicated CT imaging of the chest, abdomen, and pelvis is recommended for further evaluation. Retroperitoneal adenopathy should provide a ready target for CT-guided biopsy for definitive diagnosis.  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 The portal vein, however, is still patent.  Area of focal fibrosis within the mid body of the pancreas, likely the sequela of remote pancreatitis.  Status post cholecystectomy.    Past Medical History:  Diagnosis Date  . Abdominal hernia   . Asthma    mild  . Barrett's esophagus   . Cancer (HCC)    Esophagel cancer  . Cataract    Bil surgery  . Cough    over 2 weeks  . Fluid retention in legs   . GERD (gastroesophageal reflux disease)   . H/O hiatal hernia   . Hypertension   . Myasthenia gravis ([place])   . Sleep apnea    don't use c-pap at home/ tried for several years  . SOB (shortness of breath)     Past Surgical History:  Procedure Laterality Date  . BACK SURGERY     5th lumbar  . BALLOON DILATION N/A 10/19/2016   Procedure: BALLOON DILATION;  Surgeon: Ladene Artist, MD;  Location: [facility];  Service: Endoscopy;  Laterality: N/A;  . CHOLECYSTECTOMY N/A 04/23/2020   Procedure: LAPAROSCOPIC CHOLECYSTECTOMY WITH INTRAOPERATIVE CHOLANGIOGRAM;  Surgeon: [name], MD;  Location: [facility];  Service: General;  Laterality: N/A;  . COLONOSCOPY W/ POLYPECTOMY    . ESOPHAGOGASTRODUODENOSCOPY N/A 10/19/2016   Procedure: ESOPHAGOGASTRODUODENOSCOPY (EGD);  Surgeon: Ladene Artist, MD;  Location: [facility];  Service: Endoscopy;  Laterality: N/A;  . ESOPHAGOGASTRODUODENOSCOPY (EGD) WITH PROPOFOL N/A 02/12/2018   Procedure: ESOPHAGOGASTRODUODENOSCOPY (EGD) WITH PROPOFOL;  Surgeon: [name], MD;  Location: [place];  Service: Gastroenterology;  Laterality: N/A;  . EUS N/A 02/12/2018   Procedure: UPPER ENDOSCOPIC ULTRASOUND (EUS)  RADIAL;  Surgeon: [name], MD;  Location: [place];  Service: Gastroenterology;  Laterality: N/A;  . EYE SURGERY Bilateral    cataract  . LUMBAR LAMINECTOMY     1980's    Prior to Admission medications   Medication Sig Start Date End Date Taking? Authorizing Provider  albuterol (VENTOLIN HFA) 108 (90 Base) MCG/ACT inhaler Inhale 2 puffs into the lungs every 4 (four) hours as needed for wheezing or shortness of breath.   Yes [provider]  amitriptyline (ELAVIL) 50 MG tablet Take 50 mg by mouth at bedtime. 08/30/19  Yes [provider]  aspirin EC 81 MG tablet Take 81 mg by mouth daily.   Yes [provider]  atorvastatin (LIPITOR) 10 MG tablet Take 10 mg by mouth at bedtime.  03/22/16  Yes [provider]  etodolac (LODINE) 200 MG capsule Take 200 mg by mouth every 8 (eight) hours as needed for moderate pain.  Yes [provider]  famotidine (PEPCID) 20 MG tablet TAKE ONE TABLET BY MOUTH TWICE A DAY Patient taking differently: Take 20 mg by mouth 2 (two) times daily. 04/06/20  Yes Truitt Merle, MD  HYDROcodone-acetaminophen (NORCO) 7.5-325 MG tablet Take 1 tablet by mouth 4 (four) times daily as needed for severe pain. 04/23/20  Yes [name], MD  lisinopril (ZESTRIL) 20 MG tablet Take 20 mg by mouth daily. 06/01/19  Yes [provider]  Multiple Vitamin (MULTIVITAMIN) tablet Take 1 tablet by mouth daily.   Yes [provider]  mycophenolate (CELLCEPT) 500 MG tablet Take 2 tablets (1,000 mg total) by mouth 2 (two) times daily. 02/10/20  Yes Penumalli, Earlean Polka, MD  ondansetron (ZOFRAN) 8 MG tablet Take 1 tablet (8 mg total) by mouth 2 (two) times daily as needed for refractory nausea / vomiting. Start on day 3 after chemotherapy. 08/14/19  Yes Truitt Merle, MD  prochlorperazine (COMPAZINE) 10 MG tablet Take 1 tablet (10 mg total) by mouth every 6 (six) hours as needed (Nausea or vomiting). 08/14/19  Yes Truitt Merle,  MD  pyridostigmine (MESTINON) 60 MG tablet Take 0.5-1 tablets (30-60 mg total) by mouth 3 (three) times daily. Patient taking differently: Take 60 mg by mouth in the morning and at bedtime. 02/10/20  Yes Penumalli, Earlean Polka, MD  XELODA 500 MG tablet Take 3 tablets (1,500 mg total) by mouth 2 (two) times daily after a meal. Take for 14 days, then hold for 7 days. Repeat every 21 days. Patient not taking: Reported on 04/21/2020 02/13/20   Truitt Merle, MD    Current Facility-Administered Medications  Medication Dose Route Frequency Provider Last Rate Last Admin  . 0.9 %  sodium chloride infusion  500 mL Intravenous Once Nelida Meuse III, MD      . 0.9 %  sodium chloride infusion   Intravenous Continuous Autumn Messing III, MD 100 mL/hr at 05/27/20 2354 New Bag at 05/27/20 2354  . albuterol (VENTOLIN HFA) 108 (90 Base) MCG/ACT inhaler 2 puff  2 puff Inhalation Q4H PRN Autumn Messing III, MD      . amitriptyline (ELAVIL) tablet 50 mg  50 mg Oral QHS Autumn Messing III, MD   50 mg at 05/27/20 2355  . atorvastatin (LIPITOR) tablet 10 mg  10 mg Oral QHS [name], MD      . famotidine (PEPCID) tablet 20 mg  20 mg Oral BID Michaelle Birks L, MD      . HYDROmorphone (DILAUDID) injection 1 mg  1 mg Intravenous Q2H PRN [name], MD   1 mg at 05/28/20 0721  . lisinopril (ZESTRIL) tablet 20 mg  20 mg Oral Daily Autumn Messing III, MD      . mycophenolate (CELLCEPT) capsule 1,000 mg  1,000 mg Oral BID Autumn Messing III, MD   1,000 mg at 05/27/20 2356  . ondansetron (ZOFRAN-ODT) disintegrating tablet 4 mg  4 mg Oral Q6H PRN Autumn Messing III, MD       Or  . ondansetron [facility]) injection 4 mg  4 mg Intravenous Q6H PRN Autumn Messing III, MD      . oxyCODONE (Oxy IR/ROXICODONE) immediate release tablet 5-10 mg  5-10 mg Oral Q4H PRN [name], MD   10 mg at 05/28/20 0724  . pantoprazole (PROTONIX) injection 40 mg  40 mg Intravenous QHS Autumn Messing III, MD   40 mg at 05/27/20 2352  . prochlorperazine (COMPAZINE) tablet 10  mg  10  mg Oral Q6H PRN Toth, [name] III, MD      . pyridostigmine (MESTINON) tablet 30-60 mg  30-60 mg Oral TID Toth, [name] III, MD   60 mg at 05/27/20 2356   Current Outpatient Medications  Medication Sig Dispense Refill  . albuterol (VENTOLIN HFA) 108 (90 Base) MCG/ACT inhaler Inhale 2 puffs into the lungs every 4 (four) hours as needed for wheezing or shortness of breath.    . amitriptyline (ELAVIL) 50 MG tablet Take 50 mg by mouth at bedtime.    . aspirin EC 81 MG tablet Take 81 mg by mouth daily.    . atorvastatin (LIPITOR) 10 MG tablet Take 10 mg by mouth at bedtime.     . etodolac (LODINE) 200 MG capsule Take 200 mg by mouth every 8 (eight) hours as needed for moderate pain.    . famotidine (PEPCID) 20 MG tablet TAKE ONE TABLET BY MOUTH TWICE A DAY (Patient taking differently: Take 20 mg by mouth 2 (two) times daily.) 60 tablet 1  . HYDROcodone-acetaminophen (NORCO) 7.5-325 MG tablet Take 1 tablet by mouth 4 (four) times daily as needed for severe pain. 20 tablet 0  . lisinopril (ZESTRIL) 20 MG tablet Take 20 mg by mouth daily.    . Multiple Vitamin (MULTIVITAMIN) tablet Take 1 tablet by mouth daily.    . mycophenolate (CELLCEPT) 500 MG tablet Take 2 tablets (1,000 mg total) by mouth 2 (two) times daily. 120 tablet 12  . ondansetron (ZOFRAN) 8 MG tablet Take 1 tablet (8 mg total) by mouth 2 (two) times daily as needed for refractory nausea / vomiting. Start on day 3 after chemotherapy. 30 tablet 1  . prochlorperazine (COMPAZINE) 10 MG tablet Take 1 tablet (10 mg total) by mouth every 6 (six) hours as needed (Nausea or vomiting). 30 tablet 1  . pyridostigmine (MESTINON) 60 MG tablet Take 0.5-1 tablets (30-60 mg total) by mouth 3 (three) times daily. (Patient taking differently: Take 60 mg by mouth in the morning and at bedtime.) 90 tablet 12  . XELODA 500 MG tablet Take 3 tablets (1,500 mg total) by mouth 2 (two) times daily after a meal. Take for 14 days, then hold for 7 days. Repeat every 21  days. (Patient not taking: Reported on 04/21/2020) 84 tablet 2    Allergies as of 05/27/2020  . (No Known Allergies)    Family History  Problem Relation Age of Onset  . Heart disease Mother   . Lung cancer Father   . Colon cancer Neg Hx   . Esophageal cancer Neg Hx   . Stomach cancer Neg Hx   . Rectal cancer Neg Hx     Social History   Socioeconomic History  . Marital status: Single    Spouse name: Not on file  . Number of children: 0  . Years of education: 12  . Highest education level: Not on file  Occupational History    Comment: retired  Tobacco Use  . Smoking status: Former Smoker    Packs/day: 1.00    Years: 7.00    Pack years: 7.00    Quit date: 12/21/1976    Years since quitting: 43.4  . Smokeless tobacco: Never Used  Vaping Use  . Vaping Use: Never used  Substance and Sexual Activity  . Alcohol use: Yes    Comment: occ  . Drug use: No  . Sexual activity: Not on file  Other Topics Concern  . Not on file  Social History Narrative     Lives alone   Caffeine- coffee, tea, sodas 1/2 gallon total daily   Social Determinants of Health   Financial Resource Strain: Not on file  Food Insecurity: Not on file  Transportation Needs: Not on file  Physical Activity: Not on file  Stress: Not on file  Social Connections: Not on file  Intimate Partner Violence: Not on file    Review of Systems: All systems reviewed and negative except where noted in HPI.  OBJECTIVE:    Physical Exam: Vital signs in last 24 hours: Temp:  [98.3 F (36.8 C)-98.4 F (36.9 C)] 98.3 F (36.8 C) (01/26 2259) Pulse Rate:  [98-107] 106 (01/27 0630) Resp:  [13-20] 15 (01/27 0630) BP: (121-141)/(54-88) 137/88 (01/27 0630) SpO2:  [96 %-100 %] 99 % (01/27 0630) Weight:  [113.4 kg] 113.4 kg (01/26 2259)   General:   Alert  Obese male in NAD Psych:  Pleasant, cooperative. Normal mood and affect. Eyes:  Pupils equal, sclera clear, no icterus.   Conjunctiva pink. Ears:  Normal  auditory acuity. Nose:  No deformity, discharge,  or lesions. Neck:  Supple; no masses Lungs:  Clear throughout to auscultation.   No wheezes, crackles, or rhonchi.  Heart:  Regular rate and rhythm; 1-2+ bilateral pedal edema Abdomen:  Soft, obese, mild RUQ tenderness.  BS active Rectal:  Deferred  Msk:  Symmetrical without gross deformities. . Neurologic:  Alert and  oriented x4;  grossly normal neurologically. Skin:  Intact without significant lesions or rashes.  Filed Weights   05/27/20 1847 05/27/20 2259  Weight: 113.4 kg 113.4 kg     Scheduled inpatient medications . amitriptyline  50 mg Oral QHS  . atorvastatin  10 mg Oral QHS  . famotidine  20 mg Oral BID  . lisinopril  20 mg Oral Daily  . mycophenolate  1,000 mg Oral BID  . pantoprazole (PROTONIX) IV  40 mg Intravenous QHS  . pyridostigmine  30-60 mg Oral TID      Intake/Output from previous day: No intake/output data recorded. Intake/Output this shift: No intake/output data recorded.   Lab Results: Recent Labs    05/28/20 0235  WBC 7.8  HGB 10.9*  HCT 33.0*  PLT 214   BMET Recent Labs    05/28/20 0235  NA 130*  K 4.2  CL 97*  CO2 25  GLUCOSE 118*  BUN 16  CREATININE 0.66  CALCIUM 10.2   LFT Recent Labs    05/28/20 0235  PROT 5.7*  ALBUMIN 3.2*  AST 224*  ALT 219*  ALKPHOS 493*  BILITOT 8.8*   PT/INR No results for input(s): LABPROT, INR in the last 72 hours. Hepatitis Panel No results for input(s): HEPBSAG, HCVAB, HEPAIGM, HEPBIGM in the last 72 hours.   . CBC Latest Ref Rng & Units 05/28/2020 05/12/2020 04/21/2020  WBC 4.0 - 10.5 K/uL 7.8 11.9(H) 6.0  Hemoglobin 13.0 - 17.0 g/dL 10.9(L) 13.0 12.8(L)  Hematocrit 39.0 - 52.0 % 33.0(L) 37.4(L) 36.9(L)  Platelets 150 - 400 K/uL 214 275 240    . CMP Latest Ref Rng & Units 05/28/2020 05/12/2020 04/21/2020  Glucose 70 - 99 mg/dL 118(H) 175(H) 137(H)  BUN 8 - 23 mg/dL 16 15 20  Creatinine 0.61 - 1.24 mg/dL 0.66 0.76 0.84  Sodium 135  - 145 mmol/L 130(L) 133(L) 132(L)  Potassium 3.5 - 5.1 mmol/L 4.2 4.7 4.7  Chloride 98 - 111 mmol/L 97(L) 105 105  CO2 22 - 32 mmol/L 25 23 20(L)  Calcium 8.9 - 10.3 mg/dL 10.2   11.4(H) 11.1(H)  Total Protein 6.5 - 8.1 g/dL 5.7(L) 6.5 6.5  Total Bilirubin 0.3 - 1.2 mg/dL 8.8(H) 0.7 1.1  Alkaline Phos 38 - 126 U/L 493(H) 115 99  AST 15 - 41 U/L 224(H) 24 27  ALT 0 - 44 U/L 219(H) 22 23   Studies/Results: MR 3D Recon At Scanner  Result Date: 05/27/2020 CLINICAL DATA:  Jaundice, right upper quadrant abdominal pain, recent cholecystectomy EXAM: MRI ABDOMEN WITHOUT AND WITH CONTRAST (INCLUDING MRCP) TECHNIQUE: Multiplanar multisequence MR imaging of the abdomen was performed both before and after the administration of intravenous contrast. Heavily T2-weighted images of the biliary and pancreatic ducts were obtained, and three-dimensional MRCP images were rendered by post processing. CONTRAST:  10mL GADAVIST GADOBUTROL 1 MMOL/ML IV SOLN COMPARISON:  Intraoperative cholangiogram 04/23/2020, CT abdomen pelvis 04/02/2020 FINDINGS: Lower chest: There is pathologic retrocrural adenopathy with the index lymph node measuring 2.0 x 3.8 cm on image # 20/41. Multiple periaortic lymph nodes or potentially left lower lobe pulmonary nodules are also identified in this region Hepatobiliary: Normal hepatic parenchymal signal intensity. There is marked intra and extrahepatic biliary ductal dilation to the level of the pancreatic head. Within the pancreatic head, there is a malignant appearing stricture involving the distal 3 cm of the a common duct with malignant shouldering identified. Tiny cyst noted within segment 4 of the liver. No enhancing liver mass identified. There is pathologic periportal adenopathy at the liver hilum with the index lymph node measuring 1.5 x 2.7 cm at image # 13/21. Status post cholecystectomy. Pancreas: There has developed a a solid mass within the head of the pancreas measuring 3.6 x 4.1 cm at  axial image 24/3 which abuts and narrows the main portal vein,. The common hepatic artery appears distinct from the mass as does the superior mesenteric artery. Separately, the main pancreatic duct within the tail the pancreas appears mildly dilated and there is atrophy of the tail the pancreas. The body of the pancreas appears mildly thickened,, demonstrating decreased signal intensity on T1 and T2 weighted imaging and relative hypoenhancement with increasing enhance mint noted on delayed phase imaging in keeping with focal fibrosis within the mid body of the pancreas, likely sequela of remote pancreatitis. Spleen:  Unremarkable Adrenals/Urinary Tract: The adrenal glands are unremarkable. Simple cortical cyst noted within the lower pole of the right kidney. The kidneys are otherwise unremarkable. Stomach/Bowel: The stomach and visualized small and large bowel are unremarkable. Vascular/Lymphatic: As noted above, the main portal vein is narrowed by the abutting pancreatic mass, but is still patent. The abdominal vasculature is otherwise unremarkable. There is extensive pathologic adenopathy within the gastrohepatic ligament and retroperitoneum within the periaortic, aortocaval, and retrocaval lymph node groups. This matted conglomerate of pathologic adenopathy measures 4.3 x 9.7 cm in greatest dimension on image 25/3. Other:  None Musculoskeletal: Normal bone marrow signal intensity IMPRESSION: Interval development of a solid mass within the head of the pancreas in most in keeping with a primary pancreatic malignancy with resultant malignant stricture of the distal common duct and resultant marked intra and extrahepatic biliary ductal dilation. Extensive pathologic adenopathy within the retroperitoneum involving the retrocrural, retrocaval, aortocaval and left periaortic lymph node groups. Pathologic adenopathy also noted within the gastrohepatic ligament and periportal lymph node groups. Possible metastatic  pulmonary nodules within the left lower lobe. Dedicated CT imaging of the chest, abdomen, and pelvis is recommended for further evaluation. Retroperitoneal adenopathy should provide a ready target for CT-guided biopsy for definitive diagnosis. Abutment of the main   portal vein with secondary narrowing related to the pancreatic mass. The portal vein, however, is still patent. Area of focal fibrosis within the mid body of the pancreas, likely the sequela of remote pancreatitis. Status post cholecystectomy. Electronically Signed   By: Ashesh  Parikh MD   On: 05/27/2020 23:12   MR ABDOMEN MRCP W WO CONTAST  Result Date: 05/27/2020 CLINICAL DATA:  Jaundice, right upper quadrant abdominal pain, recent cholecystectomy EXAM: MRI ABDOMEN WITHOUT AND WITH CONTRAST (INCLUDING MRCP) TECHNIQUE: Multiplanar multisequence MR imaging of the abdomen was performed both before and after the administration of intravenous contrast. Heavily T2-weighted images of the biliary and pancreatic ducts were obtained, and three-dimensional MRCP images were rendered by post processing. CONTRAST:  10mL GADAVIST GADOBUTROL 1 MMOL/ML IV SOLN COMPARISON:  Intraoperative cholangiogram 04/23/2020, CT abdomen pelvis 04/02/2020 FINDINGS: Lower chest: There is pathologic retrocrural adenopathy with the index lymph node measuring 2.0 x 3.8 cm on image # 20/41. Multiple periaortic lymph nodes or potentially left lower lobe pulmonary nodules are also identified in this region Hepatobiliary: Normal hepatic parenchymal signal intensity. There is marked intra and extrahepatic biliary ductal dilation to the level of the pancreatic head. Within the pancreatic head, there is a malignant appearing stricture involving the distal 3 cm of the a common duct with malignant shouldering identified. Tiny cyst noted within segment 4 of the liver. No enhancing liver mass identified. There is pathologic periportal adenopathy at the liver hilum with the index lymph node  measuring 1.5 x 2.7 cm at image # 13/21. Status post cholecystectomy. Pancreas: There has developed a a solid mass within the head of the pancreas measuring 3.6 x 4.1 cm at axial image 24/3 which abuts and narrows the main portal vein,. The common hepatic artery appears distinct from the mass as does the superior mesenteric artery. Separately, the main pancreatic duct within the tail the pancreas appears mildly dilated and there is atrophy of the tail the pancreas. The body of the pancreas appears mildly thickened,, demonstrating decreased signal intensity on T1 and T2 weighted imaging and relative hypoenhancement with increasing enhance mint noted on delayed phase imaging in keeping with focal fibrosis within the mid body of the pancreas, likely sequela of remote pancreatitis. Spleen:  Unremarkable Adrenals/Urinary Tract: The adrenal glands are unremarkable. Simple cortical cyst noted within the lower pole of the right kidney. The kidneys are otherwise unremarkable. Stomach/Bowel: The stomach and visualized small and large bowel are unremarkable. Vascular/Lymphatic: As noted above, the main portal vein is narrowed by the abutting pancreatic mass, but is still patent. The abdominal vasculature is otherwise unremarkable. There is extensive pathologic adenopathy within the gastrohepatic ligament and retroperitoneum within the periaortic, aortocaval, and retrocaval lymph node groups. This matted conglomerate of pathologic adenopathy measures 4.3 x 9.7 cm in greatest dimension on image 25/3. Other:  None Musculoskeletal: Normal bone marrow signal intensity IMPRESSION: Interval development of a solid mass within the head of the pancreas in most in keeping with a primary pancreatic malignancy with resultant malignant stricture of the distal common duct and resultant marked intra and extrahepatic biliary ductal dilation. Extensive pathologic adenopathy within the retroperitoneum involving the retrocrural, retrocaval,  aortocaval and left periaortic lymph node groups. Pathologic adenopathy also noted within the gastrohepatic ligament and periportal lymph node groups. Possible metastatic pulmonary nodules within the left lower lobe. Dedicated CT imaging of the chest, abdomen, and pelvis is recommended for further evaluation. Retroperitoneal adenopathy should provide a ready target for CT-guided biopsy for definitive diagnosis. Abutment of the   main portal vein with secondary narrowing related to the pancreatic mass. The portal vein, however, is still patent. Area of focal fibrosis within the mid body of the pancreas, likely the sequela of remote pancreatitis. Status post cholecystectomy. Electronically Signed   By: Ashesh  Parikh MD   On: 05/27/2020 23:12    Active Problems:   Hyperbilirubinemia    Gage Weant, NP-C @  05/28/2020, 9:37 AM     

## 2020-05-28 NOTE — Consult Note (Signed)
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 Evaluated at [facility], endoscopic resection failed and he was not felt to be surgical candidate due to comorbidities. Treated with chemotherapy / radiation. Post treatment PET showed no residual disease. Surveillance EGD with biopsies March 2021 showed residual vs local recurrent disease. He was started on maintenance Xeloda, recently held due to pancreatitis ( possibly gallstone related) .   # Obstructive jaundice with CBD stricture and pancreatic head mass on MRCP. Extensive retroperitoneal adenopathy.  --Ideally patient needs EUS and ERCP for biliary decompression. He has no dysphagia, hopefully there will not be any issues passing scope through esophagus. Will discuss with Dr. Tarri Glenn  # Myasthenia gravis on Cellcept and Mestinon.      HPI:                                                                                                                             Chief Complaint: abdominal pain , jaundice  Philip [name] is a 75 y.o. male chronic GERD, short segment Barrett's esophagus diagnosed in 2019 with esophageal adenocarcinoma. eal cancer, OSA, myasthenia gravis  Patient has a longstanding GERD Barrett's esophagus. Surveillance EGD in Sept 2019 with findings of an esophageal nodule. Biopsies c/w with poorly differentiated adenocarcinoma with signet ring cells in background of Barrett's esophagus.  He underwent chemo/ radiation. Surveillance EGD Aug 2020 showed friable esophageal mucosa but path c/w reflux esophagitis. No dysplasia or malignancy. Repeat Surveillance EGD in March 2021 showed an esophageal nodule and path c/w adenocarcinoma.   Patient was  hospitalized late December with gallstone pancreatitis, underwent lap cholecystectomy 04/23/20. He has continued to have abdominal pain. Recent labs showed elevated bilirubin. He was admitted yesterday by [facility]. MRCP shows malignant CBD stricture and head of pancreas mass. Alk phos 493, AST 224, ALT 219, Tbili 8.8.   Patient reports intermittent RUQ / epigastric pain and nausea. Pain never really improved following gallstones pancreatitis / cholecystectomy a month ago. No vomiting but dry heaves and diminished appetite.   PREVIOUS ENDOSCOPIC EVALUATIONS / PERTINENT STUDIES   Aug 2020 Surveillance EGD -Esophageal mucosal changes secondary to established short-segment Barrett's disease. - Erythematous, friable (with contact bleeding), nodular mucosa in the esophagus. Biopsied. May be inflammation from radiation. - Erythematous and petechial mucosa in the gastric fundus and gastric body. Appears likely related to radiation. - Multiple fundic gland polyps. - Eroded and nodular mucosa in the antrum. Biopsied. - Submucosal nodule found in the duodenum. Stable in size from prior exams.  March 2021 Surveillance EGD  -Nodule found in the esophagus. Biopsied to rule out recurrent malignancy  vs radiation effect. - Food (residue) in the stomach. - Retained food in the duodenum. Path POORLY DIFFERENTIATED ADENOCARCINOMA WITH SIGNET RING FEATURES  05/27/20 MRCP IMPRESSION: Interval development of a solid mass within the head of the pancreas in most in keeping with a primary pancreatic malignancy with resultant malignant stricture of the distal common duct and resultant marked intra and extrahepatic biliary ductal dilation.  Extensive pathologic adenopathy within the retroperitoneum involving the retrocrural, retrocaval, aortocaval and left periaortic lymph node groups. Pathologic adenopathy also noted within the gastrohepatic ligament and periportal lymph node groups. Possible metastatic pulmonary  nodules within the left lower lobe. Dedicated CT imaging of the chest, abdomen, and pelvis is recommended for further evaluation. Retroperitoneal adenopathy should provide a ready target for CT-guided biopsy for definitive diagnosis.  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 The portal vein, however, is still patent.  Area of focal fibrosis within the mid body of the pancreas, likely the sequela of remote pancreatitis.  Status post cholecystectomy.    Past Medical History:  Diagnosis Date  . Abdominal hernia   . Asthma    mild  . Barrett's esophagus   . Cancer (HCC)    Esophagel cancer  . Cataract    Bil surgery  . Cough    over 2 weeks  . Fluid retention in legs   . GERD (gastroesophageal reflux disease)   . H/O hiatal hernia   . Hypertension   . Myasthenia gravis ([place])   . Sleep apnea    don't use c-pap at home/ tried for several years  . SOB (shortness of breath)     Past Surgical History:  Procedure Laterality Date  . BACK SURGERY     5th lumbar  . BALLOON DILATION N/A 10/19/2016   Procedure: BALLOON DILATION;  Surgeon: Ladene Artist, MD;  Location: [facility];  Service: Endoscopy;  Laterality: N/A;  . CHOLECYSTECTOMY N/A 04/23/2020   Procedure: LAPAROSCOPIC CHOLECYSTECTOMY WITH INTRAOPERATIVE CHOLANGIOGRAM;  Surgeon: [name], MD;  Location: [facility];  Service: General;  Laterality: N/A;  . COLONOSCOPY W/ POLYPECTOMY    . ESOPHAGOGASTRODUODENOSCOPY N/A 10/19/2016   Procedure: ESOPHAGOGASTRODUODENOSCOPY (EGD);  Surgeon: Ladene Artist, MD;  Location: [facility];  Service: Endoscopy;  Laterality: N/A;  . ESOPHAGOGASTRODUODENOSCOPY (EGD) WITH PROPOFOL N/A 02/12/2018   Procedure: ESOPHAGOGASTRODUODENOSCOPY (EGD) WITH PROPOFOL;  Surgeon: [name], MD;  Location: [place];  Service: Gastroenterology;  Laterality: N/A;  . EUS N/A 02/12/2018   Procedure: UPPER ENDOSCOPIC ULTRASOUND (EUS)  RADIAL;  Surgeon: [name], MD;  Location: [place];  Service: Gastroenterology;  Laterality: N/A;  . EYE SURGERY Bilateral    cataract  . LUMBAR LAMINECTOMY     1980's    Prior to Admission medications   Medication Sig Start Date End Date Taking? Authorizing Provider  albuterol (VENTOLIN HFA) 108 (90 Base) MCG/ACT inhaler Inhale 2 puffs into the lungs every 4 (four) hours as needed for wheezing or shortness of breath.   Yes [provider]  amitriptyline (ELAVIL) 50 MG tablet Take 50 mg by mouth at bedtime. 08/30/19  Yes [provider]  aspirin EC 81 MG tablet Take 81 mg by mouth daily.   Yes [provider]  atorvastatin (LIPITOR) 10 MG tablet Take 10 mg by mouth at bedtime.  03/22/16  Yes [provider]  etodolac (LODINE) 200 MG capsule Take 200 mg by mouth every 8 (eight) hours as needed for moderate pain.  Yes [provider]  famotidine (PEPCID) 20 MG tablet TAKE ONE TABLET BY MOUTH TWICE A DAY Patient taking differently: Take 20 mg by mouth 2 (two) times daily. 04/06/20  Yes Truitt Merle, MD  HYDROcodone-acetaminophen (NORCO) 7.5-325 MG tablet Take 1 tablet by mouth 4 (four) times daily as needed for severe pain. 04/23/20  Yes [name], MD  lisinopril (ZESTRIL) 20 MG tablet Take 20 mg by mouth daily. 06/01/19  Yes [provider]  Multiple Vitamin (MULTIVITAMIN) tablet Take 1 tablet by mouth daily.   Yes [provider]  mycophenolate (CELLCEPT) 500 MG tablet Take 2 tablets (1,000 mg total) by mouth 2 (two) times daily. 02/10/20  Yes Penumalli, Earlean Polka, MD  ondansetron (ZOFRAN) 8 MG tablet Take 1 tablet (8 mg total) by mouth 2 (two) times daily as needed for refractory nausea / vomiting. Start on day 3 after chemotherapy. 08/14/19  Yes Truitt Merle, MD  prochlorperazine (COMPAZINE) 10 MG tablet Take 1 tablet (10 mg total) by mouth every 6 (six) hours as needed (Nausea or vomiting). 08/14/19  Yes Truitt Merle,  MD  pyridostigmine (MESTINON) 60 MG tablet Take 0.5-1 tablets (30-60 mg total) by mouth 3 (three) times daily. Patient taking differently: Take 60 mg by mouth in the morning and at bedtime. 02/10/20  Yes Penumalli, Earlean Polka, MD  XELODA 500 MG tablet Take 3 tablets (1,500 mg total) by mouth 2 (two) times daily after a meal. Take for 14 days, then hold for 7 days. Repeat every 21 days. Patient not taking: Reported on 04/21/2020 02/13/20   Truitt Merle, MD    Current Facility-Administered Medications  Medication Dose Route Frequency Provider Last Rate Last Admin  . 0.9 %  sodium chloride infusion  500 mL Intravenous Once Nelida Meuse III, MD      . 0.9 %  sodium chloride infusion   Intravenous Continuous Autumn Messing III, MD 100 mL/hr at 05/27/20 2354 New Bag at 05/27/20 2354  . albuterol (VENTOLIN HFA) 108 (90 Base) MCG/ACT inhaler 2 puff  2 puff Inhalation Q4H PRN Autumn Messing III, MD      . amitriptyline (ELAVIL) tablet 50 mg  50 mg Oral QHS Autumn Messing III, MD   50 mg at 05/27/20 2355  . atorvastatin (LIPITOR) tablet 10 mg  10 mg Oral QHS [name], MD      . famotidine (PEPCID) tablet 20 mg  20 mg Oral BID Michaelle Birks L, MD      . HYDROmorphone (DILAUDID) injection 1 mg  1 mg Intravenous Q2H PRN [name], MD   1 mg at 05/28/20 0721  . lisinopril (ZESTRIL) tablet 20 mg  20 mg Oral Daily Autumn Messing III, MD      . mycophenolate (CELLCEPT) capsule 1,000 mg  1,000 mg Oral BID Autumn Messing III, MD   1,000 mg at 05/27/20 2356  . ondansetron (ZOFRAN-ODT) disintegrating tablet 4 mg  4 mg Oral Q6H PRN Autumn Messing III, MD       Or  . ondansetron [facility]) injection 4 mg  4 mg Intravenous Q6H PRN Autumn Messing III, MD      . oxyCODONE (Oxy IR/ROXICODONE) immediate release tablet 5-10 mg  5-10 mg Oral Q4H PRN [name], MD   10 mg at 05/28/20 0724  . pantoprazole (PROTONIX) injection 40 mg  40 mg Intravenous QHS Autumn Messing III, MD   40 mg at 05/27/20 2352  . prochlorperazine (COMPAZINE) tablet 10  mg  10  mg Oral Q6H PRN Autumn Messing III, MD      . pyridostigmine (MESTINON) tablet 30-60 mg  30-60 mg Oral TID Autumn Messing III, MD   60 mg at 05/27/20 2356   Current Outpatient Medications  Medication Sig Dispense Refill  . albuterol (VENTOLIN HFA) 108 (90 Base) MCG/ACT inhaler Inhale 2 puffs into the lungs every 4 (four) hours as needed for wheezing or shortness of breath.    [name] amitriptyline (ELAVIL) 50 MG tablet Take 50 mg by mouth at bedtime.    [name] aspirin EC 81 MG tablet Take 81 mg by mouth daily.    [name] atorvastatin (LIPITOR) 10 MG tablet Take 10 mg by mouth at bedtime.     [name] etodolac (LODINE) 200 MG capsule Take 200 mg by mouth every 8 (eight) hours as needed for moderate pain.    . famotidine (PEPCID) 20 MG tablet TAKE ONE TABLET BY MOUTH TWICE A DAY (Patient taking differently: Take 20 mg by mouth 2 (two) times daily.) 60 tablet 1  . HYDROcodone-acetaminophen (NORCO) 7.5-325 MG tablet Take 1 tablet by mouth 4 (four) times daily as needed for severe pain. 20 tablet 0  . lisinopril (ZESTRIL) 20 MG tablet Take 20 mg by mouth daily.    . Multiple Vitamin (MULTIVITAMIN) tablet Take 1 tablet by mouth daily.    . mycophenolate (CELLCEPT) 500 MG tablet Take 2 tablets (1,000 mg total) by mouth 2 (two) times daily. 120 tablet 12  . ondansetron (ZOFRAN) 8 MG tablet Take 1 tablet (8 mg total) by mouth 2 (two) times daily as needed for refractory nausea / vomiting. Start on day 3 after chemotherapy. 30 tablet 1  . prochlorperazine (COMPAZINE) 10 MG tablet Take 1 tablet (10 mg total) by mouth every 6 (six) hours as needed (Nausea or vomiting). 30 tablet 1  . pyridostigmine (MESTINON) 60 MG tablet Take 0.5-1 tablets (30-60 mg total) by mouth 3 (three) times daily. (Patient taking differently: Take 60 mg by mouth in the morning and at bedtime.) 90 tablet 12  . XELODA 500 MG tablet Take 3 tablets (1,500 mg total) by mouth 2 (two) times daily after a meal. Take for 14 days, then hold for 7 days. Repeat every 21  days. (Patient not taking: Reported on 04/21/2020) 84 tablet 2    Allergies as of 05/27/2020  . (No Known Allergies)    Family History  Problem Relation Age of Onset  . Heart disease Mother   . Lung cancer Father   . Colon cancer Neg Hx   . Esophageal cancer Neg Hx   . Stomach cancer Neg Hx   . Rectal cancer Neg Hx     Social History   Socioeconomic History  . Marital status: Single    Spouse name: Not on file  . Number of children: 0  . Years of education: 73  . Highest education level: Not on file  Occupational History    Comment: retired  Tobacco Use  . Smoking status: Former Smoker    Packs/day: 1.00    Years: 7.00    Pack years: 7.00    Quit date: 12/21/1976    Years since quitting: 43.4  . Smokeless tobacco: Never Used  Vaping Use  . Vaping Use: Never used  Substance and Sexual Activity  . Alcohol use: Yes    Comment: occ  . Drug use: No  . Sexual activity: Not on file  Other Topics Concern  . Not on file  Social History Narrative  Lives alone   Caffeine- coffee, tea, sodas 1/2 gallon total daily   Social Determinants of Health   Financial Resource Strain: Not on file  Food Insecurity: Not on file  Transportation Needs: Not on file  Physical Activity: Not on file  Stress: Not on file  Social Connections: Not on file  Intimate Partner Violence: Not on file    Review of Systems: All systems reviewed and negative except where noted in HPI.  OBJECTIVE:    Physical Exam: Vital signs in last 24 hours: Temp:  [98.3 F (36.8 C)-98.4 F (36.9 C)] 98.3 F (36.8 C) (01/26 2259) Pulse Rate:  [98-107] 106 (01/27 0630) Resp:  [13-20] 15 (01/27 0630) BP: (121-141)/(54-88) 137/88 (01/27 0630) SpO2:  [96 %-100 %] 99 % (01/27 0630) Weight:  [113.4 kg] 113.4 kg (01/26 2259)   General:   Alert  Obese male in NAD Psych:  Pleasant, cooperative. Normal mood and affect. Eyes:  Pupils equal, sclera clear, no icterus.   Conjunctiva pink. Ears:  Normal  auditory acuity. Nose:  No deformity, discharge,  or lesions. Neck:  Supple; no masses Lungs:  Clear throughout to auscultation.   No wheezes, crackles, or rhonchi.  Heart:  Regular rate and rhythm; 1-2+ bilateral pedal edema Abdomen:  Soft, obese, mild RUQ tenderness.  BS active Rectal:  Deferred  Msk:  Symmetrical without gross deformities. . Neurologic:  Alert and  oriented x4;  grossly normal neurologically. Skin:  Intact without significant lesions or rashes.  Filed Weights   05/27/20 1847 05/27/20 2259  Weight: 113.4 kg 113.4 kg     Scheduled inpatient medications . amitriptyline  50 mg Oral QHS  . atorvastatin  10 mg Oral QHS  . famotidine  20 mg Oral BID  . lisinopril  20 mg Oral Daily  . mycophenolate  1,000 mg Oral BID  . pantoprazole (PROTONIX) IV  40 mg Intravenous QHS  . pyridostigmine  30-60 mg Oral TID      Intake/Output from previous day: No intake/output data recorded. Intake/Output this shift: No intake/output data recorded.   Lab Results: Recent Labs    05/28/20 0235  WBC 7.8  HGB 10.9*  HCT 33.0*  PLT 214   BMET Recent Labs    05/28/20 0235  NA 130*  K 4.2  CL 97*  CO2 25  GLUCOSE 118*  BUN 16  CREATININE 0.66  CALCIUM 10.2   LFT Recent Labs    05/28/20 0235  PROT 5.7*  ALBUMIN 3.2*  AST 224*  ALT 219*  ALKPHOS 493*  BILITOT 8.8*   PT/INR No results for input(s): LABPROT, INR in the last 72 hours. Hepatitis Panel No results for input(s): HEPBSAG, HCVAB, HEPAIGM, HEPBIGM in the last 72 hours.   . CBC Latest Ref Rng & Units 05/28/2020 05/12/2020 04/21/2020  WBC 4.0 - 10.5 K/uL 7.8 11.9(H) 6.0  Hemoglobin 13.0 - 17.0 g/dL 10.9(L) 13.0 12.8(L)  Hematocrit 39.0 - 52.0 % 33.0(L) 37.4(L) 36.9(L)  Platelets 150 - 400 K/uL 214 275 240    . CMP Latest Ref Rng & Units 05/28/2020 05/12/2020 04/21/2020  Glucose 70 - 99 mg/dL 118(H) 175(H) 137(H)  BUN 8 - 23 mg/dL _0 Creatinine 0.61 - 1.24 mg/dL 0.66 0.76 0.84  Sodium 135  - 145 mmol/L 130(L) 133(L) 132(L)  Potassium 3.5 - 5.1 mmol/L 4.2 4.7 4.7  Chloride 98 - 111 mmol/L 97(L) 105 105  CO2 22 - 32 mmol/L 25 23 20(L)  Calcium 8.9 - 10.3 mg/dL 10.2  11.4(H) 11.1(H)  Total Protein 6.5 - 8.1 g/dL 5.7(L) 6.5 6.5  Total Bilirubin 0.3 - 1.2 mg/dL 8.8(H) 0.7 1.1  Alkaline Phos 38 - 126 U/L 493(H) 115 99  AST 15 - 41 U/L 224(H) 24 27  ALT 0 - 44 U/L 219(H) 22 23   Studies/Results: MR 3D Recon At Scanner  Result Date: 05/27/2020 CLINICAL DATA:  Jaundice, right upper quadrant abdominal pain, recent cholecystectomy EXAM: MRI ABDOMEN WITHOUT AND WITH CONTRAST (INCLUDING MRCP) TECHNIQUE: Multiplanar multisequence MR imaging of the abdomen was performed both before and after the administration of intravenous contrast. Heavily T2-weighted images of the biliary and pancreatic ducts were obtained, and three-dimensional MRCP images were rendered by post processing. CONTRAST:  8m GADAVIST GADOBUTROL 1 MMOL/ML IV SOLN COMPARISON:  Intraoperative cholangiogram 04/23/2020, CT abdomen pelvis 04/02/2020 FINDINGS: Lower chest: There is pathologic retrocrural adenopathy with the index lymph node measuring 2.0 x 3.8 cm on image # 20/41. Multiple periaortic lymph nodes or potentially left lower lobe pulmonary nodules are also identified in this region Hepatobiliary: Normal hepatic parenchymal signal intensity. There is marked intra and extrahepatic biliary ductal dilation to the level of the pancreatic head. Within the pancreatic head, there is a malignant appearing stricture involving the distal 3 cm of the a common duct with malignant shouldering identified. Tiny cyst noted within segment 4 of the liver. No enhancing liver mass identified. There is pathologic periportal adenopathy at the liver hilum with the index lymph node measuring 1.5 x 2.7 cm at image # 13/21. Status post cholecystectomy. Pancreas: There has developed a a solid mass within the head of the pancreas measuring 3.6 x 4.1 cm at  axial image 24/3 which abuts and narrows the main portal vein,. The common hepatic artery appears distinct from the mass as does the superior mesenteric artery. Separately, the main pancreatic duct within the tail the pancreas appears mildly dilated and there is atrophy of the tail the pancreas. The body of the pancreas appears mildly thickened,, demonstrating decreased signal intensity on T1 and T2 weighted imaging and relative hypoenhancement with increasing enhance mint noted on delayed phase imaging in keeping with focal fibrosis within the mid body of the pancreas, likely sequela of remote pancreatitis. Spleen:  Unremarkable Adrenals/Urinary Tract: The adrenal glands are unremarkable. Simple cortical cyst noted within the lower pole of the right kidney. The kidneys are otherwise unremarkable. Stomach/Bowel: The stomach and visualized small and large bowel are unremarkable. Vascular/Lymphatic: As noted above, the main portal vein is narrowed by the abutting pancreatic mass, but is still patent. The abdominal vasculature is otherwise unremarkable. There is extensive pathologic adenopathy within the gastrohepatic ligament and retroperitoneum within the periaortic, aortocaval, and retrocaval lymph node groups. This matted conglomerate of pathologic adenopathy measures 4.3 x 9.7 cm in greatest dimension on image 25/3. Other:  None Musculoskeletal: Normal bone marrow signal intensity IMPRESSION: Interval development of a solid mass within the head of the pancreas in most in keeping with a primary pancreatic malignancy with resultant malignant stricture of the distal common duct and resultant marked intra and extrahepatic biliary ductal dilation. Extensive pathologic adenopathy within the retroperitoneum involving the retrocrural, retrocaval, aortocaval and left periaortic lymph node groups. Pathologic adenopathy also noted within the gastrohepatic ligament and periportal lymph node groups. Possible metastatic  pulmonary nodules within the left lower lobe. Dedicated CT imaging of the chest, abdomen, and pelvis is recommended for further evaluation. Retroperitoneal adenopathy should provide a ready target for CT-guided biopsy for definitive diagnosis. Abutment of the main  portal vein with secondary narrowing related to the pancreatic mass. The portal vein, however, is still patent. Area of focal fibrosis within the mid body of the pancreas, likely the sequela of remote pancreatitis. Status post cholecystectomy. Electronically Signed   By: Fidela Salisbury MD   On: 05/27/2020 23:12   MR ABDOMEN MRCP W WO CONTAST  Result Date: 05/27/2020 CLINICAL DATA:  Jaundice, right upper quadrant abdominal pain, recent cholecystectomy EXAM: MRI ABDOMEN WITHOUT AND WITH CONTRAST (INCLUDING MRCP) TECHNIQUE: Multiplanar multisequence MR imaging of the abdomen was performed both before and after the administration of intravenous contrast. Heavily T2-weighted images of the biliary and pancreatic ducts were obtained, and three-dimensional MRCP images were rendered by post processing. CONTRAST:  26m GADAVIST GADOBUTROL 1 MMOL/ML IV SOLN COMPARISON:  Intraoperative cholangiogram 04/23/2020, CT abdomen pelvis 04/02/2020 FINDINGS: Lower chest: There is pathologic retrocrural adenopathy with the index lymph node measuring 2.0 x 3.8 cm on image # 20/41. Multiple periaortic lymph nodes or potentially left lower lobe pulmonary nodules are also identified in this region Hepatobiliary: Normal hepatic parenchymal signal intensity. There is marked intra and extrahepatic biliary ductal dilation to the level of the pancreatic head. Within the pancreatic head, there is a malignant appearing stricture involving the distal 3 cm of the a common duct with malignant shouldering identified. Tiny cyst noted within segment 4 of the liver. No enhancing liver mass identified. There is pathologic periportal adenopathy at the liver hilum with the index lymph node  measuring 1.5 x 2.7 cm at image # 13/21. Status post cholecystectomy. Pancreas: There has developed a a solid mass within the head of the pancreas measuring 3.6 x 4.1 cm at axial image 24/3 which abuts and narrows the main portal vein,. The common hepatic artery appears distinct from the mass as does the superior mesenteric artery. Separately, the main pancreatic duct within the tail the pancreas appears mildly dilated and there is atrophy of the tail the pancreas. The body of the pancreas appears mildly thickened,, demonstrating decreased signal intensity on T1 and T2 weighted imaging and relative hypoenhancement with increasing enhance mint noted on delayed phase imaging in keeping with focal fibrosis within the mid body of the pancreas, likely sequela of remote pancreatitis. Spleen:  Unremarkable Adrenals/Urinary Tract: The adrenal glands are unremarkable. Simple cortical cyst noted within the lower pole of the right kidney. The kidneys are otherwise unremarkable. Stomach/Bowel: The stomach and visualized small and large bowel are unremarkable. Vascular/Lymphatic: As noted above, the main portal vein is narrowed by the abutting pancreatic mass, but is still patent. The abdominal vasculature is otherwise unremarkable. There is extensive pathologic adenopathy within the gastrohepatic ligament and retroperitoneum within the periaortic, aortocaval, and retrocaval lymph node groups. This matted conglomerate of pathologic adenopathy measures 4.3 x 9.7 cm in greatest dimension on image 25/3. Other:  None Musculoskeletal: Normal bone marrow signal intensity IMPRESSION: Interval development of a solid mass within the head of the pancreas in most in keeping with a primary pancreatic malignancy with resultant malignant stricture of the distal common duct and resultant marked intra and extrahepatic biliary ductal dilation. Extensive pathologic adenopathy within the retroperitoneum involving the retrocrural, retrocaval,  aortocaval and left periaortic lymph node groups. Pathologic adenopathy also noted within the gastrohepatic ligament and periportal lymph node groups. Possible metastatic pulmonary nodules within the left lower lobe. Dedicated CT imaging of the chest, abdomen, and pelvis is recommended for further evaluation. Retroperitoneal adenopathy should provide a ready target for CT-guided biopsy for definitive diagnosis. Abutment of the  main portal vein with secondary narrowing related to the pancreatic mass. The portal vein, however, is still patent. Area of focal fibrosis within the mid body of the pancreas, likely the sequela of remote pancreatitis. Status post cholecystectomy. Electronically Signed   By: Fidela Salisbury MD   On: 05/27/2020 23:12    Active Problems:   Hyperbilirubinemia    Tye [name], NP-C @  05/28/2020, 9:37 AM

## 2020-05-29 ENCOUNTER — Inpatient Hospital Stay (HOSPITAL_COMMUNITY): Payer: Medicare Other | Admitting: Anesthesiology

## 2020-05-29 ENCOUNTER — Inpatient Hospital Stay (HOSPITAL_COMMUNITY): Payer: Medicare Other

## 2020-05-29 ENCOUNTER — Encounter (HOSPITAL_COMMUNITY)
Admission: EM | Disposition: A | Discharge status: Home / Self Care | Payer: Self-pay | Source: Home / Self Care | Attending: Surgery

## 2020-05-29 ENCOUNTER — Encounter (HOSPITAL_COMMUNITY): Payer: Self-pay | Admitting: Surgery

## 2020-05-29 DIAGNOSIS — R17 Unspecified jaundice: Secondary | ICD-10-CM

## 2020-05-29 DIAGNOSIS — K831 Obstruction of bile duct: Secondary | ICD-10-CM

## 2020-05-29 DIAGNOSIS — R109 Unspecified abdominal pain: Secondary | ICD-10-CM

## 2020-05-29 HISTORY — PX: ERCP: SHX5425

## 2020-05-29 HISTORY — PX: BILIARY BRUSHING: SHX6843

## 2020-05-29 HISTORY — PX: BILIARY STENT PLACEMENT: SHX5538

## 2020-05-29 HISTORY — PX: SPHINCTEROTOMY: SHX5279

## 2020-05-29 LAB — COMPREHENSIVE METABOLIC PANEL
ALT: 191 U/L — ABNORMAL HIGH (ref 0–44)
AST: 242 U/L — ABNORMAL HIGH (ref 15–41)
Albumin: 2.9 g/dL — ABNORMAL LOW (ref 3.5–5.0)
Alkaline Phosphatase: 478 U/L — ABNORMAL HIGH (ref 38–126)
Anion gap: 12 (ref 5–15)
BUN: 12 mg/dL (ref 8–23)
CO2: 15 mmol/L — ABNORMAL LOW (ref 22–32)
Calcium: 9.9 mg/dL (ref 8.9–10.3)
Chloride: 102 mmol/L (ref 98–111)
Creatinine, Ser: <0.3 mg/dL — ABNORMAL LOW (ref 0.61–1.24)
Glucose, Bld: 121 mg/dL — ABNORMAL HIGH (ref 70–99)
Potassium: 5.9 mmol/L — ABNORMAL HIGH (ref 3.5–5.1)
Sodium: 129 mmol/L — ABNORMAL LOW (ref 135–145)
Total Bilirubin: 8.6 mg/dL — ABNORMAL HIGH (ref 0.3–1.2)
Total Protein: 4.8 g/dL — ABNORMAL LOW (ref 6.5–8.1)

## 2020-05-29 SURGERY — ERCP, WITH INTERVENTION IF INDICATED
Anesthesia: General

## 2020-05-29 MED ORDER — PROPOFOL 10 MG/ML IV BOLUS
INTRAVENOUS | Status: AC
  Filled 2020-05-29: qty 20

## 2020-05-29 MED ORDER — SUGAMMADEX SODIUM 200 MG/2ML IV SOLN
INTRAVENOUS | Status: DC | PRN
  Administered 2020-05-29: 300 mg via INTRAVENOUS

## 2020-05-29 MED ORDER — SUCCINYLCHOLINE CHLORIDE 200 MG/10ML IV SOSY
PREFILLED_SYRINGE | INTRAVENOUS | Status: DC | PRN
  Administered 2020-05-29: 160 mg via INTRAVENOUS

## 2020-05-29 MED ORDER — PROPOFOL 10 MG/ML IV BOLUS
INTRAVENOUS | Status: DC | PRN
  Administered 2020-05-29: 150 mg via INTRAVENOUS

## 2020-05-29 MED ORDER — SODIUM CHLORIDE 0.9 % IV SOLN
INTRAVENOUS | Status: DC | PRN
  Administered 2020-05-29: 30 mL

## 2020-05-29 MED ORDER — LACTATED RINGERS IV SOLN: INTRAVENOUS | Status: DC | PRN

## 2020-05-29 MED ORDER — SODIUM CHLORIDE 0.9 % IV SOLN
1.5000 g | INTRAVENOUS | Status: DC
  Filled 2020-05-29: qty 4

## 2020-05-29 MED ORDER — INDOMETHACIN 50 MG RE SUPP
RECTAL | Status: DC | PRN
  Administered 2020-05-29: 100 mg via RECTAL

## 2020-05-29 MED ORDER — GLUCAGON HCL RDNA (DIAGNOSTIC) 1 MG IJ SOLR
INTRAMUSCULAR | Status: AC
  Filled 2020-05-29: qty 1

## 2020-05-29 MED ORDER — ROCURONIUM BROMIDE 100 MG/10ML IV SOLN
INTRAVENOUS | Status: DC | PRN
  Administered 2020-05-29: 30 mg via INTRAVENOUS

## 2020-05-29 MED ORDER — ONDANSETRON HCL 4 MG/2ML IJ SOLN
INTRAMUSCULAR | Status: DC | PRN
  Administered 2020-05-29: 4 mg via INTRAVENOUS

## 2020-05-29 MED ORDER — HYDROCODONE-ACETAMINOPHEN 7.5-325 MG PO TABS: 1.0000 | ORAL_TABLET | Freq: Four times a day (QID) | ORAL | 0 refills | Status: AC | PRN

## 2020-05-29 MED ORDER — FENTANYL CITRATE (PF) 100 MCG/2ML IJ SOLN
INTRAMUSCULAR | Status: AC
  Filled 2020-05-29: qty 2

## 2020-05-29 MED ORDER — LIDOCAINE HCL (CARDIAC) PF 100 MG/5ML IV SOSY
PREFILLED_SYRINGE | INTRAVENOUS | Status: DC | PRN
  Administered 2020-05-29: 80 mg via INTRAVENOUS

## 2020-05-29 MED ORDER — FENTANYL CITRATE (PF) 100 MCG/2ML IJ SOLN
INTRAMUSCULAR | Status: DC | PRN
  Administered 2020-05-29: 50 ug via INTRAVENOUS

## 2020-05-29 MED ORDER — LACTATED RINGERS IV SOLN: INTRAVENOUS | Status: DC

## 2020-05-29 MED ORDER — INDOMETHACIN 50 MG RE SUPP: 100.0000 mg | Freq: Once | RECTAL | Status: AC

## 2020-05-29 MED ORDER — INDOMETHACIN 50 MG RE SUPP
RECTAL | Status: AC
  Filled 2020-05-29: qty 2

## 2020-05-29 NOTE — Anesthesia Postprocedure Evaluation (Signed)
Anesthesia Post Note  Patient: Philip [name]  Procedure(s) Performed: ENDOSCOPIC RETROGRADE CHOLANGIOPANCREATOGRAPHY (ERCP) (N/A ) SPHINCTEROTOMY BILIARY STENT PLACEMENT (N/A )     Patient location during evaluation: PACU Anesthesia Type: General Level of consciousness: awake and alert Pain management: pain level controlled Vital Signs Assessment: post-procedure vital signs reviewed and stable Respiratory status: spontaneous breathing, nonlabored ventilation and respiratory function stable Cardiovascular status: blood pressure returned to baseline and stable Postop Assessment: no apparent nausea or vomiting Anesthetic complications: no   No complications documented.  Last Vitals:  Vitals:   05/29/20 1100 05/29/20 1110  BP: 132/82 138/71  Pulse: 98 93  Resp: (!) 21 18  Temp:    SpO2: 95% 95%    Last Pain:  Vitals:   05/29/20 1201  TempSrc:   PainSc: [employer]

## 2020-05-29 NOTE — Progress Notes (Addendum)
HEMATOLOGY-ONCOLOGY PROGRESS NOTE  SUBJECTIVE: [name] is status post lap chole about 1 month ago.  Admitted due to abdominal pain with elevated bilirubin.  MRCP showed interval development of a solid mass in the head of the pancreas.  Also noted to have extensive pathologic adenopathy and possible pulmonary nodules.  ERCP was performed earlier today and brushings were obtained for cytology from the bile duct stricture.  One stent was placed.  The patient had just returned to his room for endoscopy at the time of visit.  He remains somewhat groggy.  He is denying abdominal pain, nausea, vomiting.  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   01/22/2018 Procedure   COLONOSCOPY IMPRESSION - Redundant colon. - The examination was otherwise normal on direct and retroflexion views. - No specimens collected.  UPPER ENDOSCOPY IMPRESSION: - Esophageal mucosal changes secondary to established short-segment Barrett's disease. - Mucosal nodule found in the esophagus. Biopsied. - Erythematous mucosa in the prepyloric region of the stomach. - Submucosal nodule found in the duodenum.    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 No esophageal primary identified. No findings of metastatic disease in the chest, abdomen, or pelvis. 2.  Aortic Atherosclerosis (ICD10-I70.0).   02/12/2018 Procedure   EUS Impression: - A mass was found in the distal esophagus  into the gastroesophageal junction and extending to proximal cardia. A tissue diagnosis was obtained prior to this exam consistent with adenocarcinoma. This was staged at T2N0Mx, because of a loss of interface of the muscularis propria as the distal GE Junction becomes the cardia and the mass moves into this region, but there are many more regions and images that suggest a T1 lesion into the submucosa.   05/29/2018 Imaging   NM PET Image Initial (PI) Skull Base To Thigh  IMPRESSION: 1. Hypermetabolic focus at the GE junction consistent with know adenocarcinoma. 2. No evidence of metastatic adenopathy in the mediastinum or upper abdomen. 3. No evidence of liver metastasis.  No pulmonary metastasis.   06/06/2018 -  Chemotherapy   Weeklycarboplatin and Taxol, with concurrent radiationstarting on 06/06/2018-07/10/18   06/06/2018 - 07/16/2018 Radiation Therapy   Concurrent chemoRT with Dr. Harvel Ricks on 06/06/2018-07/16/18   11/06/2018 PET scan   PET 11/06/18  IMPRESSION: 1. No residual hypermetabolic activity at the gastroesophageal junction. 2. No evidence of metastatic disease.   12/22/2018 Procedure   Upper Endoscopy by Dr. Loletha Carrow 12/22/18  IMPRESSION - Esophageal mucosal changes secondary to established short-segment Barrett's disease. - Erythematous, friable (with contact bleeding), nodular mucosa in the esophagus. Biopsied. May be inflammation from radiation. - Erythematous and petechial mucosa in the gastric fundus and gastric body. Appears likely related to radiation. - Multiple fundic gland polyps. - Eroded and nodular mucosa in the antrum. Biopsied. - Submucosal nodule found in the duodenum. Stable in size from prior exams.    12/22/2018 Pathology Results   Diagnosis 12/22/18 1. Surgical [P], random gastric BX - CHRONIC INACTIVE GASTRITIS. - THERE IS NO EVIDENCE OF HELICOBACTER PYLORI, DYSPLASIA, OR MALIGNANCY. -  SEE COMMENT. 2. Surgical [P], esophagus, GE junction -  GASTROESOPHAGEAL JUNCTION MUCOSA WITH MILD INFLAMMATION CONSISTENT WITH GASTROESOPHAGEAL REFLUX. - THERE IS NO EVIDENCE OF GOBLET CELL METAPLASIA, DYSPLASIA OR MALIGNANCY. - SEE COMMENT.   07/08/2019 Procedure   Upper Endoscopy by Dr. Loletha Carrow 07/08/19  IMPRESSION - Nodule found in the esophagus. Biopsied to rule out recurrent malignancy vs radiation effect. - Food (residue) in the stomach. - Retained food in the duodenum.   07/08/2019 Pathology Results   Diagnosis Surgical [P], distal esophagus - POORLY DIFFERENTIATED ADENOCARCINOMA WITH SIGNET RING FEATURES   07/25/2019 PET scan   IMPRESSION: 1. Signs of disease recurrence at the gastroesophageal junction with local nodal disease as described.   08/14/2019 -  Chemotherapy   CAPOX q3weeks with oral Xeloda 2000mg  BID 2 weeks on/1 week off starting on 08/14/19, along with last cycle, but he did not feel any difference.  Reduced to 2000 mg in the morning and 1500 mg in the evening from cycle 8.                    --Now on  maintenance Xeloda 1500 mg twice daily, 2 weeks on and 1 week off starting 02/22/20   11/05/2019 PET scan   IMPRESSION: 1. Interval decrease in metabolic activity at the gastroesophageal junction. 2. Interval decrease in size and metabolic activity of gastrohepatic ligament metastatic lymph node. 3. No evidence of new or progressive disease in the chest, abdomen, or pelvis.      REVIEW OF SYSTEMS:   Constitutional: Denies fevers, chills Eyes: Denies blurriness of vision Ears, nose, mouth, throat, and face: Denies mucositis or sore throat Respiratory: Denies cough, dyspnea or wheezes Cardiovascular: Denies palpitation, chest discomfort Gastrointestinal:  Denies nausea, heartburn or change in bowel habits Skin: Denies abnormal skin rashes Lymphatics: Denies new lymphadenopathy or easy bruising Neurological:Denies numbness, tingling or new weaknesses Behavioral/Psych: Mood is stable, no new changes  Extremities: No  lower extremity edema All other systems were reviewed with the patient and are negative.  I have reviewed the past medical history, past surgical history, social history and family history with the patient and they are unchanged from previous note.   PHYSICAL EXAMINATION: ECOG PERFORMANCE STATUS: 1 - Symptomatic but completely ambulatory  Vitals:   05/29/20 0638 05/29/20 0857  BP: 104/71 (!) 96/50  Pulse: (!) 104 (!) 102  Resp: 18 11  Temp: 97.8 F (36.6 C) 98.3 F (36.8 C)  SpO2: 100% 97%   Filed Weights   05/27/20 1847 05/27/20 2259 05/29/20 0857  Weight: 113.4 kg 113.4 kg 113.4 kg    Intake/Output from previous day: 01/27 0701 - 01/28 0700 In: 1780.6 [I.V.:1780.6] Out: 0   GENERAL:alert, no distress and comfortable SKIN: skin color, texture, turgor are normal, no rashes or significant lesions EYES: Scleral icterus present OROPHARYNX:no exudate, no erythema and lips, buccal mucosa, and tongue normal  LUNGS: clear to auscultation and percussion with normal breathing effort HEART: regular rate & rhythm and no murmurs and no lower extremity edema ABDOMEN: Positive bowel sounds, soft, nontender Musculoskeletal:no cyanosis of digits and no clubbing  NEURO: alert & oriented x 3 with fluent speech, no focal motor/sensory deficits  LABORATORY DATA:  I have reviewed the data as listed CMP Latest Ref Rng & Units 05/29/2020 05/28/2020 05/12/2020  Glucose 70 - 99 mg/dL 121(H) 118(H) 175(H)  BUN 8 - 23 mg/dL 12 16 15   Creatinine 0.61 - 1.24 mg/dL <0.30(L) 0.66 0.76  Sodium 135 - 145 mmol/L 129(L) 130(L)  133(L)  Potassium 3.5 - 5.1 mmol/L 5.9(H) 4.2 4.7  Chloride 98 - 111 mmol/L 102 97(L) 105  CO2 22 - 32 mmol/L 15(L) 25 23  Calcium 8.9 - 10.3 mg/dL 9.9 10.2 11.4(H)  Total Protein 6.5 - 8.1 g/dL 4.8(L) 5.7(L) 6.5  Total Bilirubin 0.3 - 1.2 mg/dL 8.6(H) 8.8(H) 0.7  Alkaline Phos 38 - 126 U/L 478(H) 493(H) 115  AST 15 - 41 U/L 242(H) 224(H) 24  ALT 0 - 44 U/L 191(H) 219(H) 22     Lab Results  Component Value Date   WBC 7.8 05/28/2020   HGB 10.9 (L) 05/28/2020   HCT 33.0 (L) 05/28/2020   MCV 102.2 (H) 05/28/2020   PLT 214 05/28/2020   NEUTROABS 10.7 (H) 05/12/2020    MR 3D Recon At Scanner  Result Date: 05/27/2020 CLINICAL DATA:  Jaundice, right upper quadrant abdominal pain, recent cholecystectomy EXAM: MRI ABDOMEN WITHOUT AND WITH CONTRAST (INCLUDING MRCP) TECHNIQUE: Multiplanar multisequence MR imaging of the abdomen was performed both before and after the administration of intravenous contrast. Heavily T2-weighted images of the biliary and pancreatic ducts were obtained, and three-dimensional MRCP images were rendered by post processing. CONTRAST:  48mL GADAVIST GADOBUTROL 1 MMOL/ML IV SOLN COMPARISON:  Intraoperative cholangiogram 04/23/2020, CT abdomen pelvis 04/02/2020 FINDINGS: Lower chest: There is pathologic retrocrural adenopathy with the index lymph node measuring 2.0 x 3.8 cm on image # 20/41. Multiple periaortic lymph nodes or potentially left lower lobe pulmonary nodules are also identified in this region Hepatobiliary: Normal hepatic parenchymal signal intensity. There is marked intra and extrahepatic biliary ductal dilation to the level of the pancreatic head. Within the pancreatic head, there is a malignant appearing stricture involving the distal 3 cm of the a common duct with malignant shouldering identified. Tiny cyst noted within segment 4 of the liver. No enhancing liver mass identified. There is pathologic periportal adenopathy at the liver hilum with the index lymph node measuring 1.5 x 2.7 cm at image # 13/21. Status post cholecystectomy. Pancreas: There has developed a a solid mass within the head of the pancreas measuring 3.6 x 4.1 cm at axial image 24/3 which abuts and narrows the main portal vein,. The common hepatic artery appears distinct from the mass as does the superior mesenteric artery. Separately, the main pancreatic duct within the tail  the pancreas appears mildly dilated and there is atrophy of the tail the pancreas. The body of the pancreas appears mildly thickened,, demonstrating decreased signal intensity on T1 and T2 weighted imaging and relative hypoenhancement with increasing enhance mint noted on delayed phase imaging in keeping with focal fibrosis within the mid body of the pancreas, likely sequela of remote pancreatitis. Spleen:  Unremarkable Adrenals/Urinary Tract: The adrenal glands are unremarkable. Simple cortical cyst noted within the lower pole of the right kidney. The kidneys are otherwise unremarkable. Stomach/Bowel: The stomach and visualized small and large bowel are unremarkable. Vascular/Lymphatic: As noted above, the main portal vein is narrowed by the abutting pancreatic mass, but is still patent. The abdominal vasculature is otherwise unremarkable. There is extensive pathologic adenopathy within the gastrohepatic ligament and retroperitoneum within the periaortic, aortocaval, and retrocaval lymph node groups. This matted conglomerate of pathologic adenopathy measures 4.3 x 9.7 cm in greatest dimension on image 25/3. Other:  None Musculoskeletal: Normal bone marrow signal intensity IMPRESSION: Interval development of a solid mass within the head of the pancreas in most in keeping with a primary pancreatic malignancy with resultant malignant stricture of the distal common duct and  resultant marked intra and extrahepatic biliary ductal dilation. Extensive pathologic adenopathy within the retroperitoneum involving the retrocrural, retrocaval, aortocaval and left periaortic lymph node groups. Pathologic adenopathy also noted within the gastrohepatic ligament and periportal lymph node groups. Possible metastatic pulmonary nodules within the left lower lobe. Dedicated CT imaging of the chest, abdomen, and pelvis is recommended for further evaluation. Retroperitoneal adenopathy should provide a ready target for CT-guided biopsy for  definitive diagnosis. 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 The portal vein, however, is still patent. Area of focal fibrosis within the mid body of the pancreas, likely the sequela of remote pancreatitis. Status post cholecystectomy. Electronically Signed   By: Fidela Salisbury MD   On: 05/27/2020 23:12   MR ABDOMEN MRCP W WO CONTAST  Result Date: 05/27/2020 CLINICAL DATA:  Jaundice, right upper quadrant abdominal pain, recent cholecystectomy EXAM: MRI ABDOMEN WITHOUT AND WITH CONTRAST (INCLUDING MRCP) TECHNIQUE: Multiplanar multisequence MR imaging of the abdomen was performed both before and after the administration of intravenous contrast. Heavily T2-weighted images of the biliary and pancreatic ducts were obtained, and three-dimensional MRCP images were rendered by post processing. CONTRAST:  27mL GADAVIST GADOBUTROL 1 MMOL/ML IV SOLN COMPARISON:  Intraoperative cholangiogram 04/23/2020, CT abdomen pelvis 04/02/2020 FINDINGS: Lower chest: There is pathologic retrocrural adenopathy with the index lymph node measuring 2.0 x 3.8 cm on image # 20/41. Multiple periaortic lymph nodes or potentially left lower lobe pulmonary nodules are also identified in this region Hepatobiliary: Normal hepatic parenchymal signal intensity. There is marked intra and extrahepatic biliary ductal dilation to the level of the pancreatic head. Within the pancreatic head, there is a malignant appearing stricture involving the distal 3 cm of the a common duct with malignant shouldering identified. Tiny cyst noted within segment 4 of the liver. No enhancing liver mass identified. There is pathologic periportal adenopathy at the liver hilum with the index lymph node measuring 1.5 x 2.7 cm at image # 13/21. Status post cholecystectomy. Pancreas: There has developed a a solid mass within the head of the pancreas measuring 3.6 x 4.1 cm at axial image 24/3 which abuts and narrows the main  portal vein,. The common hepatic artery appears distinct from the mass as does the superior mesenteric artery. Separately, the main pancreatic duct within the tail the pancreas appears mildly dilated and there is atrophy of the tail the pancreas. The body of the pancreas appears mildly thickened,, demonstrating decreased signal intensity on T1 and T2 weighted imaging and relative hypoenhancement with increasing enhance mint noted on delayed phase imaging in keeping with focal fibrosis within the mid body of the pancreas, likely sequela of remote pancreatitis. Spleen:  Unremarkable Adrenals/Urinary Tract: The adrenal glands are unremarkable. Simple cortical cyst noted within the lower pole of the right kidney. The kidneys are otherwise unremarkable. Stomach/Bowel: The stomach and visualized small and large bowel are unremarkable. Vascular/Lymphatic: As noted above, the main portal vein is narrowed by the abutting pancreatic mass, but is still patent. The abdominal vasculature is otherwise unremarkable. There is extensive pathologic adenopathy within the gastrohepatic ligament and retroperitoneum within the periaortic, aortocaval, and retrocaval lymph node groups. This matted conglomerate of pathologic adenopathy measures 4.3 x 9.7 cm in greatest dimension on image 25/3. Other:  None Musculoskeletal: Normal bone marrow signal intensity IMPRESSION: Interval development of a solid mass within the head of the pancreas in most in keeping with a primary pancreatic malignancy with resultant malignant stricture of the distal common duct  and resultant marked intra and extrahepatic biliary ductal dilation. Extensive pathologic adenopathy within the retroperitoneum involving the retrocrural, retrocaval, aortocaval and left periaortic lymph node groups. Pathologic adenopathy also noted within the gastrohepatic ligament and periportal lymph node groups. Possible metastatic pulmonary nodules within the left lower lobe. Dedicated  CT imaging of the chest, abdomen, and pelvis is recommended for further evaluation. Retroperitoneal adenopathy should provide a ready target for CT-guided biopsy for definitive diagnosis. 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 The portal vein, however, is still patent. Area of focal fibrosis within the mid body of the pancreas, likely the sequela of remote pancreatitis. Status post cholecystectomy. Electronically Signed   By: Fidela Salisbury MD   On: 05/27/2020 23:12    ASSESSMENT AND PLAN: 1.  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  Transaminitis with hyperbilirubinemia 3.  Hyponatremia 4.  Anemia  -Mr. Folmer is status post ERCP earlier today.  1 stent was placed.  Cytology pending.  We will follow up on cytology. -Continue to follow liver function very closely. -MRCP from this admission concerning for solid mass in the head of the pancreas as well as pathologic adenopathy and possible metastatic disease and the left lower lobe.  Further discussion regarding treatment options per Dr. Burr Medico. -Continue to hold Xeloda.   LOS: 1 day   Mikey Bussing, DNP, AGPCNP-BC, AOCNP 05/29/20   Addendum  I have seen the patient, examined him. I agree with the assessment and and plan and have edited the notes.   Pt was quite drowsy after the ERCP. I have reviewed the ERCP report and his MRI abdomen from two days ago. Pt has severe obstructive jaundice from a solid mass from head of pancreas or enlarged node from that area. Given the diffuse RP adenopathy, I think this is likely metastatic cancer from his known esophageal cancer, but primary pancreatic cancer is also a possibility. Bile duct brush cytology is pending. I will discuss with Dr. Henrene Pastor and Dr. Jenell Milliner to see if EUS is feasible to tell the origin of cancer, vs let IR to biopsy one of the RP node for further diagnosis. Please consider CT CAP w contrast in the next few  days for further evaluation.  I will f/u on Monday.  Truitt Merle  05/29/2020

## 2020-05-29 NOTE — Op Note (Signed)
[facility] Patient Name: Philip [name] Procedure Date: 05/29/2020 MRN: [id number] Attending MD: Docia Chuck. Henrene Pastor , MD Date of Birth: [birth year] CSN: [id number] Age: 75 Admit Type: Outpatient Procedure:                ERCP with sphincterotomy, bile duct brushings,                            stent placement Indications:              Malignant stricture of the common bile duct,                            Abnormal MRCP, Jaundice Providers:                Docia Chuck. Henrene Pastor, MD, Cleda Daub, RN, Tyna Jaksch                            Technician, Stephanie [place] CRNA Referring MD:              Medicines:                General Anesthesia Complications:            No immediate complications. Estimated Blood Loss:     Estimated blood loss: none. Procedure:                Pre-Anesthesia Assessment:                           - Prior to the procedure, a History and Physical                            was performed, and patient medications and                            allergies were reviewed. The patient's tolerance of                            previous anesthesia was also reviewed. The risks                            and benefits of the procedure and the sedation                            options and risks were discussed with the patient.                            All questions were answered, and informed consent                            was obtained. Prior Anticoagulants: The patient has                            taken no previous anticoagulant or antiplatelet  agents. ASA Grade Assessment: III - A patient with                            severe systemic disease. After reviewing the risks                            and benefits, the patient was deemed in                            satisfactory condition to undergo the procedure.                           After obtaining informed consent, the scope was                            passed under  direct vision. Throughout the                            procedure, the patient's blood pressure, pulse, and                            oxygen saturations were monitored continuously. The                            Olympus [id number]-[id number]) was introduced through                            the mouth, and used to inject contrast into and                            used to inject contrast into the bile duct. The                            ERCP was accomplished without difficulty. The                            patient tolerated the procedure well. Scope In: Scope Out: Findings:      A scout film of the abdomen was obtained. Surgical clips, consistent       with a previous cholecystectomy, were seen in the area of the right       upper quadrant of the abdomen. The esophagus was successfully intubated       and advanced to the gastroesophageal junction where there was some       resistance. Scarring in fleshy tissue noticed. Scope was then advanced       into the stomach. There were retained gastric contents.. The scope was       advanced to a normal major papilla in the descending duodenum. There was       definite mass-effect at the junction of the first and second portion of       the duodenum. A 0.035 inch straight standard wire was passed into the       biliary tree. The traction (standard) sphincterotome was passed over the       guidewire and the bile duct was  then deeply cannulated. Contrast was       injected. The entire biliary tree except for the gallbladder and common       bile duct contained a single segmental stenosis 30 mm in length, in the       distal portion. The common bile duct was diffusely dilated above this       region, secondary to a stricture. Biliary sphincterotomy was made with a       traction (standard) sphincterotome using ERBE electrocautery. There was       no post-sphincterotomy bleeding. Cells for cytology were obtained by       brushing in the distal  bile duct stricture. One 10 mm by 6 cm uncovered       metal stent was placed into the common bile duct. Bile flowed through       the stent. The stent was in good position. Impression:               - A single segmental biliary stricture was found in                            the common bile duct. The stricture was malignant                            appearing.                           - The common bile duct was dilated, secondary to a                            stricture.                           - A biliary sphincterotomy was performed.                           - Cells for cytology obtained from the distal                            stricture.                           - One uncovered metal stent was placed into the                            common bile duct. Moderate Sedation:      none Recommendation:           1. Standard post ERCP observation and care                           2. Trend liver tests                           3. Follow-up cytology                           4. Inpatient GI will follow.. Procedure Code(s):        --- Professional ---  51025, Endoscopic retrograde                            cholangiopancreatography (ERCP); with placement of                            endoscopic stent into biliary or pancreatic duct,                            including pre- and post-dilation and guide wire                            passage, when performed, including sphincterotomy,                            when performed, each stent Diagnosis Code(s):        --- Professional ---                           K83.1, Obstruction of bile duct                           R17, Unspecified jaundice                           R93.2, Abnormal findings on diagnostic imaging of                            liver and biliary tract CPT copyright 2019 [employer]. All rights reserved. The codes documented in this report are preliminary and upon coder  review may  be revised to meet current compliance requirements. Docia Chuck. Henrene Pastor, MD 05/29/2020 11:18:12 AM This report has been signed electronically. Number of Addenda: 0

## 2020-05-29 NOTE — Progress Notes (Signed)
Consulted to place PIV. Patient went to a procedure, primary Rn will re enter consult when patient back to floor or PIV still needed.

## 2020-05-29 NOTE — Progress Notes (Signed)
Day of Surgery  Subjective: No acute changes. Afebrile, vitals stable. Labs pending. Still having abdominal pain.   Objective: Vital signs in last 24 hours: Temp:  [97.7 F (36.5 C)-98.3 F (36.8 C)] 97.8 F (36.6 C) (01/28 0932) Pulse Rate:  [96-108] 104 (01/28 0638) Resp:  [18-19] 18 (01/28 6712) BP: (102-149)/(58-83) 104/71 (01/28 4580) SpO2:  [94 %-100 %] 100 % (01/28 9983) Last BM Date: 05/28/20  Intake/Output from previous day: 01/27 0701 - 01/28 0700 In: 1780.6 [I.V.:1780.6] Out: 0  Intake/Output this shift: No intake/output data recorded.  PE: General: resting comfortably, NAD Neuro: alert and oriented, no focal deficits HEENT: scleral icterus Resp: normal work of breathing Abdomen: soft, nondistended, nontender to palpation Extremities: warm and well-perfused   Lab Results:  Recent Labs    05/28/20 0235  WBC 7.8  HGB 10.9*  HCT 33.0*  PLT 214   BMET Recent Labs    05/28/20 0235  NA 130*  K 4.2  CL 97*  CO2 25  GLUCOSE 118*  BUN 16  CREATININE 0.66  CALCIUM 10.2   PT/INR Recent Labs    05/28/20 1217  LABPROT 15.6*  INR 1.3*   CMP     Component Value Date/Time   NA 130 (L) 05/28/2020 0235   NA 138 12/04/2017 1715   K 4.2 05/28/2020 0235   CL 97 (L) 05/28/2020 0235   CO2 25 05/28/2020 0235   GLUCOSE 118 (H) 05/28/2020 0235   BUN 16 05/28/2020 0235   BUN 19 12/04/2017 1715   CREATININE 0.66 05/28/2020 0235   CREATININE 0.76 05/12/2020 1252   CALCIUM 10.2 05/28/2020 0235   CALCIUM 10.8 (H) 06/28/2018 1347   PROT 5.7 (L) 05/28/2020 0235   PROT 6.0 12/04/2017 1715   ALBUMIN 3.2 (L) 05/28/2020 0235   ALBUMIN 4.0 12/04/2017 1715   AST 224 (H) 05/28/2020 0235   AST 24 05/12/2020 1252   ALT 219 (H) 05/28/2020 0235   ALT 22 05/12/2020 1252   ALKPHOS 493 (H) 05/28/2020 0235   BILITOT 8.8 (H) 05/28/2020 0235   BILITOT 0.7 05/12/2020 1252   GFRNONAA >60 05/28/2020 0235   GFRNONAA >60 05/12/2020 1252   GFRAA >60 01/16/2020 0951    Lipase     Component Value Date/Time   LIPASE 23 05/12/2020 1252       Studies/Results: MR 3D Recon At Scanner  Result Date: 05/27/2020 CLINICAL DATA:  Jaundice, right upper quadrant abdominal pain, recent cholecystectomy EXAM: MRI ABDOMEN WITHOUT AND WITH CONTRAST (INCLUDING MRCP) TECHNIQUE: Multiplanar multisequence MR imaging of the abdomen was performed both before and after the administration of intravenous contrast. Heavily T2-weighted images of the biliary and pancreatic ducts were obtained, and three-dimensional MRCP images were rendered by post processing. CONTRAST:  35mL GADAVIST GADOBUTROL 1 MMOL/ML IV SOLN COMPARISON:  Intraoperative cholangiogram 04/23/2020, CT abdomen pelvis 04/02/2020 FINDINGS: Lower chest: There is pathologic retrocrural adenopathy with the index lymph node measuring 2.0 x 3.8 cm on image # 20/41. Multiple periaortic lymph nodes or potentially left lower lobe pulmonary nodules are also identified in this region Hepatobiliary: Normal hepatic parenchymal signal intensity. There is marked intra and extrahepatic biliary ductal dilation to the level of the pancreatic head. Within the pancreatic head, there is a malignant appearing stricture involving the distal 3 cm of the a common duct with malignant shouldering identified. Tiny cyst noted within segment 4 of the liver. No enhancing liver mass identified. There is pathologic periportal adenopathy at the liver hilum with the index lymph  node measuring 1.5 x 2.7 cm at image # 13/21. Status post cholecystectomy. Pancreas: There has developed a a solid mass within the head of the pancreas measuring 3.6 x 4.1 cm at axial image 24/3 which abuts and narrows the main portal vein,. The common hepatic artery appears distinct from the mass as does the superior mesenteric artery. Separately, the main pancreatic duct within the tail the pancreas appears mildly dilated and there is atrophy of the tail the pancreas. The body of the  pancreas appears mildly thickened,, demonstrating decreased signal intensity on T1 and T2 weighted imaging and relative hypoenhancement with increasing enhance mint noted on delayed phase imaging in keeping with focal fibrosis within the mid body of the pancreas, likely sequela of remote pancreatitis. Spleen:  Unremarkable Adrenals/Urinary Tract: The adrenal glands are unremarkable. Simple cortical cyst noted within the lower pole of the right kidney. The kidneys are otherwise unremarkable. Stomach/Bowel: The stomach and visualized small and large bowel are unremarkable. Vascular/Lymphatic: As noted above, the main portal vein is narrowed by the abutting pancreatic mass, but is still patent. The abdominal vasculature is otherwise unremarkable. There is extensive pathologic adenopathy within the gastrohepatic ligament and retroperitoneum within the periaortic, aortocaval, and retrocaval lymph node groups. This matted conglomerate of pathologic adenopathy measures 4.3 x 9.7 cm in greatest dimension on image 25/3. Other:  None Musculoskeletal: Normal bone marrow signal intensity IMPRESSION: Interval development of a solid mass within the head of the pancreas in most in keeping with a primary pancreatic malignancy with resultant malignant stricture of the distal common duct and resultant marked intra and extrahepatic biliary ductal dilation. Extensive pathologic adenopathy within the retroperitoneum involving the retrocrural, retrocaval, aortocaval and left periaortic lymph node groups. Pathologic adenopathy also noted within the gastrohepatic ligament and periportal lymph node groups. Possible metastatic pulmonary nodules within the left lower lobe. Dedicated CT imaging of the chest, abdomen, and pelvis is recommended for further evaluation. Retroperitoneal adenopathy should provide a ready target for CT-guided biopsy for definitive diagnosis. 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 The portal vein, however, is still patent. Area of focal fibrosis within the mid body of the pancreas, likely the sequela of remote pancreatitis. Status post cholecystectomy. Electronically Signed   By: Fidela Salisbury MD   On: 05/27/2020 23:12   MR ABDOMEN MRCP W WO CONTAST  Result Date: 05/27/2020 CLINICAL DATA:  Jaundice, right upper quadrant abdominal pain, recent cholecystectomy EXAM: MRI ABDOMEN WITHOUT AND WITH CONTRAST (INCLUDING MRCP) TECHNIQUE: Multiplanar multisequence MR imaging of the abdomen was performed both before and after the administration of intravenous contrast. Heavily T2-weighted images of the biliary and pancreatic ducts were obtained, and three-dimensional MRCP images were rendered by post processing. CONTRAST:  78mL GADAVIST GADOBUTROL 1 MMOL/ML IV SOLN COMPARISON:  Intraoperative cholangiogram 04/23/2020, CT abdomen pelvis 04/02/2020 FINDINGS: Lower chest: There is pathologic retrocrural adenopathy with the index lymph node measuring 2.0 x 3.8 cm on image # 20/41. Multiple periaortic lymph nodes or potentially left lower lobe pulmonary nodules are also identified in this region Hepatobiliary: Normal hepatic parenchymal signal intensity. There is marked intra and extrahepatic biliary ductal dilation to the level of the pancreatic head. Within the pancreatic head, there is a malignant appearing stricture involving the distal 3 cm of the a common duct with malignant shouldering identified. Tiny cyst noted within segment 4 of the liver. No enhancing liver mass identified. There is pathologic periportal adenopathy at the liver hilum with the index  lymph node measuring 1.5 x 2.7 cm at image # 13/21. Status post cholecystectomy. Pancreas: There has developed a a solid mass within the head of the pancreas measuring 3.6 x 4.1 cm at axial image 24/3 which abuts and narrows the main portal vein,. The common hepatic artery appears distinct from the mass as does the superior  mesenteric artery. Separately, the main pancreatic duct within the tail the pancreas appears mildly dilated and there is atrophy of the tail the pancreas. The body of the pancreas appears mildly thickened,, demonstrating decreased signal intensity on T1 and T2 weighted imaging and relative hypoenhancement with increasing enhance mint noted on delayed phase imaging in keeping with focal fibrosis within the mid body of the pancreas, likely sequela of remote pancreatitis. Spleen:  Unremarkable Adrenals/Urinary Tract: The adrenal glands are unremarkable. Simple cortical cyst noted within the lower pole of the right kidney. The kidneys are otherwise unremarkable. Stomach/Bowel: The stomach and visualized small and large bowel are unremarkable. Vascular/Lymphatic: As noted above, the main portal vein is narrowed by the abutting pancreatic mass, but is still patent. The abdominal vasculature is otherwise unremarkable. There is extensive pathologic adenopathy within the gastrohepatic ligament and retroperitoneum within the periaortic, aortocaval, and retrocaval lymph node groups. This matted conglomerate of pathologic adenopathy measures 4.3 x 9.7 cm in greatest dimension on image 25/3. Other:  None Musculoskeletal: Normal bone marrow signal intensity IMPRESSION: Interval development of a solid mass within the head of the pancreas in most in keeping with a primary pancreatic malignancy with resultant malignant stricture of the distal common duct and resultant marked intra and extrahepatic biliary ductal dilation. Extensive pathologic adenopathy within the retroperitoneum involving the retrocrural, retrocaval, aortocaval and left periaortic lymph node groups. Pathologic adenopathy also noted within the gastrohepatic ligament and periportal lymph node groups. Possible metastatic pulmonary nodules within the left lower lobe. Dedicated CT imaging of the chest, abdomen, and pelvis is recommended for further evaluation.  Retroperitoneal adenopathy should provide a ready target for CT-guided biopsy for definitive diagnosis. 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 The portal vein, however, is still patent. Area of focal fibrosis within the mid body of the pancreas, likely the sequela of remote pancreatitis. Status post cholecystectomy. Electronically Signed   By: Fidela Salisbury MD   On: 05/27/2020 23:12    Anti-infectives: Anti-infectives (From admission, onward)   None       Assessment/Plan 75 yo male on maintenance treatment for esophageal cancer with recent acute pancreatitis, 1 month s/p lap cholecystectomy with persistent abdominal pain and new-onset obstructive jaundice. MRCP shows significant biliary ductal dilation with a distal CBD stricture and associated mass in the head of the pancreas. - GI consulted, plan for EUS/ERCP today - NPO, IV fluids, diet after procedure - Pain and nausea control - Trend LFTs - VTE: SCDs, chemical DVT ppx on hold for ERCP - Dispo: inpatient    LOS: 1 day     Michaelle Birks, MD [facility] General, Hepatobiliary and Pancreatic Surgery 05/29/20 7:31 AM

## 2020-05-29 NOTE — Plan of Care (Signed)
  Problem: Pain Managment: Goal: General experience of comfort will improve Outcome: Progressing   

## 2020-05-29 NOTE — Anesthesia Procedure Notes (Signed)
Procedure Name: Intubation Date/Time: 05/29/2020 9:48 AM Performed by: [place] Ronn Smolinsky C, CRNA Pre-anesthesia Checklist: Patient identified, Emergency Drugs available, Suction available and Patient being monitored Patient Re-evaluated:Patient Re-evaluated prior to induction Oxygen Delivery Method: Circle system utilized Preoxygenation: Pre-oxygenation with 100% oxygen Induction Type: IV induction Ventilation: Mask ventilation without difficulty Laryngoscope Size: Mac and 4 Grade View: Grade II Tube type: Oral Tube size: 7.5 mm Number of attempts: 1 Airway Equipment and Method: Stylet and Oral airway Placement Confirmation: ETT inserted through vocal cords under direct vision,  positive ETCO2 and breath sounds checked- equal and bilateral Tube secured with: Tape Dental Injury: Teeth and Oropharynx as per pre-operative assessment

## 2020-05-29 NOTE — Interval H&P Note (Signed)
History and Physical Interval Note:  05/29/2020 9:42 AM  Philip [name]  has presented today for surgery, with the diagnosis of biliary stricture.  The various methods of treatment have been discussed with the patient and family. After consideration of risks, benefits and other options for treatment, the patient has consented to  Procedure(s): ENDOSCOPIC RETROGRADE CHOLANGIOPANCREATOGRAPHY (ERCP) (N/A) as a surgical intervention.  The patient's history has been reviewed, patient examined, no change in status, stable for surgery.  I have reviewed the patient's chart and labs.  Questions were answered to the patient's satisfaction.     Scarlette Shorts

## 2020-05-29 NOTE — Anesthesia Preprocedure Evaluation (Addendum)
Anesthesia Evaluation  Patient identified by MRN, date of birth, ID band Patient awake    Reviewed: Allergy & Precautions, NPO status , Patient's Chart, lab work & pertinent test results  History of Anesthesia Complications Negative for: history of anesthetic complications  Airway Mallampati: III  TM Distance: >3 FB Neck ROM: Full    Dental  (+) Dental Advisory Given, Edentulous Upper   Pulmonary asthma , sleep apnea (noncompliant) , former smoker,    Pulmonary exam normal        Cardiovascular hypertension, Pt. on medications Normal cardiovascular exam   '19 TTE - moderate LVH. EF 60% to 65%. Grade 1 diastolic dysfunction    Neuro/Psych  Myasthenia gravis - on 60mg  pyridostigmine BID, symptoms well controlled     negative psych ROS   GI/Hepatic hiatal hernia, GERD  Controlled, Elevated LFTs    Endo/Other   Na 130 Cl 97 Obesity   Renal/GU negative Renal ROS     Musculoskeletal negative musculoskeletal ROS (+)   Abdominal   Peds  Hematology  (+) anemia ,  INR 1.3    Anesthesia Other Findings Covid test negative   Reproductive/Obstetrics                            Anesthesia Physical Anesthesia Plan  ASA: III  Anesthesia Plan: General   Post-op Pain Management:    Induction: Intravenous  PONV Risk Score and Plan: 2 and Treatment may vary due to age or medical condition, Ondansetron and Dexamethasone  Airway Management Planned: Oral ETT  Additional Equipment: None  Intra-op Plan:   Post-operative Plan: Extubation in OR  Informed Consent: I have reviewed the patients History and Physical, chart, labs and discussed the procedure including the risks, benefits and alternatives for the proposed anesthesia with the patient or authorized representative who has indicated his/her understanding and acceptance.     Dental advisory given  Plan Discussed with: CRNA and  Anesthesiologist  Anesthesia Plan Comments:        Anesthesia Quick Evaluation

## 2020-05-29 NOTE — Transfer of Care (Signed)
Immediate Anesthesia Transfer of Care Note  Patient: Philip [name]  Procedure(s) Performed: ENDOSCOPIC RETROGRADE CHOLANGIOPANCREATOGRAPHY (ERCP) (N/A )  Patient Location: PACU and Endoscopy Unit  Anesthesia Type:General  Level of Consciousness: awake, alert  and oriented  Airway & Oxygen Therapy: Patient Spontanous Breathing and Patient connected to face mask oxygen  Post-op Assessment: Report given to RN and Post -op Vital signs reviewed and stable  Post vital signs: Reviewed and stable  Last Vitals:  Vitals Value Taken Time  BP 131/68 05/29/20 1049  Temp    Pulse 100 05/29/20 1049  Resp 17 05/29/20 1049  SpO2 100 % 05/29/20 1049  Vitals shown include unvalidated device data.  Last Pain:  Vitals:   05/29/20 0857  TempSrc: Oral  PainSc: 7       Patients Stated Pain Goal: 3 (46/96/29 5284)  Complications: No complications documented.

## 2020-05-30 LAB — COMPREHENSIVE METABOLIC PANEL
ALT: 141 U/L — ABNORMAL HIGH (ref 0–44)
AST: 109 U/L — ABNORMAL HIGH (ref 15–41)
Albumin: 2.8 g/dL — ABNORMAL LOW (ref 3.5–5.0)
Alkaline Phosphatase: 417 U/L — ABNORMAL HIGH (ref 38–126)
Anion gap: 8 (ref 5–15)
BUN: 10 mg/dL (ref 8–23)
CO2: 22 mmol/L (ref 22–32)
Calcium: 9.9 mg/dL (ref 8.9–10.3)
Chloride: 104 mmol/L (ref 98–111)
Creatinine, Ser: 0.52 mg/dL — ABNORMAL LOW (ref 0.61–1.24)
GFR, Estimated: >60 mL/min (ref >60)
Glucose, Bld: 145 mg/dL — ABNORMAL HIGH (ref 70–99)
Potassium: 4 mmol/L (ref 3.5–5.1)
Sodium: 134 mmol/L — ABNORMAL LOW (ref 135–145)
Total Bilirubin: 4.4 mg/dL — ABNORMAL HIGH (ref 0.3–1.2)
Total Protein: 5.4 g/dL — ABNORMAL LOW (ref 6.5–8.1)

## 2020-05-30 LAB — CBC
HCT: 32.8 % — ABNORMAL LOW (ref 39.0–52.0)
Hemoglobin: 10.9 g/dL — ABNORMAL LOW (ref 13.0–17.0)
MCH: 34.3 pg — ABNORMAL HIGH (ref 26.0–34.0)
MCHC: 33.2 g/dL (ref 30.0–36.0)
MCV: 103.1 fL — ABNORMAL HIGH (ref 80.0–100.0)
Platelets: 212 10*3/uL (ref 150–400)
RBC: 3.18 MIL/uL — ABNORMAL LOW (ref 4.22–5.81)
RDW: 14.6 % (ref 11.5–15.5)
WBC: 5 10*3/uL (ref 4.0–10.5)
nRBC: 0 % (ref 0.0–0.2)

## 2020-05-30 NOTE — Discharge Summary (Signed)
Physician Discharge Summary  Patient ID: Philip [name] MRN: [id number] DOB/AGE: [birth year] 75 y.o.  Admit date: 05/27/2020 Discharge date: 05/30/2020  Admission Diagnoses: hyperbilirubenemia  Discharge Diagnoses:  Active Problems:   Hyperbilirubinemia   Abdominal pain   Common bile duct (CBD) stricture   Discharged Condition: good  Hospital Course: Pt admitted to the hospital for elevated bilirubin and RUQ pain.  ERCP and stent placed for panc head mass.  Patient felt much better the following am and labs were stable to downtrending.  He was felt to be in stable condition for d/c   Consults: GI  Significant Diagnostic Studies: labs: cbc, cmet  Treatments: IV hydration and procedures: ERCP with biliary stent placement  Discharge Exam: Blood pressure (!) 107/59, pulse 99, temperature 97.7 F (36.5 C), temperature source Oral, resp. rate 18, height 6' (1.829 m), weight 113.4 kg, SpO2 99 %. General appearance: alert and cooperative GI: soft, nontender  Disposition: Discharge disposition: 01-Home or Self Care        Allergies as of 05/30/2020   No Known Allergies     Medication List    TAKE these medications   albuterol 108 (90 Base) MCG/ACT inhaler Commonly known as: VENTOLIN HFA Inhale 2 puffs into the lungs every 4 (four) hours as needed for wheezing or shortness of breath.   amitriptyline 50 MG tablet Commonly known as: ELAVIL Take 50 mg by mouth at bedtime.   aspirin EC 81 MG tablet Take 81 mg by mouth daily.   atorvastatin 10 MG tablet Commonly known as: LIPITOR Take 10 mg by mouth at bedtime.   etodolac 200 MG capsule Commonly known as: LODINE Take 200 mg by mouth every 8 (eight) hours as needed for moderate pain.   famotidine 20 MG tablet Commonly known as: PEPCID TAKE ONE TABLET BY MOUTH TWICE A DAY   HYDROcodone-acetaminophen 7.5-325 MG tablet Commonly known as: NORCO Take 1 tablet by mouth 4 (four) times daily as needed for severe pain.    lisinopril 20 MG tablet Commonly known as: ZESTRIL Take 20 mg by mouth daily.   multivitamin tablet Take 1 tablet by mouth daily.   mycophenolate 500 MG tablet Commonly known as: CELLCEPT Take 2 tablets (1,000 mg total) by mouth 2 (two) times daily.   ondansetron 8 MG tablet Commonly known as: Zofran Take 1 tablet (8 mg total) by mouth 2 (two) times daily as needed for refractory nausea / vomiting. Start on day 3 after chemotherapy.   prochlorperazine 10 MG tablet Commonly known as: COMPAZINE Take 1 tablet (10 mg total) by mouth every 6 (six) hours as needed (Nausea or vomiting).   pyridostigmine 60 MG tablet Commonly known as: MESTINON Take 0.5-1 tablets (30-60 mg total) by mouth 3 (three) times daily. What changed:   how much to take  when to take this   Xeloda 500 MG tablet Generic drug: capecitabine Take 3 tablets (1,500 mg total) by mouth 2 (two) times daily after a meal. Take for 14 days, then hold for 7 days. Repeat every 21 days.       Follow-up Information    [name], MD. Go to.   Specialty: General Surgery Why: as scheduled Contact information: [place]. [address] [place] [address] [phone]               Signed: Rosario Adie 5/46/2703, 9:02 AM

## 2020-05-30 NOTE — Discharge Instructions (Addendum)
[facility] DISCHARGE INSTRUCTIONS  Activity  You may gradually resume activity as tolerated.  Do not drive while taking narcotic pain medication.   Diet: cont a liquid or soft foods diet.  Avoid heavy meats and uncooked vegetables  When to Call us:  Fever greater than 100.5  New redness, drainage, or swelling at incision site  Severe pain, nausea, or vomiting  Jaundice (yellowing of the whites of the eyes or skin)  Follow-up You have an appointment scheduled with Dr. Zenia Resides on 06/11/20 at 3:00pm. This will be at the [facility] Surgery office at [address], [place], [place], [place]. Please arrive at least 15 minutes prior to your scheduled appointment time.  For questions or concerns, please call the office at [phone].

## 2020-06-01 ENCOUNTER — Encounter (HOSPITAL_COMMUNITY): Payer: Self-pay | Admitting: Internal Medicine

## 2020-06-01 ENCOUNTER — Other Ambulatory Visit: Payer: Self-pay | Admitting: Surgery

## 2020-06-01 DIAGNOSIS — Z9049 Acquired absence of other specified parts of digestive tract: Secondary | ICD-10-CM

## 2020-06-01 LAB — CYTOLOGY - NON PAP

## 2020-06-03 ENCOUNTER — Other Ambulatory Visit: Payer: Self-pay

## 2020-06-03 NOTE — Progress Notes (Signed)
[place]   [phone] [phone]   Clinic Follow up Note   Patient Care Team: [name], MD as PCP - General (Family Medicine) Mansouraty, [name], MD as Consulting Physician (Gastroenterology) Zonia Kief, MD (Rehabilitation) [name], MD as Consulting Physician (General Surgery)  Date of Service:  06/04/2020  CHIEF COMPLAINT: f/uesophageal cancer  SUMMARY OF ONCOLOGIC HISTORY: Oncology History Overview Note  Cancer Staging Malignant neoplasm of gastroesophageal junction [facility]) Staging form: Esophagus - Adenocarcinoma, AJCC 8th Edition - Clinical stage from 02/26/2018: Stage IIB (cT2, cN0, cM0, G3) - Signed by [name], NP on 02/26/2018    Malignant neoplasm of gastroesophageal junction ([place])  01/22/2018 Initial Biopsy   Diagnosis Surgical [P], GE junction nodule BX - POORLY 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   01/22/2018 Procedure   COLONOSCOPY IMPRESSION - Redundant colon. - The examination was otherwise normal on direct and retroflexion views. - No specimens collected.  UPPER ENDOSCOPY IMPRESSION: - Esophageal mucosal changes secondary to established short-segment Barrett's disease. - Mucosal nodule found in the esophagus. Biopsied. - Erythematous mucosa in the prepyloric region of the stomach. - Submucosal nodule found in the duodenum.    02/01/2018 Initial Diagnosis   Malignant neoplasm of gastroesophageal junction ([place])   02/05/2018 Imaging   CT CAP IMPRESSION: 1. No esophageal primary identified. No findings of metastatic disease in the chest, abdomen, or pelvis. 2.  Aortic Atherosclerosis (ICD10-I70.0).   02/12/2018 Procedure   EUS Impression: - A mass was found in the distal esophagus into the gastroesophageal junction and extending to proximal cardia. A tissue diagnosis was obtained prior to this exam consistent with adenocarcinoma.  This was staged at T2N0Mx, because of a loss of interface of the muscularis propria as the distal GE Junction becomes the cardia and the mass moves into this region, but there are many more regions and images that suggest a T1 lesion into the submucosa.   05/29/2018 Imaging   NM PET Image Initial (PI) Skull Base To Thigh  IMPRESSION: 1. Hypermetabolic focus at the GE junction consistent with know adenocarcinoma. 2. No evidence of metastatic adenopathy in the mediastinum or upper abdomen. 3. No evidence of liver metastasis.  No pulmonary metastasis.   06/06/2018 -  Chemotherapy   Weeklycarboplatin and Taxol, with concurrent radiationstarting on 06/06/2018-07/10/18   06/06/2018 - 07/16/2018 Radiation Therapy   Concurrent chemoRT with Dr. Harvel Ricks on 06/06/2018-07/16/18   11/06/2018 PET scan   PET 11/06/18  IMPRESSION: 1. No residual hypermetabolic activity at the gastroesophageal junction. 2. No evidence of metastatic disease.   12/22/2018 Procedure   Upper Endoscopy by Dr. Loletha Carrow 12/22/18  IMPRESSION - Esophageal mucosal changes secondary to established short-segment Barrett's disease. - Erythematous, friable (with contact bleeding), nodular mucosa in the esophagus. Biopsied. May be inflammation from radiation. - Erythematous and petechial mucosa in the gastric fundus and gastric body. Appears likely related to radiation. - Multiple fundic gland polyps. - Eroded and nodular mucosa in the antrum. Biopsied. - Submucosal nodule found in the duodenum. Stable in size from prior exams.    12/22/2018 Pathology Results   Diagnosis 12/22/18 1. Surgical [P], random gastric BX - CHRONIC INACTIVE GASTRITIS. - THERE IS NO EVIDENCE OF HELICOBACTER PYLORI, DYSPLASIA, OR MALIGNANCY. - SEE COMMENT. 2. Surgical [P], esophagus, GE junction - GASTROESOPHAGEAL JUNCTION MUCOSA WITH MILD INFLAMMATION CONSISTENT WITH GASTROESOPHAGEAL REFLUX. - THERE IS NO EVIDENCE OF GOBLET CELL METAPLASIA, DYSPLASIA OR  MALIGNANCY. - SEE COMMENT.  07/08/2019 Procedure   Upper Endoscopy by Dr. Loletha Carrow 07/08/19  IMPRESSION - Nodule found in the esophagus. Biopsied to rule out recurrent malignancy vs radiation effect. - Food (residue) in the stomach. - Retained food in the duodenum.   07/08/2019 Pathology Results   Diagnosis Surgical [P], distal esophagus - POORLY DIFFERENTIATED ADENOCARCINOMA WITH SIGNET RING FEATURES   07/25/2019 PET scan   IMPRESSION: 1. Signs of disease recurrence at the gastroesophageal junction with local nodal disease as described.   08/14/2019 -  Chemotherapy   CAPOX q3weeks with oral Xeloda 2034m BID 2 weeks on/1 week off starting on 08/14/19, along with last cycle, but he did not feel any difference.  Reduced to 2000 mg in the morning and 1500 mg in the evening from cycle 8.                    --Now on  maintenance Xeloda 1500 mg twice daily, 2 weeks on and 1 week off starting 02/22/20   11/05/2019 PET scan   IMPRESSION: 1. Interval decrease in metabolic activity at the gastroesophageal junction. 2. Interval decrease in size and metabolic activity of gastrohepatic ligament metastatic lymph node. 3. No evidence of new or progressive disease in the chest, abdomen, or pelvis.   05/27/2020 Imaging   MRI Abdomen  IMPRESSION: Interval development of a solid mass within the head of the pancreas in most in keeping with a primary pancreatic malignancy with resultant malignant stricture of the distal common duct and resultant marked intra and extrahepatic biliary ductal dilation.   Extensive pathologic adenopathy within the retroperitoneum involving the retrocrural, retrocaval, aortocaval and left periaortic lymph node groups. Pathologic adenopathy also noted within the gastrohepatic ligament and periportal lymph node groups. Possible metastatic pulmonary nodules within the left lower lobe. Dedicated CT imaging of the chest, abdomen, and pelvis is recommended for further evaluation.  Retroperitoneal adenopathy should provide a ready target for CT-guided biopsy for definitive diagnosis.   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 The portal vein, however, is still patent.   Area of focal fibrosis within the mid body of the pancreas, likely the sequela of remote pancreatitis.   Status post cholecystectomy.   05/29/2020 Procedure   ERCP by Dr PHenrene Pastor - A single segmental biliary stricture was found in the common bile duct. The stricture was malignant appearing. - The common bile duct was dilated, secondary to a stricture. - A biliary sphincterotomy was performed. - Cells for cytology obtained from the distal stricture. - One uncovered metal stent was placed into the common bile duct.    05/29/2020 Surgery   ERCP,  SPHINCTEROTOMY    BILIARY STENT PLACEMENT N/A   BILIARY BRUSHING    By Dr PHenrene Pastor   FINAL MICROSCOPIC DIAGNOSIS:  - Atypical cells, favor reactive       CURRENT THERAPY:  Now on maintenance Xeloda 1500 mg twice daily, 2 weeks on and 1 week offstarting 02/22/20. Given recent abdominal pain/pancreatitis, Xeloda has been held since 03/30/20 due to acute cholecystitis  INTERVAL HISTORY:  Philip MARCELLAis here for a follow up. He presents to the clinic alone. He was admitted to hospital for new onset obstructive jaundice on May 27, 2020.  He underwent ERCP and stent placement by Dr. PHenrene Pastor  He was discharged home on January 29 He has been having sever low abdominal pain 8-9/10 since hospital discharge, it's constant, worse after eating.  He has been taking his  regular hydrocodone, but his pain is not controlled.  He lives alone, still able to function at home.  He is able to drink fluids adequately, but not eating food as normal.  He denies fever, or chills, no nausea or vomiting.  His bowel movement has been normal.  All other systems were reviewed with the patient and are negative.  MEDICAL  HISTORY:  Past Medical History:  Diagnosis Date  . Abdominal hernia   . Asthma    mild  . Barrett's esophagus   . Cancer (HCC)    Esophagel cancer  . Cataract    Bil surgery  . Cough    over 2 weeks  . Fluid retention in legs   . GERD (gastroesophageal reflux disease)   . H/O hiatal hernia   . Hypertension   . Myasthenia gravis ([place])   . Sleep apnea    don't use c-pap at home/ tried for several years  . SOB (shortness of breath)     SURGICAL HISTORY: Past Surgical History:  Procedure Laterality Date  . BACK SURGERY     5th lumbar  . BALLOON DILATION N/A 10/19/2016   Procedure: BALLOON DILATION;  Surgeon: Ladene Artist, MD;  Location: [facility];  Service: Endoscopy;  Laterality: N/A;  . BILIARY BRUSHING  05/29/2020   Procedure: BILIARY BRUSHING;  Surgeon: Irene Shipper, MD;  Location: [facility];  Service: Endoscopy;;  . BILIARY STENT PLACEMENT N/A 05/29/2020   Procedure: BILIARY STENT PLACEMENT;  Surgeon: Irene Shipper, MD;  Location: [facility];  Service: Endoscopy;  Laterality: N/A;  . CHOLECYSTECTOMY N/A 04/23/2020   Procedure: LAPAROSCOPIC CHOLECYSTECTOMY WITH INTRAOPERATIVE CHOLANGIOGRAM;  Surgeon: [name], MD;  Location: [facility];  Service: General;  Laterality: N/A;  . COLONOSCOPY W/ POLYPECTOMY    . ERCP N/A 05/29/2020   Procedure: ENDOSCOPIC RETROGRADE CHOLANGIOPANCREATOGRAPHY (ERCP);  Surgeon: Irene Shipper, MD;  Location: [facility];  Service: Endoscopy;  Laterality: N/A;  . ESOPHAGOGASTRODUODENOSCOPY N/A 10/19/2016   Procedure: ESOPHAGOGASTRODUODENOSCOPY (EGD);  Surgeon: Ladene Artist, MD;  Location: [facility];  Service: Endoscopy;  Laterality: N/A;  . ESOPHAGOGASTRODUODENOSCOPY (EGD) WITH PROPOFOL N/A 02/12/2018   Procedure: ESOPHAGOGASTRODUODENOSCOPY (EGD) WITH PROPOFOL;  Surgeon: [name], MD;  Location: [facility];  Service: Gastroenterology;  Laterality: N/A;  . EUS N/A 02/12/2018   Procedure: UPPER ENDOSCOPIC ULTRASOUND  (EUS) RADIAL;  Surgeon: [name], MD;  Location: [facility];  Service: Gastroenterology;  Laterality: N/A;  . EYE SURGERY Bilateral    cataract  . LUMBAR LAMINECTOMY     1980's  . SPHINCTEROTOMY  05/29/2020   Procedure: SPHINCTEROTOMY;  Surgeon: Irene Shipper, MD;  Location: [facility];  Service: Endoscopy;;    I have reviewed the social history and family history with the patient and they are unchanged from previous note.  ALLERGIES:  has No Known Allergies.  MEDICATIONS:  Current Outpatient Medications  Medication Sig Dispense Refill  . albuterol (VENTOLIN HFA) 108 (90 Base) MCG/ACT inhaler Inhale 2 puffs into the lungs every 4 (four) hours as needed for wheezing or shortness of breath.    [name] amitriptyline (ELAVIL) 50 MG tablet Take 50 mg by mouth at bedtime.    [name] aspirin EC 81 MG tablet Take 81 mg by mouth daily.    [name] atorvastatin (LIPITOR) 10 MG tablet Take 10 mg by mouth at bedtime.     [name] etodolac (LODINE) 200 MG capsule Take 200 mg by mouth every 8 (eight) hours as needed for moderate pain.    [name]  famotidine (PEPCID) 20 MG tablet TAKE ONE TABLET BY MOUTH TWICE A DAY (Patient taking differently: Take 20 mg by mouth 2 (two) times daily.) 60 tablet 1  . HYDROcodone-acetaminophen (NORCO) 7.5-325 MG tablet Take 1 tablet by mouth 4 (four) times daily as needed for severe pain. 20 tablet 0  . lisinopril (ZESTRIL) 20 MG tablet Take 20 mg by mouth daily.    . Multiple Vitamin (MULTIVITAMIN) tablet Take 1 tablet by mouth daily.    . mycophenolate (CELLCEPT) 500 MG tablet Take 2 tablets (1,000 mg total) by mouth 2 (two) times daily. 120 tablet 12  . ondansetron (ZOFRAN) 8 MG tablet Take 1 tablet (8 mg total) by mouth 2 (two) times daily as needed for refractory nausea / vomiting. Start on day 3 after chemotherapy. 30 tablet 1  . prochlorperazine (COMPAZINE) 10 MG tablet Take 1 tablet (10 mg total) by mouth every 6 (six) hours as needed (Nausea or vomiting). 30 tablet 1  .  pyridostigmine (MESTINON) 60 MG tablet Take 0.5-1 tablets (30-60 mg total) by mouth 3 (three) times daily. (Patient taking differently: Take 60 mg by mouth in the morning and at bedtime.) 90 tablet 12  . XELODA 500 MG tablet Take 3 tablets (1,500 mg total) by mouth 2 (two) times daily after a meal. Take for 14 days, then hold for 7 days. Repeat every 21 days. (Patient not taking: Reported on 04/21/2020) 84 tablet 2   No current facility-administered medications for this visit.    PHYSICAL EXAMINATION: ECOG PERFORMANCE STATUS: 3 - Symptomatic, >50% confined to bed  Vitals:   06/04/20 1348  BP: 135/84  Pulse: 99  Resp: 13  Temp: 97.6 F (36.4 C)  SpO2: 100%   Filed Weights   06/04/20 1348  Weight: 259 lb 11.2 oz (117.8 kg)    GENERAL:alert, no distress and comfortable SKIN: skin color, texture, turgor are normal, no rashes or significant lesions EYES: normal, Conjunctiva are pink and non-injected, sclera clear NECK: supple, thyroid normal size, non-tender, without nodularity LYMPH:  no palpable lymphadenopathy in the cervical, axillary  LUNGS: clear to auscultation and percussion with normal breathing effort HEART: regular rate & rhythm and no murmurs and no lower extremity edema ABDOMEN:abdomen soft, diffuse tenderness, no rebound pain, normal bowel sounds Musculoskeletal:no cyanosis of digits and no clubbing  NEURO: alert & oriented x 3 with fluent speech, no focal motor/sensory deficits  LABORATORY DATA:  I have reviewed the data as listed CBC Latest Ref Rng & Units 06/04/2020 05/30/2020 05/28/2020  WBC 4.0 - 10.5 K/uL 6.2 5.0 7.8  Hemoglobin 13.0 - 17.0 g/dL 11.1(L) 10.9(L) 10.9(L)  Hematocrit 39.0 - 52.0 % 35.0(L) 32.8(L) 33.0(L)  Platelets 150 - 400 K/uL 301 212 214     CMP Latest Ref Rng & Units 06/04/2020 05/30/2020 05/29/2020  Glucose 70 - 99 mg/dL 127(H) 145(H) 121(H)  BUN 8 - 23 mg/dL _0 Creatinine 0.61 - 1.24 mg/dL 0.64 0.52(L) <0.30(L)  Sodium 135 - 145 mmol/L  136 134(L) 129(L)  Potassium 3.5 - 5.1 mmol/L 3.8 4.0 5.9(H)  Chloride 98 - 111 mmol/L 106 104 102  CO2 22 - 32 mmol/L 22 22 15(L)  Calcium 8.9 - 10.3 mg/dL 10.1 9.9 9.9  Total Protein 6.5 - 8.1 g/dL 5.6(L) 5.4(L) 4.8(L)  Total Bilirubin 0.3 - 1.2 mg/dL 2.1(H) 4.4(H) 8.6(H)  Alkaline Phos 38 - 126 U/L 271(H) 417(H) 478(H)  AST 15 - 41 U/L 42(H) 109(H) 242(H)  ALT 0 - 44 U/L 62(H) 141(H) 191(H)  RADIOGRAPHIC STUDIES: I have personally reviewed the radiological images as listed and agreed with the findings in the report. No results found.   ASSESSMENT & PLAN:  Philip [name] is a 75 y.o. 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 Due to his age and comorbidities, hewas felt to be a poorcandidate for surgery.  -He completed concurrent chemoRT with CT.His post-treatment PET and endoscopy in 12/2018 showed no residual disease. -Unfortunately he either had residual disease vs localearly stage cancer recurrencein 07/08/19. -His [facility] Onetest showed PDL-1 10%+,mutation burden 10, HER-2 mutation(+), no other targeted therapy. Due to his history ofmyasthenia gravis,he is not a candidate for immunotherapy. -I started him onCAPOX q3weeks with oral Xeloda2022m BID2 weeks on/1 week offon4/14/21.He is now on maintenanceXeloda 1500 mg twice daily, 2 weeks on and 1 week offstarting 02/22/20. -Xeloda held after 03/30/20 due pancreatitis and possible gall stone.   He underwent cholecystectomy. -He developed obstructive jaundice, MRI scan showed diffuse retroperitoneal adenopathy, 2 pancreatic lesions, highly suspicious for metastatic disease, or new metastatic pancreatic cancer.  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  -I discussed biopsy of the retroperitoneal node and consider molecular testing, to differentiate if this is metastatic esophageal cancer or a new primary. -We discussed next line chemo for his esophageal cancer. -We discussed overall poor prognosis.  Given his advanced age, limited social support, and medical comorbidities, I think is also reasonable to consider palliative care or hospice alone.  -After lengthy discussion, patient is much more concerned about his pain control, and quality of life, not very interested in prolonging his life with chemotherapy.  He opted hospice home care service.  We will make a urgent referral today -I called in MS contin [id number] for him today, and will titrate his dose as needed.  He has hydrocodone as needed for breakthrough pain.  2Chronic back pain  -managed by pain specialist, on Hydrocodone. Continue to F/u with andorthopedicsurgeon, Dr SaMaia PettiesHe is cleared for steroid injections -I called Dr. SaHenriette Combsffice   3.HTN,Myasthenia gravis -Oncellcept [id number] longer term immunosuppressionand Mestinon.f/u with neurologist and PCP  4. Hypercalcemia,primary hyperparathyroidism -He takes Vitamin D. He is fine to take multivitamin that contains low dose calcium.I advised him to avoid high dose calcium. -Prior labshavesupportedprimary hyperparathyroidism.F/u withPCP about this.  5.Goal of care discussion  -He agreed DNR today. -His POA is his Aunt in [place]   Plan -Pt opted home hospice service, [place] [facility] [place] -I called in [place] today   No problem-specific Assessment & Plan notes found for this encounter.   No orders of the defined types were placed in this encounter.  All questions were answered. The patient knows to call the clinic with any problems, questions or concerns. No barriers to learning was detected. The total time spent in the  appointment was 40 minutes.     YanTruitt MerleD 06/04/2020   I, AmoJoslyn Devonm acting as scribe for YanTruitt MerleD.   I have reviewed the above documentation for accuracy and completeness, and I agree with the above.

## 2020-06-03 NOTE — Progress Notes (Signed)
The proposed treatment discussed in conference is for discussion purposes only and is not a binding recommendation.  The patients have not been physically examined, or presented with their treatment options.  Therefore, final treatment plans cannot be decided.   

## 2020-06-04 ENCOUNTER — Other Ambulatory Visit: Payer: Self-pay

## 2020-06-04 ENCOUNTER — Inpatient Hospital Stay: Payer: Medicare Other | Attending: Nurse Practitioner

## 2020-06-04 ENCOUNTER — Inpatient Hospital Stay (HOSPITAL_BASED_OUTPATIENT_CLINIC_OR_DEPARTMENT_OTHER): Payer: Medicare Other | Admitting: Hematology

## 2020-06-04 VITALS — BP 135/84 | HR 99 | Temp 97.6°F | Resp 13 | Ht 72.0 in | Wt 259.7 lb

## 2020-06-04 DIAGNOSIS — K2270 Barrett's esophagus without dysplasia: Secondary | ICD-10-CM | POA: Diagnosis not present

## 2020-06-04 DIAGNOSIS — C160 Malignant neoplasm of cardia: Secondary | ICD-10-CM | POA: Diagnosis present

## 2020-06-04 DIAGNOSIS — C7802 Secondary malignant neoplasm of left lung: Secondary | ICD-10-CM | POA: Diagnosis not present

## 2020-06-04 DIAGNOSIS — Z66 Do not resuscitate: Secondary | ICD-10-CM | POA: Insufficient documentation

## 2020-06-04 DIAGNOSIS — G893 Neoplasm related pain (acute) (chronic): Secondary | ICD-10-CM | POA: Insufficient documentation

## 2020-06-04 DIAGNOSIS — R1084 Generalized abdominal pain: Secondary | ICD-10-CM

## 2020-06-04 DIAGNOSIS — Z79891 Long term (current) use of opiate analgesic: Secondary | ICD-10-CM | POA: Insufficient documentation

## 2020-06-04 DIAGNOSIS — M549 Dorsalgia, unspecified: Secondary | ICD-10-CM | POA: Diagnosis not present

## 2020-06-04 DIAGNOSIS — Z9221 Personal history of antineoplastic chemotherapy: Secondary | ICD-10-CM | POA: Insufficient documentation

## 2020-06-04 DIAGNOSIS — K219 Gastro-esophageal reflux disease without esophagitis: Secondary | ICD-10-CM | POA: Diagnosis not present

## 2020-06-04 DIAGNOSIS — Z79899 Other long term (current) drug therapy: Secondary | ICD-10-CM | POA: Insufficient documentation

## 2020-06-04 DIAGNOSIS — G7000 Myasthenia gravis without (acute) exacerbation: Secondary | ICD-10-CM | POA: Insufficient documentation

## 2020-06-04 DIAGNOSIS — Z923 Personal history of irradiation: Secondary | ICD-10-CM | POA: Diagnosis not present

## 2020-06-04 DIAGNOSIS — Z7982 Long term (current) use of aspirin: Secondary | ICD-10-CM | POA: Diagnosis not present

## 2020-06-04 LAB — CMP (CANCER CENTER ONLY)
ALT: 62 U/L — ABNORMAL HIGH (ref 0–44)
AST: 42 U/L — ABNORMAL HIGH (ref 15–41)
Albumin: 2.9 g/dL — ABNORMAL LOW (ref 3.5–5.0)
Alkaline Phosphatase: 271 U/L — ABNORMAL HIGH (ref 38–126)
Anion gap: 8 (ref 5–15)
BUN: 9 mg/dL (ref 8–23)
CO2: 22 mmol/L (ref 22–32)
Calcium: 10.1 mg/dL (ref 8.9–10.3)
Chloride: 106 mmol/L (ref 98–111)
Creatinine: 0.64 mg/dL (ref 0.61–1.24)
GFR, Estimated: >60 mL/min (ref >60)
Glucose, Bld: 127 mg/dL — ABNORMAL HIGH (ref 70–99)
Potassium: 3.8 mmol/L (ref 3.5–5.1)
Sodium: 136 mmol/L (ref 135–145)
Total Bilirubin: 2.1 mg/dL — ABNORMAL HIGH (ref 0.3–1.2)
Total Protein: 5.6 g/dL — ABNORMAL LOW (ref 6.5–8.1)

## 2020-06-04 LAB — CBC WITH DIFFERENTIAL (CANCER CENTER ONLY)
Abs Immature Granulocytes: 0.03 10*3/uL (ref 0.00–0.07)
Basophils Absolute: 0.1 10*3/uL (ref 0.0–0.1)
Basophils Relative: 1 %
Eosinophils Absolute: 0.1 10*3/uL (ref 0.0–0.5)
Eosinophils Relative: 2 %
HCT: 35 % — ABNORMAL LOW (ref 39.0–52.0)
Hemoglobin: 11.1 g/dL — ABNORMAL LOW (ref 13.0–17.0)
Immature Granulocytes: 1 %
Lymphocytes Relative: 9 %
Lymphs Abs: 0.6 10*3/uL — ABNORMAL LOW (ref 0.7–4.0)
MCH: 33.3 pg (ref 26.0–34.0)
MCHC: 31.7 g/dL (ref 30.0–36.0)
MCV: 105.1 fL — ABNORMAL HIGH (ref 80.0–100.0)
Monocytes Absolute: 0.8 10*3/uL (ref 0.1–1.0)
Monocytes Relative: 12 %
Neutro Abs: 4.7 10*3/uL (ref 1.7–7.7)
Neutrophils Relative %: 75 %
Platelet Count: 301 10*3/uL (ref 150–400)
RBC: 3.33 MIL/uL — ABNORMAL LOW (ref 4.22–5.81)
RDW: 14.1 % (ref 11.5–15.5)
WBC Count: 6.2 10*3/uL (ref 4.0–10.5)
nRBC: 0 % (ref 0.0–0.2)

## 2020-06-04 LAB — CEA (IN HOUSE-CHCC): CEA (CHCC-In House): 298.02 ng/mL — ABNORMAL HIGH (ref 0.00–5.00)

## 2020-06-04 LAB — LIPASE, BLOOD: Lipase: 36 U/L (ref 11–51)

## 2020-06-04 MED ORDER — MORPHINE SULFATE ER 15 MG PO TBCR: 15.0000 mg | EXTENDED_RELEASE_TABLET | Freq: Two times a day (BID) | ORAL | 0 refills | Status: AC

## 2020-06-04 NOTE — Progress Notes (Signed)
Referral placed with [facility] [place] for hospice services.

## 2020-06-05 ENCOUNTER — Encounter: Payer: Self-pay | Admitting: Hematology

## 2020-06-08 ENCOUNTER — Telehealth: Payer: Self-pay | Admitting: Hematology

## 2020-06-08 NOTE — Telephone Encounter (Signed)
Made no changes to pt's schedule per 2/3 los f/up as needed.

## 2020-06-09 ENCOUNTER — Other Ambulatory Visit: Payer: Self-pay

## 2020-06-09 MED ORDER — FAMOTIDINE 20 MG PO TABS: 20.0000 mg | ORAL_TABLET | Freq: Two times a day (BID) | ORAL | 1 refills | Status: AC

## 2020-06-12 ENCOUNTER — Other Ambulatory Visit: Payer: Medicare Other

## 2020-06-12 ENCOUNTER — Ambulatory Visit: Payer: Medicare Other | Admitting: Hematology

## 2020-06-17 ENCOUNTER — Other Ambulatory Visit: Payer: Medicare Other

## 2020-07-21 ENCOUNTER — Other Ambulatory Visit (HOSPITAL_BASED_OUTPATIENT_CLINIC_OR_DEPARTMENT_OTHER): Payer: Self-pay

## 2020-07-31 DEATH — deceased

## 2020-09-04 IMAGING — CT NM PET TUM IMG INITIAL (PI) SKULL BASE T - THIGH
8 series · 25 of 25 positions shown · non-contrast
Comparison: Weeks

CLINICAL DATA: Initial treatment strategy for esophageal carcinoma.

EXAM:
NUCLEAR MEDICINE PET SKULL BASE TO THIGH
TECHNIQUE: 14.9 mCi F-18 FDG was injected intravenously. Full-ring PET imaging
was performed from the skull base to thigh after the radiotracer. CT
data was obtained and used for attenuation correction and anatomic
localization.
Fasting blood glucose: 106 mg/dl

[Series 3: pet sk_thigh ac · axial · 5.0mm · 4.07mm/px · z∈[-858,+142]mm · 5 of 251 slices shown]
[im 1/251]
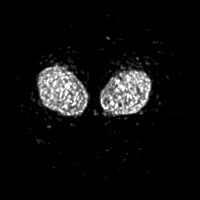
[im 63/251]
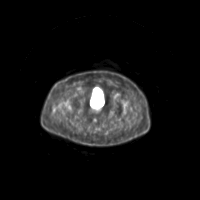
[im 126/251]
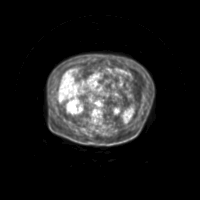
[im 188/251]
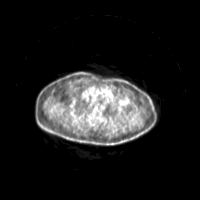
[im 251/251]
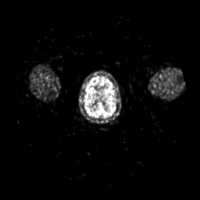

[Series 4: ct sk_thigh 5.0 b31f · axial · 5.0mm · 0.98mm/px · z∈[-858,+142]mm · 5 of 251 slices shown]
[im 1/251]
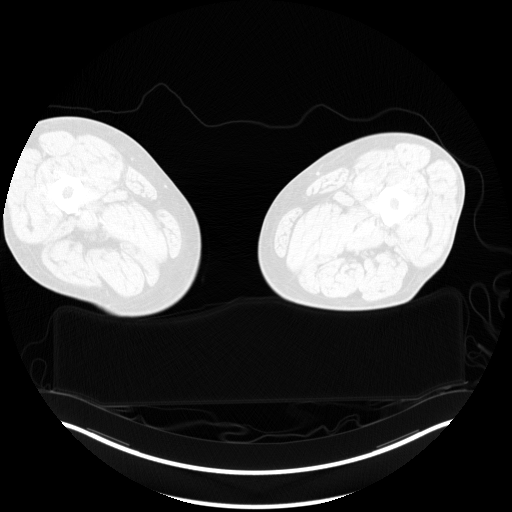
[im 63/251]
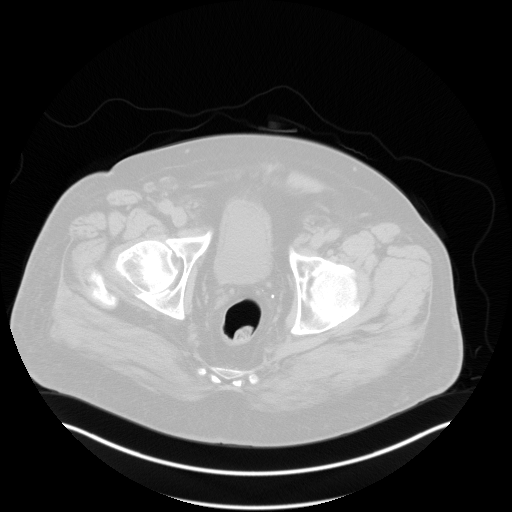
[im 126/251]
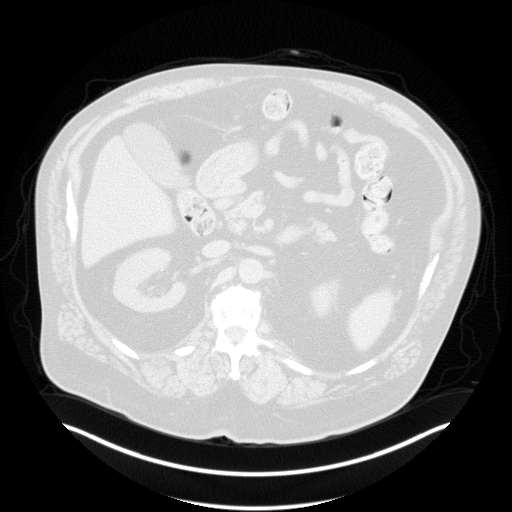
[im 188/251]
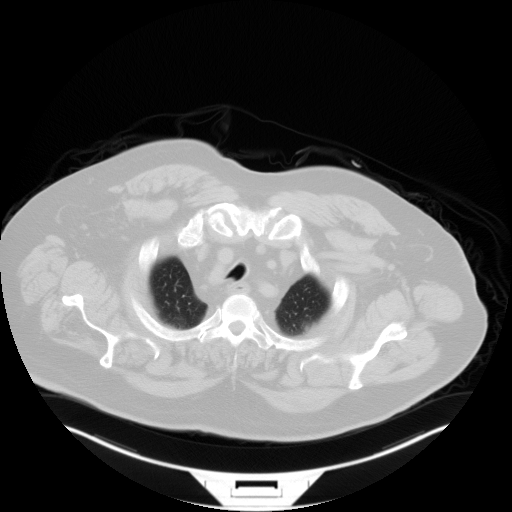
[im 251/251  brain]
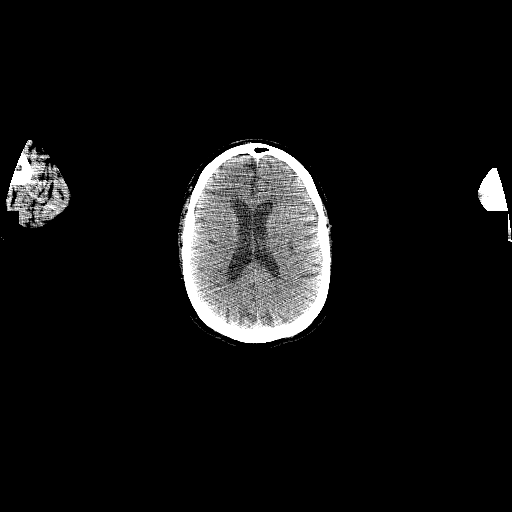

[Series 5: pet sk_thigh nac · axial · 5.0mm · 4.07mm/px · z∈[-858,+142]mm · 5 of 251 slices shown]
[im 1/251]
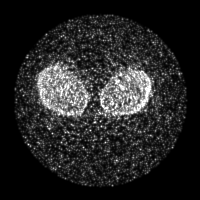
[im 63/251]
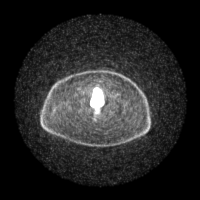
[im 126/251]
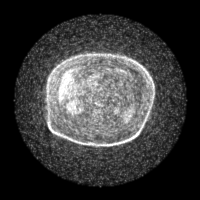
[im 188/251]
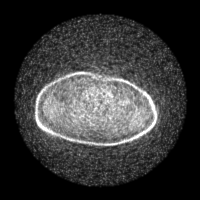
[im 251/251]
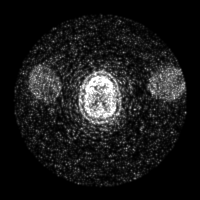

[Series 8: ct sk_thigh 5.0 b70f lung_bone · axial · 5.0mm · 0.82mm/px · 1 of 71 slices shown]
[im 1/71  bone]
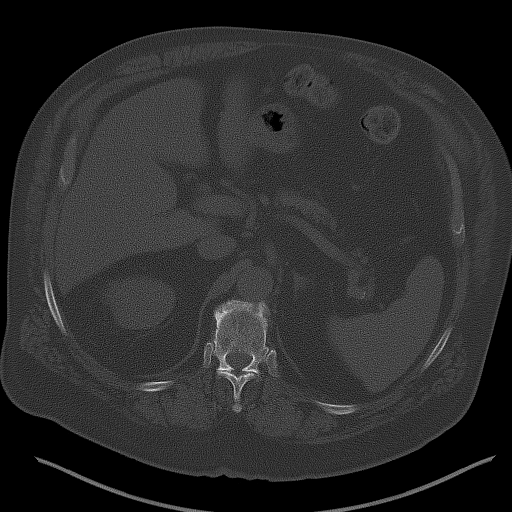

[Series 603: range-ct sk_thigh 5.0 (id)<alpha range> · 2 of 85 slices shown (1 of 2)]
[im 1/85]
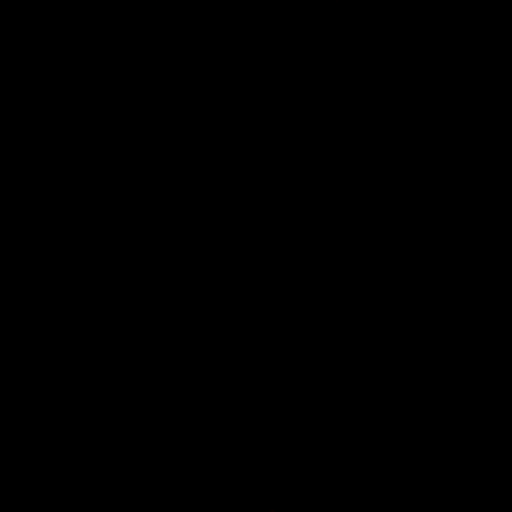
[im 85/85]
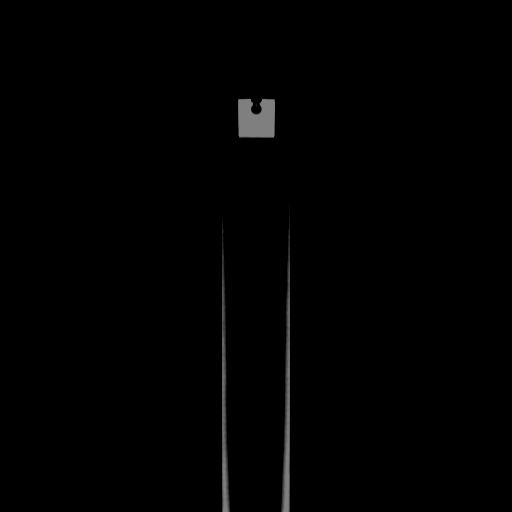

[Series 604: mip range · coronal · 2.07mm/px · 1 of 32 slices shown]
[im 1/32]
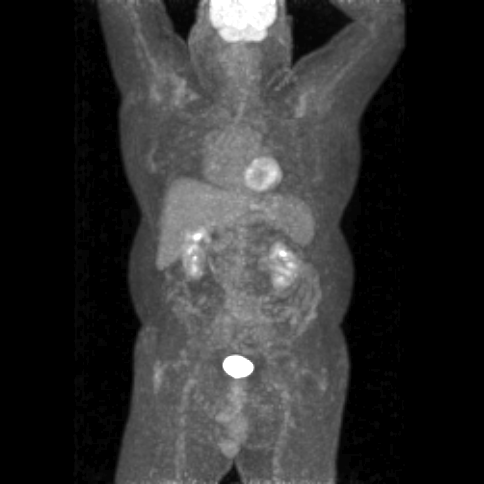

[Series 605: range-ct sk_thigh 5.0 (id)<alpha range> · 5 of 244 slices shown (2 of 2)]
[im 1/244]
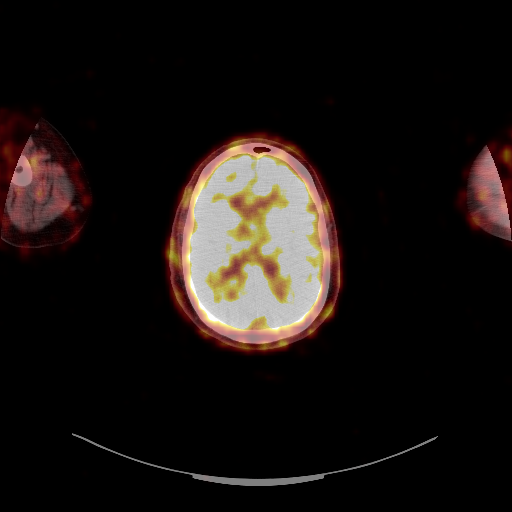
[im 61/244]
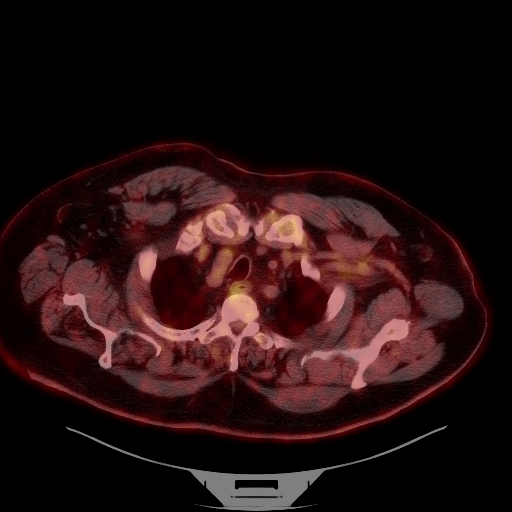
[im 122/244]
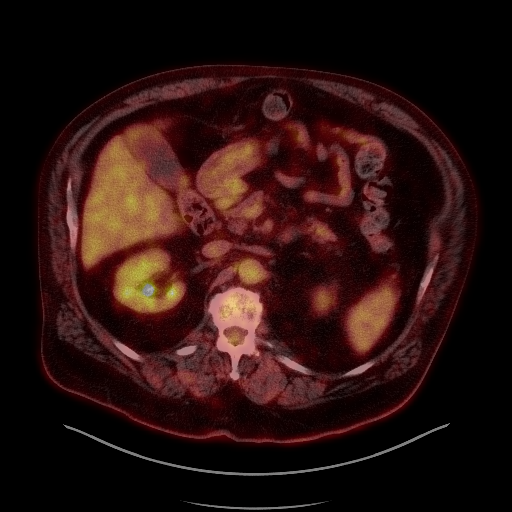
[im 183/244]
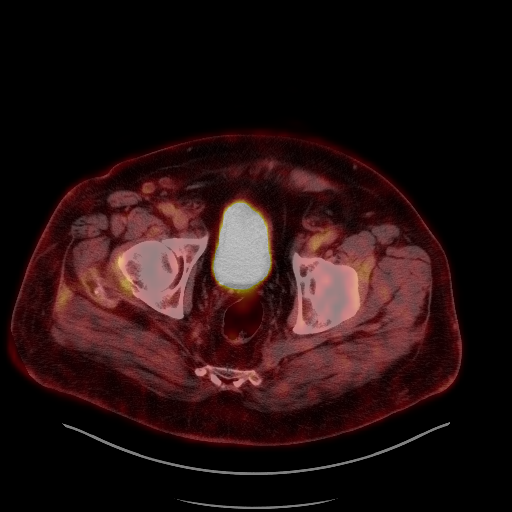
[im 244/244]
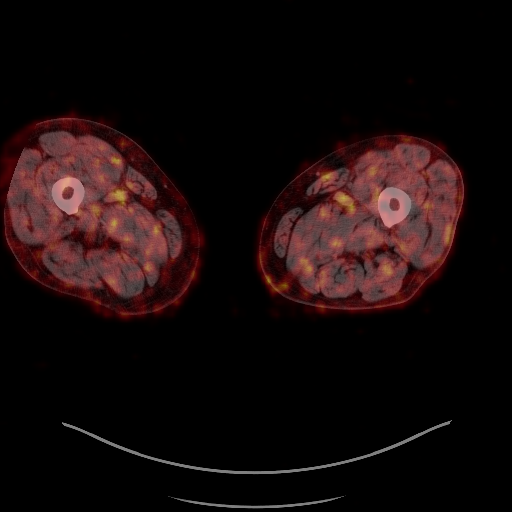

[Series 1082: results mm oncology reading · 1.0mm · 1.01mm/px · 1 of 2 slices shown]
[im 1/2]
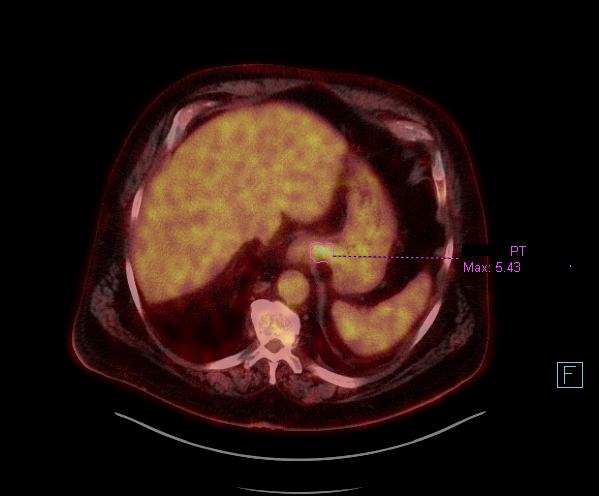

[25 of 25 positions shown; findings below may reference images not displayed]

FINDINGS: Mediastinal blood pool activity: SUV max

NECK: No hypermetabolic lymph nodes in the neck.

Incidental CT findings: none

CHEST: No hypermetabolic mediastinal or hilar nodes. No suspicious
pulmonary nodules on the CT scan.

Incidental CT findings: none

ABDOMEN/PELVIS: Small focus of hypermetabolic activity at the GE
junction with SUV max equal 5.4. No mass lesion identified.

No hypermetabolic lymph nodes in the gastrohepatic ligament. No
abnormal activity in the liver.

No hypermetabolic abdominopelvic lymph nodes. No hypermetabolic
mediastinal nodes.

Incidental CT findings: none

SKELETON:

Incidental CT findings: none
IMPRESSION: 1. Hypermetabolic focus at the GE junction consistent with know
adenocarcinoma.
2. No evidence of metastatic adenopathy in the mediastinum or upper
abdomen.
3. No evidence of liver metastasis.  No pulmonary metastasis.

## 2020-10-05 ENCOUNTER — Encounter: Payer: Self-pay | Admitting: Hematology

## 2020-10-12 ENCOUNTER — Telehealth: Payer: Self-pay | Admitting: *Deleted

## 2020-10-12 ENCOUNTER — Encounter: Payer: Self-pay | Admitting: Diagnostic Neuroimaging

## 2020-10-12 ENCOUNTER — Ambulatory Visit: Payer: Medicare Other | Admitting: Diagnostic Neuroimaging

## 2020-10-12 NOTE — Telephone Encounter (Signed)
Patient was no show for FU appointment today.

## 2021-11-22 ENCOUNTER — Other Ambulatory Visit: Payer: Self-pay

## 2023-08-11 ENCOUNTER — Other Ambulatory Visit: Payer: Self-pay

## 2023-08-14 ENCOUNTER — Other Ambulatory Visit: Payer: Self-pay
